# Patient Record
Sex: Female | Born: 1965
Health system: Southern US, Community
[De-identification: ages and names within clinical notes are randomized; demographics above are authoritative.]

## PROBLEM LIST (undated history)

## (undated) DIAGNOSIS — G5601 Carpal tunnel syndrome, right upper limb: Secondary | ICD-10-CM

## (undated) DIAGNOSIS — I517 Cardiomegaly: Secondary | ICD-10-CM

## (undated) DIAGNOSIS — J449 Chronic obstructive pulmonary disease, unspecified: Secondary | ICD-10-CM

## (undated) DIAGNOSIS — I10 Essential (primary) hypertension: Secondary | ICD-10-CM

## (undated) DIAGNOSIS — E119 Type 2 diabetes mellitus without complications: Secondary | ICD-10-CM

## (undated) DIAGNOSIS — IMO0001 Reserved for inherently not codable concepts without codable children: Secondary | ICD-10-CM

## (undated) DIAGNOSIS — E785 Hyperlipidemia, unspecified: Secondary | ICD-10-CM

## (undated) DIAGNOSIS — I739 Peripheral vascular disease, unspecified: Secondary | ICD-10-CM

## (undated) DIAGNOSIS — J40 Bronchitis, not specified as acute or chronic: Secondary | ICD-10-CM

## (undated) DIAGNOSIS — E669 Obesity, unspecified: Secondary | ICD-10-CM

## (undated) DIAGNOSIS — G4730 Sleep apnea, unspecified: Secondary | ICD-10-CM

## (undated) DIAGNOSIS — M1990 Unspecified osteoarthritis, unspecified site: Secondary | ICD-10-CM

## (undated) DIAGNOSIS — K047 Periapical abscess without sinus: Secondary | ICD-10-CM

## (undated) DIAGNOSIS — D649 Anemia, unspecified: Secondary | ICD-10-CM

## (undated) HISTORY — DX: Chronic obstructive pulmonary disease, unspecified: J44.9

## (undated) HISTORY — DX: Hyperlipidemia, unspecified: E78.5

## (undated) HISTORY — DX: Sleep apnea, unspecified: G47.30

## (undated) HISTORY — PX: DILATION AND CURETTAGE OF UTERUS: SHX78

## (undated) HISTORY — PX: TUBAL LIGATION: SHX77

## (undated) HISTORY — DX: Unspecified osteoarthritis, unspecified site: M19.90

## (undated) HISTORY — DX: Periapical abscess without sinus: K04.7

## (undated) HISTORY — DX: Obesity, unspecified: E66.9

## (undated) HISTORY — DX: Type 2 diabetes mellitus without complications: E11.9

---

## 1997-10-10 ENCOUNTER — Ambulatory Visit (HOSPITAL_COMMUNITY): Admission: RE | Admit: 1997-10-10 | Discharge: 1997-10-10 | Payer: Self-pay | Admitting: Family Medicine

## 1998-03-21 ENCOUNTER — Other Ambulatory Visit: Admission: RE | Admit: 1998-03-21 | Discharge: 1998-03-21 | Payer: Self-pay | Admitting: Internal Medicine

## 1998-04-21 ENCOUNTER — Ambulatory Visit (HOSPITAL_COMMUNITY): Admission: RE | Admit: 1998-04-21 | Discharge: 1998-04-21 | Payer: Self-pay | Admitting: *Deleted

## 1998-10-28 ENCOUNTER — Ambulatory Visit (HOSPITAL_COMMUNITY): Admission: RE | Admit: 1998-10-28 | Discharge: 1998-10-28 | Payer: Self-pay | Admitting: Family Medicine

## 1999-03-27 ENCOUNTER — Encounter: Payer: Self-pay | Admitting: Family Medicine

## 1999-03-27 ENCOUNTER — Ambulatory Visit (HOSPITAL_COMMUNITY): Admission: RE | Admit: 1999-03-27 | Discharge: 1999-03-27 | Payer: Self-pay | Admitting: Family Medicine

## 1999-04-13 ENCOUNTER — Ambulatory Visit (HOSPITAL_COMMUNITY): Admission: RE | Admit: 1999-04-13 | Discharge: 1999-04-13 | Payer: Self-pay | Admitting: Internal Medicine

## 1999-04-13 ENCOUNTER — Encounter: Payer: Self-pay | Admitting: Internal Medicine

## 1999-04-14 ENCOUNTER — Encounter: Payer: Self-pay | Admitting: Internal Medicine

## 1999-06-05 ENCOUNTER — Other Ambulatory Visit: Admission: RE | Admit: 1999-06-05 | Discharge: 1999-06-05 | Payer: Self-pay | Admitting: Family Medicine

## 2002-01-03 ENCOUNTER — Encounter: Payer: Self-pay | Admitting: Emergency Medicine

## 2002-01-03 ENCOUNTER — Emergency Department (HOSPITAL_COMMUNITY): Admission: EM | Admit: 2002-01-03 | Discharge: 2002-01-03 | Payer: Self-pay | Admitting: Emergency Medicine

## 2002-07-27 ENCOUNTER — Ambulatory Visit (HOSPITAL_COMMUNITY): Admission: RE | Admit: 2002-07-27 | Discharge: 2002-07-27 | Payer: Self-pay | Admitting: *Deleted

## 2002-07-27 ENCOUNTER — Encounter: Payer: Self-pay | Admitting: *Deleted

## 2002-09-28 ENCOUNTER — Ambulatory Visit (HOSPITAL_COMMUNITY): Admission: RE | Admit: 2002-09-28 | Discharge: 2002-09-28 | Payer: Self-pay | Admitting: Family Medicine

## 2002-12-03 ENCOUNTER — Encounter: Admission: RE | Admit: 2002-12-03 | Discharge: 2003-03-03 | Payer: Self-pay | Admitting: Internal Medicine

## 2004-11-07 ENCOUNTER — Emergency Department (HOSPITAL_COMMUNITY): Admission: EM | Admit: 2004-11-07 | Discharge: 2004-11-07 | Payer: Self-pay | Admitting: Family Medicine

## 2004-11-11 ENCOUNTER — Emergency Department (HOSPITAL_COMMUNITY): Admission: EM | Admit: 2004-11-11 | Discharge: 2004-11-11 | Payer: Self-pay | Admitting: Family Medicine

## 2005-03-04 ENCOUNTER — Emergency Department (HOSPITAL_COMMUNITY): Admission: EM | Admit: 2005-03-04 | Discharge: 2005-03-04 | Payer: Self-pay | Admitting: Emergency Medicine

## 2005-12-01 ENCOUNTER — Ambulatory Visit (HOSPITAL_COMMUNITY): Admission: RE | Admit: 2005-12-01 | Discharge: 2005-12-01 | Payer: Self-pay | Admitting: Family Medicine

## 2006-04-17 ENCOUNTER — Emergency Department (HOSPITAL_COMMUNITY): Admission: EM | Admit: 2006-04-17 | Discharge: 2006-04-17 | Payer: Self-pay | Admitting: Family Medicine

## 2006-10-14 ENCOUNTER — Ambulatory Visit: Payer: Self-pay | Admitting: Nurse Practitioner

## 2006-10-17 ENCOUNTER — Ambulatory Visit: Payer: Self-pay | Admitting: Nurse Practitioner

## 2006-10-19 ENCOUNTER — Ambulatory Visit (HOSPITAL_COMMUNITY): Admission: RE | Admit: 2006-10-19 | Discharge: 2006-10-19 | Payer: Self-pay | Admitting: Family Medicine

## 2006-10-24 ENCOUNTER — Encounter (INDEPENDENT_AMBULATORY_CARE_PROVIDER_SITE_OTHER): Payer: Self-pay | Admitting: Nurse Practitioner

## 2006-10-24 ENCOUNTER — Ambulatory Visit: Payer: Self-pay | Admitting: Nurse Practitioner

## 2006-12-05 ENCOUNTER — Ambulatory Visit (HOSPITAL_COMMUNITY): Admission: RE | Admit: 2006-12-05 | Discharge: 2006-12-05 | Payer: Self-pay | Admitting: Family Medicine

## 2007-08-01 ENCOUNTER — Emergency Department (HOSPITAL_COMMUNITY): Admission: EM | Admit: 2007-08-01 | Discharge: 2007-08-01 | Payer: Self-pay | Admitting: Emergency Medicine

## 2008-01-01 ENCOUNTER — Ambulatory Visit (HOSPITAL_COMMUNITY): Admission: RE | Admit: 2008-01-01 | Discharge: 2008-01-01 | Payer: Self-pay | Admitting: Family Medicine

## 2008-01-24 ENCOUNTER — Encounter: Admission: RE | Admit: 2008-01-24 | Discharge: 2008-01-24 | Payer: Self-pay | Admitting: Family Medicine

## 2008-06-18 ENCOUNTER — Emergency Department (HOSPITAL_COMMUNITY): Admission: EM | Admit: 2008-06-18 | Discharge: 2008-06-18 | Payer: Self-pay | Admitting: Emergency Medicine

## 2008-07-29 IMAGING — CR DG UGI W/ KUB
1 series · 1 of 1 positions shown · non-contrast
Comparison: none

CLINICAL DATA: Epigastric pain. 
UPPER GI SERIES WITH KUB:

[view not recorded]
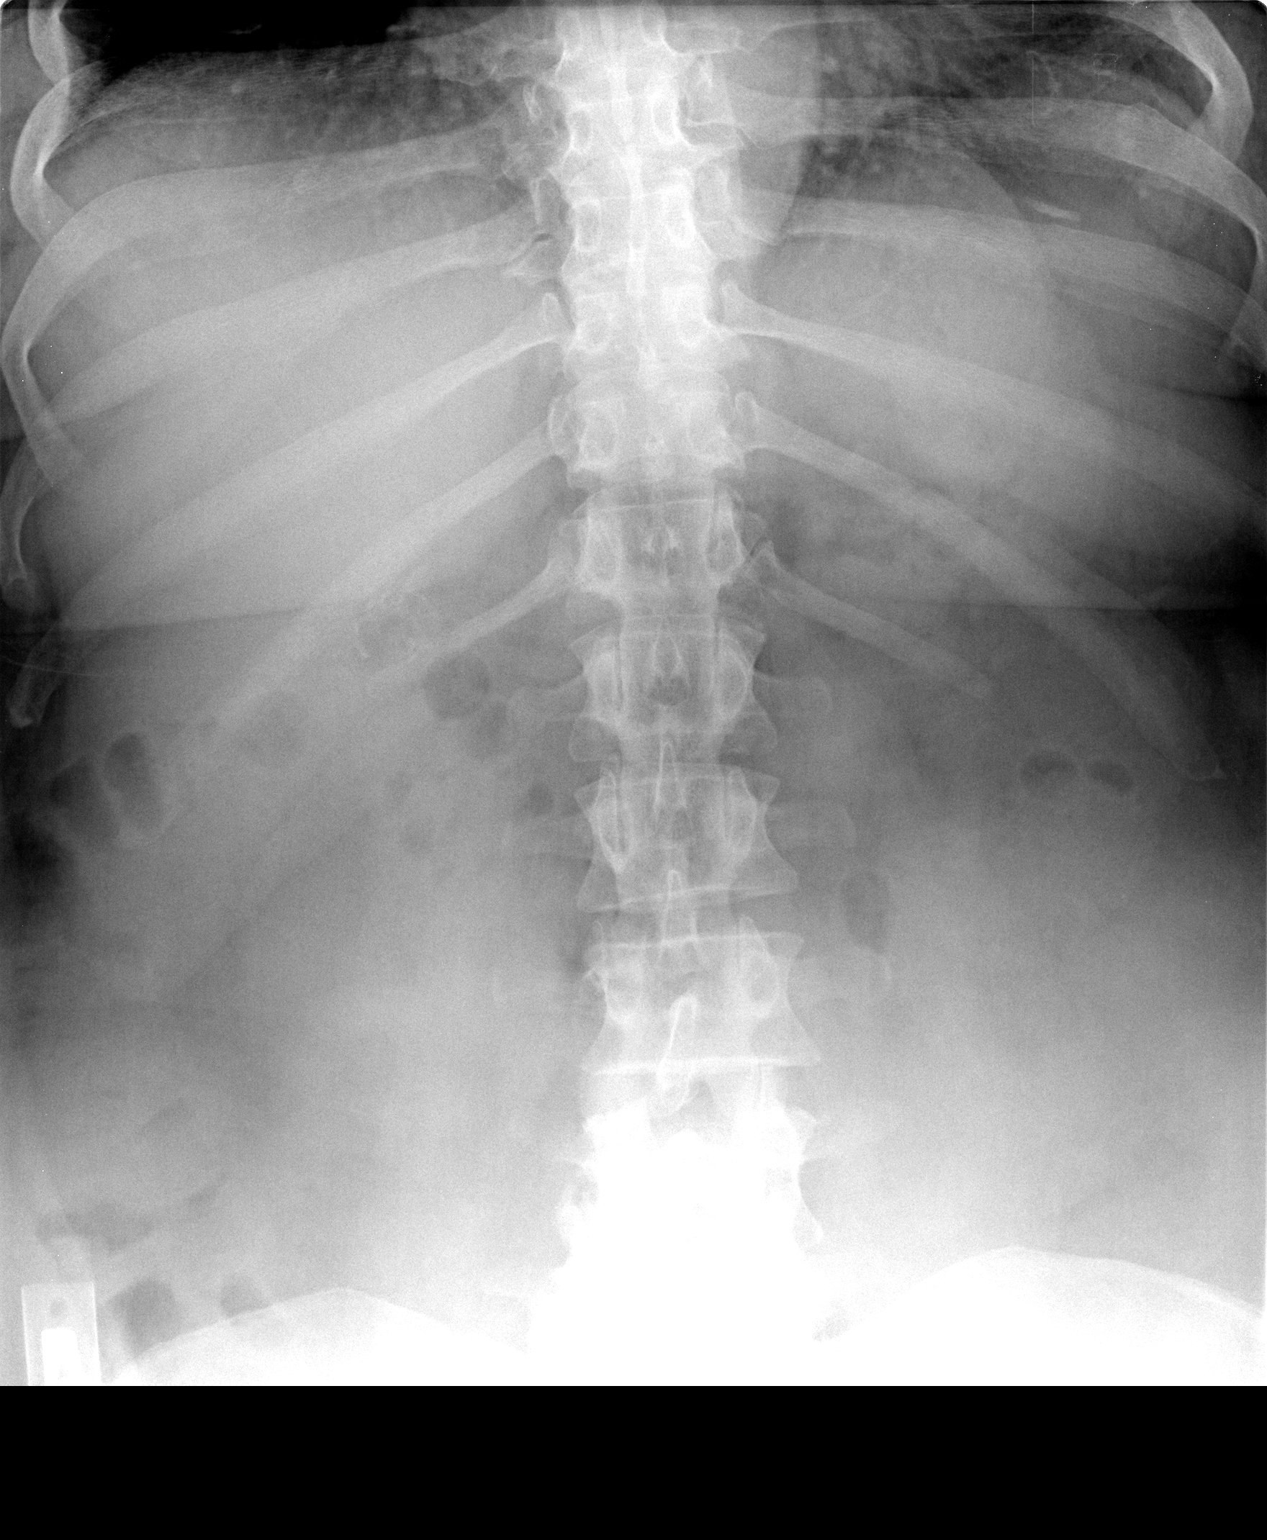

[1 of 1 positions shown; findings below may reference images not displayed]

FINDINGS: Preliminary scout film is unremarkable.  Esophageal motility and contour are normal.  No hiatal hernia or GE reflux.  There was some spasm of the proximal duodenum including the bulb and descending duodenum and the mucosal folds appear somewhat prominent in caliber as well.  Findings are compatible with peptic duodenitis.  Negative for ulcer.  Gastric mucosal folds appear somewhat prominent in caliber potentially representing gastritis, although the caliber of the folds may be within the range of normal.  No ulcer niche.
IMPRESSION: Findings compatible with peptic duodenitis and possibly gastritis as well.  No ulcer.

## 2008-08-19 ENCOUNTER — Encounter (INDEPENDENT_AMBULATORY_CARE_PROVIDER_SITE_OTHER): Payer: Self-pay | Admitting: Internal Medicine

## 2008-08-19 ENCOUNTER — Ambulatory Visit: Payer: Self-pay | Admitting: *Deleted

## 2008-08-19 ENCOUNTER — Ambulatory Visit: Payer: Self-pay | Admitting: Internal Medicine

## 2008-08-19 LAB — CONVERTED CEMR LAB
ALT: 19 units/L (ref 0–35)
Albumin: 3.8 g/dL (ref 3.5–5.2)
Alkaline Phosphatase: 116 units/L (ref 39–117)
CO2: 23 meq/L (ref 19–32)
Glucose, Bld: 104 mg/dL — ABNORMAL HIGH (ref 70–99)
Hemoglobin: 13.9 g/dL (ref 12.0–15.0)
Lymphocytes Relative: 39 % (ref 12–46)
Lymphs Abs: 3.5 10*3/uL (ref 0.7–4.0)
Monocytes Absolute: 0.5 10*3/uL (ref 0.1–1.0)
Monocytes Relative: 5 % (ref 3–12)
Neutro Abs: 4.6 10*3/uL (ref 1.7–7.7)
Potassium: 4.2 meq/L (ref 3.5–5.3)
RBC: 4.91 M/uL (ref 3.87–5.11)
Sodium: 139 meq/L (ref 135–145)
TSH: 3.031 microintl units/mL (ref 0.350–4.500)
Total Protein: 7.3 g/dL (ref 6.0–8.3)
WBC: 8.9 10*3/uL (ref 4.0–10.5)

## 2008-08-21 ENCOUNTER — Encounter: Admission: RE | Admit: 2008-08-21 | Discharge: 2008-08-21 | Payer: Self-pay | Admitting: Internal Medicine

## 2008-08-29 ENCOUNTER — Encounter (INDEPENDENT_AMBULATORY_CARE_PROVIDER_SITE_OTHER): Payer: Self-pay | Admitting: *Deleted

## 2008-09-25 ENCOUNTER — Ambulatory Visit: Payer: Self-pay | Admitting: Internal Medicine

## 2008-09-27 ENCOUNTER — Encounter (INDEPENDENT_AMBULATORY_CARE_PROVIDER_SITE_OTHER): Payer: Self-pay | Admitting: *Deleted

## 2009-02-07 ENCOUNTER — Telehealth (INDEPENDENT_AMBULATORY_CARE_PROVIDER_SITE_OTHER): Payer: Self-pay | Admitting: *Deleted

## 2009-02-12 ENCOUNTER — Ambulatory Visit: Payer: Self-pay | Admitting: Family Medicine

## 2009-08-22 ENCOUNTER — Ambulatory Visit (HOSPITAL_COMMUNITY): Admission: RE | Admit: 2009-08-22 | Discharge: 2009-08-22 | Payer: Self-pay | Admitting: Internal Medicine

## 2009-09-17 ENCOUNTER — Ambulatory Visit: Payer: Self-pay | Admitting: Family Medicine

## 2009-09-22 ENCOUNTER — Ambulatory Visit (HOSPITAL_COMMUNITY): Admission: RE | Admit: 2009-09-22 | Discharge: 2009-09-22 | Payer: Self-pay | Admitting: Internal Medicine

## 2009-10-08 ENCOUNTER — Ambulatory Visit: Payer: Self-pay | Admitting: Internal Medicine

## 2009-11-26 ENCOUNTER — Ambulatory Visit: Payer: Self-pay | Admitting: Internal Medicine

## 2009-11-26 LAB — CONVERTED CEMR LAB
AST: 12 units/L (ref 0–37)
Albumin: 3.7 g/dL (ref 3.5–5.2)
BUN: 13 mg/dL (ref 6–23)
CO2: 22 meq/L (ref 19–32)
Calcium: 9 mg/dL (ref 8.4–10.5)
Chloride: 104 meq/L (ref 96–112)
Cholesterol: 169 mg/dL (ref 0–200)
Creatinine, Ser: 0.64 mg/dL (ref 0.40–1.20)
Eosinophils Absolute: 0.4 10*3/uL (ref 0.0–0.7)
Eosinophils Relative: 6 % — ABNORMAL HIGH (ref 0–5)
Glucose, Bld: 80 mg/dL (ref 70–99)
HCT: 41.4 % (ref 36.0–46.0)
HDL: 42 mg/dL (ref >39)
Hemoglobin: 13.7 g/dL (ref 12.0–15.0)
Lymphocytes Relative: 40 % (ref 12–46)
Lymphs Abs: 2.4 10*3/uL (ref 0.7–4.0)
MCV: 86.6 fL (ref 78.0–100.0)
Monocytes Absolute: 0.5 10*3/uL (ref 0.1–1.0)
Platelets: 277 10*3/uL (ref 150–400)
Potassium: 4.1 meq/L (ref 3.5–5.3)
TSH: 3.042 microintl units/mL (ref 0.350–4.500)
Total CHOL/HDL Ratio: 4
Triglycerides: 75 mg/dL (ref <150)
WBC: 6.2 10*3/uL (ref 4.0–10.5)

## 2009-12-16 ENCOUNTER — Ambulatory Visit: Payer: Self-pay | Admitting: Internal Medicine

## 2010-06-06 ENCOUNTER — Emergency Department (HOSPITAL_COMMUNITY)
Admission: EM | Admit: 2010-06-06 | Discharge: 2010-06-06 | Payer: Self-pay | Source: Home / Self Care | Admitting: Emergency Medicine

## 2010-06-29 ENCOUNTER — Encounter: Payer: Self-pay | Admitting: Family Medicine

## 2010-08-17 LAB — GC/CHLAMYDIA PROBE AMP, GENITAL
Chlamydia, DNA Probe: NEGATIVE
GC Probe Amp, Genital: NEGATIVE

## 2010-08-17 LAB — POCT URINALYSIS DIPSTICK
Bilirubin Urine: NEGATIVE
Hgb urine dipstick: NEGATIVE
Ketones, ur: NEGATIVE mg/dL
Protein, ur: NEGATIVE mg/dL
Specific Gravity, Urine: 1.025 (ref 1.005–1.030)
pH: 6 (ref 5.0–8.0)

## 2010-08-17 LAB — WET PREP, GENITAL

## 2010-08-17 LAB — POCT PREGNANCY, URINE: Preg Test, Ur: NEGATIVE

## 2010-08-19 LAB — HM PAP SMEAR: HM Pap smear: NORMAL

## 2011-01-11 ENCOUNTER — Encounter: Payer: Self-pay | Admitting: *Deleted

## 2011-01-12 ENCOUNTER — Ambulatory Visit (HOSPITAL_COMMUNITY): Payer: BC Managed Care – PPO | Attending: Cardiology | Admitting: Radiology

## 2011-01-12 DIAGNOSIS — R0789 Other chest pain: Secondary | ICD-10-CM

## 2011-01-12 DIAGNOSIS — R0609 Other forms of dyspnea: Secondary | ICD-10-CM

## 2011-01-12 DIAGNOSIS — I4949 Other premature depolarization: Secondary | ICD-10-CM

## 2011-01-12 DIAGNOSIS — R079 Chest pain, unspecified: Secondary | ICD-10-CM | POA: Insufficient documentation

## 2011-01-12 DIAGNOSIS — R0989 Other specified symptoms and signs involving the circulatory and respiratory systems: Secondary | ICD-10-CM

## 2011-01-12 MED ORDER — TECHNETIUM TC 99M TETROFOSMIN IV KIT
33.0000 | PACK | Freq: Once | INTRAVENOUS | Status: AC | PRN
  Administered 2011-01-12: 33 via INTRAVENOUS

## 2011-01-12 NOTE — Progress Notes (Signed)
[facility] SITE 3 NUCLEAR MED [address] [place] [address] [phone]  Cardiology Nuclear Med Study  Lorraine Holt is a 45 y.o. female [id number] 08-22-1965   Nuclear Med Background Indication for Stress Test:  Evaluation for Ischemia History:  No previous documented CAD Cardiac Risk Factors: Family History - CAD, Hypertension, NIDDM, Obesity and Smoker  Symptoms: Chest pain/ Chest Tightness with and without Exertion (last date of chest discomfort at present subsided after 1 minute), Diaphoresis, Dizziness, Fatigue, Fatigue with Exertion, Light-Headedness, Nausea, Near Syncope, Palpitations and SOB   Nuclear Pre-Procedure Caffeine/Decaff Intake:  None NPO After: 500pm   Lungs:  Clear IV 0.9% NS with Angio Cath:  20g  IV Site: R Wrist  IV Started by:  Cathlyn Parsons, RN  Chest Size (in):  32 Cup Size: D  Height: 5\' 7"  (1.702 m)  Weight:  291 lb (131.997 kg)  BMI:  Body mass index is 45.58 kg/(m^2). Tech Comments:  na    Nuclear Med Study 1 or 2 day study: 2 day  Stress Test Type:  Stress  Reading MD: Willa Rough MD  Order Authorizing Provider:  Georganna Skeans, MD  Resting Radionuclide: Technetium 66m Tetrofosmin  Resting Radionuclide Dose: 33 mCi   Stress Radionuclide:  Technetium 46m Tetrofosmin  Stress Radionuclide Dose: 33 mCi           Stress Protocol Rest HR: 71 Stress HR: 148  Rest BP: 105/79 Stress BP: 170/94  Exercise Time (min): 6:01 METS: 7.0   Predicted Max HR: 173 bpm % Max HR: 85.55 bpm Rate Pressure Product: 10272   Dose of Adenosine (mg):  n/a Dose of Lexiscan: n/a mg  Dose of Atropine (mg): n/a Dose of Dobutamine: n/a mcg/kg/min (at max HR)  Stress Test Technologist: Irean Hong, RN  Nuclear Technologist:  Domenic Polite, CNMT     Rest Procedure:  Myocardial perfusion imaging was performed at rest 45 minutes following the intravenous administration of Technetium 1m Tetrofosmin. Rest ECG: NSR, Bigeminy PVC's, PJC's,  and nonconducted PJC's.  Stress Procedure:  The patient exercised for 6 minutes and 01 second, RPE=15. The patient stopped due to DOE, and Fatigue and denied any chest pain.  There were no significant ST-T wave changes. There were frequent PJC's, and rare PVC.  Technetium 69m Tetrofosmin was injected at peak exercise and myocardial perfusion imaging was performed after a brief delay. Stress ECG: No significant change from baseline ECG  QPS Raw Data Images:  Patient motion noted; appropriate software correction applied. Stress Images:  The images are noisey, but there is no diagnostic abnormality. Rest Images:  Same as stress Subtraction (SDS):  No evidence of ischemia. Transient Ischemic Dilatation (Normal <1.22):  1.29  Lung/Heart Ratio (Normal <0.45):  .39  Quantitative Gated Spect Images QGS EDV:  126 ml QGS ESV:  64 ml QGS cine images:  No sure how reliable the LV function analysis is on this study. QGS EF: 49%  Impression Exercise Capacity:  Fair exercise capacity. BP Response:  Normal blood pressure response. Clinical Symptoms:  DOE ECG Impression:  No significant ST segment change suggestive of ischemia. Comparison with Prior Nuclear Study: No previous nuclear study performed  Overall Impression:  Normal stress nuclear study.  Willa Rough

## 2011-01-13 ENCOUNTER — Ambulatory Visit (HOSPITAL_COMMUNITY): Payer: BC Managed Care – PPO | Attending: Family Medicine | Admitting: Radiology

## 2011-01-13 DIAGNOSIS — R0989 Other specified symptoms and signs involving the circulatory and respiratory systems: Secondary | ICD-10-CM

## 2011-01-13 MED ORDER — TECHNETIUM TC 99M TETROFOSMIN IV KIT
33.0000 | PACK | Freq: Once | INTRAVENOUS | Status: AC | PRN
  Administered 2011-01-13: 33 via INTRAVENOUS

## 2011-01-14 NOTE — Progress Notes (Signed)
nuc med report routed to Georganna Skeans 01/14/11 Domenic Polite

## 2011-12-29 ENCOUNTER — Emergency Department (HOSPITAL_COMMUNITY)
Admission: EM | Admit: 2011-12-29 | Discharge: 2011-12-29 | Disposition: A | Discharge status: Home / Self Care | Payer: 59 | Source: Home / Self Care | Attending: Family Medicine | Admitting: Family Medicine

## 2011-12-29 ENCOUNTER — Encounter (HOSPITAL_COMMUNITY): Payer: Self-pay

## 2011-12-29 DIAGNOSIS — H01009 Unspecified blepharitis unspecified eye, unspecified eyelid: Secondary | ICD-10-CM

## 2011-12-29 DIAGNOSIS — H01006 Unspecified blepharitis left eye, unspecified eyelid: Secondary | ICD-10-CM

## 2011-12-29 HISTORY — DX: Essential (primary) hypertension: I10

## 2011-12-29 MED ORDER — PREDNISONE 20 MG PO TABS: ORAL_TABLET | ORAL | Status: AC

## 2011-12-29 MED ORDER — HYDROXYZINE HCL 25 MG PO TABS: 25.0000 mg | ORAL_TABLET | Freq: Four times a day (QID) | ORAL | Status: AC | PRN

## 2011-12-29 MED ORDER — ERYTHROMYCIN 5 MG/GM OP OINT: TOPICAL_OINTMENT | OPHTHALMIC | Status: AC

## 2011-12-29 NOTE — ED Notes (Signed)
C/o lt upper eyelid redness, swelling and itching.  States eye tearing frequently.  Sx started yesterday.  BP elevated here today, has not taken her BP med in 2 days.

## 2011-12-30 NOTE — ED Provider Notes (Signed)
History     CSN: [id number]  Arrival date & time 12/29/11  1110   First MD Initiated Contact with Patient 12/29/11 1126      Chief Complaint  Patient presents with  . Eye Problem    (Consider location/radiation/quality/duration/timing/severity/associated sxs/prior treatment) HPI Comments: 46 year old obese female with history of hypertension diabetes. Here complaining of left upper eyelid swelling and itchiness for 24 hours. Symptoms associated with constant eye tearing. Not known traumas. Denies foreign body sensation. Patient does not wear contacts. Denies eye pain. Not known exposures. Not taking any medications for her symptoms. On the other hand patient was found hypotensive with a systolic blood pressure in the 170 and diastolic 118. She states that she has not taken her blood pressure medications in the last 2 days sometimes she forgets take them before leaving the house in the morning. Denies headache, visual changes, chest pain, shortness of breath. Patient reports well other than the symptoms stated above.   Past Medical History  Diagnosis Date  . Hypertension   . Diabetes mellitus     Past Surgical History  Procedure Date  . Tubal ligation     No family history on file.  History  Substance Use Topics  . Smoking status: Current Everyday Smoker -- 0.5 packs/day  . Smokeless tobacco: Not on file  . Alcohol Use: No    OB History    Grav Para Term Preterm Abortions TAB SAB Ect Mult Living                  Review of Systems  Constitutional: Negative for fever, chills and appetite change.  HENT: Negative for congestion, sore throat, rhinorrhea, sneezing and sinus pressure.   Eyes: Positive for itching. Negative for photophobia, pain and visual disturbance.       Left Eye tearing as per history of present illness  Respiratory: Negative for cough and shortness of breath.   Cardiovascular: Negative for chest pain.  Gastrointestinal: Negative for nausea, vomiting  and abdominal pain.  Genitourinary: Negative for dysuria and hematuria.  Skin: Negative for rash.  Neurological: Negative for dizziness, tremors, seizures, syncope, speech difficulty, weakness, numbness and headaches.    Allergies  Penicillins  Home Medications   Current Outpatient Rx  Name Route Sig Dispense Refill  . LISINOPRIL 10 MG PO TABS Oral Take 10 mg by mouth daily.    Marland Kitchen METFORMIN HCL 500 MG PO TABS Oral Take 500 mg by mouth 2 (two) times daily with a meal.    . PRAVASTATIN SODIUM 10 MG PO TABS Oral Take 10 mg by mouth daily.    . ERYTHROMYCIN 5 MG/GM OP OINT  Place a 1/2 inch ribbon of ointment into the left lower eyelid QID for 5 days. 1 g 0  . HYDROXYZINE HCL 25 MG PO TABS Oral Take 1 tablet (25 mg total) by mouth every 6 (six) hours as needed for itching. 20 tablet 0  . PREDNISONE 20 MG PO TABS  2 tabs po daily for 3 days 6 tablet 0    BP 158/100  Pulse 84  Temp 98.7 F (37.1 C) (Oral)  Resp 20  SpO2 98%  LMP 12/21/2011  Physical Exam  Nursing note and vitals reviewed. Constitutional: She is oriented to person, place, and time. She appears well-developed and well-nourished. No distress.  HENT:  Head: Normocephalic and atraumatic.  Right Ear: External ear normal.  Left Ear: External ear normal.  Nose: Nose normal.  Mouth/Throat: Oropharynx is clear and moist. No  oropharyngeal exudate.  Eyes: Conjunctivae and EOM are normal. Pupils are equal, round, and reactive to light. No scleral icterus.       Right eye normal examination.  Left side: There is mild to moderate translucent swelling with no significant erythema in the left upper eyelid. No crusting or exudates. Sclera eye tearing. Conjunctiva appears normal without  chemosis, erythema or exudates. No peri-orbital edema or erythema. No pain reported with extraocular movements there appeared intact.   Neck: Normal range of motion. Neck supple. No JVD present. No thyromegaly present.  Cardiovascular: Normal rate,  regular rhythm and normal heart sounds.   Pulmonary/Chest: Effort normal and breath sounds normal. No respiratory distress. She has no wheezes. She has no rales. She exhibits no tenderness.  Lymphadenopathy:    She has no cervical adenopathy.  Neurological: She is alert and oriented to person, place, and time.  Skin: No rash noted. She is not diaphoretic.    ED Course  Procedures (including critical care time)  EKG: Normal sinus rhythm. Ventricular rate 75 beats per minutes. No ST or other acute ischemic changes.  Labs Reviewed - No data to display No results found.   1. Blepharitis of left eye       MDM  Impress allergic blepharitis. Prescribe prednisone for 3 days. Hydroxyzine. Prophylactic treatment with erythromycin ophthalmic ointment. Patient also here with significant hypertension reporting known compliance with her medications EKG was normal. Patient asymptomatic. Discharge blood pressure was 158/100. Patient was encouraged to take her medications daily. She is aware of also of comments of untreated hypertension especially in a diabetic smoker individual. Asked to go to the emergency department if new symptoms like chest pain, headache.         Sharin Grave, MD 12/30/11 1046

## 2012-01-22 ENCOUNTER — Encounter: Payer: Self-pay | Admitting: *Deleted

## 2012-01-22 ENCOUNTER — Encounter: Payer: 59 | Attending: Family Medicine | Admitting: *Deleted

## 2012-01-22 VITALS — Ht 67.5 in | Wt 310.3 lb

## 2012-01-22 DIAGNOSIS — E119 Type 2 diabetes mellitus without complications: Secondary | ICD-10-CM

## 2012-01-22 NOTE — Patient Instructions (Addendum)
Goals:  Follow Diabetes Meal Plan as instructed  Eat 3 meals and 2 snacks, every 3-5 hrs  Limit carbohydrate intake to 45-60 grams carbohydrate/meal  Limit carbohydrate intake to 0-30 grams carbohydrate/snack  Add lean protein foods to meals/snacks  Monitor glucose levels as instructed by your doctor  Aim for 16-30 mins of physical activity daily as tolerated  Bring food record and glucose log to your next nutrition visit

## 2012-01-22 NOTE — Progress Notes (Signed)
Patient was seen on 01/22/12 for the complete series of three diabetes self-management courses at the [facility].  Current A1c = 6.3%  The following learning objectives were met by the patient during this course:  Core 1:  Gain an understanding of diabetes and what causes it  Learn how diabetes is treated and the goals of treatment  Learn why, how and when to test BG  Learn how carbohydrate affects your glucose  Receive a personal food plan and learn how to count carbohydrates  Discover how physical activity enhances glucose control and overall health  Begin to gain confidence in your ability to manage diabetes Handouts given during this class include:  Type 2 Diabetes: Basics Book  My Food Plan Book  Food and Activity Log Core 2:  Describe causes, symptoms and treatment of hypoglycemia and hyperglycemia Learn how to care for your glucose meter and strips Explain how to manage diabetes during illness  List strategies to follow meal plan when dining out  Describe the effects of alcohol on glucose and how to use it safely  Describe problem solving skills for day-to-day glucose challenges  Describe ways to remain physically active  Describe the impact of regular activity on insulin resistance Handouts given in this class:  Refrigerator magnet for Sick Day Guidelines  [facility] Oral Medication and Insulin handout Core 3  Describe how diabetes changes over time  Understand why glucose may be out of target  Learn how diabetes changes over time  Learn about blood pressure, cholesterol, and heart health  Learn about lowering dietary fat and sodium  Understand the benefits of physical activity for heart health Develop problem solving skills for times when glucose numbers are puzzling Develop strategies for creating life balance  Learn how to identify if your treatment plan needs to change  Develop strategies for dealing with stress, depression and staying motivated   Identify healthy weight-loss plans  Gain confidence that you can succeed in caring for your diabetes  Establish 2-3 goals that they will plan to diligently work on until they return  for the free 44-month follow-up visit  The following handouts were given in class:  3 Month Follow Up Visit handout  Goal setting handout  Class evaluation form Your patient has established the following 3 month goals for diabetes self-care:  Count carbohydrates at most meals and snacks  Take diabetes medication as scheduled  To help manage my stress, I will walk at least 3 times a week Follow-Up Plan:  Patient was offered a 3 month follow-up visit for diabetes self-management education.

## 2012-01-22 NOTE — Progress Notes (Signed)
Patient was seen on 01/22/12 for the complete series of three diabetes self-management courses at the [facility].  Current A1c = 6.3%  The following learning objectives were met by the patient during this course:  Core 1:  Gain an understanding of diabetes and what causes it  Learn how diabetes is treated and the goals of treatment  Learn why, how and when to test BG  Learn how carbohydrate affects your glucose  Receive a personal food plan and learn how to count carbohydrates  Discover how physical activity enhances glucose control and overall health  Begin to gain confidence in your ability to manage diabetes Handouts given during this class include:  Type 2 Diabetes: Basics Book  My Food Plan Book  Food and Activity Log Core 2:  Describe causes, symptoms and treatment of hypoglycemia and hyperglycemia Learn how to care for your glucose meter and strips Explain how to manage diabetes during illness  List strategies to follow meal plan when dining out  Describe the effects of alcohol on glucose and how to use it safely  Describe problem solving skills for day-to-day glucose challenges  Describe ways to remain physically active  Describe the impact of regular activity on insulin resistance Handouts given in this class:  Refrigerator magnet for Sick Day Guidelines  [facility] Oral Medication and Insulin handout Core 3  Describe how diabetes changes over time  Understand why glucose may be out of target  Learn how diabetes changes over time  Learn about blood pressure, cholesterol, and heart health  Learn about lowering dietary fat and sodium  Understand the benefits of physical activity for heart health Develop problem solving skills for times when glucose numbers are puzzling Develop strategies for creating life balance  Learn how to identify if your treatment plan needs to change  Develop strategies for dealing with stress, depression and staying motivated   Identify healthy weight-loss plans  Gain confidence that you can succeed in caring for your diabetes  Establish 2-3 goals that they will plan to diligently work on until they return  for the free 3-month follow-up visit  The following handouts were given in class:  3 Month Follow Up Visit handout  Goal setting handout  Class evaluation form Your patient has established the following 3 month goals for diabetes self-care:  Count carbohydrates at most meals and snacks  Take diabetes medication as scheduled  To help manage my stress, I will walk at least 3 times a week Follow-Up Plan:  Patient was offered a 3 month follow-up visit for diabetes self-management education.    

## 2012-02-21 ENCOUNTER — Ambulatory Visit (INDEPENDENT_AMBULATORY_CARE_PROVIDER_SITE_OTHER)
Admission: RE | Admit: 2012-02-21 | Discharge: 2012-02-21 | Disposition: A | Discharge status: Home / Self Care | Payer: 59 | Source: Ambulatory Visit | Attending: Internal Medicine | Admitting: Internal Medicine

## 2012-02-21 ENCOUNTER — Encounter: Payer: Self-pay | Admitting: Internal Medicine

## 2012-02-21 ENCOUNTER — Other Ambulatory Visit (INDEPENDENT_AMBULATORY_CARE_PROVIDER_SITE_OTHER): Payer: 59

## 2012-02-21 ENCOUNTER — Ambulatory Visit (INDEPENDENT_AMBULATORY_CARE_PROVIDER_SITE_OTHER): Payer: 59 | Admitting: Internal Medicine

## 2012-02-21 VITALS — BP 140/100 | HR 105 | Temp 98.8°F | Resp 16 | Ht 67.0 in | Wt 310.2 lb

## 2012-02-21 DIAGNOSIS — I10 Essential (primary) hypertension: Secondary | ICD-10-CM

## 2012-02-21 DIAGNOSIS — IMO0001 Reserved for inherently not codable concepts without codable children: Secondary | ICD-10-CM

## 2012-02-21 DIAGNOSIS — R05 Cough: Secondary | ICD-10-CM | POA: Insufficient documentation

## 2012-02-21 DIAGNOSIS — J209 Acute bronchitis, unspecified: Secondary | ICD-10-CM

## 2012-02-21 DIAGNOSIS — R059 Cough, unspecified: Secondary | ICD-10-CM

## 2012-02-21 DIAGNOSIS — E118 Type 2 diabetes mellitus with unspecified complications: Secondary | ICD-10-CM | POA: Insufficient documentation

## 2012-02-21 LAB — COMPREHENSIVE METABOLIC PANEL
AST: 20 U/L (ref 0–37)
Albumin: 3.4 g/dL — ABNORMAL LOW (ref 3.5–5.2)
BUN: 9 mg/dL (ref 6–23)
Calcium: 9 mg/dL (ref 8.4–10.5)
Chloride: 101 mEq/L (ref 96–112)
Glucose, Bld: 143 mg/dL — ABNORMAL HIGH (ref 70–99)
Potassium: 4.8 mEq/L (ref 3.5–5.1)

## 2012-02-21 LAB — CBC WITH DIFFERENTIAL/PLATELET
Lymphocytes Relative: 35.6 % (ref 12.0–46.0)
Lymphs Abs: 1.7 10*3/uL (ref 0.7–4.0)
MCHC: 32.3 g/dL (ref 30.0–36.0)
Monocytes Absolute: 0.6 10*3/uL (ref 0.1–1.0)
Neutro Abs: 2.3 10*3/uL (ref 1.4–7.7)
Neutrophils Relative %: 49.6 % (ref 43.0–77.0)
Platelets: 236 10*3/uL (ref 150.0–400.0)
RBC: 5.15 Mil/uL — ABNORMAL HIGH (ref 3.87–5.11)

## 2012-02-21 LAB — URINALYSIS, ROUTINE W REFLEX MICROSCOPIC
Specific Gravity, Urine: <=1.005 (ref 1.000–1.030)
Total Protein, Urine: NEGATIVE
Urine Glucose: NEGATIVE
pH: 6 (ref 5.0–8.0)

## 2012-02-21 LAB — LIPID PANEL
Cholesterol: 161 mg/dL (ref 0–200)
Total CHOL/HDL Ratio: 4
Triglycerides: 102 mg/dL (ref 0.0–149.0)

## 2012-02-21 LAB — HM DIABETES EYE EXAM: HM Diabetic Eye Exam: NORMAL

## 2012-02-21 MED ORDER — AMLODIPINE-OLMESARTAN 10-40 MG PO TABS: 1.0000 | ORAL_TABLET | Freq: Every day | ORAL | Status: DC

## 2012-02-21 MED ORDER — HYDROCODONE-HOMATROPINE 5-1.5 MG/5ML PO SYRP: 5.0000 mL | ORAL_SOLUTION | Freq: Three times a day (TID) | ORAL | Status: DC | PRN

## 2012-02-21 MED ORDER — AZITHROMYCIN 500 MG PO TABS: 500.0000 mg | ORAL_TABLET | Freq: Every day | ORAL | Status: DC

## 2012-02-21 NOTE — Assessment & Plan Note (Signed)
CXR today to look for pna, mass, edema

## 2012-02-21 NOTE — Assessment & Plan Note (Signed)
I will check her a1c and will monitor her renal function 

## 2012-02-21 NOTE — Assessment & Plan Note (Signed)
Her BP is not well controlled so I have asked her to change to azor, today I will check her lytes and renal function

## 2012-02-21 NOTE — Patient Instructions (Addendum)
Acute Bronchitis You have acute bronchitis. This means you have a chest cold. The airways in your lungs are red and sore (inflamed). Acute means it is sudden onset.  CAUSES Bronchitis is most often caused by the same virus that causes a cold. SYMPTOMS   Body aches.   Chest congestion.   Chills.   Cough.   Fever.   Shortness of breath.   Sore throat.  TREATMENT  Acute bronchitis is usually treated with rest, fluids, and medicines for relief of fever or cough. Most symptoms should go away after a few days or a week. Increased fluids may help thin your secretions and will prevent dehydration. Your caregiver may give you an inhaler to improve your symptoms. The inhaler reduces shortness of breath and helps control cough. You can take over-the-counter pain relievers or cough medicine to decrease coughing, pain, or fever. A cool-air vaporizer may help thin bronchial secretions and make it easier to clear your chest. Antibiotics are usually not needed but can be prescribed if you smoke, are seriously ill, have chronic lung problems, are elderly, or you are at higher risk for developing complications.Allergies and asthma can make bronchitis worse. Repeated episodes of bronchitis may cause longstanding lung problems. Avoid smoking and secondhand smoke.Exposure to cigarette smoke or irritating chemicals will make bronchitis worse. If you are a cigarette smoker, consider using nicotine gum or skin patches to help control withdrawal symptoms. Quitting smoking will help your lungs heal faster. Recovery from bronchitis is often slow, but you should start feeling better after 2 to 3 days. Cough from bronchitis frequently lasts for 3 to 4 weeks. To prevent another bout of acute bronchitis:  Quit smoking.   Wash your hands frequently to get rid of viruses or use a hand sanitizer.   Avoid other people with cold or virus symptoms.   Try not to touch your hands to your mouth, nose, or eyes.  SEEK  IMMEDIATE MEDICAL CARE IF:  You develop increased fever, chills, or chest pain.   You have severe shortness of breath or bloody sputum.   You develop dehydration, fainting, repeated vomiting, or a severe headache.   You have no improvement after 1 week of treatment or you get worse.  MAKE SURE YOU:   Understand these instructions.   Will watch your condition.   Will get help right away if you are not doing well or get worse.  Document Released: 07/01/2004 Document Revised: 05/13/2011 Document Reviewed: 09/16/2010 [facility] Patient Information 2012 [place], [place].Hypertension As your heart beats, it forces blood through your arteries. This force is your blood pressure. If the pressure is too high, it is called hypertension (HTN) or high blood pressure. HTN is dangerous because you may have it and not know it. High blood pressure may mean that your heart has to work harder to pump blood. Your arteries may be narrow or stiff. The extra work puts you at risk for heart disease, stroke, and other problems.  Blood pressure consists of two numbers, a higher number over a lower, 110/72, for example. It is stated as "110 over 72." The ideal is below 120 for the top number (systolic) and under 80 for the bottom (diastolic). Write down your blood pressure today. You should pay close attention to your blood pressure if you have certain conditions such as:  Heart failure.   Prior heart attack.   Diabetes   Chronic kidney disease.   Prior stroke.   Multiple risk factors for heart disease.  To  see if you have HTN, your blood pressure should be measured while you are seated with your arm held at the level of the heart. It should be measured at least twice. A one-time elevated blood pressure reading (especially in the Emergency Department) does not mean that you need treatment. There may be conditions in which the blood pressure is different between your right and left arms. It is important to see  your caregiver soon for a recheck. Most people have essential hypertension which means that there is not a specific cause. This type of high blood pressure may be lowered by changing lifestyle factors such as:  Stress.   Smoking.   Lack of exercise.   Excessive weight.   Drug/tobacco/alcohol use.   Eating less salt.  Most people do not have symptoms from high blood pressure until it has caused damage to the body. Effective treatment can often prevent, delay or reduce that damage. TREATMENT  When a cause has been identified, treatment for high blood pressure is directed at the cause. There are a large number of medications to treat HTN. These fall into several categories, and your caregiver will help you select the medicines that are best for you. Medications may have side effects. You should review side effects with your caregiver. If your blood pressure stays high after you have made lifestyle changes or started on medicines,   Your medication(s) may need to be changed.   Other problems may need to be addressed.   Be certain you understand your prescriptions, and know how and when to take your medicine.   Be sure to follow up with your caregiver within the time frame advised (usually within two weeks) to have your blood pressure rechecked and to review your medications.   If you are taking more than one medicine to lower your blood pressure, make sure you know how and at what times they should be taken. Taking two medicines at the same time can result in blood pressure that is too low.  SEEK IMMEDIATE MEDICAL CARE IF:  You develop a severe headache, blurred or changing vision, or confusion.   You have unusual weakness or numbness, or a faint feeling.   You have severe chest or abdominal pain, vomiting, or breathing problems.  MAKE SURE YOU:   Understand these instructions.   Will watch your condition.   Will get help right away if you are not doing well or get worse.    Document Released: 05/24/2005 Document Revised: 05/13/2011 Document Reviewed: 01/12/2008 [facility] Patient Information 2012 [place], [place].

## 2012-02-21 NOTE — Progress Notes (Signed)
Subjective:    Patient ID: Lorraine Holt, female    DOB: [birth year], 46 y.o.   MRN: [id number]  Cough This is a new problem. The current episode started in the past 7 days. The problem has been gradually improving. The problem occurs every few hours. The cough is productive of purulent sputum. Associated symptoms include chills, a fever, a sore throat and sweats. Pertinent negatives include no chest pain, ear congestion, ear pain, headaches, heartburn, hemoptysis, myalgias, nasal congestion, postnasal drip, rash, rhinorrhea, shortness of breath, weight loss or wheezing. Nothing aggravates the symptoms. She has tried nothing for the symptoms.  Hypertension This is a chronic problem. The current episode started more than 1 year ago. The problem has been gradually worsening since onset. The problem is uncontrolled. Associated symptoms include sweats. Pertinent negatives include no anxiety, blurred vision, chest pain, headaches, malaise/fatigue, neck pain, orthopnea, palpitations, peripheral edema, PND or shortness of breath. Past treatments include ACE inhibitors. The current treatment provides no improvement. Compliance problems include exercise and diet.       Review of Systems  Constitutional: Positive for fever and chills. Negative for weight loss, malaise/fatigue, diaphoresis, activity change, appetite change, fatigue and unexpected weight change.  HENT: Positive for sore throat. Negative for ear pain, nosebleeds, congestion, facial swelling, rhinorrhea, sneezing, mouth sores, trouble swallowing, neck pain, voice change, postnasal drip and sinus pressure.   Eyes: Negative.  Negative for blurred vision.  Respiratory: Positive for cough. Negative for apnea, hemoptysis, choking, chest tightness, shortness of breath, wheezing and stridor.   Cardiovascular: Negative for chest pain, palpitations, orthopnea, leg swelling and PND.  Gastrointestinal: Negative for heartburn, nausea, abdominal pain,  diarrhea, constipation and blood in stool.  Genitourinary: Negative.   Musculoskeletal: Negative for myalgias, back pain, joint swelling, arthralgias and gait problem.  Skin: Negative for color change, pallor, rash and wound.  Neurological: Negative for dizziness, tremors, seizures, syncope, facial asymmetry, speech difficulty, weakness, light-headedness, numbness and headaches.  Hematological: Negative for adenopathy. Does not bruise/bleed easily.  Psychiatric/Behavioral: Negative.        Objective:   Physical Exam  Vitals reviewed. Constitutional: She is oriented to person, place, and time. Vital signs are normal. She appears well-developed and well-nourished.  Non-toxic appearance. She does not have a sickly appearance. She does not appear ill. No distress.  HENT:  Head: Normocephalic and atraumatic.  Mouth/Throat: Oropharynx is clear and moist. No oropharyngeal exudate.  Eyes: Conjunctivae normal are normal. Right eye exhibits no discharge. Left eye exhibits no discharge. No scleral icterus.  Neck: Normal range of motion. Neck supple. No JVD present. No tracheal deviation present. No thyromegaly present.  Cardiovascular: Normal rate, regular rhythm, normal heart sounds and intact distal pulses.  Exam reveals no gallop and no friction rub.   No murmur heard. Pulmonary/Chest: Effort normal and breath sounds normal. No stridor. No respiratory distress. She has no wheezes. She has no rales. She exhibits no tenderness.  Abdominal: Soft. Bowel sounds are normal. She exhibits no distension and no mass. There is no tenderness. There is no rebound and no guarding.  Musculoskeletal: Normal range of motion. She exhibits no edema and no tenderness.  Lymphadenopathy:    She has no cervical adenopathy.  Neurological: She is oriented to person, place, and time.  Skin: Skin is warm and dry. No rash noted. She is not diaphoretic. No erythema. No pallor.  Psychiatric: She has a normal mood and affect.  Her behavior is normal. Judgment and thought content normal.  Lab Results  Component Value Date   WBC 6.2 11/26/2009   HGB 13.7 11/26/2009   HCT 41.4 11/26/2009   PLT 277 11/26/2009   GLUCOSE 80 11/26/2009   CHOL 169 11/26/2009   TRIG 75 11/26/2009   HDL 42 11/26/2009   LDLCALC 112* 11/26/2009   ALT 9 11/26/2009   AST 12 11/26/2009   NA 137 11/26/2009   K 4.1 11/26/2009   CL 104 11/26/2009   CREATININE 0.64 11/26/2009   BUN 13 11/26/2009   CO2 22 11/26/2009   TSH 3.042 11/26/2009   HGBA1C 6.6* 11/26/2009       Assessment & Plan:

## 2012-02-21 NOTE — Assessment & Plan Note (Signed)
Start zpak for the infection and a cough suppressant 

## 2012-02-23 ENCOUNTER — Telehealth: Payer: Self-pay

## 2012-02-23 NOTE — Telephone Encounter (Signed)
Note provided to pt.  

## 2012-02-24 ENCOUNTER — Emergency Department (HOSPITAL_COMMUNITY)
Admission: EM | Admit: 2012-02-24 | Discharge: 2012-02-24 | Disposition: A | Discharge status: Home / Self Care | Payer: 59 | Source: Home / Self Care | Attending: Emergency Medicine | Admitting: Emergency Medicine

## 2012-02-24 ENCOUNTER — Encounter (HOSPITAL_COMMUNITY): Payer: Self-pay | Admitting: Emergency Medicine

## 2012-02-24 DIAGNOSIS — T50905A Adverse effect of unspecified drugs, medicaments and biological substances, initial encounter: Secondary | ICD-10-CM

## 2012-02-24 DIAGNOSIS — T887XXA Unspecified adverse effect of drug or medicament, initial encounter: Secondary | ICD-10-CM

## 2012-02-24 HISTORY — DX: Bronchitis, not specified as acute or chronic: J40

## 2012-02-24 NOTE — ED Notes (Signed)
HERE WITH C/O 2 DYS  OF light head,dizziness after Bp med changed on Monday .states BP med was changed from lisinopril 10 mg daily to amlodipine 10 mg daily.also reports she was sick with bronchitis but is feeling much better.pt is diabetic,htn.cbg this am 97

## 2012-02-24 NOTE — ED Provider Notes (Signed)
History     CSN: [id number]  Arrival date & time 02/24/12  1905   First MD Initiated Contact with Patient 02/24/12 1922      Chief Complaint  Patient presents with  . Dizziness    (Consider location/radiation/quality/duration/timing/severity/associated sxs/prior treatment) HPI Comments: Patient reports malaise, lightheadedness, especially with rapid change in position, fatigue, constipation, and feeling "hung over" for the past several days. Seen by her physician 3 days ago was switched to amlodipine/olmesartan, from lisinopril/hydrochlorothiazide and started azithromycin 500 mg x3 days and narcotic cough syrup as well. States her symptoms started after beginning all these medications. No nausea, vomiting, palpitations, presyncope, syncope. No fevers. States her cough is improving. no chest pain or shortness of breath. States she feels somewhat better today, as she has finished the azithromycin and has not taken any cough syrup since yesterday. States glucose is running within normal limits for her.    Patient is a 45 y.o. female presenting with neurologic complaint. The history is provided by the patient. No language interpreter was used.  Neurologic Problem    Past Medical History  Diagnosis Date  . Hypertension   . Diabetes mellitus   . Obesity   . Hyperlipidemia   . Bronchitis     Past Surgical History  Procedure Date  . Tubal ligation     Family History  Problem Relation Age of Onset  . Hypertension Mother   . Diabetes Mother   . Cancer Neg Hx   . COPD Neg Hx   . Early death Neg Hx   . Heart disease Neg Hx   . Hyperlipidemia Neg Hx   . Kidney disease Neg Hx   . Stroke Neg Hx     History  Substance Use Topics  . Smoking status: Current Every Day Smoker -- 0.5 packs/day for 20 years    Types: Cigarettes  . Smokeless tobacco: Never Used  . Alcohol Use: No    OB History    Grav Para Term Preterm Abortions TAB SAB Ect Mult Living                   Review of Systems  Allergies  Penicillins  Home Medications   Current Outpatient Rx  Name Route Sig Dispense Refill  . AMLODIPINE-OLMESARTAN 10-40 MG PO TABS Oral Take 1 tablet by mouth daily. 56 tablet 0  . PRAVASTATIN SODIUM 10 MG PO TABS Oral Take 10 mg by mouth daily.    Marland Kitchen METFORMIN HCL 500 MG PO TABS Oral Take 500 mg by mouth 2 (two) times daily with a meal.      BP 157/88  Pulse 95  Temp 98.7 F (37.1 C) (Oral)  Resp 20  SpO2 99%  LMP 02/16/2012  BP Readings from Last 3 Encounters:  02/24/12 157/88  02/21/12 140/100  12/29/11 158/100    Physical Exam  Nursing note and vitals reviewed. Constitutional: She is oriented to person, place, and time. She appears well-developed and well-nourished.  HENT:  Head: Normocephalic and atraumatic.  Eyes: Conjunctivae normal and EOM are normal. Pupils are equal, round, and reactive to light.  Neck: Normal range of motion.  Cardiovascular: Normal rate, regular rhythm, normal heart sounds and intact distal pulses.   No murmur heard. Pulmonary/Chest: Effort normal and breath sounds normal.  Abdominal: Soft. Bowel sounds are normal. She exhibits no distension. There is no tenderness. There is no rebound and no guarding.  Musculoskeletal: Normal range of motion. She exhibits no edema and no tenderness.  Neurological:  She is alert and oriented to person, place, and time.  Skin: Skin is warm and dry.  Psychiatric: She has a normal mood and affect. Her behavior is normal. Judgment and thought content normal.    ED Course  Procedures (including critical care time)  Labs Reviewed - No data to display No results found.   1. Medication adverse effect     MDM  Previous records reviewed. As noted in hpi. Labs, imaging dated 9/16 reviewed by myself-  CMP, H&H normal A1c 6.8. Chest x-ray 9/16 showed bronchitis. No pneumonia  Given recent normal labs, x-ray, multiple medication changes, feel that her symptoms are most likely from  this. Suspect poor by mouth intake secondary to not feeling well. Patient appears well, exam is normal. Will have her continue the new blood pressure medication, and see how she does after being off of the cough syrup and antibiotics for several days. Advised increased by mouth intake discussed signs and symptoms that should prompt return to the department. Patient agrees with plan.  Luiz Blare, MD 02/26/12 [phone]

## 2012-03-08 ENCOUNTER — Encounter: Payer: Self-pay | Admitting: Internal Medicine

## 2012-03-08 ENCOUNTER — Telehealth: Payer: Self-pay | Admitting: Internal Medicine

## 2012-03-08 ENCOUNTER — Ambulatory Visit (INDEPENDENT_AMBULATORY_CARE_PROVIDER_SITE_OTHER): Payer: 59 | Admitting: Internal Medicine

## 2012-03-08 VITALS — BP 118/82 | HR 98 | Temp 97.0°F | Resp 16 | Wt 314.9 lb

## 2012-03-08 DIAGNOSIS — I10 Essential (primary) hypertension: Secondary | ICD-10-CM

## 2012-03-08 DIAGNOSIS — Z23 Encounter for immunization: Secondary | ICD-10-CM

## 2012-03-08 DIAGNOSIS — IMO0001 Reserved for inherently not codable concepts without codable children: Secondary | ICD-10-CM

## 2012-03-08 MED ORDER — OLMESARTAN MEDOXOMIL 40 MG PO TABS: 40.0000 mg | ORAL_TABLET | Freq: Every day | ORAL | Status: DC

## 2012-03-08 NOTE — Telephone Encounter (Signed)
Pt reports she is having hot flashes and light headed.  Pt states her vital signs are 131/84(BP) 102(P) and her blood sugar 215. Pt reports she feels like her head is on fire.  Triaged patient per Diabetes:  Control Problems Protocol.  See Provider within 4 hours Disposition for 'New or increasing symptoms or glucose out of control as defined by provider or action plan and taking medications/following therapy as prescribed'.  Home care advice given and appt made for 03/08/12 at 3:45 with Dr Yetta Barre.

## 2012-03-08 NOTE — Assessment & Plan Note (Signed)
Her blood sugars have been well controlled 

## 2012-03-08 NOTE — Progress Notes (Signed)
  Subjective:    Patient ID: Lorraine Holt, female    DOB: Apr 29, 1966, 46 y.o.   MRN: [id number]  Hypertension This is a chronic problem. The current episode started more than 1 year ago. The problem has been gradually improving since onset. The problem is controlled. Pertinent negatives include no anxiety, blurred vision, chest pain, headaches, malaise/fatigue, neck pain, orthopnea, palpitations, peripheral edema, PND, shortness of breath or sweats. There are no associated agents to hypertension. Past treatments include angiotensin blockers and calcium channel blockers. Compliance problems include medication side effects, exercise and diet (constipation and dizziness).       Review of Systems  Constitutional: Negative for fever, chills, malaise/fatigue, diaphoresis, activity change, appetite change, fatigue and unexpected weight change.  HENT: Negative.  Negative for neck pain.   Eyes: Negative.  Negative for blurred vision.  Respiratory: Negative for cough, chest tightness, shortness of breath, wheezing and stridor.   Cardiovascular: Negative for chest pain, palpitations, orthopnea, leg swelling and PND.  Gastrointestinal: Positive for constipation. Negative for nausea, vomiting, abdominal pain, diarrhea and anal bleeding.  Genitourinary: Negative.   Musculoskeletal: Negative for myalgias, back pain, joint swelling, arthralgias and gait problem.  Skin: Negative for color change, pallor, rash and wound.  Neurological: Positive for dizziness. Negative for tremors, seizures, syncope, facial asymmetry, speech difficulty, weakness, light-headedness, numbness and headaches.  Hematological: Negative for adenopathy. Does not bruise/bleed easily.  Psychiatric/Behavioral: Negative.        Objective:   Physical Exam  Vitals reviewed. Constitutional: She is oriented to person, place, and time. She appears well-developed and well-nourished. No distress.  HENT:  Head: Normocephalic and atraumatic.    Mouth/Throat: Oropharynx is clear and moist. No oropharyngeal exudate.  Eyes: Conjunctivae normal are normal. Right eye exhibits no discharge. Left eye exhibits no discharge. No scleral icterus.  Neck: Normal range of motion. Neck supple. No JVD present. No tracheal deviation present. No thyromegaly present.  Cardiovascular: Normal rate, regular rhythm, normal heart sounds and intact distal pulses.  Exam reveals no gallop and no friction rub.   No murmur heard. Pulmonary/Chest: Effort normal and breath sounds normal. No stridor. No respiratory distress. She has no wheezes. She has no rales. She exhibits no tenderness.  Abdominal: Soft. Bowel sounds are normal. She exhibits no distension and no mass. There is no tenderness. There is no rebound and no guarding.  Musculoskeletal: Normal range of motion. She exhibits no edema and no tenderness.  Lymphadenopathy:    She has no cervical adenopathy.  Neurological: She is oriented to person, place, and time.  Skin: Skin is warm and dry. No rash noted. She is not diaphoretic. No erythema. No pallor.  Psychiatric: She has a normal mood and affect. Her behavior is normal. Judgment and thought content normal.     Lab Results  Component Value Date   WBC 4.6 02/21/2012   HGB 14.4 02/21/2012   HCT 44.7 02/21/2012   PLT 236.0 02/21/2012   GLUCOSE 143* 02/21/2012   CHOL 161 02/21/2012   TRIG 102.0 02/21/2012   HDL 39.00* 02/21/2012   LDLCALC 102* 02/21/2012   ALT 16 02/21/2012   AST 20 02/21/2012   NA 135 02/21/2012   K 4.8 02/21/2012   CL 101 02/21/2012   CREATININE 0.9 02/21/2012   BUN 9 02/21/2012   CO2 25 02/21/2012   TSH 2.26 02/21/2012   HGBA1C 6.8* 02/21/2012   MICROALBUR 3.0* 02/21/2012       Assessment & Plan:

## 2012-03-08 NOTE — Assessment & Plan Note (Signed)
Her BP is slightly low and she has some side effects that can be attributed to the CCB so I have asked her to stop azor and will start benicar

## 2012-03-08 NOTE — Patient Instructions (Signed)

## 2012-04-18 ENCOUNTER — Encounter: Payer: Self-pay | Admitting: Internal Medicine

## 2012-04-18 DIAGNOSIS — E1140 Type 2 diabetes mellitus with diabetic neuropathy, unspecified: Secondary | ICD-10-CM

## 2012-04-24 ENCOUNTER — Ambulatory Visit: Payer: 59 | Admitting: *Deleted

## 2012-04-24 ENCOUNTER — Encounter: Payer: Self-pay | Admitting: Internal Medicine

## 2012-04-24 NOTE — Telephone Encounter (Signed)
I sent in a referral today

## 2012-04-26 ENCOUNTER — Encounter: Payer: Self-pay | Admitting: Internal Medicine

## 2012-04-26 ENCOUNTER — Ambulatory Visit (INDEPENDENT_AMBULATORY_CARE_PROVIDER_SITE_OTHER)
Admission: RE | Admit: 2012-04-26 | Discharge: 2012-04-26 | Disposition: A | Discharge status: Home / Self Care | Payer: 59 | Source: Ambulatory Visit | Attending: Internal Medicine | Admitting: Internal Medicine

## 2012-04-26 ENCOUNTER — Ambulatory Visit (INDEPENDENT_AMBULATORY_CARE_PROVIDER_SITE_OTHER): Payer: 59 | Admitting: Internal Medicine

## 2012-04-26 VITALS — BP 118/72 | HR 80 | Temp 97.1°F | Resp 16 | Wt 308.0 lb

## 2012-04-26 DIAGNOSIS — M546 Pain in thoracic spine: Secondary | ICD-10-CM | POA: Insufficient documentation

## 2012-04-26 DIAGNOSIS — I10 Essential (primary) hypertension: Secondary | ICD-10-CM

## 2012-04-26 DIAGNOSIS — IMO0001 Reserved for inherently not codable concepts without codable children: Secondary | ICD-10-CM

## 2012-04-26 MED ORDER — LISINOPRIL 10 MG PO TABS: 10.0000 mg | ORAL_TABLET | Freq: Every day | ORAL | Status: DC

## 2012-04-26 NOTE — Patient Instructions (Signed)
Back Pain, Adult Low back pain is very common. About 1 in 5 people have back pain.The cause of low back pain is rarely dangerous. The pain often gets better over time.About half of people with a sudden onset of back pain feel better in just 2 weeks. About 8 in 10 people feel better by 6 weeks.  CAUSES Some common causes of back pain include:  Strain of the muscles or ligaments supporting the spine.  Wear and tear (degeneration) of the spinal discs.  Arthritis.  Direct injury to the back. DIAGNOSIS Most of the time, the direct cause of low back pain is not known.However, back pain can be treated effectively even when the exact cause of the pain is unknown.Answering your caregiver's questions about your overall health and symptoms is one of the most accurate ways to make sure the cause of your pain is not dangerous. If your caregiver needs more information, he or she may order lab work or imaging tests (X-rays or MRIs).However, even if imaging tests show changes in your back, this usually does not require surgery. HOME CARE INSTRUCTIONS For many people, back pain returns.Since low back pain is rarely dangerous, it is often a condition that people can learn to manageon their own.   Remain active. It is stressful on the back to sit or stand in one place. Do not sit, drive, or stand in one place for more than 30 minutes at a time. Take short walks on level surfaces as soon as pain allows.Try to increase the length of time you walk each day.  Do not stay in bed.Resting more than 1 or 2 days can delay your recovery.  Do not avoid exercise or work.Your body is made to move.It is not dangerous to be active, even though your back may hurt.Your back will likely heal faster if you return to being active before your pain is gone.  Pay attention to your body when you bend and lift. Many people have less discomfortwhen lifting if they bend their knees, keep the load close to their bodies,and  avoid twisting. Often, the most comfortable positions are those that put less stress on your recovering back.  Find a comfortable position to sleep. Use a firm mattress and lie on your side with your knees slightly bent. If you lie on your back, put a pillow under your knees.  Only take over-the-counter or prescription medicines as directed by your caregiver. Over-the-counter medicines to reduce pain and inflammation are often the most helpful.Your caregiver may prescribe muscle relaxant drugs.These medicines help dull your pain so you can more quickly return to your normal activities and healthy exercise.  Put ice on the injured area.  Put ice in a plastic bag.  Place a towel between your skin and the bag.  Leave the ice on for 15 to 20 minutes, 3 to 4 times a day for the first 2 to 3 days. After that, ice and heat may be alternated to reduce pain and spasms.  Ask your caregiver about trying back exercises and gentle massage. This may be of some benefit.  Avoid feeling anxious or stressed.Stress increases muscle tension and can worsen back pain.It is important to recognize when you are anxious or stressed and learn ways to manage it.Exercise is a great option. SEEK MEDICAL CARE IF:  You have pain that is not relieved with rest or medicine.  You have pain that does not improve in 1 week.  You have new symptoms.  You are generally   not feeling well. SEEK IMMEDIATE MEDICAL CARE IF:   You have pain that radiates from your back into your legs.  You develop new bowel or bladder control problems.  You have unusual weakness or numbness in your arms or legs.  You develop nausea or vomiting.  You develop abdominal pain.  You feel faint. Document Released: 05/24/2005 Document Revised: 11/23/2011 Document Reviewed: 10/12/2010 [employer] Patient Information 2013 [employer].  

## 2012-04-26 NOTE — Assessment & Plan Note (Signed)
Her BP is well controlled 

## 2012-04-26 NOTE — Progress Notes (Signed)
Subjective:    Patient ID: Lorraine Holt, female    DOB: 1966/04/01, 46 y.o.   MRN: [id number]  Back Pain This is a new problem. Episode onset: 10 days ago. The problem occurs intermittently. The problem has been gradually improving since onset. The pain is present in the thoracic spine. The quality of the pain is described as stabbing. The pain does not radiate. The pain is at a severity of 2/10. The pain is mild. The pain is worse during the day. The symptoms are aggravated by bending and position. Stiffness is present all day. Pertinent negatives include no abdominal pain, bladder incontinence, bowel incontinence, chest pain, dysuria, fever, headaches, leg pain, numbness, paresis, paresthesias, pelvic pain, perianal numbness, tingling, weakness or weight loss. Risk factors include recent trauma. She has tried analgesics for the symptoms. The treatment provided significant relief.      Review of Systems  Constitutional: Negative.  Negative for fever and weight loss.  HENT: Negative.   Eyes: Negative.   Respiratory: Negative.   Cardiovascular: Negative for chest pain, palpitations and leg swelling.  Gastrointestinal: Negative.  Negative for nausea, vomiting, abdominal pain, diarrhea and bowel incontinence.  Genitourinary: Negative.  Negative for bladder incontinence, dysuria, hematuria and pelvic pain.  Musculoskeletal: Positive for back pain. Negative for myalgias, joint swelling, arthralgias and gait problem.  Skin: Negative.  Negative for color change, pallor, rash and wound.  Neurological: Negative.  Negative for tingling, weakness, numbness, headaches and paresthesias.  Hematological: Negative for adenopathy. Does not bruise/bleed easily.  Psychiatric/Behavioral: Negative.        Objective:   Physical Exam  Vitals reviewed. Constitutional: She is oriented to person, place, and time. She appears well-developed and well-nourished.  Non-toxic appearance. She does not have a sickly  appearance. She does not appear ill. No distress.  HENT:  Head: Normocephalic and atraumatic.  Mouth/Throat: Oropharynx is clear and moist. No oropharyngeal exudate.  Eyes: Conjunctivae normal are normal. Right eye exhibits no discharge. Left eye exhibits no discharge. No scleral icterus.  Neck: Normal range of motion. Neck supple. No JVD present. No tracheal deviation present. No thyromegaly present.  Cardiovascular: Normal rate, regular rhythm, normal heart sounds and intact distal pulses.  Exam reveals no gallop and no friction rub.   No murmur heard. Pulmonary/Chest: Effort normal and breath sounds normal. No stridor. No respiratory distress. She has no wheezes. She has no rales. She exhibits no tenderness.  Abdominal: Soft. Bowel sounds are normal. She exhibits no distension and no mass. There is no tenderness. There is no rebound and no guarding.  Musculoskeletal: Normal range of motion. She exhibits no edema and no tenderness.       Thoracic back: She exhibits tenderness and bony tenderness. She exhibits normal range of motion, no swelling, no edema, no deformity, no laceration, no pain, no spasm and normal pulse.       Back:  Lymphadenopathy:    She has no cervical adenopathy.  Neurological: She is alert and oriented to person, place, and time. She has normal reflexes. She displays normal reflexes. No cranial nerve deficit. She exhibits normal muscle tone. Coordination normal.  Skin: Skin is warm and dry. No rash noted. She is not diaphoretic. No erythema. No pallor.  Psychiatric: She has a normal mood and affect. Her behavior is normal. Judgment and thought content normal.      Lab Results  Component Value Date   WBC 4.6 02/21/2012   HGB 14.4 02/21/2012   HCT 44.7 02/21/2012  PLT 236.0 02/21/2012   GLUCOSE 143* 02/21/2012   CHOL 161 02/21/2012   TRIG 102.0 02/21/2012   HDL 39.00* 02/21/2012   LDLCALC 102* 02/21/2012   ALT 16 02/21/2012   AST 20 02/21/2012   NA 135 02/21/2012   K  4.8 02/21/2012   CL 101 02/21/2012   CREATININE 0.9 02/21/2012   BUN 9 02/21/2012   CO2 25 02/21/2012   TSH 2.26 02/21/2012   HGBA1C 6.8* 02/21/2012   MICROALBUR 3.0* 02/21/2012      Assessment & Plan:

## 2012-04-26 NOTE — Assessment & Plan Note (Signed)
She has had an injury and has some ttp so I have asked her to get some plain films done to look for fracture. She does not want anything stronger than tylenol for pain relief.

## 2012-07-10 ENCOUNTER — Encounter: Payer: Self-pay | Admitting: Internal Medicine

## 2012-07-12 ENCOUNTER — Encounter: Payer: Self-pay | Admitting: Internal Medicine

## 2012-07-12 ENCOUNTER — Other Ambulatory Visit (INDEPENDENT_AMBULATORY_CARE_PROVIDER_SITE_OTHER): Payer: 59

## 2012-07-12 ENCOUNTER — Ambulatory Visit (INDEPENDENT_AMBULATORY_CARE_PROVIDER_SITE_OTHER): Payer: 59 | Admitting: Internal Medicine

## 2012-07-12 VITALS — BP 140/90 | HR 88 | Temp 97.0°F | Resp 16 | Ht 67.0 in | Wt 309.0 lb

## 2012-07-12 DIAGNOSIS — R609 Edema, unspecified: Secondary | ICD-10-CM

## 2012-07-12 DIAGNOSIS — R079 Chest pain, unspecified: Secondary | ICD-10-CM

## 2012-07-12 DIAGNOSIS — I10 Essential (primary) hypertension: Secondary | ICD-10-CM

## 2012-07-12 DIAGNOSIS — R9431 Abnormal electrocardiogram [ECG] [EKG]: Secondary | ICD-10-CM | POA: Insufficient documentation

## 2012-07-12 DIAGNOSIS — IMO0001 Reserved for inherently not codable concepts without codable children: Secondary | ICD-10-CM

## 2012-07-12 LAB — URINALYSIS, ROUTINE W REFLEX MICROSCOPIC
Bilirubin Urine: NEGATIVE
Ketones, ur: NEGATIVE
Leukocytes, UA: NEGATIVE
Nitrite: NEGATIVE
Specific Gravity, Urine: >=1.03 (ref 1.000–1.030)
Total Protein, Urine: NEGATIVE
Urine Glucose: NEGATIVE
Urobilinogen, UA: 0.2 (ref 0.0–1.0)
pH: 6 (ref 5.0–8.0)

## 2012-07-12 LAB — COMPREHENSIVE METABOLIC PANEL
AST: 15 U/L (ref 0–37)
Alkaline Phosphatase: 112 U/L (ref 39–117)
BUN: 13 mg/dL (ref 6–23)
Calcium: 8.9 mg/dL (ref 8.4–10.5)
Chloride: 106 mEq/L (ref 96–112)
Creatinine, Ser: 0.9 mg/dL (ref 0.4–1.2)
Total Bilirubin: 0.6 mg/dL (ref 0.3–1.2)

## 2012-07-12 LAB — CBC WITH DIFFERENTIAL/PLATELET
Basophils Absolute: 0 10*3/uL (ref 0.0–0.1)
Eosinophils Relative: 5.6 % — ABNORMAL HIGH (ref 0.0–5.0)
HCT: 39.3 % (ref 36.0–46.0)
Hemoglobin: 12.9 g/dL (ref 12.0–15.0)
Lymphocytes Relative: 36 % (ref 12.0–46.0)
Monocytes Relative: 7.7 % (ref 3.0–12.0)
Neutro Abs: 3.1 10*3/uL (ref 1.4–7.7)
RBC: 4.61 Mil/uL (ref 3.87–5.11)
RDW: 14.4 % (ref 11.5–14.6)
WBC: 6.3 10*3/uL (ref 4.5–10.5)

## 2012-07-12 LAB — CARDIAC PANEL
CK-MB: 2.8 ng/mL (ref 0.3–4.0)
Relative Index: 1.6 calc (ref 0.0–2.5)

## 2012-07-12 LAB — TSH: TSH: 3.26 u[IU]/mL (ref 0.35–5.50)

## 2012-07-12 LAB — HEMOGLOBIN A1C: Hgb A1c MFr Bld: 6.9 % — ABNORMAL HIGH (ref 4.6–6.5)

## 2012-07-12 LAB — D-DIMER, QUANTITATIVE: D-Dimer, Quant: 0.55 ug/mL-FEU — ABNORMAL HIGH (ref 0.00–0.48)

## 2012-07-12 MED ORDER — AZILSARTAN-CHLORTHALIDONE 40-12.5 MG PO TABS: 1.0000 | ORAL_TABLET | Freq: Every day | ORAL | Status: DC

## 2012-07-12 NOTE — Patient Instructions (Signed)
Chest Pain (Nonspecific) It is often hard to give a specific diagnosis for the cause of chest pain. There is always a chance that your pain could be related to something serious, such as a heart attack or a blood clot in the lungs. You need to follow up with your caregiver for further evaluation. CAUSES   Heartburn.  Pneumonia or bronchitis.  Anxiety or stress.  Inflammation around your heart (pericarditis) or lung (pleuritis or pleurisy).  A blood clot in the lung.  A collapsed lung (pneumothorax). It can develop suddenly on its own (spontaneous pneumothorax) or from injury (trauma) to the chest.  Shingles infection (herpes zoster virus). The chest wall is composed of bones, muscles, and cartilage. Any of these can be the source of the pain.  The bones can be bruised by injury.  The muscles or cartilage can be strained by coughing or overwork.  The cartilage can be affected by inflammation and become sore (costochondritis). DIAGNOSIS  Lab tests or other studies, such as X-rays, electrocardiography, stress testing, or cardiac imaging, may be needed to find the cause of your pain.  TREATMENT   Treatment depends on what may be causing your chest pain. Treatment may include:  Acid blockers for heartburn.  Anti-inflammatory medicine.  Pain medicine for inflammatory conditions.  Antibiotics if an infection is present.  You may be advised to change lifestyle habits. This includes stopping smoking and avoiding alcohol, caffeine, and chocolate.  You may be advised to keep your head raised (elevated) when sleeping. This reduces the chance of acid going backward from your stomach into your esophagus.  Most of the time, nonspecific chest pain will improve within 2 to 3 days with rest and mild pain medicine. HOME CARE INSTRUCTIONS   If antibiotics were prescribed, take your antibiotics as directed. Finish them even if you start to feel better.  For the next few days, avoid physical  activities that bring on chest pain. Continue physical activities as directed.  Do not smoke.  Avoid drinking alcohol.  Only take over-the-counter or prescription medicine for pain, discomfort, or fever as directed by your caregiver.  Follow your caregiver's suggestions for further testing if your chest pain does not go away.  Keep any follow-up appointments you made. If you do not go to an appointment, you could develop lasting (chronic) problems with pain. If there is any problem keeping an appointment, you must call to reschedule. SEEK MEDICAL CARE IF:   You think you are having problems from the medicine you are taking. Read your medicine instructions carefully.  Your chest pain does not go away, even after treatment.  You develop a rash with blisters on your chest. SEEK IMMEDIATE MEDICAL CARE IF:   You have increased chest pain or pain that spreads to your arm, neck, jaw, back, or abdomen.  You develop shortness of breath, an increasing cough, or you are coughing up blood.  You have severe back or abdominal pain, feel nauseous, or vomit.  You develop severe weakness, fainting, or chills.  You have a fever. THIS IS AN EMERGENCY. Do not wait to see if the pain will go away. Get medical help at once. Call your local emergency services (911 in [place] Do not drive yourself to the hospital. MAKE SURE YOU:   Understand these instructions.  Will watch your condition.  Will get help right away if you are not doing well or get worse. Document Released: 03/03/2005 Document Revised: 08/16/2011 Document Reviewed: 12/28/2007 Exit[employer] Patient Information 2013 [employer].  

## 2012-07-12 NOTE — Assessment & Plan Note (Signed)
Her BP is not well controlled and she had edema so I have changed her meds to edarbyclor Today I will check her lytes and renal function

## 2012-07-12 NOTE — Assessment & Plan Note (Signed)
Her EKG shows minimal LAE, I am concerned she may have diastolic dysfunction so I have asked her to see cardiology

## 2012-07-12 NOTE — Assessment & Plan Note (Signed)
I will check her labs to look for thyroid disease, proteinuria, chf, renal failure, dvt, ischemia I have also asked her to see cardiology She will start chlorthalidone

## 2012-07-12 NOTE — Assessment & Plan Note (Signed)
Her EKG is neg for ischemia I will check her labs for ischemia

## 2012-07-12 NOTE — Progress Notes (Signed)
Subjective:    Patient ID: Lorraine Holt, female    DOB: 02-Apr-1966, 47 y.o.   MRN: [id number]  Chest Pain  This is a new problem. The current episode started 1 to 4 weeks ago (3 weeks ago). The onset quality is gradual. The problem occurs intermittently. The problem has been unchanged. The pain is present in the lateral region (left side). The pain is at a severity of 2/10. The pain is mild. The quality of the pain is described as sharp. The pain does not radiate. Associated symptoms include lower extremity edema and malaise/fatigue. Pertinent negatives include no abdominal pain, back pain, claudication, cough, diaphoresis, dizziness, exertional chest pressure, fever, headaches, hemoptysis, irregular heartbeat, leg pain, nausea, near-syncope, numbness, orthopnea, palpitations, PND, shortness of breath, sputum production, syncope, vomiting or weakness. The pain is aggravated by nothing. She has tried nothing for the symptoms. Risk factors include sedentary lifestyle, smoking/tobacco exposure, obesity and lack of exercise.      Review of Systems  Constitutional: Positive for malaise/fatigue. Negative for fever, chills, diaphoresis, activity change, appetite change and fatigue.  HENT: Negative.   Eyes: Negative.   Respiratory: Negative for apnea, cough, hemoptysis, sputum production, choking, chest tightness, shortness of breath, wheezing and stridor.   Cardiovascular: Positive for chest pain and leg swelling. Negative for palpitations, orthopnea, claudication, syncope, PND and near-syncope.  Gastrointestinal: Negative for nausea, vomiting, abdominal pain, diarrhea, constipation, blood in stool and abdominal distention.  Genitourinary: Negative.   Musculoskeletal: Negative for myalgias, back pain, joint swelling, arthralgias and gait problem.  Skin: Negative for color change, pallor, rash and wound.  Neurological: Negative for dizziness, tremors, syncope, weakness, light-headedness, numbness and  headaches.  Hematological: Negative for adenopathy. Does not bruise/bleed easily.  Psychiatric/Behavioral: Negative.        Objective:   Physical Exam  Vitals reviewed. Constitutional: She is oriented to person, place, and time. She appears well-developed and well-nourished. No distress.  HENT:  Head: Normocephalic and atraumatic.  Mouth/Throat: Oropharynx is clear and moist. No oropharyngeal exudate.  Eyes: Conjunctivae normal are normal. Right eye exhibits no discharge. Left eye exhibits no discharge. No scleral icterus.  Neck: Normal range of motion. Neck supple. No JVD present. No tracheal deviation present. No thyromegaly present.  Cardiovascular: Normal rate, regular rhythm, normal heart sounds and intact distal pulses.  Exam reveals no gallop and no friction rub.   No murmur heard. Pulmonary/Chest: Effort normal and breath sounds normal. No stridor. No respiratory distress. She has no wheezes. She has no rales. She exhibits no tenderness.  Abdominal: Soft. Bowel sounds are normal. She exhibits no distension and no mass. There is no tenderness. There is no rebound and no guarding.  Musculoskeletal: Normal range of motion. She exhibits edema (1+ pitting edema in BLE). She exhibits no tenderness.  Lymphadenopathy:    She has no cervical adenopathy.  Neurological: She is alert and oriented to person, place, and time. She has normal reflexes.  Skin: Skin is warm and dry. No rash noted. She is not diaphoretic. No erythema. No pallor.  Psychiatric: She has a normal mood and affect. Her behavior is normal. Judgment and thought content normal.      Lab Results  Component Value Date   WBC 4.6 02/21/2012   HGB 14.4 02/21/2012   HCT 44.7 02/21/2012   PLT 236.0 02/21/2012   GLUCOSE 143* 02/21/2012   CHOL 161 02/21/2012   TRIG 102.0 02/21/2012   HDL 39.00* 02/21/2012   LDLCALC 102* 02/21/2012   ALT 16  02/21/2012   AST 20 02/21/2012   NA 135 02/21/2012   K 4.8 02/21/2012   CL 101 02/21/2012    CREATININE 0.9 02/21/2012   BUN 9 02/21/2012   CO2 25 02/21/2012   TSH 2.26 02/21/2012   HGBA1C 6.8* 02/21/2012   MICROALBUR 3.0* 02/21/2012      Assessment & Plan:

## 2012-07-12 NOTE — Assessment & Plan Note (Signed)
I will recheck her a1c today and will treat if needed

## 2012-07-26 ENCOUNTER — Ambulatory Visit (INDEPENDENT_AMBULATORY_CARE_PROVIDER_SITE_OTHER): Payer: 59 | Admitting: Cardiovascular Disease

## 2012-07-26 ENCOUNTER — Encounter: Payer: Self-pay | Admitting: Cardiovascular Disease

## 2012-07-26 VITALS — BP 130/92 | HR 98 | Ht 67.5 in | Wt 311.0 lb

## 2012-07-26 DIAGNOSIS — I10 Essential (primary) hypertension: Secondary | ICD-10-CM

## 2012-07-26 DIAGNOSIS — R079 Chest pain, unspecified: Secondary | ICD-10-CM

## 2012-07-26 NOTE — Progress Notes (Signed)
Lorraine Holt Date of Birth  [birth year]       [facility]    [place] [address]  [address] [place], [place]  [address]   [place], [place]  [address] [phone]     [phone]   Fax  [phone]    Fax [phone]  Problem List: 1. Hypertension 2. Morbid obesity 3. Leg edema 4. Chest pain 5. Diabetes Mellitus type 2   History of Present Illness:  Lorraine Holt is a 40 wo with recent onset of CP.  She has had some rotator cuff problems with her left shoulder.  The CP is a sharp, "prickly pain", lasts from a few seconds - a few minutes.   She had a stress Myoview in 2012  which was reported normal. I am not able to find the results.    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   Current Outpatient Prescriptions on File Prior to Visit  Medication Sig Dispense Refill  . Azilsartan-Chlorthalidone (EDARBYCLOR) 40-12.5 MG TABS Take 1 tablet by mouth daily.  35 tablet  0  . metFORMIN (GLUCOPHAGE) 500 MG tablet Take 500 mg by mouth 2 (two) times daily with a meal.      . pravastatin (PRAVACHOL) 10 MG tablet Take 10 mg by mouth daily.       No current facility-administered medications on file prior to visit.    Allergies  Allergen Reactions  . Penicillins Swelling and Rash  . Amlodipine     Constipation, dizziness    Past Medical History  Diagnosis Date  . Hypertension   . Diabetes mellitus   . Obesity   . Hyperlipidemia   . Bronchitis     Past Surgical History  Procedure Laterality Date  . Tubal ligation    . Dilation and curettage of uterus      History  Smoking status  . Current Every Day Smoker -- 0.50 packs/day for 20 years  . Types: Cigarettes  Smokeless tobacco  . Never Used    History  Alcohol Use No    Family History  Problem Relation Age of Onset  . Hypertension Mother   . Diabetes Mother     . Cancer Neg Hx   . COPD Neg Hx   . Early death Neg Hx   . Heart disease Neg Hx   . Hyperlipidemia Neg Hx   . Kidney disease Neg Hx   . Stroke Neg Hx     Reviw of Systems:  Reviewed in the HPI.  All other systems are negative.  Physical Exam: Blood pressure 130/92, pulse 98, height 5' 7.5" (1.715 m), weight 311 lb (141.069 kg), last menstrual period 07/10/2012, SpO2 99.00%. General: Well developed, well nourished, in no acute distress.  Head: Normocephalic, atraumatic, sclera non-icteric, mucus membranes are moist,   Neck: Supple. Carotids are 2 + without bruits. No JVD   Lungs: Clear   Heart: RR,   Abdomen: Soft, non-tender, non-distended with normal bowel sounds.  She is moderately obese  Msk:  Strength and tone are normal   Extremities: No clubbing or cyanosis. No edema.  Distal pedal pulses are 2+ and equal    Neuro: CN II - XII intact.  Alert and oriented X 3.   Psych:  Normal   ECG: Feb. 5, 2014:  NSR, no ST or T wave changes, normal  ECG  Assessment / Plan:

## 2012-07-26 NOTE — Patient Instructions (Addendum)
Follow up if needed

## 2012-07-26 NOTE — Assessment & Plan Note (Signed)
Lorraine Holt results today for further evaluation of some chest pain. These pains have been there for several years. In fact, she had a stress Myoview study in August, 2012 which is normal. He has a completely normal EKG and quite sure that the insurance will not pay for another stress Myoview study given her atypical symptoms and her normal EKG.  I did offer to perform a regular stress test but she declined.  I've recommended that she stop smoking. I've recommended that she start a regular exercise program and perhaps join Weight Watchers. I've also suggested that she look into some of the weight loss programs offered by [facility] in [facility]. We have some terrific programs to assist with weight loss.  I'll see her back on an as-needed basis.

## 2012-07-26 NOTE — Assessment & Plan Note (Signed)
Her blood pressure is under fair control. She's on one of the newer ARB/HCTZ agents. If this turns out to be too expensive for her, she may be a be able to take losartan/HCTZ which will be more affordable.  We will have Dr. Yetta Barre continue to treat her hypertension.

## 2012-09-06 ENCOUNTER — Encounter: Payer: Self-pay | Admitting: Internal Medicine

## 2012-09-06 DIAGNOSIS — IMO0001 Reserved for inherently not codable concepts without codable children: Secondary | ICD-10-CM

## 2012-09-06 DIAGNOSIS — I10 Essential (primary) hypertension: Secondary | ICD-10-CM

## 2012-09-06 DIAGNOSIS — R609 Edema, unspecified: Secondary | ICD-10-CM

## 2012-09-07 MED ORDER — METFORMIN HCL 500 MG PO TABS: 500.0000 mg | ORAL_TABLET | Freq: Two times a day (BID) | ORAL | Status: DC

## 2012-09-07 MED ORDER — PRAVASTATIN SODIUM 10 MG PO TABS: 10.0000 mg | ORAL_TABLET | Freq: Every day | ORAL | Status: DC

## 2012-09-07 MED ORDER — AZILSARTAN-CHLORTHALIDONE 40-12.5 MG PO TABS: 1.0000 | ORAL_TABLET | Freq: Every day | ORAL | Status: DC

## 2012-09-11 ENCOUNTER — Telehealth: Payer: Self-pay | Admitting: Internal Medicine

## 2012-09-11 ENCOUNTER — Telehealth: Payer: Self-pay

## 2012-09-11 DIAGNOSIS — IMO0001 Reserved for inherently not codable concepts without codable children: Secondary | ICD-10-CM

## 2012-09-11 DIAGNOSIS — I10 Essential (primary) hypertension: Secondary | ICD-10-CM

## 2012-09-11 MED ORDER — LOSARTAN POTASSIUM-HCTZ 100-12.5 MG PO TABS: 1.0000 | ORAL_TABLET | Freq: Every day | ORAL | Status: DC

## 2012-09-11 NOTE — Telephone Encounter (Signed)
Patient stopped by requesting that BP med is separated in order to have a 0$ copay per her pharmacy. Epic med list updated

## 2012-09-11 NOTE — Telephone Encounter (Signed)
Needs an RX for generic Edarbyclor 40/12.5 mg for her BP to be called in to [facility].  She had samples before.

## 2012-09-15 ENCOUNTER — Other Ambulatory Visit: Payer: Self-pay

## 2012-09-15 MED ORDER — LOSARTAN POTASSIUM 100 MG PO TABS: 100.0000 mg | ORAL_TABLET | Freq: Every day | ORAL | Status: DC

## 2012-09-15 MED ORDER — HYDROCHLOROTHIAZIDE 12.5 MG PO CAPS: 12.5000 mg | ORAL_CAPSULE | Freq: Every day | ORAL | Status: DC

## 2012-12-27 ENCOUNTER — Other Ambulatory Visit: Payer: Self-pay | Admitting: Internal Medicine

## 2012-12-27 DIAGNOSIS — Z1231 Encounter for screening mammogram for malignant neoplasm of breast: Secondary | ICD-10-CM

## 2012-12-28 ENCOUNTER — Ambulatory Visit (HOSPITAL_COMMUNITY)
Admission: RE | Admit: 2012-12-28 | Discharge: 2012-12-28 | Disposition: A | Discharge status: Home / Self Care | Payer: 59 | Source: Ambulatory Visit | Attending: Internal Medicine | Admitting: Internal Medicine

## 2012-12-28 DIAGNOSIS — Z1231 Encounter for screening mammogram for malignant neoplasm of breast: Secondary | ICD-10-CM | POA: Insufficient documentation

## 2012-12-29 LAB — HM MAMMOGRAPHY

## 2013-01-01 ENCOUNTER — Other Ambulatory Visit: Payer: Self-pay | Admitting: Internal Medicine

## 2013-01-01 DIAGNOSIS — R928 Other abnormal and inconclusive findings on diagnostic imaging of breast: Secondary | ICD-10-CM

## 2013-01-05 ENCOUNTER — Encounter: Payer: Self-pay | Admitting: Internal Medicine

## 2013-01-16 ENCOUNTER — Other Ambulatory Visit: Payer: 59

## 2013-01-30 ENCOUNTER — Ambulatory Visit
Admission: RE | Admit: 2013-01-30 | Discharge: 2013-01-30 | Disposition: A | Discharge status: Home / Self Care | Payer: 59 | Source: Ambulatory Visit | Attending: Internal Medicine | Admitting: Internal Medicine

## 2013-01-30 DIAGNOSIS — R928 Other abnormal and inconclusive findings on diagnostic imaging of breast: Secondary | ICD-10-CM

## 2013-01-30 LAB — HM MAMMOGRAPHY: HM Mammogram: NORMAL

## 2013-02-18 ENCOUNTER — Encounter: Payer: Self-pay | Admitting: Internal Medicine

## 2013-03-13 ENCOUNTER — Telehealth: Payer: Self-pay | Admitting: *Deleted

## 2013-03-13 DIAGNOSIS — Z01419 Encounter for gynecological examination (general) (routine) without abnormal findings: Secondary | ICD-10-CM

## 2013-03-13 NOTE — Telephone Encounter (Signed)
Ok thx.

## 2013-03-13 NOTE — Telephone Encounter (Signed)
Left msg on triage stating she need a referral to [facility]. Fax to [phone] Att; New pt appt. MD is out of the office this week. Pls advise on msg...lmb

## 2013-03-14 NOTE — Telephone Encounter (Signed)
Dr. Posey Rea referral has to be put in so [facility] can make appt...lmb

## 2013-03-15 NOTE — Telephone Encounter (Signed)
What is it for?

## 2013-03-16 NOTE — Telephone Encounter (Signed)
Ok Done Thxx

## 2013-03-16 NOTE — Telephone Encounter (Signed)
Called pt no answer [facility] referral has been place.../lmb 

## 2013-03-16 NOTE — Telephone Encounter (Signed)
For general establishment pap smear ect...have not seen gynecologist...lmb

## 2013-03-16 NOTE — Addendum Note (Signed)
Addended by: Tresa Garter on: 03/16/2013 01:24 PM   Modules accepted: Orders

## 2013-04-12 ENCOUNTER — Other Ambulatory Visit: Payer: Self-pay

## 2013-04-18 LAB — HM PAP SMEAR

## 2013-04-20 ENCOUNTER — Other Ambulatory Visit (HOSPITAL_COMMUNITY)
Admission: RE | Admit: 2013-04-20 | Discharge: 2013-04-20 | Disposition: A | Discharge status: Home / Self Care | Payer: 59 | Source: Ambulatory Visit | Attending: Family Medicine | Admitting: Family Medicine

## 2013-04-20 ENCOUNTER — Encounter: Payer: Self-pay | Admitting: Family Medicine

## 2013-04-20 ENCOUNTER — Ambulatory Visit (INDEPENDENT_AMBULATORY_CARE_PROVIDER_SITE_OTHER): Payer: 59 | Admitting: Family Medicine

## 2013-04-20 VITALS — BP 159/109 | HR 86 | Temp 97.6°F | Ht 67.0 in | Wt 313.1 lb

## 2013-04-20 DIAGNOSIS — N939 Abnormal uterine and vaginal bleeding, unspecified: Secondary | ICD-10-CM | POA: Insufficient documentation

## 2013-04-20 DIAGNOSIS — N926 Irregular menstruation, unspecified: Secondary | ICD-10-CM

## 2013-04-20 DIAGNOSIS — Z01812 Encounter for preprocedural laboratory examination: Secondary | ICD-10-CM

## 2013-04-20 LAB — POCT PREGNANCY, URINE: Preg Test, Ur: NEGATIVE

## 2013-04-20 NOTE — Progress Notes (Signed)
Subjective:     Patient ID: Lorraine Holt, female   DOB: 12-19-1965, 47 y.o.   MRN: [id number]  HPI 47 y.o. F presents for evaluation of abnormal uterine bleeding. Pt has hx of fibroids that have been "Frozen" previously. Pt states that she is having increased menstraul duration. Pt has heavy menstrauls requiring tampon and a pad. Reports that she goes through 20 super plus absorbant tampons 3-4days. Then her period tapers off and gets lighter. Then has spotting for next 3-7days.   +Blood on stool, occasionally notices red and sore, feels flared and agitated.   FHx colon cancer, lung, kidney. Mostly 2nd degree relatives.  Review of Systems No palpitations, no SOB, no light headedness. occassional migraines.     Objective:   Physical Exam Filed Vitals:   04/20/13 1055  BP: 159/109  Pulse: 86  Temp:   Has not taken her medications  NAD, elevated BP Cervix nomral, with minimal bleeding at this time.     Assessment:     47 y.o. F with metro and menorrhagia here for evaluation. Dysfunctional uterine bleeding. Evaluated today and f/u in 2 weeks for plan.     Plan:     [place] of uterus for stripe evaluation EMB today.      Pt to see PCM for rectal bleeding.  Patient given informed consent, signed copy in the chart, time out was performed. Appropriate time out taken. . The patient was placed in the lithotomy position and the cervix brought into view with sterile speculum.  Portio of cervix cleansed x 2 with betadine swabs.  A tenaculum was placed in the anterior lip of the cervix.  The uterus was sounded for depth of 6. A pipelle was introduced to into the uterus, suction created,  and an endometrial sample was obtained. All equipment was removed and accounted for.  The patient tolerated the procedure well.    Patient given post procedure instructions. The patient will return in 2 weeks for results.

## 2013-04-20 NOTE — Progress Notes (Signed)
Pt has not taken her BP medication due to pt stating she was in a rush

## 2013-04-30 ENCOUNTER — Encounter: Payer: Self-pay | Admitting: *Deleted

## 2013-04-30 ENCOUNTER — Encounter: Payer: Self-pay | Admitting: Family Medicine

## 2013-04-30 DIAGNOSIS — Z124 Encounter for screening for malignant neoplasm of cervix: Secondary | ICD-10-CM | POA: Insufficient documentation

## 2013-05-01 ENCOUNTER — Encounter: Payer: Self-pay | Admitting: Internal Medicine

## 2013-05-25 ENCOUNTER — Ambulatory Visit: Payer: 59 | Admitting: Family Medicine

## 2013-08-06 ENCOUNTER — Other Ambulatory Visit: Payer: Self-pay | Admitting: Internal Medicine

## 2013-08-08 ENCOUNTER — Encounter: Payer: Self-pay | Admitting: Internal Medicine

## 2013-08-08 ENCOUNTER — Other Ambulatory Visit (INDEPENDENT_AMBULATORY_CARE_PROVIDER_SITE_OTHER): Payer: 59

## 2013-08-08 ENCOUNTER — Ambulatory Visit (INDEPENDENT_AMBULATORY_CARE_PROVIDER_SITE_OTHER): Payer: 59 | Admitting: Internal Medicine

## 2013-08-08 ENCOUNTER — Ambulatory Visit (HOSPITAL_COMMUNITY)
Admission: RE | Admit: 2013-08-08 | Discharge: 2013-08-08 | Disposition: A | Discharge status: Home / Self Care | Payer: 59 | Source: Ambulatory Visit | Attending: Internal Medicine | Admitting: Internal Medicine

## 2013-08-08 VITALS — BP 126/80 | HR 80 | Temp 97.8°F | Resp 16 | Ht 67.0 in | Wt 321.0 lb

## 2013-08-08 DIAGNOSIS — E785 Hyperlipidemia, unspecified: Secondary | ICD-10-CM | POA: Insufficient documentation

## 2013-08-08 DIAGNOSIS — M25469 Effusion, unspecified knee: Secondary | ICD-10-CM | POA: Insufficient documentation

## 2013-08-08 DIAGNOSIS — I10 Essential (primary) hypertension: Secondary | ICD-10-CM

## 2013-08-08 DIAGNOSIS — M25569 Pain in unspecified knee: Secondary | ICD-10-CM

## 2013-08-08 DIAGNOSIS — M25561 Pain in right knee: Secondary | ICD-10-CM

## 2013-08-08 DIAGNOSIS — IMO0002 Reserved for concepts with insufficient information to code with codable children: Secondary | ICD-10-CM | POA: Insufficient documentation

## 2013-08-08 DIAGNOSIS — IMO0001 Reserved for inherently not codable concepts without codable children: Secondary | ICD-10-CM

## 2013-08-08 DIAGNOSIS — M1710 Unilateral primary osteoarthritis, unspecified knee: Secondary | ICD-10-CM | POA: Insufficient documentation

## 2013-08-08 DIAGNOSIS — E1165 Type 2 diabetes mellitus with hyperglycemia: Principal | ICD-10-CM

## 2013-08-08 LAB — BASIC METABOLIC PANEL
BUN: 10 mg/dL (ref 6–23)
CHLORIDE: 103 meq/L (ref 96–112)
CO2: 27 mEq/L (ref 19–32)
CREATININE: 0.8 mg/dL (ref 0.4–1.2)
Calcium: 9.3 mg/dL (ref 8.4–10.5)
GFR: 103.57 mL/min (ref >60.00)
Glucose, Bld: 111 mg/dL — ABNORMAL HIGH (ref 70–99)
POTASSIUM: 3.8 meq/L (ref 3.5–5.1)
Sodium: 136 mEq/L (ref 135–145)

## 2013-08-08 LAB — HEMOGLOBIN A1C: HEMOGLOBIN A1C: 7 % — AB (ref 4.6–6.5)

## 2013-08-08 LAB — TSH: TSH: 3.51 u[IU]/mL (ref 0.35–5.50)

## 2013-08-08 LAB — URINALYSIS, ROUTINE W REFLEX MICROSCOPIC
Bilirubin Urine: NEGATIVE
Hgb urine dipstick: NEGATIVE
KETONES UR: NEGATIVE
LEUKOCYTES UA: NEGATIVE
Nitrite: NEGATIVE
PH: 6 (ref 5.0–8.0)
SPECIFIC GRAVITY, URINE: 1.02 (ref 1.000–1.030)
TOTAL PROTEIN, URINE-UPE24: NEGATIVE
URINE GLUCOSE: NEGATIVE
Urobilinogen, UA: 0.2 (ref 0.0–1.0)

## 2013-08-08 LAB — LIPID PANEL
CHOL/HDL RATIO: 5
Cholesterol: 183 mg/dL (ref 0–200)
HDL: 39.7 mg/dL (ref >39.00)
LDL CALC: 122 mg/dL — AB (ref 0–99)
TRIGLYCERIDES: 109 mg/dL (ref 0.0–149.0)
VLDL: 21.8 mg/dL (ref 0.0–40.0)

## 2013-08-08 LAB — MICROALBUMIN / CREATININE URINE RATIO
Creatinine,U: 79.9 mg/dL
Microalb Creat Ratio: 1.4 mg/g (ref 0.0–30.0)
Microalb, Ur: 1.1 mg/dL (ref 0.0–1.9)

## 2013-08-08 MED ORDER — METFORMIN HCL 500 MG PO TABS: 500.0000 mg | ORAL_TABLET | Freq: Two times a day (BID) | ORAL | Status: DC

## 2013-08-08 MED ORDER — LOSARTAN POTASSIUM 100 MG PO TABS: 100.0000 mg | ORAL_TABLET | Freq: Every day | ORAL | Status: DC

## 2013-08-08 MED ORDER — PRAVASTATIN SODIUM 10 MG PO TABS: 10.0000 mg | ORAL_TABLET | Freq: Every day | ORAL | Status: DC

## 2013-08-08 MED ORDER — HYDROCHLOROTHIAZIDE 12.5 MG PO CAPS: 12.5000 mg | ORAL_CAPSULE | Freq: Every day | ORAL | Status: DC

## 2013-08-08 NOTE — Assessment & Plan Note (Signed)
Her BP is well controlled I will monitor her lytes and renal function 

## 2013-08-08 NOTE — Progress Notes (Signed)
Subjective:    Patient ID: Lorraine Holt, female    DOB: [birth year], 48 y.o.   MRN: [id number]  Arthritis Presents for follow-up visit. The disease course has been fluctuating. The condition has lasted for 3 years. She complains of pain and joint swelling. She reports no stiffness or joint warmth. Affected locations include the right knee. Her pain is at a severity of 2/10. Pertinent negatives include no diarrhea, dry eyes, dry mouth, dysuria, fatigue, fever, pain at night, pain while resting, rash, Raynaud's syndrome, uveitis or weight loss. Her past medical history is significant for osteoarthritis. Her pertinent risk factors include overuse. Past treatments include acetaminophen. The treatment provided moderate relief. Factors aggravating her arthritis include activity, climbing stairs and descending stairs. Compliance with prior treatments has been good.      Review of Systems  Constitutional: Negative.  Negative for fever, chills, weight loss, diaphoresis, appetite change and fatigue.  HENT: Negative.   Eyes: Negative.   Respiratory: Negative.  Negative for apnea, cough, choking, chest tightness, shortness of breath, wheezing and stridor.   Cardiovascular: Negative.  Negative for chest pain, palpitations and leg swelling.  Gastrointestinal: Negative.  Negative for nausea, vomiting, diarrhea, constipation, blood in stool and abdominal distention.  Endocrine: Negative.  Negative for polydipsia, polyphagia and polyuria.  Genitourinary: Negative.  Negative for dysuria.  Musculoskeletal: Positive for arthralgias, arthritis and joint swelling. Negative for back pain, gait problem, myalgias, neck pain, neck stiffness and stiffness.  Skin: Negative.  Negative for rash.  Allergic/Immunologic: Negative.   Neurological: Negative.  Negative for dizziness, weakness, light-headedness and headaches.  Hematological: Negative.  Negative for adenopathy. Does not bruise/bleed easily.    Psychiatric/Behavioral: Negative.        Objective:   Physical Exam  Vitals reviewed. Constitutional: She is oriented to person, place, and time. She appears well-developed and well-nourished. No distress.  HENT:  Head: Normocephalic and atraumatic.  Mouth/Throat: Oropharynx is clear and moist. No oropharyngeal exudate.  Eyes: Conjunctivae are normal. Right eye exhibits no discharge. Left eye exhibits no discharge. No scleral icterus.  Neck: Normal range of motion. Neck supple. No JVD present. No tracheal deviation present. No thyromegaly present.  Cardiovascular: Normal rate, regular rhythm, normal heart sounds and intact distal pulses.  Exam reveals no gallop and no friction rub.   No murmur heard. Pulmonary/Chest: Effort normal and breath sounds normal. No stridor. No respiratory distress. She has no wheezes. She has no rales. She exhibits no tenderness.  Abdominal: Soft. Bowel sounds are normal. She exhibits no distension and no mass. There is no tenderness. There is no rebound and no guarding.  Musculoskeletal: Normal range of motion. She exhibits no edema and no tenderness.       Right knee: She exhibits deformity (DJD changes). She exhibits normal range of motion, no swelling, no effusion, no ecchymosis, no laceration, no erythema, normal alignment, no LCL laxity, normal patellar mobility and no bony tenderness. No tenderness found.  Lymphadenopathy:    She has no cervical adenopathy.  Neurological: She is oriented to person, place, and time.  Skin: Skin is warm and dry. No rash noted. She is not diaphoretic. No erythema. No pallor.     Lab Results  Component Value Date   WBC 6.3 07/12/2012   HGB 12.9 07/12/2012   HCT 39.3 07/12/2012   PLT 272.0 07/12/2012   GLUCOSE 96 07/12/2012   CHOL 161 02/21/2012   TRIG 102.0 02/21/2012   HDL 39.00* 02/21/2012   LDLCALC 102* 02/21/2012  ALT 13 07/12/2012   AST 15 07/12/2012   NA 139 07/12/2012   K 3.9 07/12/2012   CL 106 07/12/2012   CREATININE 0.9  07/12/2012   BUN 13 07/12/2012   CO2 27 07/12/2012   TSH 3.26 07/12/2012   HGBA1C 6.9* 07/12/2012   MICROALBUR 3.0* 02/21/2012       Assessment & Plan:

## 2013-08-08 NOTE — Progress Notes (Signed)
Pre visit review using our clinic review tool, if applicable. No additional management support is needed unless otherwise documented below in the visit note. 

## 2013-08-08 NOTE — Assessment & Plan Note (Signed)
I will check her A1C and will advise further Will also monitor her renal function

## 2013-08-08 NOTE — Assessment & Plan Note (Signed)
I will check an xray - if it appears that she has severe DJD then will refer to ortho for further evaluation

## 2013-08-08 NOTE — Assessment & Plan Note (Signed)
She is doing well on pravachol, I will check her FLP today

## 2013-08-08 NOTE — Patient Instructions (Signed)
Type 2 Diabetes Mellitus, Adult Type 2 diabetes mellitus, often simply referred to as type 2 diabetes, is a long-lasting (chronic) disease. In type 2 diabetes, the pancreas does not make enough insulin (a hormone), the cells are less responsive to the insulin that is made (insulin resistance), or both. Normally, insulin moves sugars from food into the tissue cells. The tissue cells use the sugars for energy. The lack of insulin or the lack of normal response to insulin causes excess sugars to build up in the blood instead of going into the tissue cells. As a result, high blood sugar (hyperglycemia) develops. The effect of high sugar (glucose) levels can cause many complications. Type 2 diabetes was also previously called adult-onset diabetes but it can occur at any age.  RISK FACTORS  A person is predisposed to developing type 2 diabetes if someone in the family has the disease and also has one or more of the following primary risk factors:  Overweight.  An inactive lifestyle.  A history of consistently eating high-calorie foods. Maintaining a normal weight and regular physical activity can reduce the chance of developing type 2 diabetes. SYMPTOMS  A person with type 2 diabetes may not show symptoms initially. The symptoms of type 2 diabetes appear slowly. The symptoms include:  Increased thirst (polydipsia).  Increased urination (polyuria).  Increased urination during the night (nocturia).  Weight loss. This weight loss may be rapid.  Frequent, recurring infections.  Tiredness (fatigue).  Weakness.  Vision changes, such as blurred vision.  Fruity smell to your breath.  Abdominal pain.  Nausea or vomiting.  Cuts or bruises which are slow to heal.  Tingling or numbness in the hands or feet. DIAGNOSIS Type 2 diabetes is frequently not diagnosed until complications of diabetes are present. Type 2 diabetes is diagnosed when symptoms or complications are present and when blood  glucose levels are increased. Your blood glucose level may be checked by one or more of the following blood tests:  A fasting blood glucose test. You will not be allowed to eat for at least 8 hours before a blood sample is taken.  A random blood glucose test. Your blood glucose is checked at any time of the day regardless of when you ate.  A hemoglobin A1c blood glucose test. A hemoglobin A1c test provides information about blood glucose control over the previous 3 months.  An oral glucose tolerance test (OGTT). Your blood glucose is measured after you have not eaten (fasted) for 2 hours and then after you drink a glucose-containing beverage. TREATMENT   You may need to take insulin or diabetes medicine daily to keep blood glucose levels in the desired range.  You will need to match insulin dosing with exercise and healthy food choices. The treatment goal is to maintain the before meal blood sugar (preprandial glucose) level at 70 130 mg/dL. HOME CARE INSTRUCTIONS   Have your hemoglobin A1c level checked twice a year.  Perform daily blood glucose monitoring as directed by your caregiver.  Monitor urine ketones when you are ill and as directed by your caregiver.  Take your diabetes medicine or insulin as directed by your caregiver to maintain your blood glucose levels in the desired range.  Never run out of diabetes medicine or insulin. It is needed every day.  Adjust insulin based on your intake of carbohydrates. Carbohydrates can raise blood glucose levels but need to be included in your diet. Carbohydrates provide vitamins, minerals, and fiber which are an essential part of   a healthy diet. Carbohydrates are found in fruits, vegetables, whole grains, dairy products, legumes, and foods containing added sugars.    Eat healthy foods. Alternate 3 meals with 3 snacks.  Lose weight if overweight.  Carry a medical alert card or wear your medical alert jewelry.  Carry a 15 gram  carbohydrate snack with you at all times to treat low blood glucose (hypoglycemia). Some examples of 15 gram carbohydrate snacks include:  Glucose tablets, 3 or 4   Glucose gel, 15 gram tube  Raisins, 2 tablespoons (24 grams)  Jelly beans, 6  Animal crackers, 8  Regular pop, 4 ounces (120 mL)  Gummy treats, 9  Recognize hypoglycemia. Hypoglycemia occurs with blood glucose levels of 70 mg/dL and below. The risk for hypoglycemia increases when fasting or skipping meals, during or after intense exercise, and during sleep. Hypoglycemia symptoms can include:  Tremors or shakes.  Decreased ability to concentrate.  Sweating.  Increased heart rate.  Headache.  Dry mouth.  Hunger.  Irritability.  Anxiety.  Restless sleep.  Altered speech or coordination.  Confusion.  Treat hypoglycemia promptly. If you are alert and able to safely swallow, follow the 15:15 rule:  Take 15 20 grams of rapid-acting glucose or carbohydrate. Rapid-acting options include glucose gel, glucose tablets, or 4 ounces (120 mL) of fruit juice, regular soda, or low fat milk.  Check your blood glucose level 15 minutes after taking the glucose.  Take 15 20 grams more of glucose if the repeat blood glucose level is still 70 mg/dL or below.  Eat a meal or snack within 1 hour once blood glucose levels return to normal.    Be alert to polyuria and polydipsia which are early signs of hyperglycemia. An early awareness of hyperglycemia allows for prompt treatment. Treat hyperglycemia as directed by your caregiver.  Engage in at least 150 minutes of moderate-intensity physical activity a week, spread over at least 3 days of the week or as directed by your caregiver. In addition, you should engage in resistance exercise at least 2 times a week or as directed by your caregiver.  Adjust your medicine and food intake as needed if you start a new exercise or sport.  Follow your sick day plan at any time you  are unable to eat or drink as usual.  Avoid tobacco use.  Limit alcohol intake to no more than 1 drink per day for nonpregnant women and 2 drinks per day for men. You should drink alcohol only when you are also eating food. Talk with your caregiver whether alcohol is safe for you. Tell your caregiver if you drink alcohol several times a week.  Follow up with your caregiver regularly.  Schedule an eye exam soon after the diagnosis of type 2 diabetes and then annually.  Perform daily skin and foot care. Examine your skin and feet daily for cuts, bruises, redness, nail problems, bleeding, blisters, or sores. A foot exam by a caregiver should be done annually.  Brush your teeth and gums at least twice a day and floss at least once a day. Follow up with your dentist regularly.  Share your diabetes management plan with your workplace or school.  Stay up-to-date with immunizations.  Learn to manage stress.  Obtain ongoing diabetes education and support as needed.  Participate in, or seek rehabilitation as needed to maintain or improve independence and quality of life. Request a physical or occupational therapy referral if you are having foot or hand numbness or difficulties with grooming,   dressing, eating, or physical activity. SEEK MEDICAL CARE IF:   You are unable to eat food or drink fluids for more than 6 hours.  You have nausea and vomiting for more than 6 hours.  Your blood glucose level is over 240 mg/dL.  There is a change in mental status.  You develop an additional serious illness.  You have diarrhea for more than 6 hours.  You have been sick or have had a fever for a couple of days and are not getting better.  You have pain during any physical activity.  SEEK IMMEDIATE MEDICAL CARE IF:  You have difficulty breathing.  You have moderate to large ketone levels. MAKE SURE YOU:  Understand these instructions.  Will watch your condition.  Will get help right away if  you are not doing well or get worse. Document Released: 05/24/2005 Document Revised: 02/16/2012 Document Reviewed: 12/21/2011 [employer] Patient Information 2014 [employer].  

## 2013-10-03 LAB — HM DIABETES EYE EXAM

## 2013-10-11 ENCOUNTER — Other Ambulatory Visit: Payer: Self-pay

## 2013-10-11 MED ORDER — LANCETS MISC: Status: DC

## 2013-10-11 MED ORDER — GLUCOSE BLOOD VI STRP: ORAL_STRIP | Status: DC

## 2013-11-05 ENCOUNTER — Other Ambulatory Visit: Payer: Self-pay | Admitting: Internal Medicine

## 2013-11-05 DIAGNOSIS — I10 Essential (primary) hypertension: Secondary | ICD-10-CM

## 2013-12-06 ENCOUNTER — Telehealth: Payer: Self-pay | Admitting: Internal Medicine

## 2013-12-06 NOTE — Telephone Encounter (Signed)
Pt scheduled a physical for Aug 3.

## 2013-12-06 NOTE — Telephone Encounter (Signed)
He needs to be seen for a physical

## 2013-12-06 NOTE — Telephone Encounter (Signed)
Per a message from my chart message to patient, pt wants to be referred for a mammogram.

## 2014-01-07 ENCOUNTER — Other Ambulatory Visit (INDEPENDENT_AMBULATORY_CARE_PROVIDER_SITE_OTHER): Payer: 59

## 2014-01-07 ENCOUNTER — Ambulatory Visit (INDEPENDENT_AMBULATORY_CARE_PROVIDER_SITE_OTHER): Payer: 59 | Admitting: Internal Medicine

## 2014-01-07 ENCOUNTER — Encounter: Payer: Self-pay | Admitting: Internal Medicine

## 2014-01-07 VITALS — BP 122/82 | HR 92 | Temp 98.5°F | Resp 16 | Ht 67.0 in | Wt 326.0 lb

## 2014-01-07 DIAGNOSIS — E785 Hyperlipidemia, unspecified: Secondary | ICD-10-CM

## 2014-01-07 DIAGNOSIS — Z0000 Encounter for general adult medical examination without abnormal findings: Secondary | ICD-10-CM

## 2014-01-07 DIAGNOSIS — E1165 Type 2 diabetes mellitus with hyperglycemia: Secondary | ICD-10-CM

## 2014-01-07 DIAGNOSIS — Z23 Encounter for immunization: Secondary | ICD-10-CM

## 2014-01-07 DIAGNOSIS — IMO0001 Reserved for inherently not codable concepts without codable children: Secondary | ICD-10-CM

## 2014-01-07 DIAGNOSIS — I10 Essential (primary) hypertension: Secondary | ICD-10-CM

## 2014-01-07 DIAGNOSIS — Z1231 Encounter for screening mammogram for malignant neoplasm of breast: Secondary | ICD-10-CM | POA: Insufficient documentation

## 2014-01-07 LAB — URINALYSIS, ROUTINE W REFLEX MICROSCOPIC
Bilirubin Urine: NEGATIVE
Ketones, ur: NEGATIVE
LEUKOCYTES UA: NEGATIVE
NITRITE: NEGATIVE
PH: 6 (ref 5.0–8.0)
SPECIFIC GRAVITY, URINE: 1.02 (ref 1.000–1.030)
Total Protein, Urine: NEGATIVE
UROBILINOGEN UA: 0.2 (ref 0.0–1.0)
Urine Glucose: NEGATIVE

## 2014-01-07 LAB — CBC WITH DIFFERENTIAL/PLATELET
BASOS PCT: 0.6 % (ref 0.0–3.0)
Basophils Absolute: 0 10*3/uL (ref 0.0–0.1)
EOS PCT: 6.1 % — AB (ref 0.0–5.0)
Eosinophils Absolute: 0.4 10*3/uL (ref 0.0–0.7)
HCT: 42.7 % (ref 36.0–46.0)
Hemoglobin: 13.7 g/dL (ref 12.0–15.0)
LYMPHS PCT: 39.4 % (ref 12.0–46.0)
Lymphs Abs: 2.4 10*3/uL (ref 0.7–4.0)
MCHC: 32 g/dL (ref 30.0–36.0)
MCV: 85.2 fl (ref 78.0–100.0)
MONOS PCT: 7.7 % (ref 3.0–12.0)
Monocytes Absolute: 0.5 10*3/uL (ref 0.1–1.0)
NEUTROS PCT: 46.2 % (ref 43.0–77.0)
Neutro Abs: 2.8 10*3/uL (ref 1.4–7.7)
Platelets: 272 10*3/uL (ref 150.0–400.0)
RBC: 5.01 Mil/uL (ref 3.87–5.11)
RDW: 15.7 % — ABNORMAL HIGH (ref 11.5–15.5)
WBC: 6 10*3/uL (ref 4.0–10.5)

## 2014-01-07 LAB — LIPID PANEL
CHOLESTEROL: 162 mg/dL (ref 0–200)
HDL: 40.4 mg/dL (ref >39.00)
LDL CALC: 105 mg/dL — AB (ref 0–99)
NonHDL: 121.6
TRIGLYCERIDES: 84 mg/dL (ref 0.0–149.0)
Total CHOL/HDL Ratio: 4
VLDL: 16.8 mg/dL (ref 0.0–40.0)

## 2014-01-07 LAB — COMPREHENSIVE METABOLIC PANEL
ALBUMIN: 3.3 g/dL — AB (ref 3.5–5.2)
ALK PHOS: 103 U/L (ref 39–117)
ALT: 15 U/L (ref 0–35)
AST: 17 U/L (ref 0–37)
BUN: 15 mg/dL (ref 6–23)
CALCIUM: 8.9 mg/dL (ref 8.4–10.5)
CO2: 29 mEq/L (ref 19–32)
Chloride: 103 mEq/L (ref 96–112)
Creatinine, Ser: 0.8 mg/dL (ref 0.4–1.2)
GFR: 98.88 mL/min (ref >60.00)
GLUCOSE: 136 mg/dL — AB (ref 70–99)
POTASSIUM: 4.4 meq/L (ref 3.5–5.1)
Sodium: 135 mEq/L (ref 135–145)
Total Bilirubin: 0.4 mg/dL (ref 0.2–1.2)
Total Protein: 7.5 g/dL (ref 6.0–8.3)

## 2014-01-07 LAB — TSH: TSH: 2.83 u[IU]/mL (ref 0.35–4.50)

## 2014-01-07 LAB — HEMOGLOBIN A1C: Hgb A1c MFr Bld: 7.5 % — ABNORMAL HIGH (ref 4.6–6.5)

## 2014-01-07 NOTE — Assessment & Plan Note (Signed)
Exam done Vaccines were reviewed and updated Labs ordered  She was referred for a mammo and colonoscopy Pt ed material was given

## 2014-01-07 NOTE — Progress Notes (Signed)
Pre visit review using our clinic review tool, if applicable. No additional management support is needed unless otherwise documented below in the visit note. 

## 2014-01-07 NOTE — Patient Instructions (Signed)
Preventive Care for Adults A healthy lifestyle and preventive care can promote health and wellness. Preventive health guidelines for women include the following key practices.  A routine yearly physical is a good way to check with your health care provider about your health and preventive screening. It is a chance to share any concerns and updates on your health and to receive a thorough exam.  Visit your dentist for a routine exam and preventive care every 6 months. Brush your teeth twice a day and floss once a day. Good oral hygiene prevents tooth decay and gum disease.  The frequency of eye exams is based on your age, health, family medical history, use of contact lenses, and other factors. Follow your health care provider's recommendations for frequency of eye exams.  Eat a healthy diet. Foods like vegetables, fruits, whole grains, low-fat dairy products, and lean protein foods contain the nutrients you need without too many calories. Decrease your intake of foods high in solid fats, added sugars, and salt. Eat the right amount of calories for you.Get information about a proper diet from your health care provider, if necessary.  Regular physical exercise is one of the most important things you can do for your health. Most adults should get at least 150 minutes of moderate-intensity exercise (any activity that increases your heart rate and causes you to sweat) each week. In addition, most adults need muscle-strengthening exercises on 2 or more days a week.  Maintain a healthy weight. The body mass index (BMI) is a screening tool to identify possible weight problems. It provides an estimate of body fat based on height and weight. Your health care provider can find your BMI and can help you achieve or maintain a healthy weight.For adults 20 years and older:  A BMI below 18.5 is considered underweight.  A BMI of 18.5 to 24.9 is normal.  A BMI of 25 to 29.9 is considered overweight.  A BMI of  30 and above is considered obese.  Maintain normal blood lipids and cholesterol levels by exercising and minimizing your intake of saturated fat. Eat a balanced diet with plenty of fruit and vegetables. Blood tests for lipids and cholesterol should begin at age 76 and be repeated every 5 years. If your lipid or cholesterol levels are high, you are over 50, or you are at high risk for heart disease, you may need your cholesterol levels checked more frequently.Ongoing high lipid and cholesterol levels should be treated with medicines if diet and exercise are not working.  If you smoke, find out from your health care provider how to quit. If you do not use tobacco, do not start.  Lung cancer screening is recommended for adults aged 22-80 years who are at high risk for developing lung cancer because of a history of smoking. A yearly low-dose CT scan of the lungs is recommended for people who have at least a 30-pack-year history of smoking and are a current smoker or have quit within the past 15 years. A pack year of smoking is smoking an average of 1 pack of cigarettes a day for 1 year (for example: 1 pack a day for 30 years or 2 packs a day for 15 years). Yearly screening should continue until the smoker has stopped smoking for at least 15 years. Yearly screening should be stopped for people who develop a health problem that would prevent them from having lung cancer treatment.  If you are pregnant, do not drink alcohol. If you are breastfeeding,  be very cautious about drinking alcohol. If you are not pregnant and choose to drink alcohol, do not have more than 1 drink per day. One drink is considered to be 12 ounces (355 mL) of beer, 5 ounces (148 mL) of wine, or 1.5 ounces (44 mL) of liquor.  Avoid use of street drugs. Do not share needles with anyone. Ask for help if you need support or instructions about stopping the use of drugs.  High blood pressure causes heart disease and increases the risk of  stroke. Your blood pressure should be checked at least every 1 to 2 years. Ongoing high blood pressure should be treated with medicines if weight loss and exercise do not work.  If you are 75-52 years old, ask your health care provider if you should take aspirin to prevent strokes.  Diabetes screening involves taking a blood sample to check your fasting blood sugar level. This should be done once every 3 years, after age 15, if you are within normal weight and without risk factors for diabetes. Testing should be considered at a younger age or be carried out more frequently if you are overweight and have at least 1 risk factor for diabetes.  Breast cancer screening is essential preventive care for women. You should practice "breast self-awareness." This means understanding the normal appearance and feel of your breasts and may include breast self-examination. Any changes detected, no matter how small, should be reported to a health care provider. Women in their 58s and 30s should have a clinical breast exam (CBE) by a health care provider as part of a regular health exam every 1 to 3 years. After age 16, women should have a CBE every year. Starting at age 53, women should consider having a mammogram (breast X-ray test) every year. Women who have a family history of breast cancer should talk to their health care provider about genetic screening. Women at a high risk of breast cancer should talk to their health care providers about having an MRI and a mammogram every year.  Breast cancer gene (BRCA)-related cancer risk assessment is recommended for women who have family members with BRCA-related cancers. BRCA-related cancers include breast, ovarian, tubal, and peritoneal cancers. Having family members with these cancers may be associated with an increased risk for harmful changes (mutations) in the breast cancer genes BRCA1 and BRCA2. Results of the assessment will determine the need for genetic counseling and  BRCA1 and BRCA2 testing.  Routine pelvic exams to screen for cancer are no longer recommended for nonpregnant women who are considered low risk for cancer of the pelvic organs (ovaries, uterus, and vagina) and who do not have symptoms. Ask your health care provider if a screening pelvic exam is right for you.  If you have had past treatment for cervical cancer or a condition that could lead to cancer, you need Pap tests and screening for cancer for at least 20 years after your treatment. If Pap tests have been discontinued, your risk factors (such as having a new sexual partner) need to be reassessed to determine if screening should be resumed. Some women have medical problems that increase the chance of getting cervical cancer. In these cases, your health care provider may recommend more frequent screening and Pap tests.  The HPV test is an additional test that may be used for cervical cancer screening. The HPV test looks for the virus that can cause the cell changes on the cervix. The cells collected during the Pap test can be  tested for HPV. The HPV test could be used to screen women aged 30 years and older, and should be used in women of any age who have unclear Pap test results. After the age of 30, women should have HPV testing at the same frequency as a Pap test.  Colorectal cancer can be detected and often prevented. Most routine colorectal cancer screening begins at the age of 50 years and continues through age 75 years. However, your health care provider may recommend screening at an earlier age if you have risk factors for colon cancer. On a yearly basis, your health care provider may provide home test kits to check for hidden blood in the stool. Use of a small camera at the end of a tube, to directly examine the colon (sigmoidoscopy or colonoscopy), can detect the earliest forms of colorectal cancer. Talk to your health care provider about this at age 50, when routine screening begins. Direct  exam of the colon should be repeated every 5-10 years through age 75 years, unless early forms of pre-cancerous polyps or small growths are found.  People who are at an increased risk for hepatitis B should be screened for this virus. You are considered at high risk for hepatitis B if:  You were born in a country where hepatitis B occurs often. Talk with your health care provider about which countries are considered high risk.  Your parents were born in a high-risk country and you have not received a shot to protect against hepatitis B (hepatitis B vaccine).  You have HIV or AIDS.  You use needles to inject street drugs.  You live with, or have sex with, someone who has hepatitis B.  You get hemodialysis treatment.  You take certain medicines for conditions like cancer, organ transplantation, and autoimmune conditions.  Hepatitis C blood testing is recommended for all people born from 1945 through 1965 and any individual with known risks for hepatitis C.  Practice safe sex. Use condoms and avoid high-risk sexual practices to reduce the spread of sexually transmitted infections (STIs). STIs include gonorrhea, chlamydia, syphilis, trichomonas, herpes, HPV, and human immunodeficiency virus (HIV). Herpes, HIV, and HPV are viral illnesses that have no cure. They can result in disability, cancer, and death.  You should be screened for sexually transmitted illnesses (STIs) including gonorrhea and chlamydia if:  You are sexually active and are younger than 24 years.  You are older than 24 years and your health care provider tells you that you are at risk for this type of infection.  Your sexual activity has changed since you were last screened and you are at an increased risk for chlamydia or gonorrhea. Ask your health care provider if you are at risk.  If you are at risk of being infected with HIV, it is recommended that you take a prescription medicine daily to prevent HIV infection. This is  called preexposure prophylaxis (PrEP). You are considered at risk if:  You are a heterosexual woman, are sexually active, and are at increased risk for HIV infection.  You take drugs by injection.  You are sexually active with a partner who has HIV.  Talk with your health care provider about whether you are at high risk of being infected with HIV. If you choose to begin PrEP, you should first be tested for HIV. You should then be tested every 3 months for as long as you are taking PrEP.  Osteoporosis is a disease in which the bones lose minerals and strength   with aging. This can result in serious bone fractures or breaks. The risk of osteoporosis can be identified using a bone density scan. Women ages 65 years and over and women at risk for fractures or osteoporosis should discuss screening with their health care providers. Ask your health care provider whether you should take a calcium supplement or vitamin D to reduce the rate of osteoporosis.  Menopause can be associated with physical symptoms and risks. Hormone replacement therapy is available to decrease symptoms and risks. You should talk to your health care provider about whether hormone replacement therapy is right for you.  Use sunscreen. Apply sunscreen liberally and repeatedly throughout the day. You should seek shade when your shadow is shorter than you. Protect yourself by wearing long sleeves, pants, a wide-brimmed hat, and sunglasses year round, whenever you are outdoors.  Once a month, do a whole body skin exam, using a mirror to look at the skin on your back. Tell your health care provider of new moles, moles that have irregular borders, moles that are larger than a pencil eraser, or moles that have changed in shape or color.  Stay current with required vaccines (immunizations).  Influenza vaccine. All adults should be immunized every year.  Tetanus, diphtheria, and acellular pertussis (Td, Tdap) vaccine. Pregnant women should  receive 1 dose of Tdap vaccine during each pregnancy. The dose should be obtained regardless of the length of time since the last dose. Immunization is preferred during the 27th-36th week of gestation. An adult who has not previously received Tdap or who does not know her vaccine status should receive 1 dose of Tdap. This initial dose should be followed by tetanus and diphtheria toxoids (Td) booster doses every 10 years. Adults with an unknown or incomplete history of completing a 3-dose immunization series with Td-containing vaccines should begin or complete a primary immunization series including a Tdap dose. Adults should receive a Td booster every 10 years.  Varicella vaccine. An adult without evidence of immunity to varicella should receive 2 doses or a second dose if she has previously received 1 dose. Pregnant females who do not have evidence of immunity should receive the first dose after pregnancy. This first dose should be obtained before leaving the health care facility. The second dose should be obtained 4-8 weeks after the first dose.  Human papillomavirus (HPV) vaccine. Females aged 13-26 years who have not received the vaccine previously should obtain the 3-dose series. The vaccine is not recommended for use in pregnant females. However, pregnancy testing is not needed before receiving a dose. If a female is found to be pregnant after receiving a dose, no treatment is needed. In that case, the remaining doses should be delayed until after the pregnancy. Immunization is recommended for any person with an immunocompromised condition through the age of 26 years if she did not get any or all doses earlier. During the 3-dose series, the second dose should be obtained 4-8 weeks after the first dose. The third dose should be obtained 24 weeks after the first dose and 16 weeks after the second dose.  Zoster vaccine. One dose is recommended for adults aged 60 years or older unless certain conditions are  present.  Measles, mumps, and rubella (MMR) vaccine. Adults born before 1957 generally are considered immune to measles and mumps. Adults born in 1957 or later should have 1 or more doses of MMR vaccine unless there is a contraindication to the vaccine or there is laboratory evidence of immunity to   each of the three diseases. A routine second dose of MMR vaccine should be obtained at least 28 days after the first dose for students attending postsecondary schools, health care workers, or international travelers. People who received inactivated measles vaccine or an unknown type of measles vaccine during 1963-1967 should receive 2 doses of MMR vaccine. People who received inactivated mumps vaccine or an unknown type of mumps vaccine before 1979 and are at high risk for mumps infection should consider immunization with 2 doses of MMR vaccine. For females of childbearing age, rubella immunity should be determined. If there is no evidence of immunity, females who are not pregnant should be vaccinated. If there is no evidence of immunity, females who are pregnant should delay immunization until after pregnancy. Unvaccinated health care workers born before 1957 who lack laboratory evidence of measles, mumps, or rubella immunity or laboratory confirmation of disease should consider measles and mumps immunization with 2 doses of MMR vaccine or rubella immunization with 1 dose of MMR vaccine.  Pneumococcal 13-valent conjugate (PCV13) vaccine. When indicated, a person who is uncertain of her immunization history and has no record of immunization should receive the PCV13 vaccine. An adult aged 19 years or older who has certain medical conditions and has not been previously immunized should receive 1 dose of PCV13 vaccine. This PCV13 should be followed with a dose of pneumococcal polysaccharide (PPSV23) vaccine. The PPSV23 vaccine dose should be obtained at least 8 weeks after the dose of PCV13 vaccine. An adult aged 19  years or older who has certain medical conditions and previously received 1 or more doses of PPSV23 vaccine should receive 1 dose of PCV13. The PCV13 vaccine dose should be obtained 1 or more years after the last PPSV23 vaccine dose.  Pneumococcal polysaccharide (PPSV23) vaccine. When PCV13 is also indicated, PCV13 should be obtained first. All adults aged 65 years and older should be immunized. An adult younger than age 65 years who has certain medical conditions should be immunized. Any person who resides in a nursing home or long-term care facility should be immunized. An adult smoker should be immunized. People with an immunocompromised condition and certain other conditions should receive both PCV13 and PPSV23 vaccines. People with human immunodeficiency virus (HIV) infection should be immunized as soon as possible after diagnosis. Immunization during chemotherapy or radiation therapy should be avoided. Routine use of PPSV23 vaccine is not recommended for American Indians, [place] Natives, or people younger than 65 years unless there are medical conditions that require PPSV23 vaccine. When indicated, people who have unknown immunization and have no record of immunization should receive PPSV23 vaccine. One-time revaccination 5 years after the first dose of PPSV23 is recommended for people aged 19-64 years who have chronic kidney failure, nephrotic syndrome, asplenia, or immunocompromised conditions. People who received 1-2 doses of PPSV23 before age 65 years should receive another dose of PPSV23 vaccine at age 65 years or later if at least 5 years have passed since the previous dose. Doses of PPSV23 are not needed for people immunized with PPSV23 at or after age 65 years.  Meningococcal vaccine. Adults with asplenia or persistent complement component deficiencies should receive 2 doses of quadrivalent meningococcal conjugate (MenACWY-D) vaccine. The doses should be obtained at least 2 months apart.  Microbiologists working with certain meningococcal bacteria, military recruits, people at risk during an outbreak, and people who travel to or live in countries with a high rate of meningitis should be immunized. A first-year college student up through age   21 years who is living in a residence hall should receive a dose if she did not receive a dose on or after her 16th birthday. Adults who have certain high-risk conditions should receive one or more doses of vaccine.  Hepatitis A vaccine. Adults who wish to be protected from this disease, have certain high-risk conditions, work with hepatitis A-infected animals, work in hepatitis A research labs, or travel to or work in countries with a high rate of hepatitis A should be immunized. Adults who were previously unvaccinated and who anticipate close contact with an international adoptee during the first 60 days after arrival in the [place] from a country with a high rate of hepatitis A should be immunized.  Hepatitis B vaccine. Adults who wish to be protected from this disease, have certain high-risk conditions, may be exposed to blood or other infectious body fluids, are household contacts or sex partners of hepatitis B positive people, are clients or workers in certain care facilities, or travel to or work in countries with a high rate of hepatitis B should be immunized.  Haemophilus influenzae type b (Hib) vaccine. A previously unvaccinated person with asplenia or sickle cell disease or having a scheduled splenectomy should receive 1 dose of Hib vaccine. Regardless of previous immunization, a recipient of a hematopoietic stem cell transplant should receive a 3-dose series 6-12 months after her successful transplant. Hib vaccine is not recommended for adults with HIV infection. Preventive Services / Frequency Ages 64 to 68 years  Blood pressure check.** / Every 1 to 2 years.  Lipid and cholesterol check.** / Every 5 years beginning at age  22.  Clinical breast exam.** / Every 3 years for women in their 88s and 53s.  BRCA-related cancer risk assessment.** / For women who have family members with a BRCA-related cancer (breast, ovarian, tubal, or peritoneal cancers).  Pap test.** / Every 2 years from ages 90 through 51. Every 3 years starting at age 21 through age 56 or 3 with a history of 3 consecutive normal Pap tests.  HPV screening.** / Every 3 years from ages 24 through ages 1 to 46 with a history of 3 consecutive normal Pap tests.  Hepatitis C blood test.** / For any individual with known risks for hepatitis C.  Skin self-exam. / Monthly.  Influenza vaccine. / Every year.  Tetanus, diphtheria, and acellular pertussis (Tdap, Td) vaccine.** / Consult your health care provider. Pregnant women should receive 1 dose of Tdap vaccine during each pregnancy. 1 dose of Td every 10 years.  Varicella vaccine.** / Consult your health care provider. Pregnant females who do not have evidence of immunity should receive the first dose after pregnancy.  HPV vaccine. / 3 doses over 6 months, if 72 and younger. The vaccine is not recommended for use in pregnant females. However, pregnancy testing is not needed before receiving a dose.  Measles, mumps, rubella (MMR) vaccine.** / You need at least 1 dose of MMR if you were born in 1957 or later. You may also need a 2nd dose. For females of childbearing age, rubella immunity should be determined. If there is no evidence of immunity, females who are not pregnant should be vaccinated. If there is no evidence of immunity, females who are pregnant should delay immunization until after pregnancy.  Pneumococcal 13-valent conjugate (PCV13) vaccine.** / Consult your health care provider.  Pneumococcal polysaccharide (PPSV23) vaccine.** / 1 to 2 doses if you smoke cigarettes or if you have certain conditions.  Meningococcal vaccine.** /  1 dose if you are age 19 to 21 years and a first-year college  student living in a residence hall, or have one of several medical conditions, you need to get vaccinated against meningococcal disease. You may also need additional booster doses.  Hepatitis A vaccine.** / Consult your health care provider.  Hepatitis B vaccine.** / Consult your health care provider.  Haemophilus influenzae type b (Hib) vaccine.** / Consult your health care provider. Ages 40 to 64 years  Blood pressure check.** / Every 1 to 2 years.  Lipid and cholesterol check.** / Every 5 years beginning at age 20 years.  Lung cancer screening. / Every year if you are aged 55-80 years and have a 30-pack-year history of smoking and currently smoke or have quit within the past 15 years. Yearly screening is stopped once you have quit smoking for at least 15 years or develop a health problem that would prevent you from having lung cancer treatment.  Clinical breast exam.** / Every year after age 40 years.  BRCA-related cancer risk assessment.** / For women who have family members with a BRCA-related cancer (breast, ovarian, tubal, or peritoneal cancers).  Mammogram.** / Every year beginning at age 40 years and continuing for as long as you are in good health. Consult with your health care provider.  Pap test.** / Every 3 years starting at age 30 years through age 65 or 70 years with a history of 3 consecutive normal Pap tests.  HPV screening.** / Every 3 years from ages 30 years through ages 65 to 70 years with a history of 3 consecutive normal Pap tests.  Fecal occult blood test (FOBT) of stool. / Every year beginning at age 50 years and continuing until age 75 years. You may not need to do this test if you get a colonoscopy every 10 years.  Flexible sigmoidoscopy or colonoscopy.** / Every 5 years for a flexible sigmoidoscopy or every 10 years for a colonoscopy beginning at age 50 years and continuing until age 75 years.  Hepatitis C blood test.** / For all people born from 1945 through  1965 and any individual with known risks for hepatitis C.  Skin self-exam. / Monthly.  Influenza vaccine. / Every year.  Tetanus, diphtheria, and acellular pertussis (Tdap/Td) vaccine.** / Consult your health care provider. Pregnant women should receive 1 dose of Tdap vaccine during each pregnancy. 1 dose of Td every 10 years.  Varicella vaccine.** / Consult your health care provider. Pregnant females who do not have evidence of immunity should receive the first dose after pregnancy.  Zoster vaccine.** / 1 dose for adults aged 60 years or older.  Measles, mumps, rubella (MMR) vaccine.** / You need at least 1 dose of MMR if you were born in 1957 or later. You may also need a 2nd dose. For females of childbearing age, rubella immunity should be determined. If there is no evidence of immunity, females who are not pregnant should be vaccinated. If there is no evidence of immunity, females who are pregnant should delay immunization until after pregnancy.  Pneumococcal 13-valent conjugate (PCV13) vaccine.** / Consult your health care provider.  Pneumococcal polysaccharide (PPSV23) vaccine.** / 1 to 2 doses if you smoke cigarettes or if you have certain conditions.  Meningococcal vaccine.** / Consult your health care provider.  Hepatitis A vaccine.** / Consult your health care provider.  Hepatitis B vaccine.** / Consult your health care provider.  Haemophilus influenzae type b (Hib) vaccine.** / Consult your health care provider. Ages 65   years and over  Blood pressure check.** / Every 1 to 2 years.  Lipid and cholesterol check.** / Every 5 years beginning at age 41 years.  Lung cancer screening. / Every year if you are aged 18-80 years and have a 30-pack-year history of smoking and currently smoke or have quit within the past 15 years. Yearly screening is stopped once you have quit smoking for at least 15 years or develop a health problem that would prevent you from having lung cancer  treatment.  Clinical breast exam.** / Every year after age 45 years.  BRCA-related cancer risk assessment.** / For women who have family members with a BRCA-related cancer (breast, ovarian, tubal, or peritoneal cancers).  Mammogram.** / Every year beginning at age 76 years and continuing for as long as you are in good health. Consult with your health care provider.  Pap test.** / Every 3 years starting at age 20 years through age 22 or 38 years with 3 consecutive normal Pap tests. Testing can be stopped between 65 and 70 years with 3 consecutive normal Pap tests and no abnormal Pap or HPV tests in the past 10 years.  HPV screening.** / Every 3 years from ages 61 years through ages 2 or 42 years with a history of 3 consecutive normal Pap tests. Testing can be stopped between 65 and 70 years with 3 consecutive normal Pap tests and no abnormal Pap or HPV tests in the past 10 years.  Fecal occult blood test (FOBT) of stool. / Every year beginning at age 6 years and continuing until age 28 years. You may not need to do this test if you get a colonoscopy every 10 years.  Flexible sigmoidoscopy or colonoscopy.** / Every 5 years for a flexible sigmoidoscopy or every 10 years for a colonoscopy beginning at age 39 years and continuing until age 68 years.  Hepatitis C blood test.** / For all people born from [birth year] through 1967 and any individual with known risks for hepatitis C.  Osteoporosis screening.** / A one-time screening for women ages 19 years and over and women at risk for fractures or osteoporosis.  Skin self-exam. / Monthly.  Influenza vaccine. / Every year.  Tetanus, diphtheria, and acellular pertussis (Tdap/Td) vaccine.** / 1 dose of Td every 10 years.  Varicella vaccine.** / Consult your health care provider.  Zoster vaccine.** / 1 dose for adults aged 37 years or older.  Pneumococcal 13-valent conjugate (PCV13) vaccine.** / Consult your health care provider.  Pneumococcal  polysaccharide (PPSV23) vaccine.** / 1 dose for all adults aged 60 years and older.  Meningococcal vaccine.** / Consult your health care provider.  Hepatitis A vaccine.** / Consult your health care provider.  Hepatitis B vaccine.** / Consult your health care provider.  Haemophilus influenzae type b (Hib) vaccine.** / Consult your health care provider. ** Family history and personal history of risk and conditions may change your health care provider's recommendations. Document Released: 07/20/2001 Document Revised: 10/08/2013 Document Reviewed: 10/19/2010 [facility] Patient Information 2015 [place], [place]. This information is not intended to replace advice given to you by your health care provider. Make sure you discuss any questions you have with your health care provider. Type 2 Diabetes Mellitus Type 2 diabetes mellitus, often simply referred to as type 2 diabetes, is a long-lasting (chronic) disease. In type 2 diabetes, the pancreas does not make enough insulin (a hormone), the cells are less responsive to the insulin that is made (insulin resistance), or both. Normally, insulin moves sugars from  food into the tissue cells. The tissue cells use the sugars for energy. The lack of insulin or the lack of normal response to insulin causes excess sugars to build up in the blood instead of going into the tissue cells. As a result, high blood sugar (hyperglycemia) develops. The effect of high sugar (glucose) levels can cause many complications. Type 2 diabetes was also previously called adult-onset diabetes, but it can occur at any age.  RISK FACTORS  A person is predisposed to developing type 2 diabetes if someone in the family has the disease and also has one or more of the following primary risk factors:  Overweight.  An inactive lifestyle.  A history of consistently eating high-calorie foods. Maintaining a normal weight and regular physical activity can reduce the chance of developing type 2  diabetes. SYMPTOMS  A person with type 2 diabetes may not show symptoms initially. The symptoms of type 2 diabetes appear slowly. The symptoms include:  Increased thirst (polydipsia).  Increased urination (polyuria).  Increased urination during the night (nocturia).  Weight loss. This weight loss may be rapid.  Frequent, recurring infections.  Tiredness (fatigue).  Weakness.  Vision changes, such as blurred vision.  Fruity smell to your breath.  Abdominal pain.  Nausea or vomiting.  Cuts or bruises which are slow to heal.  Tingling or numbness in the hands or feet. DIAGNOSIS Type 2 diabetes is frequently not diagnosed until complications of diabetes are present. Type 2 diabetes is diagnosed when symptoms or complications are present and when blood glucose levels are increased. Your blood glucose level may be checked by one or more of the following blood tests:  A fasting blood glucose test. You will not be allowed to eat for at least 8 hours before a blood sample is taken.  A random blood glucose test. Your blood glucose is checked at any time of the day regardless of when you ate.  A hemoglobin A1c blood glucose test. A hemoglobin A1c test provides information about blood glucose control over the previous 3 months.  An oral glucose tolerance test (OGTT). Your blood glucose is measured after you have not eaten (fasted) for 2 hours and then after you drink a glucose-containing beverage. TREATMENT   You may need to take insulin or diabetes medicine daily to keep blood glucose levels in the desired range.  If you use insulin, you may need to adjust the dosage depending on the carbohydrates that you eat with each meal or snack. The treatment goal is to maintain the before meal blood sugar (preprandial glucose) level at 70-130 mg/dL. HOME CARE INSTRUCTIONS   Have your hemoglobin A1c level checked twice a year.  Perform daily blood glucose monitoring as directed by your  health care provider.  Monitor urine ketones when you are ill and as directed by your health care provider.  Take your diabetes medicine or insulin as directed by your health care provider to maintain your blood glucose levels in the desired range.  Never run out of diabetes medicine or insulin. It is needed every day.  If you are using insulin, you may need to adjust the amount of insulin given based on your intake of carbohydrates. Carbohydrates can raise blood glucose levels but need to be included in your diet. Carbohydrates provide vitamins, minerals, and fiber which are an essential part of a healthy diet. Carbohydrates are found in fruits, vegetables, whole grains, dairy products, legumes, and foods containing added sugars.  Eat healthy foods. You should make   an appointment to see a registered dietitian to help you create an eating plan that is right for you.  Lose weight if you are overweight.  Carry a medical alert card or wear your medical alert jewelry.  Carry a 15-gram carbohydrate snack with you at all times to treat low blood glucose (hypoglycemia). Some examples of 15-gram carbohydrate snacks include:  Glucose tablets, 3 or 4.  Glucose gel, 15-gram tube.  Raisins, 2 tablespoons (24 grams).  Jelly beans, 6.  Animal crackers, 8.  Regular pop, 4 ounces (120 mL).  Gummy treats, 9.  Recognize hypoglycemia. Hypoglycemia occurs with blood glucose levels of 70 mg/dL and below. The risk for hypoglycemia increases when fasting or skipping meals, during or after intense exercise, and during sleep. Hypoglycemia symptoms can include:  Tremors or shakes.  Decreased ability to concentrate.  Sweating.  Increased heart rate.  Headache.  Dry mouth.  Hunger.  Irritability.  Anxiety.  Restless sleep.  Altered speech or coordination.  Confusion.  Treat hypoglycemia promptly. If you are alert and able to safely swallow, follow the 15:15 rule:  Take 15-20 grams of  rapid-acting glucose or carbohydrate. Rapid-acting options include glucose gel, glucose tablets, or 4 ounces (120 mL) of fruit juice, regular soda, or low-fat milk.  Check your blood glucose level 15 minutes after taking the glucose.  Take 15-20 grams more of glucose if the repeat blood glucose level is still 70 mg/dL or below.  Eat a meal or snack within 1 hour once blood glucose levels return to normal.  Be alert to feeling very thirsty and urinating more frequently than usual, which are early signs of hyperglycemia. An early awareness of hyperglycemia allows for prompt treatment. Treat hyperglycemia as directed by your health care provider.  Engage in at least 150 minutes of moderate-intensity physical activity a week, spread over at least 3 days of the week or as directed by your health care provider. In addition, you should engage in resistance exercise at least 2 times a week or as directed by your health care provider. Try to spend no more than 90 minutes at one time inactive.  Adjust your medicine and food intake as needed if you start a new exercise or sport.  Follow your sick-day plan anytime you are unable to eat or drink as usual.  Do not use any tobacco products including cigarettes, chewing tobacco, or electronic cigarettes. If you need help quitting, ask your health care provider.  Limit alcohol intake to no more than 1 drink per day for nonpregnant women and 2 drinks per day for men. You should drink alcohol only when you are also eating food. Talk with your health care provider whether alcohol is safe for you. Tell your health care provider if you drink alcohol several times a week.  Keep all follow-up visits as directed by your health care provider. This is important.  Schedule an eye exam soon after the diagnosis of type 2 diabetes and then annually.  Perform daily skin and foot care. Examine your skin and feet daily for cuts, bruises, redness, nail problems, bleeding,  blisters, or sores. A foot exam by a health care provider should be done annually.  Brush your teeth and gums at least twice a day and floss at least once a day. Follow up with your dentist regularly.  Share your diabetes management plan with your workplace or school.  Stay up-to-date with immunizations. It is recommended that people with diabetes who are over 53 years old get  the pneumonia vaccine. In some cases, two separate shots may be given. Ask your health care provider if your pneumonia vaccination is up-to-date.  Learn to manage stress.  Obtain ongoing diabetes education and support as needed.  Participate in or seek rehabilitation as needed to maintain or improve independence and quality of life. Request a physical or occupational therapy referral if you are having foot or hand numbness, or difficulties with grooming, dressing, eating, or physical activity. SEEK MEDICAL CARE IF:   You are unable to eat food or drink fluids for more than 6 hours.  You have nausea and vomiting for more than 6 hours.  Your blood glucose level is over 240 mg/dL.  There is a change in mental status.  You develop an additional serious illness.  You have diarrhea for more than 6 hours.  You have been sick or have had a fever for a couple of days and are not getting better.  You have pain during any physical activity.  SEEK IMMEDIATE MEDICAL CARE IF:  You have difficulty breathing.  You have moderate to large ketone levels. MAKE SURE YOU:  Understand these instructions.  Will watch your condition.  Will get help right away if you are not doing well or get worse. Document Released: 05/24/2005 Document Revised: 10/08/2013 Document Reviewed: 12/21/2011 [facility] Patient Information 2015 [place], [place]. This information is not intended to replace advice given to you by your health care provider. Make sure you discuss any questions you have with your health care provider.

## 2014-01-07 NOTE — Assessment & Plan Note (Signed)
She is doing well on pravachol 

## 2014-01-07 NOTE — Assessment & Plan Note (Signed)
Her BP is well controlled 

## 2014-01-07 NOTE — Progress Notes (Signed)
Subjective:    Patient ID: Lorraine Holt, female    DOB: [birth year], 48 y.o.   MRN: [id number]  Diabetes She presents for her follow-up diabetic visit. She has type 2 diabetes mellitus. Her disease course has been stable. There are no hypoglycemic associated symptoms. Pertinent negatives for hypoglycemia include no confusion. Pertinent negatives for diabetes include no blurred vision, no chest pain, no fatigue, no foot paresthesias, no foot ulcerations, no polydipsia, no polyphagia, no polyuria, no visual change, no weakness and no weight loss. There are no hypoglycemic complications. Symptoms are stable. There are no diabetic complications. Current diabetic treatment includes oral agent (monotherapy). She is compliant with treatment most of the time. Her weight is stable. She is following a generally healthy diet. Meal planning includes avoidance of concentrated sweets. She has not had a previous visit with a dietician. She participates in exercise intermittently. There is no change in her home blood glucose trend. An ACE inhibitor/angiotensin II receptor blocker is being taken. She does not see a podiatrist.Eye exam is not current.      Review of Systems  Constitutional: Negative.  Negative for chills, weight loss, diaphoresis, appetite change and fatigue.  HENT: Negative.   Eyes: Negative.  Negative for blurred vision.  Respiratory: Negative.  Negative for apnea, cough, choking, chest tightness, shortness of breath, wheezing and stridor.   Cardiovascular: Negative.  Negative for chest pain, palpitations and leg swelling.  Gastrointestinal: Negative.  Negative for nausea, vomiting, abdominal pain, diarrhea, constipation and blood in stool.  Endocrine: Negative.  Negative for polydipsia, polyphagia and polyuria.  Genitourinary: Negative.   Musculoskeletal: Negative.  Negative for arthralgias, back pain, gait problem, joint swelling, myalgias, neck pain and neck stiffness.  Skin: Negative.     Allergic/Immunologic: Negative.   Neurological: Negative.  Negative for weakness.  Hematological: Negative.  Negative for adenopathy. Does not bruise/bleed easily.  Psychiatric/Behavioral: Negative.  Negative for confusion.       Objective:   Physical Exam  Vitals reviewed. Constitutional: She is oriented to person, place, and time. She appears well-developed and well-nourished. No distress.  HENT:  Head: Normocephalic and atraumatic.  Mouth/Throat: Oropharynx is clear and moist. No oropharyngeal exudate.  Eyes: Conjunctivae are normal. Right eye exhibits no discharge. Left eye exhibits no discharge. No scleral icterus.  Neck: Normal range of motion. Neck supple. No JVD present. No tracheal deviation present. No thyromegaly present.  Cardiovascular: Normal rate, regular rhythm, normal heart sounds and intact distal pulses.  Exam reveals no gallop and no friction rub.   No murmur heard. Pulmonary/Chest: Effort normal and breath sounds normal. No accessory muscle usage or stridor. Not tachypneic. No respiratory distress. She has no wheezes. She has no rales. Chest wall is not dull to percussion. She exhibits no mass, no tenderness, no bony tenderness, no laceration, no crepitus, no edema, no deformity, no swelling and no retraction. Right breast exhibits no inverted nipple, no mass, no nipple discharge, no skin change and no tenderness. Left breast exhibits no inverted nipple, no mass, no nipple discharge, no skin change and no tenderness. Breasts are symmetrical.  Abdominal: Soft. Bowel sounds are normal. She exhibits no distension and no mass. There is no tenderness. There is no rebound and no guarding.  Musculoskeletal: Normal range of motion. She exhibits no edema and no tenderness.  Lymphadenopathy:    She has no cervical adenopathy.  Neurological: She is oriented to person, place, and time.  Skin: Skin is warm and dry. No rash noted. She  is not diaphoretic. No erythema. No pallor.   Psychiatric: She has a normal mood and affect. Her behavior is normal. Judgment and thought content normal.     Lab Results  Component Value Date   WBC 6.3 07/12/2012   HGB 12.9 07/12/2012   HCT 39.3 07/12/2012   PLT 272.0 07/12/2012   GLUCOSE 111* 08/08/2013   CHOL 183 08/08/2013   TRIG 109.0 08/08/2013   HDL 39.70 08/08/2013   LDLCALC 122* 08/08/2013   ALT 13 07/12/2012   AST 15 07/12/2012   NA 136 08/08/2013   K 3.8 08/08/2013   CL 103 08/08/2013   CREATININE 0.8 08/08/2013   BUN 10 08/08/2013   CO2 27 08/08/2013   TSH 3.51 08/08/2013   HGBA1C 7.0* 08/08/2013   MICROALBUR 1.1 08/08/2013       Assessment & Plan:

## 2014-01-07 NOTE — Assessment & Plan Note (Signed)
Her Blood sugars have been well controlled I will monitor her renal function today

## 2014-01-17 ENCOUNTER — Ambulatory Visit
Admission: RE | Admit: 2014-01-17 | Discharge: 2014-01-17 | Disposition: A | Discharge status: Home / Self Care | Payer: 59 | Source: Ambulatory Visit | Attending: Internal Medicine | Admitting: Internal Medicine

## 2014-01-17 DIAGNOSIS — Z1231 Encounter for screening mammogram for malignant neoplasm of breast: Secondary | ICD-10-CM

## 2014-01-17 LAB — HM MAMMOGRAPHY: HM Mammogram: ABNORMAL

## 2014-01-18 ENCOUNTER — Other Ambulatory Visit: Payer: Self-pay | Admitting: Internal Medicine

## 2014-01-18 DIAGNOSIS — R928 Other abnormal and inconclusive findings on diagnostic imaging of breast: Secondary | ICD-10-CM

## 2014-01-25 ENCOUNTER — Telehealth: Payer: Self-pay | Admitting: Internal Medicine

## 2014-01-25 ENCOUNTER — Ambulatory Visit
Admission: RE | Admit: 2014-01-25 | Discharge: 2014-01-25 | Disposition: A | Discharge status: Home / Self Care | Payer: 59 | Source: Ambulatory Visit | Attending: Internal Medicine | Admitting: Internal Medicine

## 2014-01-25 ENCOUNTER — Other Ambulatory Visit: Payer: Self-pay | Admitting: Internal Medicine

## 2014-01-25 DIAGNOSIS — R928 Other abnormal and inconclusive findings on diagnostic imaging of breast: Secondary | ICD-10-CM

## 2014-01-25 LAB — HM MAMMOGRAPHY: HM MAMMO: ABNORMAL

## 2014-01-25 MED ORDER — LEVOFLOXACIN 500 MG PO TABS: 500.0000 mg | ORAL_TABLET | Freq: Every day | ORAL | Status: DC

## 2014-01-25 NOTE — Telephone Encounter (Signed)
Start levaquin

## 2014-01-25 NOTE — Telephone Encounter (Signed)
Pt states that she was seen by Dr. Owens Shark today at the breast center due to swollen, red, tender left breast. Per pt, biopsy will be done later but in the interim was advised by Dr. Owens Shark to contact you for an abx. Thanks

## 2014-01-25 NOTE — Telephone Encounter (Signed)
Patient states DR. Owens Shark was to get in contact with Dr. Ronnald Ramp on calling in a script for her.  Patient would like follow up call.  Patient will wait in lobby.

## 2014-01-25 NOTE — Telephone Encounter (Signed)
Patient notified

## 2014-01-28 ENCOUNTER — Telehealth: Payer: Self-pay | Admitting: Internal Medicine

## 2014-01-28 NOTE — Telephone Encounter (Signed)
Patient Information:  Caller Name: Tinesha  Phone: [phone]  Patient: Lorraine Holt  Gender: Female  DOB: 1965/11/02  Age: 48 Years  PCP: Scarlette Calico (Adults only)  Pregnant: No  Office Follow Up:  Does the office need to follow up with this patient?: Yes  Instructions For The Office: Patient states she cannot go to another [place] office.  Office is booked .  Please contact for appt scheduling. See today in office.  RN Note:  Patient states she cannot go to another [employer] office.  Office is booked .  Please contact for appt scheduling. See today in office.  Symptoms  Reason For Call & Symptoms: Patient states she was at the [facility] Friday 01/25/14. Red Left breast with knot at the nipple . Size 50cent piece.  Dr. Owens Shark at breast center notified office and Dr. Ronnald Ramp called in Rx for patient.  Patient is taking Levaquin 500mg  once daily. Patient states the breast is worse, unable to wear a bra and feels that she cannot work. (1) She would like to have breast examined (2) LOA paperwork for her job.   She feels warm has not taken temperature. Breast has progressed from small red area, nipple is now red and more swollen. No drainage.  Reviewed Health History In EMR: Yes  Reviewed Medications In EMR: Yes  Reviewed Allergies In EMR: Yes  Reviewed Surgeries / Procedures: Yes  Date of Onset of Symptoms: 01/19/2014  Treatments Tried: Levaquin  Treatments Tried Worked: No  Any Fever: Yes  Fever Taken: Tactile  Fever Time Of Reading: 20:00:00  Fever Last Reading: N/A OB / GYN:  LMP: 01/07/2014  Guideline(s) Used:  Skin Lesion - Moles or Growths  Disposition Per Guideline:   See Today in Office  Reason For Disposition Reached:   Looks infected (e.g., spreading redness, pus, red streak)  Advice Given:  Call Back If:  Fever or pain occurs  You become worse.  Patient Will Follow Care Advice:  YES

## 2014-01-28 NOTE — Telephone Encounter (Signed)
Pt scheduled for biopsy @ [facility] this Thursday, 01/31/14, @ 7:30am. Left message for patient to call office.

## 2014-01-28 NOTE — Telephone Encounter (Signed)
She has been referred 

## 2014-01-28 NOTE — Telephone Encounter (Signed)
Breast Center called as well stating that they could not do stereo b/c of her weight.  States that she needs to be referred to [facility]

## 2014-01-28 NOTE — Telephone Encounter (Signed)
Pt aware of appt.

## 2014-01-29 ENCOUNTER — Telehealth: Payer: Self-pay | Admitting: Nurse Practitioner

## 2014-01-29 ENCOUNTER — Other Ambulatory Visit: Payer: Self-pay | Admitting: Internal Medicine

## 2014-01-29 ENCOUNTER — Encounter: Payer: Self-pay | Admitting: Nurse Practitioner

## 2014-01-29 ENCOUNTER — Ambulatory Visit (INDEPENDENT_AMBULATORY_CARE_PROVIDER_SITE_OTHER): Payer: 59 | Admitting: Nurse Practitioner

## 2014-01-29 VITALS — BP 102/70 | HR 107 | Temp 98.4°F | Resp 18 | Wt 319.0 lb

## 2014-01-29 DIAGNOSIS — E1165 Type 2 diabetes mellitus with hyperglycemia: Secondary | ICD-10-CM

## 2014-01-29 DIAGNOSIS — L0291 Cutaneous abscess, unspecified: Secondary | ICD-10-CM

## 2014-01-29 DIAGNOSIS — IMO0001 Reserved for inherently not codable concepts without codable children: Secondary | ICD-10-CM

## 2014-01-29 DIAGNOSIS — L0390 Cellulitis, unspecified: Principal | ICD-10-CM

## 2014-01-29 DIAGNOSIS — N644 Mastodynia: Secondary | ICD-10-CM

## 2014-01-29 MED ORDER — OXYCODONE-ACETAMINOPHEN 7.5-325 MG PO TABS: 1.0000 | ORAL_TABLET | ORAL | Status: DC | PRN

## 2014-01-29 MED ORDER — PROMETHAZINE HCL 12.5 MG PO TABS: 12.5000 mg | ORAL_TABLET | Freq: Four times a day (QID) | ORAL | Status: DC | PRN

## 2014-01-29 MED ORDER — SULFAMETHOXAZOLE-TMP DS 800-160 MG PO TABS: 1.0000 | ORAL_TABLET | Freq: Two times a day (BID) | ORAL | Status: DC

## 2014-01-29 NOTE — Assessment & Plan Note (Signed)
HbA1c 7.5 on most recent.  Discussed with patient importance of keeping low BG in view of infection of Left breast.  Patient verbalizes understanding is adhering to no sugar no gluten diet.

## 2014-01-29 NOTE — Progress Notes (Signed)
Pre visit review using our clinic review tool, if applicable. No additional management support is needed unless otherwise documented below in the visit note. 

## 2014-01-29 NOTE — Progress Notes (Signed)
Subjective:    Patient ID: Lorraine Holt, female    DOB: [birth year], 48 y.o.   MRN: [id number]  HPI Patient is seen for follow up regarding painful area on Left breast just above and including nipple. Patient reports a 2 week history of painful left breast since having mammograms performed on 01/17/14 and 01/24/14. Pain, redness, inflammation, and now pus, non draining, noted at area.  Patient began Levaquin on 01/25/14 but hasn't observed much  Improvement.  Patient works as a Electrical engineer at [employer] but has been unable to work due to the pain associated with illness.   She is due to have a biopsy on left breast on 01/31/14 due to abnormality on mammogram. Home Blood glucose 114 this am.     Review of Systems  Constitutional: Negative.   Respiratory: Negative.   Cardiovascular: Negative.   Skin: Positive for wound. Negative for rash.       Left breast painful and swollen        Past Medical History  Diagnosis Date  . Hypertension   . Diabetes mellitus   . Obesity   . Hyperlipidemia   . Bronchitis     History   Social History  . Marital Status: Married    Spouse Name: N/A    Number of Children: N/A  . Years of Education: N/A   Occupational History  . Not on file.   Social History Main Topics  . Smoking status: Current Every Day Smoker -- 0.50 packs/day for 20 years    Types: Cigarettes  . Smokeless tobacco: Never Used  . Alcohol Use: No  . Drug Use: No  . Sexual Activity: Yes    Birth Control/ Protection: None   Other Topics Concern  . Not on file   Social History Narrative  . No narrative on file    Past Surgical History  Procedure Laterality Date  . Tubal ligation    . Dilation and curettage of uterus      Family History  Problem Relation Age of Onset  . Hypertension Mother   . Diabetes Mother   . Cancer Neg Hx   . COPD Neg Hx   . Early death Neg Hx   . Heart disease Neg Hx   . Hyperlipidemia Neg Hx   . Kidney disease Neg Hx   . Stroke Neg  Hx     Allergies  Allergen Reactions  . Penicillins Swelling and Rash  . Amlodipine     Constipation, dizziness    Current Outpatient Prescriptions on File Prior to Visit  Medication Sig Dispense Refill  . glucose blood test strip Test up to twice daily  100 each  12  . hydrochlorothiazide (MICROZIDE) 12.5 MG capsule Take 1 capsule (12.5 mg total) by mouth daily.  30 capsule  11  . Lancets MISC BID  100 each  0  . losartan (COZAAR) 100 MG tablet Take 1 tablet (100 mg total) by mouth daily.  30 tablet  11  . metFORMIN (GLUCOPHAGE) 500 MG tablet Take 1 tablet (500 mg total) by mouth 2 (two) times daily with a meal.  60 tablet  11  . pravastatin (PRAVACHOL) 10 MG tablet Take 1 tablet (10 mg total) by mouth daily.  30 tablet  11   No current facility-administered medications on file prior to visit.    BP 102/70  Pulse 107  Temp(Src) 98.4 F (36.9 C) (Oral)  Resp 18  Wt 319 lb (144.697 kg)  SpO2  97%  LMP 01/06/2014    Objective:   Physical Exam  Constitutional: She appears well-developed and well-nourished.  HENT:  Head: Normocephalic.  Cardiovascular: Normal rate, regular rhythm and normal heart sounds.   Pulse rechecked 96  Pulmonary/Chest: Effort normal and breath sounds normal.  Skin: Skin is warm and dry.  Left breast with large indurated area noted over left nipple and lateral breast.  Purulent fluid noted under Left nipple, non draining approximately 3 cm.  Lateral breast erythematous, with increased temperature at site.  No lymphadenopathy noted in left axilla.  Psychiatric: She has a normal mood and affect. Her behavior is normal.          Assessment & Plan:  1. Cellulitis and abscess Consulted with Dr. Ronnald Ramp regarding patient.  V.O. Stop Levaquin Start Bactrim. Patient given medication for pain also.  She will return to office on Friday to have follow up with Dr. Ronnald Ramp for possible I&D. - sulfamethoxazole-trimethoprim (BACTRIM DS) 800-160 MG per tablet; Take  1 tablet by mouth 2 (two) times daily.  Dispense: 20 tablet; Refill: 0  2. Type II or unspecified type diabetes mellitus without mention of complication, uncontrolled Continue current medications.kkDiscussed with patient to  Keep blood glucose in range.  3. Breast pain, left Patient to call and cancel biopsy planned for 8/27. Call clinic with symptoms that worsen, develop fever or chills.

## 2014-01-29 NOTE — Telephone Encounter (Signed)
Patient dropped off FMLA forms with you this morning.  If you still have the forms she would like you to fax directly to [phone] attn: Danae Chen Rogis

## 2014-01-29 NOTE — Patient Instructions (Signed)
Follow up appointment on Friday with Dr. Ronnald Ramp Call clinic if symptoms worsen or not resolved or if you develop a fever or chills         Cellulitis Cellulitis is an infection of the skin and the tissue beneath it. The infected area is usually red and tender. Cellulitis occurs most often in the arms and lower legs.  CAUSES  Cellulitis is caused by bacteria that enter the skin through cracks or cuts in the skin. The most common types of bacteria that cause cellulitis are staphylococci and streptococci. SIGNS AND SYMPTOMS   Redness and warmth.  Swelling.  Tenderness or pain.  Fever. DIAGNOSIS  Your health care provider can usually determine what is wrong based on a physical exam. Blood tests may also be done. TREATMENT  Treatment usually involves taking an antibiotic medicine. HOME CARE INSTRUCTIONS   Take your antibiotic medicine as directed by your health care provider. Finish the antibiotic even if you start to feel better.  Keep the infected arm or leg elevated to reduce swelling.  Apply a warm cloth to the affected area up to 4 times per day to relieve pain.  Take medicines only as directed by your health care provider.  Keep all follow-up visits as directed by your health care provider. SEEK MEDICAL CARE IF:   You notice red streaks coming from the infected area.  Your red area gets larger or turns dark in color.  Your bone or joint underneath the infected area becomes painful after the skin has healed.  Your infection returns in the same area or another area.  You notice a swollen bump in the infected area.  You develop new symptoms.  You have a fever. SEEK IMMEDIATE MEDICAL CARE IF:   You feel very sleepy.  You develop vomiting or diarrhea.  You have a general ill feeling (malaise) with muscle aches and pains. MAKE SURE YOU:   Understand these instructions.  Will watch your condition.  Will get help right away if you are not doing well or get  worse. Document Released: 03/03/2005 Document Revised: 10/08/2013 Document Reviewed: 08/09/2011 [facility] Patient Information 2015 [place], [place]. This information is not intended to replace advice given to you by your health care provider. Make sure you discuss any questions you have with your health care provider. ment in 1 week

## 2014-01-30 ENCOUNTER — Encounter: Payer: Self-pay | Admitting: Nurse Practitioner

## 2014-02-01 ENCOUNTER — Ambulatory Visit (INDEPENDENT_AMBULATORY_CARE_PROVIDER_SITE_OTHER): Payer: 59 | Admitting: Internal Medicine

## 2014-02-01 ENCOUNTER — Other Ambulatory Visit: Payer: 59

## 2014-02-01 ENCOUNTER — Encounter: Payer: Self-pay | Admitting: Internal Medicine

## 2014-02-01 VITALS — BP 108/68 | HR 80 | Temp 97.9°F | Resp 16 | Wt 322.0 lb

## 2014-02-01 DIAGNOSIS — N61 Mastitis without abscess: Secondary | ICD-10-CM

## 2014-02-01 DIAGNOSIS — N611 Abscess of the breast and nipple: Secondary | ICD-10-CM | POA: Insufficient documentation

## 2014-02-01 NOTE — Patient Instructions (Signed)
Incision and Drainage Incision and drainage is a procedure in which a sac-like structure (cystic structure) is opened and drained. The area to be drained usually contains material such as pus, fluid, or blood.  LET YOUR CAREGIVER KNOW ABOUT:   Allergies to medicine.  Medicines taken, including vitamins, herbs, eyedrops, over-the-counter medicines, and creams.  Use of steroids (by mouth or creams).  Previous problems with anesthetics or numbing medicines.  History of bleeding problems or blood clots.  Previous surgery.  Other health problems, including diabetes and kidney problems.  Possibility of pregnancy, if this applies. RISKS AND COMPLICATIONS  Pain.  Bleeding.  Scarring.  Infection. BEFORE THE PROCEDURE  You may need to have an ultrasound or other imaging tests to see how large or deep your cystic structure is. Blood tests may also be used to determine if you have an infection or how severe the infection is. You may need to have a tetanus shot. PROCEDURE  The affected area is cleaned with a cleaning fluid. The cyst area will then be numbed with a medicine (local anesthetic). A small incision will be made in the cystic structure. A syringe or catheter may be used to drain the contents of the cystic structure, or the contents may be squeezed out. The area will then be flushed with a cleansing solution. After cleansing the area, it is often gently packed with a gauze or another wound dressing. Once it is packed, it will be covered with gauze and tape or some other type of wound dressing. AFTER THE PROCEDURE   Often, you will be allowed to go home right after the procedure.  You may be given antibiotic medicine to prevent or heal an infection.  If the area was packed with gauze or some other wound dressing, you will likely need to come back in 1 to 2 days to get it removed.  The area should heal in about 14 days. Document Released: 11/17/2000 Document Revised: 11/23/2011  Document Reviewed: 07/19/2011 [employer] Patient Information 2015 [employer]. This information is not intended to replace advice given to you by your health care provider. Make sure you discuss any questions you have with your health care provider.  

## 2014-02-01 NOTE — Assessment & Plan Note (Signed)
I and D done, culture is sent I think this is MRSA, will continue bactrim and will adjust if C and S differs She will cont percocet as needed for pain She will RTC in 3 days to remove the iodoform

## 2014-02-01 NOTE — Progress Notes (Signed)
Subjective:    Patient ID: Lorraine Holt, female    DOB: [birth year], 48 y.o.   MRN: [id number]  HPI Comments: Since she was last seen the area over left breast has erupted and is draining a thick pus, pain is very slightly better.  Wound Check She was originally treated 2 to 3 days ago (levaquin and bactrim). Previous treatment included oral antibiotics. Her temperature was unmeasured prior to arrival. The temperature was taken using an axillary reading. There has been colored discharge from the wound. The redness has not changed. The swelling has not changed. The pain has not changed. She has no difficulty moving the affected extremity or digit.      Review of Systems  Constitutional: Negative.  Negative for fever, chills, diaphoresis, appetite change and fatigue.  HENT: Negative.   Eyes: Negative.   Respiratory: Negative.  Negative for cough, choking, chest tightness, shortness of breath and stridor.   Cardiovascular: Negative.  Negative for chest pain, palpitations and leg swelling.  Gastrointestinal: Negative.  Negative for nausea, vomiting, abdominal pain, diarrhea and constipation.  Endocrine: Negative.   Genitourinary: Negative.   Musculoskeletal: Negative.   Skin: Positive for color change and wound. Negative for pallor and rash.  Allergic/Immunologic: Negative.   Neurological: Negative.   Hematological: Negative.  Negative for adenopathy. Does not bruise/bleed easily.  Psychiatric/Behavioral: Negative.        Objective:   Physical Exam  Vitals reviewed. Constitutional: She is oriented to person, place, and time. She appears well-developed and well-nourished.  Non-toxic appearance. She does not have a sickly appearance. She does not appear ill. No distress.  Eyes: Conjunctivae are normal. Right eye exhibits no discharge. Left eye exhibits no discharge. No scleral icterus.  Neck: Neck supple.  Cardiovascular: Normal rate, regular rhythm, normal heart sounds and intact  distal pulses.  Exam reveals no gallop and no friction rub.   No murmur heard. Pulmonary/Chest: Effort normal. No respiratory distress. She has no wheezes. She has no rales. She exhibits no tenderness. Right breast exhibits no inverted nipple, no mass, no nipple discharge, no skin change and no tenderness. Left breast exhibits skin change and tenderness. Left breast exhibits no inverted nipple, no mass and no nipple discharge. Breasts are symmetrical.    Abdominal: Soft. Bowel sounds are normal. She exhibits no distension and no mass. There is no tenderness. There is no rebound and no guarding.  Musculoskeletal: Normal range of motion. She exhibits no edema and no tenderness.  Neurological: She is oriented to person, place, and time.  Skin: Skin is warm, dry and intact. No abrasion, no bruising, no burn, no ecchymosis, no laceration, no lesion, no petechiae and no rash noted. She is not diaphoretic. There is erythema. No pallor.  The area over the left breast was cleaned with betadine then local anesthesia was obtained with 1.5 cc of plain 2% lidocaine. The opening was explored and a several deep loculations were identified and a bloody exudate was found, a culture was collected, the cavities were irrigated with H2O2 and Qtips, then the cavities were packed with iodoform. She tolerated this well.    Lab Results  Component Value Date   WBC 6.0 01/07/2014   HGB 13.7 01/07/2014   HCT 42.7 01/07/2014   PLT 272.0 01/07/2014   GLUCOSE 136* 01/07/2014   CHOL 162 01/07/2014   TRIG 84.0 01/07/2014   HDL 40.40 01/07/2014   LDLCALC 105* 01/07/2014   ALT 15 01/07/2014   AST 17 01/07/2014  NA 135 01/07/2014   K 4.4 01/07/2014   CL 103 01/07/2014   CREATININE 0.8 01/07/2014   BUN 15 01/07/2014   CO2 29 01/07/2014   TSH 2.83 01/07/2014   HGBA1C 7.5* 01/07/2014   MICROALBUR 1.1 08/08/2013        Assessment & Plan:

## 2014-02-04 ENCOUNTER — Ambulatory Visit (INDEPENDENT_AMBULATORY_CARE_PROVIDER_SITE_OTHER): Payer: 59 | Admitting: Internal Medicine

## 2014-02-04 ENCOUNTER — Encounter: Payer: Self-pay | Admitting: Internal Medicine

## 2014-02-04 VITALS — BP 120/80 | HR 88 | Temp 98.1°F | Resp 16 | Ht 67.0 in | Wt 320.0 lb

## 2014-02-04 DIAGNOSIS — N61 Mastitis without abscess: Secondary | ICD-10-CM

## 2014-02-04 DIAGNOSIS — N611 Abscess of the breast and nipple: Secondary | ICD-10-CM

## 2014-02-04 LAB — WOUND CULTURE
GRAM STAIN: NONE SEEN
GRAM STAIN: NONE SEEN
Organism ID, Bacteria: NO GROWTH

## 2014-02-04 NOTE — Patient Instructions (Signed)
Wound Check °Your wound appears healthy today. Your wound will heal gradually over time. Eventually a scar will form that will fade with time. °FACTORS THAT AFFECT SCAR FORMATION: °· People differ in the severity in which they scar.  °· Scar severity varies according to location, size, and the traits you inherited from your parents (genetic predisposition). °· Irritation to the wound from infection, rubbing, or chemical exposure will increase the amount of scar formation. °HOME CARE INSTRUCTIONS  °· If you were given a dressing, you should change it at least once a day or as instructed by your caregiver. If the bandage sticks, soak it off with a solution of hydrogen peroxide. °· If the bandage becomes wet, dirty, or develops a bad smell, change it as soon as possible. °· Look for signs of infection. °· Only take over-the-counter or prescription medicines for pain, discomfort, or fever as directed by your caregiver. °SEEK IMMEDIATE MEDICAL CARE IF:  °· You have redness, swelling, or increasing pain in the wound. °· You notice pus coming from the wound. °· You have a fever. °· You notice a bad smell coming from the wound or dressing. °Document Released: 02/28/2004 Document Revised: 08/16/2011 Document Reviewed: 05/24/2005 °[employer]® Patient Information ©2015 [employer]. This information is not intended to replace advice given to you by your health care provider. Make sure you discuss any questions you have with your health care provider. ° °

## 2014-02-04 NOTE — Progress Notes (Signed)
Pre visit review using our clinic review tool, if applicable. No additional management support is needed unless otherwise documented below in the visit note. 

## 2014-02-05 NOTE — Progress Notes (Signed)
   Subjective:    Patient ID: Lorraine Holt, female    DOB: 07-05-1965, 48 y.o.   MRN: [id number]  Wound Check She was originally treated 2 to 3 days ago. Previous treatment included I&D of abscess, oral antibiotics and wound cleansing or irrigation. Her temperature was unmeasured prior to arrival. There has been no drainage from the wound. The redness has improved. The swelling has improved. The pain has improved. She has no difficulty moving the affected extremity or digit.      Review of Systems  Constitutional: Negative.  Negative for fever, chills, diaphoresis, appetite change and fatigue.  HENT: Negative.   Eyes: Negative.   Respiratory: Negative.  Negative for cough, choking, chest tightness, shortness of breath and stridor.   Cardiovascular: Negative.  Negative for chest pain, palpitations and leg swelling.  Gastrointestinal: Negative.  Negative for nausea, vomiting, abdominal pain, diarrhea, constipation and blood in stool.  Endocrine: Negative.   Genitourinary: Negative.   Musculoskeletal: Negative.   Allergic/Immunologic: Negative.   Neurological: Negative.   Hematological: Negative.  Negative for adenopathy. Does not bruise/bleed easily.  Psychiatric/Behavioral: Negative.        Objective:   Physical Exam  Constitutional:  Non-toxic appearance. She does not have a sickly appearance. She does not appear ill. No distress.  Pulmonary/Chest:            Assessment & Plan:

## 2014-02-05 NOTE — Assessment & Plan Note (Signed)
Improvement noted Packing removed The clx is negative though I still think this is MRSA, will cont bactrim

## 2014-02-13 ENCOUNTER — Encounter: Payer: Self-pay | Admitting: Internal Medicine

## 2014-02-25 ENCOUNTER — Ambulatory Visit (INDEPENDENT_AMBULATORY_CARE_PROVIDER_SITE_OTHER): Payer: 59 | Admitting: Internal Medicine

## 2014-02-25 ENCOUNTER — Encounter: Payer: Self-pay | Admitting: Internal Medicine

## 2014-02-25 VITALS — BP 128/78 | HR 85 | Temp 98.5°F | Resp 16 | Ht 67.0 in | Wt 319.0 lb

## 2014-02-25 DIAGNOSIS — N61 Mastitis without abscess: Secondary | ICD-10-CM

## 2014-02-25 DIAGNOSIS — N611 Abscess of the breast and nipple: Secondary | ICD-10-CM

## 2014-02-25 LAB — HM DIABETES FOOT EXAM

## 2014-02-25 NOTE — Patient Instructions (Signed)

## 2014-02-25 NOTE — Progress Notes (Signed)
Pre visit review using our clinic review tool, if applicable. No additional management support is needed unless otherwise documented below in the visit note. 

## 2014-02-25 NOTE — Progress Notes (Signed)
   Subjective:    Patient ID: Lorraine Holt, female    DOB: Nov 04, 1965, 48 y.o.   MRN: [id number]  Wound Check She was originally treated more than 14 days ago. Previous treatment included oral antibiotics and I&D of abscess. Her temperature was unmeasured prior to arrival. The temperature was taken using an axillary reading. There has been no drainage from the wound. There is no redness present. There is no swelling present. The pain has no pain. She has no difficulty moving the affected extremity or digit.      Review of Systems  All other systems reviewed and are negative.      Objective:   Physical Exam  Vitals reviewed. Constitutional: She is oriented to person, place, and time. She appears well-developed and well-nourished. No distress.  HENT:  Head: Normocephalic and atraumatic.  Mouth/Throat: Oropharynx is clear and moist. No oropharyngeal exudate.  Eyes: Conjunctivae are normal. Right eye exhibits no discharge. Left eye exhibits no discharge. No scleral icterus.  Neck: Normal range of motion. Neck supple. No JVD present. No tracheal deviation present. No thyromegaly present.  Cardiovascular: Normal rate, regular rhythm, normal heart sounds and intact distal pulses.  Exam reveals no gallop and no friction rub.   No murmur heard. Pulmonary/Chest: Effort normal and breath sounds normal. No stridor. No respiratory distress. She has no wheezes. She has no rales. She exhibits no tenderness.    Abdominal: Soft. Bowel sounds are normal. She exhibits no distension and no mass. There is no tenderness. There is no rebound and no guarding.  Musculoskeletal: Normal range of motion. She exhibits no edema and no tenderness.  Lymphadenopathy:    She has no cervical adenopathy.  Neurological: She is oriented to person, place, and time.  Skin: Skin is warm and dry. No rash noted. She is not diaphoretic. No erythema. No pallor.     Lab Results  Component Value Date   WBC 6.0 01/07/2014   HGB 13.7 01/07/2014   HCT 42.7 01/07/2014   PLT 272.0 01/07/2014   GLUCOSE 136* 01/07/2014   CHOL 162 01/07/2014   TRIG 84.0 01/07/2014   HDL 40.40 01/07/2014   LDLCALC 105* 01/07/2014   ALT 15 01/07/2014   AST 17 01/07/2014   NA 135 01/07/2014   K 4.4 01/07/2014   CL 103 01/07/2014   CREATININE 0.8 01/07/2014   BUN 15 01/07/2014   CO2 29 01/07/2014   TSH 2.83 01/07/2014   HGBA1C 7.5* 01/07/2014   MICROALBUR 1.1 08/08/2013       Assessment & Plan:

## 2014-02-25 NOTE — Assessment & Plan Note (Signed)
This has resolved Will follow for now

## 2014-03-07 HISTORY — PX: BREAST BIOPSY: SHX20

## 2014-03-20 ENCOUNTER — Other Ambulatory Visit: Payer: Self-pay | Admitting: Radiology

## 2014-04-01 ENCOUNTER — Encounter: Payer: Self-pay | Admitting: Internal Medicine

## 2014-04-07 HISTORY — PX: COLONOSCOPY: SHX174

## 2014-04-27 LAB — HM COLONOSCOPY: HM Colonoscopy: NORMAL

## 2014-05-20 ENCOUNTER — Encounter: Payer: Self-pay | Admitting: Cardiology

## 2014-06-26 ENCOUNTER — Other Ambulatory Visit (INDEPENDENT_AMBULATORY_CARE_PROVIDER_SITE_OTHER): Payer: 59

## 2014-06-26 ENCOUNTER — Encounter: Payer: Self-pay | Admitting: Internal Medicine

## 2014-06-26 ENCOUNTER — Ambulatory Visit (INDEPENDENT_AMBULATORY_CARE_PROVIDER_SITE_OTHER): Payer: 59 | Admitting: Internal Medicine

## 2014-06-26 VITALS — BP 124/82 | HR 100 | Temp 98.7°F | Resp 16 | Ht 67.0 in | Wt 311.0 lb

## 2014-06-26 DIAGNOSIS — I10 Essential (primary) hypertension: Secondary | ICD-10-CM

## 2014-06-26 DIAGNOSIS — G5601 Carpal tunnel syndrome, right upper limb: Secondary | ICD-10-CM | POA: Insufficient documentation

## 2014-06-26 DIAGNOSIS — E118 Type 2 diabetes mellitus with unspecified complications: Secondary | ICD-10-CM

## 2014-06-26 DIAGNOSIS — R202 Paresthesia of skin: Secondary | ICD-10-CM

## 2014-06-26 DIAGNOSIS — E785 Hyperlipidemia, unspecified: Secondary | ICD-10-CM

## 2014-06-26 LAB — URINALYSIS, ROUTINE W REFLEX MICROSCOPIC
Bilirubin Urine: NEGATIVE
Ketones, ur: NEGATIVE
Leukocytes, UA: NEGATIVE
NITRITE: NEGATIVE
PH: 5.5 (ref 5.0–8.0)
TOTAL PROTEIN, URINE-UPE24: NEGATIVE
Urine Glucose: NEGATIVE
Urobilinogen, UA: 0.2 (ref 0.0–1.0)

## 2014-06-26 LAB — CBC WITH DIFFERENTIAL/PLATELET
BASOS PCT: 0.4 % (ref 0.0–3.0)
Basophils Absolute: 0 10*3/uL (ref 0.0–0.1)
EOS PCT: 4.3 % (ref 0.0–5.0)
Eosinophils Absolute: 0.3 10*3/uL (ref 0.0–0.7)
HCT: 41 % (ref 36.0–46.0)
HEMOGLOBIN: 13.6 g/dL (ref 12.0–15.0)
LYMPHS PCT: 30 % (ref 12.0–46.0)
Lymphs Abs: 1.9 10*3/uL (ref 0.7–4.0)
MCHC: 33.3 g/dL (ref 30.0–36.0)
MCV: 84.2 fl (ref 78.0–100.0)
Monocytes Absolute: 0.5 10*3/uL (ref 0.1–1.0)
Monocytes Relative: 8.7 % (ref 3.0–12.0)
NEUTROS ABS: 3.5 10*3/uL (ref 1.4–7.7)
NEUTROS PCT: 56.6 % (ref 43.0–77.0)
Platelets: 262 10*3/uL (ref 150.0–400.0)
RBC: 4.87 Mil/uL (ref 3.87–5.11)
RDW: 15.5 % (ref 11.5–15.5)
WBC: 6.3 10*3/uL (ref 4.0–10.5)

## 2014-06-26 LAB — COMPREHENSIVE METABOLIC PANEL
ALBUMIN: 3.5 g/dL (ref 3.5–5.2)
ALK PHOS: 111 U/L (ref 39–117)
ALT: 16 U/L (ref 0–35)
AST: 13 U/L (ref 0–37)
BILIRUBIN TOTAL: 0.7 mg/dL (ref 0.2–1.2)
BUN: 17 mg/dL (ref 6–23)
CO2: 26 mEq/L (ref 19–32)
Calcium: 9.2 mg/dL (ref 8.4–10.5)
Chloride: 106 mEq/L (ref 96–112)
Creatinine, Ser: 0.73 mg/dL (ref 0.40–1.20)
GFR: 108.11 mL/min (ref >60.00)
GLUCOSE: 112 mg/dL — AB (ref 70–99)
Potassium: 4 mEq/L (ref 3.5–5.1)
Sodium: 137 mEq/L (ref 135–145)
Total Protein: 7.4 g/dL (ref 6.0–8.3)

## 2014-06-26 LAB — HEMOGLOBIN A1C: HEMOGLOBIN A1C: 7 % — AB (ref 4.6–6.5)

## 2014-06-26 LAB — MICROALBUMIN / CREATININE URINE RATIO
Creatinine,U: 177.6 mg/dL
MICROALB UR: 1.5 mg/dL (ref 0.0–1.9)
Microalb Creat Ratio: 0.8 mg/g (ref 0.0–30.0)

## 2014-06-26 LAB — VITAMIN B12: Vitamin B-12: 465 pg/mL (ref 211–911)

## 2014-06-26 LAB — TSH: TSH: 4.21 u[IU]/mL (ref 0.35–4.50)

## 2014-06-26 LAB — CK: Total CK: 108 U/L (ref 7–177)

## 2014-06-26 NOTE — Progress Notes (Signed)
Pre visit review using our clinic review tool, if applicable. No additional management support is needed unless otherwise documented below in the visit note. 

## 2014-06-26 NOTE — Patient Instructions (Signed)

## 2014-06-26 NOTE — Progress Notes (Signed)
Subjective:    Patient ID: Lorraine Holt, female    DOB: 06/12/1965, 49 y.o.   MRN: [id number]  Diabetes She presents for her follow-up diabetic visit. She has type 2 diabetes mellitus. Her disease course has been stable. Pertinent negatives for hypoglycemia include no dizziness, headaches, speech difficulty or tremors. Pertinent negatives for diabetes include no blurred vision, no chest pain, no fatigue, no foot paresthesias, no foot ulcerations, no polydipsia, no polyphagia, no polyuria, no visual change, no weakness and no weight loss. There are no hypoglycemic complications. Symptoms are stable. There are no diabetic complications. Current diabetic treatment includes oral agent (monotherapy). She is compliant with treatment all of the time. Her weight is stable. She is following a generally healthy diet. Meal planning includes avoidance of concentrated sweets. She participates in exercise intermittently. There is no change in her home blood glucose trend. An ACE inhibitor/angiotensin II receptor blocker is being taken. She does not see a podiatrist.Eye exam is current.      Review of Systems  Constitutional: Negative.  Negative for fever, chills, weight loss, diaphoresis, appetite change and fatigue.  HENT: Negative.   Eyes: Negative.  Negative for blurred vision.  Respiratory: Negative.  Negative for cough, choking, chest tightness, shortness of breath and stridor.   Cardiovascular: Negative.  Negative for chest pain, palpitations and leg swelling.  Gastrointestinal: Positive for constipation. Negative for nausea, vomiting, abdominal pain, diarrhea and blood in stool.  Endocrine: Negative.  Negative for polydipsia, polyphagia and polyuria.  Genitourinary: Negative.   Musculoskeletal: Positive for myalgias and back pain. Negative for joint swelling, arthralgias, gait problem, neck pain and neck stiffness.  Skin: Negative.  Negative for rash.  Allergic/Immunologic: Negative.   Neurological:  Positive for numbness. Negative for dizziness, tremors, speech difficulty, weakness, light-headedness and headaches.       For several months she has had intermittent episodes of numbness, tingling, and burning pain in her LLE and RUE.  Hematological: Negative.  Negative for adenopathy. Does not bruise/bleed easily.  Psychiatric/Behavioral: Negative.        Objective:   Physical Exam  Constitutional: She is oriented to person, place, and time. She appears well-developed and well-nourished. No distress.  HENT:  Head: Normocephalic and atraumatic.  Mouth/Throat: Oropharynx is clear and moist. No oropharyngeal exudate.  Eyes: Conjunctivae are normal. Right eye exhibits no discharge. Left eye exhibits no discharge. No scleral icterus.  Neck: Normal range of motion. Neck supple. No JVD present. No tracheal deviation present. No thyromegaly present.  Cardiovascular: Normal rate, regular rhythm, normal heart sounds and intact distal pulses.  Exam reveals no gallop and no friction rub.   No murmur heard. Pulmonary/Chest: Effort normal and breath sounds normal. No stridor. No respiratory distress. She has no wheezes. She has no rales. She exhibits no tenderness.  Abdominal: Soft. Bowel sounds are normal. She exhibits no distension and no mass. There is no tenderness. There is no rebound and no guarding.  Musculoskeletal: Normal range of motion. She exhibits no edema or tenderness.  Lymphadenopathy:    She has no cervical adenopathy.  Neurological: She is alert and oriented to person, place, and time. She has normal strength. She displays no atrophy, no tremor and normal reflexes. No cranial nerve deficit or sensory deficit. She exhibits normal muscle tone. She displays a negative Romberg sign. She displays no seizure activity. Coordination and gait normal. She displays no Babinski's sign on the right side. She displays no Babinski's sign on the left side.  Reflex  Scores:      Tricep reflexes are 0 on  the right side and 0 on the left side.      Bicep reflexes are 0 on the right side and 0 on the left side.      Brachioradialis reflexes are 0 on the right side and 0 on the left side.      Patellar reflexes are 0 on the right side and 0 on the left side.      Achilles reflexes are 0 on the right side and 0 on the left side. Skin: Skin is warm and dry. No rash noted. She is not diaphoretic. No erythema. No pallor.  Psychiatric: She has a normal mood and affect. Her behavior is normal. Judgment and thought content normal.  Vitals reviewed.     Lab Results  Component Value Date   WBC 6.0 01/07/2014   HGB 13.7 01/07/2014   HCT 42.7 01/07/2014   PLT 272.0 01/07/2014   GLUCOSE 136* 01/07/2014   CHOL 162 01/07/2014   TRIG 84.0 01/07/2014   HDL 40.40 01/07/2014   LDLCALC 105* 01/07/2014   ALT 15 01/07/2014   AST 17 01/07/2014   NA 135 01/07/2014   K 4.4 01/07/2014   CL 103 01/07/2014   CREATININE 0.8 01/07/2014   BUN 15 01/07/2014   CO2 29 01/07/2014   TSH 2.83 01/07/2014   HGBA1C 7.5* 01/07/2014   MICROALBUR 1.1 08/08/2013      Assessment & Plan:

## 2014-06-27 ENCOUNTER — Encounter: Payer: Self-pay | Admitting: Internal Medicine

## 2014-06-27 NOTE — Assessment & Plan Note (Signed)
She has mild myalgias but her CK is WNL Will work up her neuro s/s and will re-evaluate later

## 2014-06-27 NOTE — Assessment & Plan Note (Signed)
Her blood sugars are adequately well controlled Her renal function is stable I have asked her to improve her lifestyle modifications

## 2014-06-27 NOTE — Assessment & Plan Note (Signed)
She has a diffuse decrease in DTR's but the decrease is symmetrical and is most likely related to her body habitus I will check her labs to see if there is a metabolic cause for this I am concerned that she may have diabetic neuropathy - I have asked her to have a NCS ad EMG done to clarify the cause for her numbness and tingling

## 2014-06-27 NOTE — Assessment & Plan Note (Signed)
Her BP is well controlled Lytes and renal function are stable 

## 2014-07-02 ENCOUNTER — Other Ambulatory Visit: Payer: Self-pay | Admitting: *Deleted

## 2014-07-02 DIAGNOSIS — R202 Paresthesia of skin: Secondary | ICD-10-CM

## 2014-07-15 ENCOUNTER — Encounter: Payer: Self-pay | Admitting: Obstetrics & Gynecology

## 2014-07-15 ENCOUNTER — Other Ambulatory Visit (HOSPITAL_COMMUNITY)
Admission: RE | Admit: 2014-07-15 | Discharge: 2014-07-15 | Disposition: A | Discharge status: Home / Self Care | Payer: 59 | Source: Ambulatory Visit | Attending: Obstetrics & Gynecology | Admitting: Obstetrics & Gynecology

## 2014-07-15 ENCOUNTER — Ambulatory Visit (INDEPENDENT_AMBULATORY_CARE_PROVIDER_SITE_OTHER): Payer: 59 | Admitting: Obstetrics & Gynecology

## 2014-07-15 VITALS — BP 147/89 | HR 85 | Temp 97.9°F | Wt 315.8 lb

## 2014-07-15 DIAGNOSIS — N939 Abnormal uterine and vaginal bleeding, unspecified: Secondary | ICD-10-CM | POA: Diagnosis not present

## 2014-07-15 DIAGNOSIS — D219 Benign neoplasm of connective and other soft tissue, unspecified: Secondary | ICD-10-CM | POA: Insufficient documentation

## 2014-07-15 DIAGNOSIS — D259 Leiomyoma of uterus, unspecified: Secondary | ICD-10-CM

## 2014-07-15 MED ORDER — MEGESTROL ACETATE 40 MG PO TABS: 40.0000 mg | ORAL_TABLET | Freq: Two times a day (BID) | ORAL | Status: DC

## 2014-07-15 NOTE — Patient Instructions (Signed)
Endometrial Ablation Endometrial ablation removes the lining of the uterus (endometrium). It is usually a same-day, outpatient treatment. Ablation helps avoid major surgery, such as surgery to remove the cervix and uterus (hysterectomy). After endometrial ablation, you will have little or no menstrual bleeding and may not be able to have children. However, if you are premenopausal, you will need to use a reliable method of birth control following the procedure because of the small chance that pregnancy can occur. There are different reasons to have this procedure, which include:  Heavy periods.  Bleeding that is causing anemia.  Irregular bleeding.  Bleeding fibroids on the lining inside the uterus if they are smaller than 3 centimeters. This procedure should not be done if:  You want children in the future.  You have severe cramps with your menstrual period.  You have precancerous or cancerous cells in your uterus.  You were recently pregnant.  You have gone through menopause.  You have had major surgery on the uterus, such as a cesarean delivery. LET [facility] CARE PROVIDER KNOW ABOUT:  Any allergies you have.  All medicines you are taking, including vitamins, herbs, eye drops, creams, and over-the-counter medicines.  Previous problems you or members of your family have had with the use of anesthetics.  Any blood disorders you have.  Previous surgeries you have had.  Medical conditions you have. RISKS AND COMPLICATIONS  Generally, this is a safe procedure. However, as with any procedure, complications can occur. Possible complications include:  Perforation of the uterus.  Bleeding.  Infection of the uterus, bladder, or vagina.  Injury to surrounding organs.  An air bubble to the lung (air embolus).  Pregnancy following the procedure.  Failure of the procedure to help the problem, requiring hysterectomy.  Decreased ability to diagnose cancer in the lining of  the uterus. BEFORE THE PROCEDURE  The lining of the uterus must be tested to make sure there is no pre-cancerous or cancer cells present.  An ultrasound may be performed to look at the size of the uterus and to check for abnormalities.  Medicines may be given to thin the lining of the uterus. PROCEDURE  During the procedure, your health care provider will use a tool called a resectoscope to help see inside your uterus. There are different ways to remove the lining of your uterus.   Radiofrequency - This method uses a radiofrequency-alternating electric current to remove the lining of the uterus.  Cryotherapy - This method uses extreme cold to freeze the lining of the uterus.  Heated-Free Liquid - This method uses heated salt (saline) solution to remove the lining of the uterus.  Microwave - This method uses high-energy microwaves to heat up the lining of the uterus to remove it.  Thermal balloon - This method involves inserting a catheter with a balloon tip into the uterus. The balloon tip is filled with heated fluid to remove the lining of the uterus. AFTER THE PROCEDURE  After your procedure, do not have sexual intercourse or insert anything into your vagina until permitted by your health care provider. After the procedure, you may experience:  Cramps.  Vaginal discharge.  Frequent urination. Document Released: 04/02/2004 Document Revised: 01/24/2013 Document Reviewed: 10/25/2012 [facility] Patient Information 2015 [place], [place]. This information is not intended to replace advice given to you by your health care provider. Make sure you discuss any questions you have with your health care provider.   Hysterectomy Information  A hysterectomy is a surgery in which your  uterus is removed. This surgery may be done to treat various medical problems. After the surgery, you will no longer have menstrual periods. The surgery will also make you unable to become pregnant (sterile). The  fallopian tubes and ovaries can be removed (bilateral salpingo-oophorectomy) during this surgery as well.  REASONS FOR A HYSTERECTOMY  Persistent, abnormal bleeding.  Lasting (chronic) pelvic pain or infection.  The lining of the uterus (endometrium) starts growing outside the uterus (endometriosis).  The endometrium starts growing in the muscle of the uterus (adenomyosis).  The uterus falls down into the vagina (pelvic organ prolapse).  Noncancerous growths in the uterus (uterine fibroids) that cause symptoms.  Precancerous cells.  Cervical cancer or uterine cancer. TYPES OF HYSTERECTOMIES  Supracervical hysterectomy--In this type, the top part of the uterus is removed, but not the cervix.  Total hysterectomy--The uterus and cervix are removed.  Radical hysterectomy--The uterus, the cervix, and the fibrous tissue that holds the uterus in place in the pelvis (parametrium) are removed. WAYS A HYSTERECTOMY CAN BE PERFORMED  Abdominal hysterectomy--A large surgical cut (incision) is made in the abdomen. The uterus is removed through this incision.  Vaginal hysterectomy--An incision is made in the vagina. The uterus is removed through this incision. There are no abdominal incisions.  Conventional laparoscopic hysterectomy--Three or four small incisions are made in the abdomen. A thin, lighted tube with a camera (laparoscope) is inserted into one of the incisions. Other tools are put through the other incisions. The uterus is cut into small pieces. The small pieces are removed through the incisions, or they are removed through the vagina.  Laparoscopically assisted vaginal hysterectomy (LAVH)--Three or four small incisions are made in the abdomen. Part of the surgery is performed laparoscopically and part vaginally. The uterus is removed through the vagina.  Robot-assisted laparoscopic hysterectomy--A laparoscope and other tools are inserted into 3 or 4 small incisions in the abdomen. A  computer-controlled device is used to give the surgeon a 3D image and to help control the surgical instruments. This allows for more precise movements of surgical instruments. The uterus is cut into small pieces and removed through the incisions or removed through the vagina. RISKS AND COMPLICATIONS  Possible complications associated with this procedure include:  Bleeding and risk of blood transfusion. Tell your health care provider if you do not want to receive any blood products.  Blood clots in the legs or lung.  Infection.  Injury to surrounding organs.  Problems or side effects related to anesthesia.  Conversion to an abdominal hysterectomy from one of the other techniques. WHAT TO EXPECT AFTER A HYSTERECTOMY  You will be given pain medicine.  You will need to have someone with you for the first 3-5 days after you go home.  You will need to follow up with your surgeon in 2-4 weeks after surgery to evaluate your progress.  You may have early menopause symptoms such as hot flashes, night sweats, and insomnia.  If you had a hysterectomy for a problem that was not cancer or not a condition that could lead to cancer, then you no longer need Pap tests. However, even if you no longer need a Pap test, a regular exam is a good idea to make sure no other problems are starting. Document Released: 11/17/2000 Document Revised: 03/14/2013 Document Reviewed: 01/29/2013 [facility] Patient Information 2015 [place], [place]. This information is not intended to replace advice given to you by your health care provider. Make sure you discuss any questions you have with  your health care provider.

## 2014-07-15 NOTE — Progress Notes (Signed)
Patient ID: Lorraine Holt, female   DOB: 05-Jul-1965, 49 y.o.   MRN: [id number] U/s scheduled for 07/19/14 @ 730

## 2014-07-15 NOTE — Progress Notes (Signed)
    CLINIC ENCOUNTER NOTE  History:  49 y.o. [id number] here today for evaluation of AUB and fibroids for over one year.     The following portions of the patient's history were reviewed and updated as appropriate: allergies, current medications, past family history, past medical history, past social history, past surgical history and problem list. Normal pap and negative HRHPV on 04/2013.  Abnormal mammogram in 01/19/14 with left breast calcifications, biopsy negative.   Review of Systems:  Pertinent items are noted in HPI.  Objective:  Physical Exam BP 147/89 mmHg  Pulse 85  Temp(Src) 97.9 F (36.6 C) (Oral)  Wt 315 lb 12.8 oz (143.246 kg)  LMP 06/26/2014 Gen: NAD Abd: Soft,obese, nontender and nondistended Pelvic: Normal appearing external genitalia; normal appearing vaginal mucosa and cervix.  Normal discharge.  Unable to palpate uterus or adnexa secondary to habitus.  ENDOMETRIAL BIOPSY     The indications for endometrial biopsy were reviewed.   Risks of the biopsy including cramping, bleeding, infection, uterine perforation, inadequate specimen and need for additional procedures  were discussed. The patient states she understands and agrees to undergo procedure today. Consent was signed. Time out was performed.   During the pelvic exam, the cervix was prepped with Betadine. A single-toothed tenaculum was placed on the anterior lip of the cervix to stabilize it. The 3 mm pipelle was introduced into the endometrial cavity without difficulty to a depth of 7 cm, felt that there was an obstructing lesion in the way.  A scant amount of tissue was obtained after two passes and sent to pathology. The instruments were removed from the patient's vagina. Minimal bleeding from the cervix was noted. The patient tolerated the procedure well. Routine post-procedure instructions were given to the patient.    Assessment & Plan:  Endometrial biopsy done, will follow up results and manage  accordingly. Pelvic ultrasound ordered Megace ordered for now, bleeding precautions reviewed. Routine preventative health maintenance measures emphasized.   Verita Schneiders, MD, [place] Attending [place] for [employer], [place]

## 2014-07-18 ENCOUNTER — Telehealth: Payer: Self-pay | Admitting: *Deleted

## 2014-07-18 NOTE — Telephone Encounter (Signed)
Contacted patient and informed of results of Endometrial Biopsy.  Pt verbalizes understanding and has no further questions.

## 2014-07-18 NOTE — Telephone Encounter (Signed)
-----   Message from Osborne Oman, MD sent at 07/17/2014  2:10 PM EST ----- Benign endometrial biopsy. Please call to inform patient of results.

## 2014-07-19 ENCOUNTER — Ambulatory Visit (HOSPITAL_COMMUNITY)
Admission: RE | Admit: 2014-07-19 | Discharge: 2014-07-19 | Disposition: A | Discharge status: Home / Self Care | Payer: 59 | Source: Ambulatory Visit | Attending: Obstetrics & Gynecology | Admitting: Obstetrics & Gynecology

## 2014-07-19 DIAGNOSIS — N939 Abnormal uterine and vaginal bleeding, unspecified: Secondary | ICD-10-CM | POA: Diagnosis present

## 2014-07-19 DIAGNOSIS — D251 Intramural leiomyoma of uterus: Secondary | ICD-10-CM | POA: Insufficient documentation

## 2014-07-23 ENCOUNTER — Telehealth: Payer: Self-pay

## 2014-07-23 ENCOUNTER — Encounter: Payer: Self-pay | Admitting: Obstetrics & Gynecology

## 2014-07-23 NOTE — Telephone Encounter (Signed)
-----   Message from Osborne Oman, MD sent at 07/23/2014  9:26 AM EST ----- One big 9 cm fibroid noted.  Please call to inform patient of results.  Needs follow up appointment to discuss long term management options.

## 2014-07-23 NOTE — Telephone Encounter (Signed)
Attempted to contact patient. No answer. Left message stating we are calling with results and to ensure you are aware of your scheduled appointment 2/18 at 1:15. Please call clinic. Appointment had already been scheduled for results and to discuss management-- patient should be aware.

## 2014-07-25 ENCOUNTER — Encounter: Payer: Self-pay | Admitting: Obstetrics & Gynecology

## 2014-07-25 ENCOUNTER — Ambulatory Visit (INDEPENDENT_AMBULATORY_CARE_PROVIDER_SITE_OTHER): Payer: 59 | Admitting: Obstetrics & Gynecology

## 2014-07-25 VITALS — BP 130/87 | HR 102 | Temp 98.5°F | Ht 67.0 in | Wt 315.0 lb

## 2014-07-25 DIAGNOSIS — Z01818 Encounter for other preprocedural examination: Secondary | ICD-10-CM

## 2014-07-25 DIAGNOSIS — D251 Intramural leiomyoma of uterus: Secondary | ICD-10-CM

## 2014-07-25 DIAGNOSIS — N939 Abnormal uterine and vaginal bleeding, unspecified: Secondary | ICD-10-CM

## 2014-07-25 NOTE — Patient Instructions (Signed)
Hysterectomy Information  A hysterectomy is a surgery in which your uterus is removed. This surgery may be done to treat various medical problems. After the surgery, you will no longer have menstrual periods. The surgery will also make you unable to become pregnant (sterile). The fallopian tubes and ovaries can be removed (bilateral salpingo-oophorectomy) during this surgery as well.  REASONS FOR A HYSTERECTOMY  Persistent, abnormal bleeding.  Lasting (chronic) pelvic pain or infection.  The lining of the uterus (endometrium) starts growing outside the uterus (endometriosis).  The endometrium starts growing in the muscle of the uterus (adenomyosis).  The uterus falls down into the vagina (pelvic organ prolapse).  Noncancerous growths in the uterus (uterine fibroids) that cause symptoms.  Precancerous cells.  Cervical cancer or uterine cancer. TYPES OF HYSTERECTOMIES  Supracervical hysterectomy--In this type, the top part of the uterus is removed, but not the cervix.  Total hysterectomy--The uterus and cervix are removed.  Radical hysterectomy--The uterus, the cervix, and the fibrous tissue that holds the uterus in place in the pelvis (parametrium) are removed. WAYS A HYSTERECTOMY CAN BE PERFORMED  Abdominal hysterectomy--A large surgical cut (incision) is made in the abdomen. The uterus is removed through this incision.  Vaginal hysterectomy--An incision is made in the vagina. The uterus is removed through this incision. There are no abdominal incisions.  Conventional laparoscopic hysterectomy--Three or four small incisions are made in the abdomen. A thin, lighted tube with a camera (laparoscope) is inserted into one of the incisions. Other tools are put through the other incisions. The uterus is cut into small pieces. The small pieces are removed through the incisions, or they are removed through the vagina.  Laparoscopically assisted vaginal hysterectomy (LAVH)--Three or four  small incisions are made in the abdomen. Part of the surgery is performed laparoscopically and part vaginally. The uterus is removed through the vagina.  Robot-assisted laparoscopic hysterectomy--A laparoscope and other tools are inserted into 3 or 4 small incisions in the abdomen. A computer-controlled device is used to give the surgeon a 3D image and to help control the surgical instruments. This allows for more precise movements of surgical instruments. The uterus is cut into small pieces and removed through the incisions or removed through the vagina. RISKS AND COMPLICATIONS  Possible complications associated with this procedure include:  Bleeding and risk of blood transfusion. Tell your health care provider if you do not want to receive any blood products.  Blood clots in the legs or lung.  Infection.  Injury to surrounding organs.  Problems or side effects related to anesthesia.  Conversion to an abdominal hysterectomy from one of the other techniques. WHAT TO EXPECT AFTER A HYSTERECTOMY  You will be given pain medicine.  You will need to have someone with you for the first 3-5 days after you go home.  You will need to follow up with your surgeon in 2-4 weeks after surgery to evaluate your progress.  You may have early menopause symptoms such as hot flashes, night sweats, and insomnia.  If you had a hysterectomy for a problem that was not cancer or not a condition that could lead to cancer, then you no longer need Pap tests. However, even if you no longer need a Pap test, a regular exam is a good idea to make sure no other problems are starting. Document Released: 11/17/2000 Document Revised: 03/14/2013 Document Reviewed: 01/29/2013 [facility] Patient Information 2015 [place], [place]. This information is not intended to replace advice given to you by your health care  provider. Make sure you discuss any questions you have with your health care provider.

## 2014-07-25 NOTE — Progress Notes (Signed)
CLINIC ENCOUNTER NOTE  History:  49 y.o. [id number] here today for discussion of results after evaluation for AUB. She has not had a period yet; was prescribed Megace.  Benign endometrial biopsy on 07/15/14.  Had ultrasound on 07/19/14.  The following portions of the patient's history were reviewed and updated as appropriate: allergies, current medications, past family history, past medical history, past social history, past surgical history and problem list. Normal pap and negative HRHPV on 04/2013. Abnormal mammogram in 01/19/14 with left breast calcifications, biopsy negative.    Review of Systems:  Pertinent items are noted in HPI.  Objective:  Physical Exam BP 130/87 mmHg  Pulse 102  Temp(Src) 98.5 F (36.9 C)  Ht 5\' 7"  (1.702 m)  Wt 315 lb (142.883 kg)  BMI 49.32 kg/m2  LMP 06/26/2014 Gen: NAD Abd: Soft, obese, nontender and nondistended Pelvic: Deferred  Labs and Imaging 04/20/2013 Endometrium, biopsy - SCANTY SUPERFICIAL FRAGMENTS OF SECRETORY ENDOMETRIAL GLANDS. - BENIGN ENDOCERVICAL MUCOSA. - PLEASE SEE COMMENT FOR DETAILS.  07/19/2014   TRANSABDOMINAL AND TRANSVAGINAL ULTRASOUND OF PELVIS CLINICAL DATA:  Abnormal uterine bleeding, known uterine fibroids   TECHNIQUE: Both transabdominal and transvaginal ultrasound examinations of the pelvis were performed. Transabdominal technique was performed for global imaging of the pelvis including uterus, ovaries, adnexal regions, and pelvic cul-de-sac. It was necessary to proceed with endovaginal exam following the transabdominal exam to visualize the bilateral adnexa.  COMPARISON:  None  FINDINGS: Uterus  Measurements: 13.5 x 9.3 x 10.1 cm. Dominant transmural anterior right fundal fibroid measures 8.8 x 8.3 x 8.5 cm  Endometrium  Not visualized/displaced due to dominant fundal fibroid.  Right ovary  Not discretely visualized.  Left ovary  Not discretely visualized.  Other findings  No free fluid.  IMPRESSION: Enlarged uterus with  dominant right fundal fibroid measuring up to 8.8 cm.   Electronically Signed   By: Julian Hy M.D.   On: 07/19/2014 09:21    Assessment & Plan:  Ultrasound results discussed with patient.  She desires definitive management with hysterectomy.  I proposed doing a robot-assisted laparoscopic total hysterectomy (RATH) and prophylactic bilateral salpingectomy. Patient does not desire oophorectomy; does not want to undergo surgical menopause and possibly need hormonal therapy for symptoms. The surgery will be performed by Dr. Ihor Dow as the primary surgeon; she will have a separate appointment with her prior to surgery.  The risks of surgery were discussed in detail with the patient including but not limited to: bleeding which may require transfusion or reoperation; infection which may require antibiotics; injury to bowel, bladder, ureters or other surrounding organs; need for additional procedures including laparotomy; thromboembolic phenomenon, incisional problems and other postoperative/anesthesia complications.  Patient was also advised that she will likely go home on the same day, expected recovery time after a robotic hysterectomy is 2-3 weeks.  Likelihood of success in alleviating the patient's symptoms was discussed.   She was told that she will be contacted by our surgical scheduler regarding the time and date of her surgery; routine preoperative instructions of having nothing to eat or drink after midnight on the day prior to surgery and also coming to the hospital 1 1/2 hours prior to her time of surgery were also emphasized.  She was told she may be called for a preoperative appointment about a week prior to surgery and will be given further preoperative instructions at that visit.  Routine postoperative instructions will be reviewed with the patient and her family in detail after surgery.  In  the meantime, she will continue Megace; bleeding precautions were reviewed. Printed patient  education handouts about the procedure was given to the patient to review at home.  Patient will follow up with her PCP to get preoperative clearance given her DM and HTN.    Verita Schneiders, MD, [place] Attending [place] for [employer], [place]

## 2014-08-08 ENCOUNTER — Ambulatory Visit (INDEPENDENT_AMBULATORY_CARE_PROVIDER_SITE_OTHER): Payer: 59 | Admitting: Neurology

## 2014-08-08 DIAGNOSIS — R202 Paresthesia of skin: Secondary | ICD-10-CM

## 2014-08-08 DIAGNOSIS — G5601 Carpal tunnel syndrome, right upper limb: Secondary | ICD-10-CM

## 2014-08-08 NOTE — Procedures (Signed)
[facility]  [place], [place]  [place], [place] [address] Tel: [phone] Fax:  [phone] Test Date:  08/08/2014  Patient: Lorraine Holt DOB: 1965/08/05 Physician: Narda Amber, DO  Sex: Female Height: 5\' 9"  Ref Phys: Scarlette Calico  ID#: [id number] Temp: 33.0C Technician: Laureen Ochs R. NCS T.   Patient Complaints: Patient is a 49 year old female here for evaluation of numbness, tingling and burning in all four extremities worse on her left leg/foot and right hand.  NCV & EMG Findings: Extensive electrodiagnostic testing of the right upper extremity and left lower extremity shows:  1. Right median sensory response is prolonged with reduced amplitude. Right ulnar and radial sensory responses are within normal limits. 2. Right median motor response shows prolonged latency with preserved amplitude. Right ulnar motor responses within normal limits. 3. Left sural and superficial peroneal sensory responses are within normal limits. 4. Left ulnar motor responses within normal limits. The tibial motor response at the ankle is within normal; however, when stimulating at the popliteal fossa, amplitude was reduced and most likely technical in nature due to body habitus.  5. There is no evidence of active or chronic motor axon loss changes affecting the right upper extremity or left lower extremity. Motor unit configuration and recruitment pattern is within normal limits.   Impression: 1. Right median neuropathy at or distal to the wrist, consistent with the clinical diagnosis of carpal tunnel syndrome; moderate in degree electrically. 2. There is no evidence of a generalized sensorimotor polyneuropathy or lumbosacral radiculopathy affecting the left lower extremity.   ___________________________ Narda Amber, DO    Nerve Conduction Studies Anti Sensory Summary Table   Site NR Peak (ms) Norm Peak (ms) P-T Amp (V) Norm P-T Amp  Right Median Anti Sensory (2nd Digit)  33C    Wrist    6.3 <3.6 9.8 >15  Right Radial Anti Sensory (Base 1st Digit)  33C  Wrist    2.4 <2.7 29.7 >14  Left Sup Peroneal Anti Sensory (Ant Lat Mall)  35C  12 cm    2.8 <4.6 7.8 >4  Left Sural Anti Sensory (Lat Mall)  35C  Calf    4.0 <4.6 7.4 >4  Right Ulnar Anti Sensory (5th Digit)  33C  Wrist    3.0 <3.1 25.7 >10   Motor Summary Table   Site NR Onset (ms) Norm Onset (ms) O-P Amp (mV) Norm O-P Amp Site1 Site2 Delta-0 (ms) Dist (cm) Vel (m/s) Norm Vel (m/s)  Right Median Motor (Abd Poll Brev)  33C  Wrist    4.3 <4.0 8.5 >6 Elbow Wrist 5.8 30.0 52 >50  Elbow    10.1  7.7         Left Peroneal Motor (Ext Dig Brev)  35C  Ankle    3.6 <6.0 6.0 >2.5 B Fib Ankle 7.4 37.0 50 >40  B Fib    11.0  5.6  Poplt B Fib 2.1 11.5 55 >40  Poplt    13.1  5.5         Left Peroneal TA Motor (Tib Ant)  35C  Fib Head    2.7 <4.5 4.0 >3 Poplit Fib Head 2.0 12.0 60 >40  Poplit    4.7  3.9         Left Tibial Motor (Abd Hall Brev)  35C    body habitus behind knee  Ankle    4.5 <6.0 5.8 >4 Knee Ankle 9.3 42.0 45 >40  Knee  13.8  0.7         Right Ulnar Motor (Abd Dig Minimi)  33C  Wrist    2.1 <3.1 8.5 >7 B Elbow Wrist 4.9 27.0 55 >50  B Elbow    7.0  7.6  A Elbow B Elbow 1.8 10.0 56 >50  A Elbow    8.8  7.2          F Wave Studies   NR F-Lat (ms) Lat Norm (ms) L-R F-Lat (ms)  Left Tibial (Mrkrs) (Abd Hallucis)  35C     59.81 <55   Right Ulnar (Mrkrs) (Abd Dig Min)  33C     30.11 <33    H Reflex Studies   NR H-Lat (ms) Lat Norm (ms) L-R H-Lat (ms)  Left Tibial (Gastroc)  35C    unable to tolerate  NR  <35    EMG   Side Muscle Ins Act Fibs Psw Fasc Number Recrt Dur Dur. Amp Amp. Poly Poly. Comment  Right 1stDorInt Nml Nml Nml Nml Nml Nml Nml Nml Nml Nml Nml Nml N/A  Right FlexPolLong Nml Nml Nml Nml Nml Nml Nml Nml Nml Nml Nml Nml N/A  Right Abd Poll Brev Nml Nml Nml Nml Nml Nml Nml Nml Nml Nml Nml Nml N/A  Right PronatorTeres Nml Nml Nml Nml Nml Nml Nml Nml Nml Nml Nml Nml  N/A  Right Biceps Nml Nml Nml Nml Nml Nml Nml Nml Nml Nml Nml Nml N/A  Right Triceps Nml Nml Nml Nml Nml Nml Nml Nml Nml Nml Nml Nml N/A  Right Deltoid Nml Nml Nml Nml Nml Nml Nml Nml Nml Nml Nml Nml N/A  Left AntTibialis Nml Nml Nml Nml Nml Nml Nml Nml Nml Nml Nml Nml N/A  Left Gastroc Nml Nml Nml Nml Nml Nml Nml Nml Nml Nml Nml Nml N/A  Left RectFemoris Nml Nml Nml Nml Nml Nml Nml Nml Nml Nml Nml Nml N/A  Left GluteusMed Nml Nml Nml Nml Nml Nml Nml Nml Nml Nml Nml Nml N/A  Left BicepsFemS Nml Nml Nml Nml Nml Nml Nml Nml Nml Nml Nml Nml N/A      Waveforms:

## 2014-08-09 ENCOUNTER — Other Ambulatory Visit: Payer: Self-pay | Admitting: Internal Medicine

## 2014-08-12 ENCOUNTER — Encounter: Payer: Self-pay | Admitting: *Deleted

## 2014-08-15 ENCOUNTER — Encounter: Payer: Self-pay | Admitting: *Deleted

## 2014-08-15 ENCOUNTER — Encounter: Payer: 59 | Attending: Internal Medicine | Admitting: *Deleted

## 2014-08-15 DIAGNOSIS — E118 Type 2 diabetes mellitus with unspecified complications: Secondary | ICD-10-CM | POA: Insufficient documentation

## 2014-08-15 DIAGNOSIS — Z713 Dietary counseling and surveillance: Secondary | ICD-10-CM | POA: Insufficient documentation

## 2014-08-15 NOTE — Progress Notes (Signed)
Diabetes Self-Management Education  Visit Type:     Appt. Start Time: 0830 Appt. End Time: 0930  08/15/2014  Ms. Lorraine Holt, identified by name and date of birth, is a 49 y.o. 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 She also works regularly with her [place].  Other people present during visit:  Patient   ASSESSMENT  Farrin works 3rd shift and would like further guidance on meal planning.  Her husband is also interested in lifestyle change so he will be supportive and elpful   Subsequent Visit Information:  Since your last visit, have you continued or began the use of a meal plan?: Yes How many days a week are you following a meal plan?: Other (comment) (started a "carb modified diet", but then stopped over the holidays and isn't currently following meal plan) Since your last visit, have you continued or began to exercise on a consistent basis?: Yes How many days per week are you exercising or participating in a physicial activity for more than 20 minutes?: Other (comment) (started exercise, but isn't currently) Since your last visit have you continued or begun to take your medications as prescribed?: Yes Since your last visit have you had your blood pressure checked?: Yes Since your last visit are you checking your feet?: Yes How many days per week are you checking your feet?: 5 Since your last visit have you experienced any weight changes?: Loss Weight Loss (lbs): 10 Since your last visit, are you checking your blood glucose at least once a day?: Yes  Psychosocial:     Patient Belief/Attitude about Diabetes: Motivated to manage diabetes Self-care barriers: None Self-management support: Family Other persons present: Patient Patient Concerns: Nutrition/Meal planning Special Needs: None Preferred Learning Style: No preference indicated Learning  Readiness: Ready  Complications:   Last HgB A1C per patient/outside source: 7 mg/dL How often do you check your blood sugar?: 1-2 times/day Fasting Blood glucose range (mg/dL): 70-129 Number of hypoglycemic episodes per month: 12 (feels low, but doesn't check her glucose) Can you tell when your blood sugar is low?: Yes What do you do if your blood sugar is low?: get graham cracker Number of hyperglycemic episodes per week: 0 (doesn't check, but 1 time/day so she's nto sure if it gets high) Have you had a dilated eye exam in the past 12 months?: Yes Have you had a dental exam in the past 12 months?: No  Diet Intake:  Breakfast: 3 mini pancakes with butter and light syrup Snack (afternoon): french fries and bologna and cheese sandwich (4 pm) Snack (evening): 1/2 cup rice with cream soup and baked chicken (2 am) Beverage(s): water, tea with NNS added  Exercise:  Exercise: ADL's   Individualized Plan for Diabetes Self-Management Training:   Learning Objective:  Patient will have a greater understanding of diabetes self-management.  Patient education plan per assessed needs and concerns is to attend individual sessions    Education Topics Reviewed with Patient Today:    Role of diet in the treatment of diabetes and the relationship between the three main macronutrients and blood glucose level, Carbohydrate counting, Meal options for control of blood glucose level and chronic complications. Role of exercise on diabetes management, blood pressure control and cardiac health.   Identified appropriate SMBG and/or A1C goals., Daily foot exams, Yearly dilated eye exam Taught treatment of hypoglycemia - the 15  rule.       Lifestyle issues that need to be addressed for better diabetes care  PATIENTS GOALS/Plan (Developed by the patient):     Patient Self Evaluation of Goals - Patient rates self as meeting previously set goals:   Nutrition: < 25% Physical Activity: <  25% Medications: >75% Monitoring: 50 - 75 %   Plan:   Patient Instructions  Aim to eat every 3-5 hours Use snack handout for balancing carbs with protein (graham crackers with peanut butter) Aim for 3 meals/24 hours Aim to eat vegetables 2 meals/day, each day.  Eat lots and lots of vegetables Try to exercise on your days off Limit carbs to 45 g/meal Make dentist appoitment    Expected Outcomes:  Demonstrated interest in learning. Expect positive outcomes  Education material provided: Living Well with Diabetes, Meal plan card and Snack sheet  If problems or questions, patient to contact team via:  Phone  Future DSME appointment: - PRN

## 2014-08-15 NOTE — Patient Instructions (Signed)
Aim to eat every 3-5 hours Use snack handout for balancing carbs with protein (graham crackers with peanut butter) Aim for 3 meals/24 hours Aim to eat vegetables 2 meals/day, each day.  Eat lots and lots of vegetables Try to exercise on your days off Limit carbs to 45 g/meal Make dentist appoitment

## 2014-08-22 ENCOUNTER — Encounter: Payer: Self-pay | Admitting: Internal Medicine

## 2014-08-27 ENCOUNTER — Other Ambulatory Visit (INDEPENDENT_AMBULATORY_CARE_PROVIDER_SITE_OTHER): Payer: 59

## 2014-08-27 ENCOUNTER — Encounter: Payer: Self-pay | Admitting: Internal Medicine

## 2014-08-27 ENCOUNTER — Ambulatory Visit (INDEPENDENT_AMBULATORY_CARE_PROVIDER_SITE_OTHER): Payer: 59 | Admitting: Internal Medicine

## 2014-08-27 VITALS — BP 122/76 | HR 88 | Temp 98.3°F | Resp 16 | Ht 67.0 in | Wt 312.5 lb

## 2014-08-27 DIAGNOSIS — E118 Type 2 diabetes mellitus with unspecified complications: Secondary | ICD-10-CM

## 2014-08-27 DIAGNOSIS — I10 Essential (primary) hypertension: Secondary | ICD-10-CM

## 2014-08-27 DIAGNOSIS — Z01818 Encounter for other preprocedural examination: Secondary | ICD-10-CM | POA: Diagnosis not present

## 2014-08-27 DIAGNOSIS — G5601 Carpal tunnel syndrome, right upper limb: Secondary | ICD-10-CM

## 2014-08-27 LAB — CBC WITH DIFFERENTIAL/PLATELET
BASOS ABS: 0 10*3/uL (ref 0.0–0.1)
BASOS PCT: 0.6 % (ref 0.0–3.0)
EOS PCT: 3.9 % (ref 0.0–5.0)
Eosinophils Absolute: 0.2 10*3/uL (ref 0.0–0.7)
HCT: 39.2 % (ref 36.0–46.0)
Hemoglobin: 12.8 g/dL (ref 12.0–15.0)
Lymphocytes Relative: 44.3 % (ref 12.0–46.0)
Lymphs Abs: 2.7 10*3/uL (ref 0.7–4.0)
MCHC: 32.8 g/dL (ref 30.0–36.0)
MCV: 85 fl (ref 78.0–100.0)
Monocytes Absolute: 0.4 10*3/uL (ref 0.1–1.0)
Monocytes Relative: 6.6 % (ref 3.0–12.0)
NEUTROS PCT: 44.6 % (ref 43.0–77.0)
Neutro Abs: 2.7 10*3/uL (ref 1.4–7.7)
PLATELETS: 284 10*3/uL (ref 150.0–400.0)
RBC: 4.61 Mil/uL (ref 3.87–5.11)
RDW: 15.2 % (ref 11.5–15.5)
WBC: 6.2 10*3/uL (ref 4.0–10.5)

## 2014-08-27 LAB — BASIC METABOLIC PANEL
BUN: 16 mg/dL (ref 6–23)
CO2: 25 meq/L (ref 19–32)
Calcium: 8.9 mg/dL (ref 8.4–10.5)
Chloride: 107 mEq/L (ref 96–112)
Creatinine, Ser: 0.86 mg/dL (ref 0.40–1.20)
GFR: 89.42 mL/min (ref >60.00)
GLUCOSE: 94 mg/dL (ref 70–99)
Potassium: 3.6 mEq/L (ref 3.5–5.1)
Sodium: 138 mEq/L (ref 135–145)

## 2014-08-27 LAB — HEMOGLOBIN A1C: Hgb A1c MFr Bld: 6.9 % — ABNORMAL HIGH (ref 4.6–6.5)

## 2014-08-27 NOTE — Progress Notes (Signed)
   Subjective:    Patient ID: Lorraine Holt, female    DOB: 26-Apr-1966, 49 y.o.   MRN: [id number]  HPI Comments: She needs pre-op clearance for upcoming uterine fibroid surgery.  Hypertension This is a chronic problem. The current episode started more than 1 year ago. The problem has been gradually improving since onset. The problem is controlled. Pertinent negatives include no anxiety, blurred vision, chest pain, headaches, malaise/fatigue, neck pain, orthopnea, palpitations, peripheral edema, PND, shortness of breath or sweats. Past treatments include angiotensin blockers and diuretics. The current treatment provides significant improvement. There are no compliance problems.       Review of Systems  Constitutional: Negative.  Negative for fever, chills, malaise/fatigue, diaphoresis, appetite change and fatigue.  HENT: Negative.   Eyes: Negative.  Negative for blurred vision.  Respiratory: Negative.  Negative for apnea, cough, choking, chest tightness, shortness of breath, wheezing and stridor.   Cardiovascular: Negative.  Negative for chest pain, palpitations, orthopnea, leg swelling and PND.  Gastrointestinal: Negative.  Negative for nausea, vomiting, abdominal pain, diarrhea, constipation and blood in stool.  Endocrine: Negative.   Genitourinary: Negative.   Musculoskeletal: Negative.  Negative for myalgias, back pain, arthralgias and neck pain.  Skin: Negative.  Negative for rash.  Allergic/Immunologic: Negative.   Neurological: Negative.  Negative for dizziness, syncope, speech difficulty, weakness, light-headedness and headaches.  Hematological: Negative.  Negative for adenopathy. Does not bruise/bleed easily.  Psychiatric/Behavioral: Negative.        Objective:   Physical Exam  Constitutional: She is oriented to person, place, and time. She appears well-developed and well-nourished. No distress.  HENT:  Head: Normocephalic and atraumatic.  Mouth/Throat: Oropharynx is clear  and moist. No oropharyngeal exudate.  Eyes: Conjunctivae are normal. Right eye exhibits no discharge. Left eye exhibits no discharge. No scleral icterus.  Neck: Normal range of motion. Neck supple. No JVD present. No tracheal deviation present. No thyromegaly present.  Cardiovascular: Normal rate, regular rhythm, normal heart sounds and intact distal pulses.  Exam reveals no gallop and no friction rub.   No murmur heard. Pulmonary/Chest: Effort normal and breath sounds normal. No stridor. No respiratory distress. She has no wheezes. She has no rales. She exhibits no tenderness.  Abdominal: Soft. Bowel sounds are normal. She exhibits no distension and no mass. There is no tenderness. There is no rebound and no guarding.  Musculoskeletal: Normal range of motion. She exhibits no edema or tenderness.  Lymphadenopathy:    She has no cervical adenopathy.  Neurological: She is oriented to person, place, and time.  Skin: Skin is warm and dry. No rash noted. She is not diaphoretic. No erythema. No pallor.  Psychiatric: She has a normal mood and affect. Her behavior is normal. Judgment and thought content normal.  Vitals reviewed.    Lab Results  Component Value Date   WBC 6.3 06/26/2014   HGB 13.6 06/26/2014   HCT 41.0 06/26/2014   PLT 262.0 06/26/2014   GLUCOSE 112* 06/26/2014   CHOL 162 01/07/2014   TRIG 84.0 01/07/2014   HDL 40.40 01/07/2014   LDLCALC 105* 01/07/2014   ALT 16 06/26/2014   AST 13 06/26/2014   NA 137 06/26/2014   K 4.0 06/26/2014   CL 106 06/26/2014   CREATININE 0.73 06/26/2014   BUN 17 06/26/2014   CO2 26 06/26/2014   TSH 4.21 06/26/2014   HGBA1C 7.0* 06/26/2014   MICROALBUR 1.5 06/26/2014       Assessment & Plan:

## 2014-08-27 NOTE — Patient Instructions (Signed)

## 2014-08-27 NOTE — Progress Notes (Signed)
Pre visit review using our clinic review tool, if applicable. No additional management support is needed unless otherwise documented below in the visit note. 

## 2014-08-28 ENCOUNTER — Encounter: Payer: Self-pay | Admitting: Internal Medicine

## 2014-08-28 DIAGNOSIS — Z01818 Encounter for other preprocedural examination: Secondary | ICD-10-CM | POA: Insufficient documentation

## 2014-08-28 NOTE — Assessment & Plan Note (Signed)
Her blood sugars are adequately well controlled She agrees to improve on her lifestyle modifications

## 2014-08-28 NOTE — Assessment & Plan Note (Signed)
She has no cardiac s/s, EKG is WNL She is low risk for GYN surgery

## 2014-08-28 NOTE — Assessment & Plan Note (Signed)
She was made aware of this She tells me that it does not bother her enough to warrant any treatment at this time

## 2014-08-28 NOTE — Assessment & Plan Note (Signed)
Her EKG is normal ROS and exam are WNL Lytes and renal function are stable She is medically cleared for GYN surgery

## 2014-09-02 ENCOUNTER — Encounter: Payer: Self-pay | Admitting: Obstetrics & Gynecology

## 2014-09-02 ENCOUNTER — Ambulatory Visit (INDEPENDENT_AMBULATORY_CARE_PROVIDER_SITE_OTHER): Payer: 59 | Admitting: Obstetrics & Gynecology

## 2014-09-02 VITALS — BP 143/85 | HR 88 | Ht 67.0 in | Wt 314.8 lb

## 2014-09-02 DIAGNOSIS — D259 Leiomyoma of uterus, unspecified: Secondary | ICD-10-CM | POA: Diagnosis not present

## 2014-09-02 DIAGNOSIS — N924 Excessive bleeding in the premenopausal period: Secondary | ICD-10-CM

## 2014-09-02 NOTE — Progress Notes (Signed)
Subjective:     Patient ID: Lorraine Holt, female   DOB: [birth year], 49 y.o.   MRN: [id number]  HPI Pt presents for preop exam and discussion.  She has prev been evaluated by Dr. Harolyn Rutherford and is scheduled for a RATH with bilateral salpingectomy on April 12th.  She has sx uterine fibroids. She has had an endometrial ablation in the past but, continues to have heavy bleeding. Pt reports that she is sexually active s/p a BTL.  She reports normal sex drive but, reports significant vaginal dryness.  Has used several different lubricants in the past with little success.  She works as a Electrical engineer.    Past Medical History  Diagnosis Date  . Hypertension   . Diabetes mellitus   . Obesity   . Hyperlipidemia   . Bronchitis    Past Surgical History  Procedure Laterality Date  . Tubal ligation    . Dilation and curettage of uterus     Current Outpatient Prescriptions on File Prior to Visit  Medication Sig Dispense Refill  . glucose blood test strip Test up to twice daily 100 each 12  . hydrochlorothiazide (MICROZIDE) 12.5 MG capsule Take 1 capsule (12.5 mg total) by mouth daily. 30 capsule 11  . Linaclotide (LINZESS PO) Take by mouth.    . losartan (COZAAR) 100 MG tablet Take 1 tablet (100 mg total) by mouth daily. 30 tablet 11  . metFORMIN (GLUCOPHAGE) 500 MG tablet Take 1 tablet (500 mg total) by mouth 2 (two) times daily with a meal. 60 tablet 11  . pravastatin (PRAVACHOL) 10 MG tablet Take 1 tablet (10 mg total) by mouth daily. 30 tablet 11  . TRUEPLUS LANCETS 30G MISC USE TWICE DAILY 100 each 11  . megestrol (MEGACE) 40 MG tablet Take 1 tablet (40 mg total) by mouth 2 (two) times daily. Can increase to two tablets twice a day in the event of heavy bleeding (Patient not taking: Reported on 09/02/2014) 60 tablet 5   No current facility-administered medications on file prior to visit.   Allergies  Allergen Reactions  . Penicillins Swelling and Rash  . Amlodipine     Constipation, dizziness    History   Social History  . Marital Status: Married    Spouse Name: N/A  . Number of Children: N/A  . Years of Education: N/A   Occupational History  . Not on file.   Social History Main Topics  . Smoking status: Current Every Day Smoker -- 0.50 packs/day for 20 years    Types: Cigarettes  . Smokeless tobacco: Never Used  . Alcohol Use: No  . Drug Use: No  . Sexual Activity: Yes    Birth Control/ Protection: Surgical   Other Topics Concern  . Not on file   Social History Narrative    Review of Systems     Objective:   Physical Exam BP 143/85 mmHg  Pulse 88  Ht 5\' 7"  (1.702 m)  Wt 314 lb 12.8 oz (142.792 kg)  BMI 49.29 kg/m2  LMP 08/08/2014 Pt in NAD Abd: morbidly obese, NT, ND.  Well healed infraumbilical incision GU: EGBUS: no lesions Vagina: no blood in vault Cervix: no lesion; no mucopurulent d/c Uterus: size difficult to appreciate due to pts body habitus. Adnexa: no masses palpated (see above); non tender   CBC    Component Value Date/Time   WBC 6.2 08/27/2014 0913   RBC 4.61 08/27/2014 0913   HGB 12.8 08/27/2014 0913   HCT 39.2  08/27/2014 0913   PLT 284.0 08/27/2014 0913   MCV 85.0 08/27/2014 0913   MCHC 32.8 08/27/2014 0913   RDW 15.2 08/27/2014 0913   LYMPHSABS 2.7 08/27/2014 0913   MONOABS 0.4 08/27/2014 0913   EOSABS 0.2 08/27/2014 0913   BASOSABS 0.0 08/27/2014 0913   08/27/2014 HbbA1c %6.9      07/19/2014 CLINICAL DATA: Abnormal uterine bleeding, known uterine fibroids  EXAM: TRANSABDOMINAL AND TRANSVAGINAL ULTRASOUND OF PELVIS  TECHNIQUE: Both transabdominal and transvaginal ultrasound examinations of the pelvis were performed. Transabdominal technique was performed for global imaging of the pelvis including uterus, ovaries, adnexal regions, and pelvic cul-de-sac. It was necessary to proceed with endovaginal exam following the transabdominal exam to visualize the bilateral adnexa.  COMPARISON:  None  FINDINGS: Uterus  Measurements: 13.5 x 9.3 x 10.1 cm. Dominant transmural anterior right fundal fibroid measures 8.8 x 8.3 x 8.5 cm  Endometrium  Not visualized/displaced due to dominant fundal fibroid.  Right ovary  Not discretely visualized.  Left ovary  Not discretely visualized.  Other findings  No free fluid.  IMPRESSION: Enlarged uterus with dominant right fundal fibroid measuring up to 8.8 cm.      Assessment:     Sx uterine fibroids for preop counseling.  Uterus is possibly amenable to [facility] but, it might be challenging to get the fallopian tubes.  Pt saw videos of a TVH online and would prefer a RATH.  Given the 8cm fibroid and the fact that her uterus is >13cm will proceed as scheduled.  I have reviewed with pt the benefits and risks of each.        Plan:     Patient desires surgical management with [place] with bilateral salpingectomy.  The risks of surgery were discussed in detail with the patient including but not limited to: bleeding which may require transfusion or reoperation; infection which may require prolonged hospitalization or re-hospitalization and antibiotic therapy; injury to bowel, bladder, ureters and major vessels or other surrounding organs; need for additional procedures including laparotomy; thromboembolic phenomenon, incisional problems and other postoperative or anesthesia complications.  Patient was told that the likelihood that her condition and symptoms will be treated effectively with this surgical management was very high; the postoperative expectations were also discussed in detail. The patient also understands the alternative treatment options which were discussed in full. All questions were answered.

## 2014-09-02 NOTE — Patient Instructions (Signed)
Laparoscopically Assisted Vaginal Hysterectomy  A laparoscopically assisted vaginal hysterectomy (LAVH) is a surgical procedure to remove the uterus and cervix, and sometimes the ovaries and fallopian tubes. During an LAVH, some of the surgical removal is done through the vagina, and the rest is done through a few small surgical cuts (incisions) in the abdomen.  This procedure is usually considered in women when a vaginal hysterectomy is not an option. Your health care provider will discuss the risks and benefits of the different surgical techniques at your appointment. Generally, recovery time is faster and there are fewer complications after laparoscopic procedures than after open incisional procedures. LET YOUR HEALTH CARE PROVIDER KNOW ABOUT:   Any allergies you have.  All medicines you are taking, including vitamins, herbs, eye drops, creams, and over-the-counter medicines.  Previous problems you or members of your family have had with the use of anesthetics.  Any blood disorders you have.  Previous surgeries you have had.  Medical conditions you have. RISKS AND COMPLICATIONS Generally, this is a safe procedure. However, as with any procedure, complications can occur. Possible complications include:  Allergies to medicines.  Difficulty breathing.  Bleeding.  Infection.  Damage to other structures near your uterus and cervix. BEFORE THE PROCEDURE  Ask your health care provider about changing or stopping your regular medicines.  Take certain medicines, such as a colon-emptying preparation, as directed.  Do not eat or drink anything for at least 8 hours before your surgery.  Stop smoking if you smoke. Stopping will improve your health after surgery.  Arrange for a ride home after surgery and for help at home during recovery. PROCEDURE   An IV tube will be put into one of your veins in order to give you fluids and medicines.  You will receive medicines to relax you and  medicines that make you sleep (general anesthetic).  You may have a flexible tube (catheter) put into your bladder to drain urine.  You may have a tube put through your nose or mouth that goes into your stomach (nasogastric tube). The nasogastric tube removes digestive fluids and prevents you from feeling nauseated and from vomiting.  Tight-fitting (compression) stockings will be placed on your legs to promote circulation.  Three to four small incisions will be made in your abdomen. An incision also will be made in your vagina. Probes and tools will be inserted into the small incisions. The uterus and cervix are removed (and possibly your ovaries and fallopian tubes) through your vagina as well as through the small incisions that were made in the abdomen.  Your vagina is then sewn back to normal. AFTER THE PROCEDURE  You may have a liquid diet temporarily. You will most likely return to, and tolerate, your usual diet the day after surgery.  You will be passing urine through a catheter. It will be removed the day after surgery.  Your temperature, breathing rate, heart rate, blood pressure, and oxygen level will be monitored regularly.  You will still wear compression stockings on your legs until you are able to move around.  You will use a special device or do breathing exercises to keep your lungs clear.  You will be encouraged to walk as soon as possible. Document Released: 05/13/2011 Document Revised: 01/24/2013 Document Reviewed: 12/07/2012 ExitCare Patient Information 2015 [employer]. This information is not intended to replace advice given to you by your health care provider. Make sure you discuss any questions you have with your health care provider.  

## 2014-09-04 ENCOUNTER — Telehealth: Payer: Self-pay

## 2014-09-04 NOTE — Telephone Encounter (Signed)
Received FMLA/Short term disability paperwork via [employer] for patient. No ROI signed. Called patient and informed her she will need to come in to sign ROI in order for paperwork to be completed. Patient verbalized understanding and stated she will come in this Friday AM 09/06/14 to sign.

## 2014-09-09 ENCOUNTER — Telehealth: Payer: Self-pay

## 2014-09-09 ENCOUNTER — Encounter: Payer: Self-pay | Admitting: Obstetrics & Gynecology

## 2014-09-09 NOTE — Telephone Encounter (Signed)
FMLA/disability paperwork completed and edited to allow patient to be out of 6 weeks instead of the originally stated 4. Paperwork faxed to [employer] [phone]. Called patient and informed her of this. Patient verbalized understanding and gratitude. No further questions or concerns.

## 2014-09-09 NOTE — Telephone Encounter (Signed)
-----   Message from Lavonia Drafts, MD sent at 09/09/2014  1:15 PM EDT ----- She can do 6 weeks.  clh-S ----- Message -----    From: Geanie Logan, RN    Sent: 09/06/2014   8:01 AM      To: Lavonia Drafts, MD  Hi Dr. Ihor Dow,   Patient came in today to sign ROI for FMLA paperwork; I informed her of the 4 weeks out. 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 I told her I would verify with you before sending in paperwork. Please advise.   Thank you,  Lovena Le

## 2014-09-11 NOTE — Patient Instructions (Signed)
Your procedure is scheduled on:  Tuesday, September 17, 2014  Enter through the Main Entrance of [facility] at:  6:00 a.m.  Pick up the phone at the desk and dial [phone].  Call this number if you have problems the morning of surgery: [phone].  Remember: Do NOT eat food or drink after:  MIDNIGHT MONDAY Take these medicines the morning of surgery with a SIP OF WATER:  HYDROCHLOROTHIAZIDE, LOSARTAN, PRAVASTATIN  DO NOT TAKE METFORMIN 24 HOURS PRIOR TO SURGERY.  Do NOT wear jewelry (body piercing), metal hair clips/bobby pins, make-up, or nail polish. Do NOT wear lotions, powders, or perfumes.  You may wear deoderant. Do NOT shave for 48 hours prior to surgery. Do NOT bring valuables to the hospital. Contacts, dentures, or bridgework may not be worn into surgery. Leave suitcase in car.  After surgery it may be brought to your room.  For patients admitted to the hospital, checkout time is 11:00 AM the day of discharge. Have a responsible adult drive you home and stay with you for 24 hours after your procedure

## 2014-09-12 ENCOUNTER — Encounter (HOSPITAL_COMMUNITY): Payer: Self-pay

## 2014-09-12 ENCOUNTER — Encounter (HOSPITAL_COMMUNITY)
Admission: RE | Admit: 2014-09-12 | Discharge: 2014-09-12 | Disposition: A | Discharge status: Home / Self Care | Payer: 59 | Source: Ambulatory Visit | Attending: Obstetrics & Gynecology | Admitting: Obstetrics & Gynecology

## 2014-09-12 DIAGNOSIS — D259 Leiomyoma of uterus, unspecified: Secondary | ICD-10-CM | POA: Insufficient documentation

## 2014-09-12 DIAGNOSIS — Z01812 Encounter for preprocedural laboratory examination: Secondary | ICD-10-CM | POA: Diagnosis not present

## 2014-09-12 HISTORY — DX: Peripheral vascular disease, unspecified: I73.9

## 2014-09-12 HISTORY — DX: Carpal tunnel syndrome, right upper limb: G56.01

## 2014-09-12 HISTORY — DX: Reserved for inherently not codable concepts without codable children: IMO0001

## 2014-09-12 HISTORY — DX: Anemia, unspecified: D64.9

## 2014-09-12 LAB — ABO/RH: ABO/RH(D): A POS

## 2014-09-12 LAB — TYPE AND SCREEN
ABO/RH(D): A POS
Antibody Screen: NEGATIVE

## 2014-09-16 ENCOUNTER — Telehealth: Payer: Self-pay | Admitting: *Deleted

## 2014-09-16 MED ORDER — GENTAMICIN SULFATE 40 MG/ML IJ SOLN
INTRAVENOUS | Status: AC
  Administered 2014-09-17: 118.5 mL via INTRAVENOUS
  Filled 2014-09-16: qty 12.5

## 2014-09-16 NOTE — Anesthesia Preprocedure Evaluation (Addendum)
Anesthesia Evaluation  Patient identified by MRN, date of birth, ID band Patient awake    Reviewed: Allergy & Precautions, H&P , Patient's Chart, lab work & pertinent test results, reviewed documented beta blocker date and time   Airway Mallampati: II  TM Distance: >3 FB Neck ROM: full    Dental no notable dental hx.    Pulmonary Current Smoker,  breath sounds clear to auscultation  Pulmonary exam normal       Cardiovascular hypertension, Rhythm:regular Rate:Normal     Neuro/Psych    GI/Hepatic   Endo/Other  diabetes  Renal/GU      Musculoskeletal   Abdominal   Peds  Hematology   Anesthesia Other Findings Hypertension ; 2 meds No CAD Sx  Diabetes mellitus no Insulin; oral agents Am BS 103  Obesity   Bronchitis  Chest clear, no asthma Peripheral vascular disease "numbness in legs"  Carpal tunnel syndrome   Shortness of breath..... Dyspnea with exertion           Reproductive/Obstetrics                           Anesthesia Physical Anesthesia Plan  ASA: III  Anesthesia Plan: General   Post-op Pain Management:    Induction: Intravenous  Airway Management Planned: Oral ETT  Additional Equipment:   Intra-op Plan:   Post-operative Plan: Extubation in OR  Informed Consent: I have reviewed the patients History and Physical, chart, labs and discussed the procedure including the risks, benefits and alternatives for the proposed anesthesia with the patient or authorized representative who has indicated his/her understanding and acceptance.   Dental Advisory Given and Dental advisory given  Plan Discussed with: CRNA and Surgeon  Anesthesia Plan Comments: (  Discussed general anesthesia, including possible nausea, instrumentation of airway, sore throat,pulmonary aspiration, etc. I asked if the were any outstanding questions, or  concerns before we proceeded. )         Anesthesia Quick Evaluation

## 2014-09-16 NOTE — Telephone Encounter (Signed)
Located disability insurance forms . Called Lorraine Holt and notified her i located her forms and we need her to come in and sign a release before they can be faxed and will be done as soon as possible , within 7-10 business days-sooner if possible.

## 2014-09-16 NOTE — Telephone Encounter (Signed)
Lorraine Holt left a message that she is calling regarding her insurance forms she had faxed over from [place]. States they got the papers to her late and so she if just getting them to [place]. States her surgery is tomorrow.  Wants to know if we can look over the papers especially the parts she filled out sections 3 &4 on the first sheet.  States she wants [place] to make sure she has it right and that the second page is for [place] to fill out.  Asks if we can fax it all together. Also wanted to know if we can mail copies of all the others we did- [employer], [employer] and [place].  States she is off work today.    Did not find forms from [place] to fill out. Called [place] and she states she had them faxed from somewhere else in the hospital today because she was here paying a bill and we were not open yet.  States the other forms were done recently in early April she thinks.  I informed her I did not find the forms yet, but I will ask front office if they came. I informed her I can not guarantee her they will be filled out immediately because we usually allow 7-10 business days.  And that we could not fax without her signed release, which she states she signed a release with the forms.  I informed her we will call back if we cannot find the forms.

## 2014-09-17 ENCOUNTER — Encounter (HOSPITAL_COMMUNITY): Payer: Self-pay | Admitting: Anesthesiology

## 2014-09-17 ENCOUNTER — Ambulatory Visit (HOSPITAL_COMMUNITY): Payer: 59 | Admitting: Anesthesiology

## 2014-09-17 ENCOUNTER — Encounter (HOSPITAL_COMMUNITY)
Admission: RE | Disposition: A | Discharge status: Home / Self Care | Payer: Self-pay | Source: Ambulatory Visit | Attending: Obstetrics & Gynecology

## 2014-09-17 ENCOUNTER — Observation Stay (HOSPITAL_COMMUNITY)
Admission: RE | Admit: 2014-09-17 | Discharge: 2014-09-18 | Disposition: A | Discharge status: Home / Self Care | Payer: 59 | Source: Ambulatory Visit | Attending: Obstetrics & Gynecology | Admitting: Obstetrics & Gynecology

## 2014-09-17 DIAGNOSIS — E785 Hyperlipidemia, unspecified: Secondary | ICD-10-CM | POA: Insufficient documentation

## 2014-09-17 DIAGNOSIS — F17210 Nicotine dependence, cigarettes, uncomplicated: Secondary | ICD-10-CM | POA: Diagnosis not present

## 2014-09-17 DIAGNOSIS — D251 Intramural leiomyoma of uterus: Secondary | ICD-10-CM | POA: Diagnosis not present

## 2014-09-17 DIAGNOSIS — I10 Essential (primary) hypertension: Secondary | ICD-10-CM | POA: Insufficient documentation

## 2014-09-17 DIAGNOSIS — E119 Type 2 diabetes mellitus without complications: Secondary | ICD-10-CM | POA: Insufficient documentation

## 2014-09-17 DIAGNOSIS — D649 Anemia, unspecified: Secondary | ICD-10-CM | POA: Diagnosis not present

## 2014-09-17 DIAGNOSIS — D219 Benign neoplasm of connective and other soft tissue, unspecified: Secondary | ICD-10-CM | POA: Diagnosis present

## 2014-09-17 DIAGNOSIS — Z888 Allergy status to other drugs, medicaments and biological substances status: Secondary | ICD-10-CM | POA: Diagnosis not present

## 2014-09-17 DIAGNOSIS — D259 Leiomyoma of uterus, unspecified: Principal | ICD-10-CM | POA: Insufficient documentation

## 2014-09-17 DIAGNOSIS — Z79899 Other long term (current) drug therapy: Secondary | ICD-10-CM | POA: Insufficient documentation

## 2014-09-17 DIAGNOSIS — N939 Abnormal uterine and vaginal bleeding, unspecified: Secondary | ICD-10-CM | POA: Diagnosis not present

## 2014-09-17 DIAGNOSIS — Z9889 Other specified postprocedural states: Secondary | ICD-10-CM

## 2014-09-17 DIAGNOSIS — Z880 Allergy status to penicillin: Secondary | ICD-10-CM | POA: Diagnosis not present

## 2014-09-17 HISTORY — PX: ROBOTIC ASSISTED TOTAL HYSTERECTOMY: SHX6085

## 2014-09-17 HISTORY — PX: BILATERAL SALPINGECTOMY: SHX5743

## 2014-09-17 LAB — GLUCOSE, CAPILLARY
GLUCOSE-CAPILLARY: 145 mg/dL — AB (ref 70–99)
GLUCOSE-CAPILLARY: 222 mg/dL — AB (ref 70–99)
Glucose-Capillary: 103 mg/dL — ABNORMAL HIGH (ref 70–99)

## 2014-09-17 LAB — TYPE AND SCREEN
ABO/RH(D): A POS
Antibody Screen: NEGATIVE

## 2014-09-17 LAB — PREGNANCY, URINE: Preg Test, Ur: NEGATIVE

## 2014-09-17 SURGERY — ROBOTIC ASSISTED TOTAL HYSTERECTOMY
Anesthesia: General | Site: Abdomen | Laterality: Right

## 2014-09-17 MED ORDER — GLYCOPYRROLATE 0.2 MG/ML IJ SOLN
INTRAMUSCULAR | Status: AC
  Filled 2014-09-17: qty 3

## 2014-09-17 MED ORDER — METHYLENE BLUE 1 % INJ SOLN
INTRAMUSCULAR | Status: AC
  Filled 2014-09-17: qty 1

## 2014-09-17 MED ORDER — NEOSTIGMINE METHYLSULFATE 10 MG/10ML IV SOLN
INTRAVENOUS | Status: AC
  Filled 2014-09-17: qty 1

## 2014-09-17 MED ORDER — OXYCODONE-ACETAMINOPHEN 5-325 MG PO TABS
1.0000 | ORAL_TABLET | ORAL | Status: DC | PRN
  Administered 2014-09-18: 2 via ORAL
  Administered 2014-09-18: 1 via ORAL
  Administered 2014-09-18: 2 via ORAL
  Filled 2014-09-17 (×2): qty 2
  Filled 2014-09-17: qty 1

## 2014-09-17 MED ORDER — ONDANSETRON HCL 4 MG PO TABS: 4.0000 mg | ORAL_TABLET | Freq: Four times a day (QID) | ORAL | Status: DC | PRN

## 2014-09-17 MED ORDER — IBUPROFEN 600 MG PO TABS
600.0000 mg | ORAL_TABLET | Freq: Four times a day (QID) | ORAL | Status: DC | PRN
  Administered 2014-09-18: 600 mg via ORAL
  Filled 2014-09-17: qty 1

## 2014-09-17 MED ORDER — HYDROMORPHONE HCL 1 MG/ML IJ SOLN
INTRAMUSCULAR | Status: AC
  Filled 2014-09-17: qty 1

## 2014-09-17 MED ORDER — LOSARTAN POTASSIUM 50 MG PO TABS
100.0000 mg | ORAL_TABLET | Freq: Every day | ORAL | Status: DC
  Filled 2014-09-17 (×2): qty 2

## 2014-09-17 MED ORDER — DEXAMETHASONE SODIUM PHOSPHATE 10 MG/ML IJ SOLN
INTRAMUSCULAR | Status: DC | PRN
  Administered 2014-09-17: 4 mg via INTRAVENOUS

## 2014-09-17 MED ORDER — FENTANYL CITRATE 0.05 MG/ML IJ SOLN
INTRAMUSCULAR | Status: DC | PRN
  Administered 2014-09-17 (×9): 50 ug via INTRAVENOUS

## 2014-09-17 MED ORDER — DEXAMETHASONE SODIUM PHOSPHATE 4 MG/ML IJ SOLN
INTRAMUSCULAR | Status: AC
  Filled 2014-09-17: qty 1

## 2014-09-17 MED ORDER — PROPOFOL 10 MG/ML IV BOLUS
INTRAVENOUS | Status: AC
  Filled 2014-09-17: qty 20

## 2014-09-17 MED ORDER — SCOPOLAMINE 1 MG/3DAYS TD PT72
MEDICATED_PATCH | TRANSDERMAL | Status: AC
  Filled 2014-09-17: qty 1

## 2014-09-17 MED ORDER — DOCUSATE SODIUM 100 MG PO CAPS
100.0000 mg | ORAL_CAPSULE | Freq: Two times a day (BID) | ORAL | Status: DC
  Administered 2014-09-17 – 2014-09-18 (×2): 100 mg via ORAL
  Filled 2014-09-17 (×3): qty 1

## 2014-09-17 MED ORDER — PANTOPRAZOLE SODIUM 40 MG PO TBEC
40.0000 mg | DELAYED_RELEASE_TABLET | Freq: Every day | ORAL | Status: DC
  Administered 2014-09-17 – 2014-09-18 (×2): 40 mg via ORAL
  Filled 2014-09-17 (×2): qty 1

## 2014-09-17 MED ORDER — FENTANYL CITRATE 0.05 MG/ML IJ SOLN
INTRAMUSCULAR | Status: AC
  Filled 2014-09-17: qty 2

## 2014-09-17 MED ORDER — ROCURONIUM BROMIDE 100 MG/10ML IV SOLN
INTRAVENOUS | Status: AC
  Filled 2014-09-17: qty 1

## 2014-09-17 MED ORDER — KETOROLAC TROMETHAMINE 30 MG/ML IJ SOLN: 30.0000 mg | Freq: Four times a day (QID) | INTRAMUSCULAR | Status: AC

## 2014-09-17 MED ORDER — INSULIN ASPART 100 UNIT/ML ~~LOC~~ SOLN
0.0000 [IU] | Freq: Three times a day (TID) | SUBCUTANEOUS | Status: DC
  Administered 2014-09-18: 3 [IU] via SUBCUTANEOUS

## 2014-09-17 MED ORDER — KETOROLAC TROMETHAMINE 30 MG/ML IJ SOLN
INTRAMUSCULAR | Status: AC
  Filled 2014-09-17: qty 1

## 2014-09-17 MED ORDER — PROPOFOL 10 MG/ML IV BOLUS
INTRAVENOUS | Status: DC | PRN
  Administered 2014-09-17: 200 mg via INTRAVENOUS

## 2014-09-17 MED ORDER — SIMETHICONE 80 MG PO CHEW: 80.0000 mg | CHEWABLE_TABLET | Freq: Four times a day (QID) | ORAL | Status: DC | PRN

## 2014-09-17 MED ORDER — KETOROLAC TROMETHAMINE 30 MG/ML IJ SOLN
30.0000 mg | Freq: Once | INTRAMUSCULAR | Status: AC
  Administered 2014-09-17: 30 mg via INTRAVENOUS

## 2014-09-17 MED ORDER — FENTANYL CITRATE 0.05 MG/ML IJ SOLN
INTRAMUSCULAR | Status: AC
Start: 2014-09-17 — End: 2014-09-17
  Filled 2014-09-17: qty 2

## 2014-09-17 MED ORDER — BISACODYL 10 MG RE SUPP
10.0000 mg | Freq: Every day | RECTAL | Status: DC | PRN
  Filled 2014-09-17: qty 1

## 2014-09-17 MED ORDER — METHYLENE BLUE 1 % INJ SOLN
INTRAMUSCULAR | Status: AC
  Filled 2014-09-17: qty 10

## 2014-09-17 MED ORDER — METFORMIN HCL 500 MG PO TABS
500.0000 mg | ORAL_TABLET | Freq: Two times a day (BID) | ORAL | Status: DC
  Administered 2014-09-17 – 2014-09-18 (×2): 500 mg via ORAL
  Filled 2014-09-17 (×4): qty 1

## 2014-09-17 MED ORDER — LACTATED RINGERS IV SOLN
INTRAVENOUS | Status: DC | PRN
  Administered 2014-09-17: 08:00:00 via INTRAVENOUS

## 2014-09-17 MED ORDER — ONDANSETRON HCL 4 MG/2ML IJ SOLN: 4.0000 mg | Freq: Four times a day (QID) | INTRAMUSCULAR | Status: DC | PRN

## 2014-09-17 MED ORDER — BUPIVACAINE HCL (PF) 0.5 % IJ SOLN
INTRAMUSCULAR | Status: AC
  Filled 2014-09-17: qty 30

## 2014-09-17 MED ORDER — GLYCOPYRROLATE 0.2 MG/ML IJ SOLN
INTRAMUSCULAR | Status: DC | PRN
  Administered 2014-09-17: 0.6 mg via INTRAVENOUS

## 2014-09-17 MED ORDER — BUPIVACAINE HCL (PF) 0.5 % IJ SOLN
INTRAMUSCULAR | Status: DC | PRN
  Administered 2014-09-17: 30 mL

## 2014-09-17 MED ORDER — ONDANSETRON HCL 4 MG/2ML IJ SOLN
INTRAMUSCULAR | Status: AC
  Filled 2014-09-17: qty 2

## 2014-09-17 MED ORDER — LACTATED RINGERS IV SOLN
INTRAVENOUS | Status: DC
  Administered 2014-09-17 (×2): via INTRAVENOUS

## 2014-09-17 MED ORDER — NEOSTIGMINE METHYLSULFATE 10 MG/10ML IV SOLN
INTRAVENOUS | Status: DC | PRN
  Administered 2014-09-17: 4 mg via INTRAVENOUS

## 2014-09-17 MED ORDER — ZOLPIDEM TARTRATE 5 MG PO TABS: 5.0000 mg | ORAL_TABLET | Freq: Every evening | ORAL | Status: DC | PRN

## 2014-09-17 MED ORDER — METHYLENE BLUE 1 % INJ SOLN
INTRAMUSCULAR | Status: DC | PRN
  Administered 2014-09-17: 7 mL via INTRAVENOUS

## 2014-09-17 MED ORDER — MIDAZOLAM HCL 2 MG/2ML IJ SOLN
INTRAMUSCULAR | Status: AC
  Filled 2014-09-17: qty 2

## 2014-09-17 MED ORDER — METFORMIN HCL 500 MG PO TABS
500.0000 mg | ORAL_TABLET | Freq: Two times a day (BID) | ORAL | Status: DC
  Filled 2014-09-17: qty 1

## 2014-09-17 MED ORDER — POLYETHYLENE GLYCOL 3350 17 G PO PACK
17.0000 g | PACK | Freq: Every day | ORAL | Status: DC | PRN
Start: 2014-09-17 — End: 2014-09-18

## 2014-09-17 MED ORDER — CLINDAMYCIN PHOSPHATE 900 MG/50ML IV SOLN
900.0000 mg | Freq: Three times a day (TID) | INTRAVENOUS | Status: AC
  Administered 2014-09-17: 900 mg via INTRAVENOUS
  Filled 2014-09-17: qty 50

## 2014-09-17 MED ORDER — HYDROMORPHONE HCL 1 MG/ML IJ SOLN
0.2000 mg | INTRAMUSCULAR | Status: DC | PRN
  Administered 2014-09-17: 0.6 mg via INTRAVENOUS
  Administered 2014-09-17: 0.5 mg via INTRAVENOUS
  Administered 2014-09-18: 0.6 mg via INTRAVENOUS
  Filled 2014-09-17 (×3): qty 1

## 2014-09-17 MED ORDER — LACTATED RINGERS IR SOLN
Status: DC | PRN
  Administered 2014-09-17: 3000 mL

## 2014-09-17 MED ORDER — MIDAZOLAM HCL 2 MG/2ML IJ SOLN
INTRAMUSCULAR | Status: DC | PRN
  Administered 2014-09-17 (×2): 1 mg via INTRAVENOUS

## 2014-09-17 MED ORDER — LIDOCAINE HCL (CARDIAC) 20 MG/ML IV SOLN
INTRAVENOUS | Status: DC | PRN
  Administered 2014-09-17: 30 mg via INTRAVENOUS
  Administered 2014-09-17: 70 mg via INTRAVENOUS

## 2014-09-17 MED ORDER — LIDOCAINE HCL (CARDIAC) 20 MG/ML IV SOLN
INTRAVENOUS | Status: AC
  Filled 2014-09-17: qty 5

## 2014-09-17 MED ORDER — DEXTROSE-NACL 5-0.45 % IV SOLN
INTRAVENOUS | Status: DC
  Administered 2014-09-17 (×2): via INTRAVENOUS

## 2014-09-17 MED ORDER — STERILE WATER FOR IRRIGATION IR SOLN
Status: DC | PRN
  Administered 2014-09-17: 1000 mL

## 2014-09-17 MED ORDER — FENTANYL CITRATE 0.05 MG/ML IJ SOLN
INTRAMUSCULAR | Status: AC
  Filled 2014-09-17: qty 5

## 2014-09-17 MED ORDER — ROCURONIUM BROMIDE 100 MG/10ML IV SOLN
INTRAVENOUS | Status: DC | PRN
  Administered 2014-09-17: 10 mg via INTRAVENOUS
  Administered 2014-09-17: 50 mg via INTRAVENOUS
  Administered 2014-09-17: 10 mg via INTRAVENOUS

## 2014-09-17 MED ORDER — HYDROMORPHONE HCL 1 MG/ML IJ SOLN
0.2500 mg | INTRAMUSCULAR | Status: DC | PRN
  Administered 2014-09-17: 0.5 mg via INTRAVENOUS

## 2014-09-17 MED ORDER — HYDROCHLOROTHIAZIDE 12.5 MG PO CAPS
12.5000 mg | ORAL_CAPSULE | Freq: Every day | ORAL | Status: DC
  Filled 2014-09-17 (×2): qty 1

## 2014-09-17 MED ORDER — SCOPOLAMINE 1 MG/3DAYS TD PT72
1.0000 | MEDICATED_PATCH | Freq: Once | TRANSDERMAL | Status: DC
  Administered 2014-09-17: 1.5 mg via TRANSDERMAL

## 2014-09-17 MED ORDER — KETOROLAC TROMETHAMINE 30 MG/ML IJ SOLN
30.0000 mg | Freq: Four times a day (QID) | INTRAMUSCULAR | Status: AC
Start: 2014-09-17 — End: 2014-09-18
  Administered 2014-09-17 (×2): 30 mg via INTRAVENOUS
  Filled 2014-09-17 (×2): qty 1

## 2014-09-17 SURGICAL SUPPLY — 65 items
APPLIER CLIP 5 13 M/L LIGAMAX5 (MISCELLANEOUS)
BARRIER ADHS 3X4 INTERCEED (GAUZE/BANDAGES/DRESSINGS) IMPLANT
BENZOIN TINCTURE PRP APPL 2/3 (GAUZE/BANDAGES/DRESSINGS) IMPLANT
BLADE SURG 10 STRL SS (BLADE) ×3 IMPLANT
CATH FOLEY 3WAY  5CC 16FR (CATHETERS) ×1
CATH FOLEY 3WAY 5CC 16FR (CATHETERS) ×2 IMPLANT
CLIP APPLIE 5 13 M/L LIGAMAX5 (MISCELLANEOUS) IMPLANT
CLOTH BEACON ORANGE TIMEOUT ST (SAFETY) ×3 IMPLANT
CONT PATH 16OZ SNAP LID 3702 (MISCELLANEOUS) ×3 IMPLANT
COVER BACK TABLE 60X90IN (DRAPES) ×6 IMPLANT
COVER TIP SHEARS 8 DVNC (MISCELLANEOUS) ×2 IMPLANT
COVER TIP SHEARS 8MM DA VINCI (MISCELLANEOUS) ×1
DECANTER SPIKE VIAL GLASS SM (MISCELLANEOUS) ×3 IMPLANT
DEVICE TROCAR PUNCTURE CLOSURE (ENDOMECHANICALS) IMPLANT
DRAPE WARM FLUID 44X44 (DRAPE) ×3 IMPLANT
DRESSING OPSITE X SMALL 2X3 (GAUZE/BANDAGES/DRESSINGS) ×3 IMPLANT
DURAPREP 26ML APPLICATOR (WOUND CARE) ×3 IMPLANT
ELECT REM PT RETURN 9FT ADLT (ELECTROSURGICAL) ×3
ELECTRODE REM PT RTRN 9FT ADLT (ELECTROSURGICAL) ×2 IMPLANT
GAUZE VASELINE 3X9 (GAUZE/BANDAGES/DRESSINGS) IMPLANT
GLOVE BIO SURGEON STRL SZ7 (GLOVE) ×15 IMPLANT
GLOVE BIOGEL PI IND STRL 7.0 (GLOVE) ×20 IMPLANT
GLOVE BIOGEL PI INDICATOR 7.0 (GLOVE) ×10
KIT ACCESSORY DA VINCI DISP (KITS) ×1
KIT ACCESSORY DVNC DISP (KITS) ×2 IMPLANT
LEGGING LITHOTOMY PAIR STRL (DRAPES) ×3 IMPLANT
LIQUID BAND (GAUZE/BANDAGES/DRESSINGS) ×6 IMPLANT
NEEDLE HYPO 22GX1.5 SAFETY (NEEDLE) ×3 IMPLANT
NEEDLE INSUFFLATION 120MM (ENDOMECHANICALS) ×3 IMPLANT
OCCLUDER COLPOPNEUMO (BALLOONS) ×3 IMPLANT
PACK ROBOT WH (CUSTOM PROCEDURE TRAY) ×3 IMPLANT
PACK ROBOTIC GOWN (GOWN DISPOSABLE) ×3 IMPLANT
PAD POSITIONER PINK NONSTERILE (MISCELLANEOUS) ×3 IMPLANT
PAD PREP 24X48 CUFFED NSTRL (MISCELLANEOUS) ×6 IMPLANT
SET CYSTO W/LG BORE CLAMP LF (SET/KITS/TRAYS/PACK) ×3 IMPLANT
SET IRRIG TUBING LAPAROSCOPIC (IRRIGATION / IRRIGATOR) ×3 IMPLANT
SET TRI-LUMEN FLTR TB AIRSEAL (TUBING) ×3 IMPLANT
STRIP CLOSURE SKIN 1/2X4 (GAUZE/BANDAGES/DRESSINGS) ×3 IMPLANT
SURGIFLO W/THROMBIN 8M KIT (HEMOSTASIS) IMPLANT
SUT DVC VLOC 180 0 12IN GS21 (SUTURE) ×3
SUT VIC AB 0 CT1 27 (SUTURE) ×2
SUT VIC AB 0 CT1 27XBRD ANBCTR (SUTURE) ×4 IMPLANT
SUT VIC AB 0 CT1 27XBRD ANTBC (SUTURE) IMPLANT
SUT VICRYL 0 UR6 27IN ABS (SUTURE) ×6 IMPLANT
SUT VICRYL 4-0 PS2 18IN ABS (SUTURE) ×6 IMPLANT
SUT VLOC 180 0 9IN  GS21 (SUTURE) ×2
SUT VLOC 180 0 9IN GS21 (SUTURE) ×4 IMPLANT
SUTURE DVC VLC 180 0 12IN GS21 (SUTURE) ×2 IMPLANT
SYR CONTROL 10ML LL (SYRINGE) ×3 IMPLANT
SYRINGE 10CC LL (SYRINGE) ×3 IMPLANT
SYSTEM CONVERTIBLE TROCAR (TROCAR) IMPLANT
TIP RUMI ORANGE 6.7MMX12CM (TIP) ×3 IMPLANT
TIP UTERINE 5.1X6CM LAV DISP (MISCELLANEOUS) IMPLANT
TIP UTERINE 6.7X10CM GRN DISP (MISCELLANEOUS) IMPLANT
TIP UTERINE 6.7X6CM WHT DISP (MISCELLANEOUS) IMPLANT
TIP UTERINE 6.7X8CM BLUE DISP (MISCELLANEOUS) IMPLANT
TOWEL OR 17X24 6PK STRL BLUE (TOWEL DISPOSABLE) ×6 IMPLANT
TROCAR 12M 150ML BLUNT (TROCAR) ×3 IMPLANT
TROCAR DILATING TIP 12MM 150MM (ENDOMECHANICALS) ×3 IMPLANT
TROCAR DISP BLADELESS 8 DVNC (TROCAR) ×4 IMPLANT
TROCAR DISP BLADELESS 8MM (TROCAR) ×2
TROCAR PORT AIRSEAL 5X120 (TROCAR) ×3 IMPLANT
TROCAR XCEL 12X100 BLDLESS (ENDOMECHANICALS) ×3 IMPLANT
TROCAR XCEL NON-BLD 5MMX100MML (ENDOMECHANICALS) ×3 IMPLANT
WATER STERILE IRR 1000ML POUR (IV SOLUTION) ×9 IMPLANT

## 2014-09-17 NOTE — Anesthesia Procedure Notes (Signed)
Procedure Name: Intubation Date/Time: 09/17/2014 7:42 AM Performed by: Tobin Chad Pre-anesthesia Checklist: Patient identified, Timeout performed, Emergency Drugs available, Suction available and Patient being monitored Patient Re-evaluated:Patient Re-evaluated prior to inductionOxygen Delivery Method: Circle system utilized and Simple face mask Preoxygenation: Pre-oxygenation with 100% oxygen Intubation Type: IV induction Ventilation: Mask ventilation without difficulty Laryngoscope Size: Mac and 3 Grade View: Grade II Tube type: Oral Tube size: 7.0 mm Number of attempts: 1 Airway Equipment and Method: Rigid stylet Placement Confirmation: ETT inserted through vocal cords under direct vision,  positive ETCO2 and breath sounds checked- equal and bilateral Secured at: 21 (com) cm Tube secured with: Tape

## 2014-09-17 NOTE — Addendum Note (Signed)
Addendum  created 09/17/14 1635 by Raenette Rover, CRNA   Modules edited: Notes Section   Notes Section:  File: [id number]

## 2014-09-17 NOTE — H&P (Signed)
HPI 49 yo [id number].   Pt presents for preop exam and discussion. She has prev been evaluated by Dr. Harolyn Rutherford and is scheduled for a RATH with bilateral salpingectomy on April 12th. She has sx uterine fibroids. She has had an endometrial ablation in the past but, continues to have heavy bleeding. Pt reports that she is sexually active s/p a BTL. She reports normal sex drive but, reports significant vaginal dryness. Has used several different lubricants in the past with little success. She works as a Electrical engineer.   Past Medical History  Diagnosis Date  . Hypertension   . Diabetes mellitus   . Obesity   . Hyperlipidemia   . Bronchitis    Past Surgical History  Procedure Laterality Date  . Tubal ligation    . Dilation and curettage of uterus     Current Outpatient Prescriptions on File Prior to Visit  Medication Sig Dispense Refill  . glucose blood test strip Test up to twice daily 100 each 12  . hydrochlorothiazide (MICROZIDE) 12.5 MG capsule Take 1 capsule (12.5 mg total) by mouth daily. 30 capsule 11  . Linaclotide (LINZESS PO) Take by mouth.    . losartan (COZAAR) 100 MG tablet Take 1 tablet (100 mg total) by mouth daily. 30 tablet 11  . metFORMIN (GLUCOPHAGE) 500 MG tablet Take 1 tablet (500 mg total) by mouth 2 (two) times daily with a meal. 60 tablet 11  . pravastatin (PRAVACHOL) 10 MG tablet Take 1 tablet (10 mg total) by mouth daily. 30 tablet 11  . TRUEPLUS LANCETS 30G MISC USE TWICE DAILY 100 each 11  . megestrol (MEGACE) 40 MG tablet Take 1 tablet (40 mg total) by mouth 2 (two) times daily. Can increase to two tablets twice a day in the event of heavy bleeding (Patient not taking: Reported on 09/02/2014) 60 tablet 5   No current facility-administered medications on file prior to visit.   Allergies  Allergen Reactions  . Penicillins Swelling and Rash  . Amlodipine     Constipation,  dizziness   History   Social History  . Marital Status: Married    Spouse Name: N/A  . Number of Children: N/A  . Years of Education: N/A   Occupational History  . Not on file.   Social History Main Topics  . Smoking status: Current Every Day Smoker -- 0.50 packs/day for 20 years    Types: Cigarettes  . Smokeless tobacco: Never Used  . Alcohol Use: No  . Drug Use: No  . Sexual Activity: Yes    Birth Control/ Protection: Surgical   Other Topics Concern  . Not on file   Social History Narrative    Review of Systems     Objective:   Physical Exam BP 133/87 mmHg  Pulse 100  Temp(Src) 97.9 F (36.6 C) (Oral)  Resp 20  Ht 5' 10.5" (1.791 m)  Wt 315 lb 6 oz (143.053 kg)  BMI 44.60 kg/m2  SpO2 96% Pt in NAD Lungs: CV:  Abd: morbidly obese, NT, ND. Well healed infraumbilical incision GU: EGBUS: no lesions Vagina: no blood in vault Cervix: no lesion; no mucopurulent d/c Uterus: size difficult to appreciate due to pts body habitus. Adnexa: no masses palpated (see above); non tender   CBC  Labs (Brief)       Component Value Date/Time   WBC 6.2 08/27/2014 0913   RBC 4.61 08/27/2014 0913   HGB 12.8 08/27/2014 0913   HCT 39.2 08/27/2014 0913   PLT  284.0 08/27/2014 0913   MCV 85.0 08/27/2014 0913   MCHC 32.8 08/27/2014 0913   RDW 15.2 08/27/2014 0913   LYMPHSABS 2.7 08/27/2014 0913   MONOABS 0.4 08/27/2014 0913   EOSABS 0.2 08/27/2014 0913   BASOSABS 0.0 08/27/2014 0913     08/27/2014 HbbA1c %6.9     07/19/2014 CLINICAL DATA: Abnormal uterine bleeding, known uterine fibroids  EXAM: TRANSABDOMINAL AND TRANSVAGINAL ULTRASOUND OF PELVIS  TECHNIQUE: Both transabdominal and transvaginal ultrasound examinations of the pelvis were performed. Transabdominal technique was performed for global imaging of the pelvis including uterus, ovaries,  adnexal regions, and pelvic cul-de-sac. It was necessary to proceed with endovaginal exam following the transabdominal exam to visualize the bilateral adnexa.  COMPARISON: None  FINDINGS: Uterus  Measurements: 13.5 x 9.3 x 10.1 cm. Dominant transmural anterior right fundal fibroid measures 8.8 x 8.3 x 8.5 cm  Endometrium  Not visualized/displaced due to dominant fundal fibroid.  Right ovary  Not discretely visualized.  Left ovary  Not discretely visualized.  Other findings  No free fluid.  IMPRESSION: Enlarged uterus with dominant right fundal fibroid measuring up to 8.8 cm.      Assessment:     Sx uterine fibroids for preop counseling. Uterus is possibly amenable to [facility] but, it might be challenging to get the fallopian tubes. Pt saw videos of a TVH online and would prefer a RATH. Given the 8cm fibroid and the fact that her uterus is >13cm will proceed as scheduled. I have reviewed with pt the benefits and risks of each.     Plan:     Patient desires surgical management with [place] with bilateral salpingectomy. The risks of surgery were discussed in detail with the patient including but not limited to: bleeding which may require transfusion or reoperation; infection which may require prolonged hospitalization or re-hospitalization and antibiotic therapy; injury to bowel, bladder, ureters and major vessels or other surrounding organs; need for additional procedures including laparotomy; thromboembolic phenomenon, incisional problems and other postoperative or anesthesia complications. Patient was told that the likelihood that her condition and symptoms will be treated effectively with this surgical management was very high; the postoperative expectations were also discussed in detail. The patient also understands the alternative treatment options which were discussed in full. All questions were answered.

## 2014-09-17 NOTE — Anesthesia Postprocedure Evaluation (Signed)
  Anesthesia Post-op Note  Patient: Lorraine Holt  Procedure(s) Performed: Procedure(s): ROBOTIC ASSISTED TOTAL HYSTERECTOMY (N/A) RIGHT SALPINGECTOMY (Right)  Patient Location: [facility] Unit  Anesthesia Type:General  Level of Consciousness: awake, alert , oriented and patient cooperative  Airway and Oxygen Therapy: Patient Spontanous Breathing and Patient connected to nasal cannula oxygen  Post-op Pain: none  Post-op Assessment: Post-op Vital signs reviewed, Patient's Cardiovascular Status Stable, Respiratory Function Stable, Patent Airway and No signs of Nausea or vomiting  Post-op Vital Signs: Reviewed and stable  Last Vitals:  Filed Vitals:   09/17/14 1500  BP: 123/86  Pulse: 80  Temp: 36.4 C  Resp: 18    Complications: No apparent anesthesia complications

## 2014-09-17 NOTE — Op Note (Signed)
09/17/2014  11:19 AM  PATIENT:  Lorraine Holt  49 y.o. female  PRE-OPERATIVE DIAGNOSIS:  Abnormal uterine bleeding  POST-OPERATIVE DIAGNOSIS:  Abnormal uterine bleeding  PROCEDURE:  Procedure(s): ROBOTIC ASSISTED TOTAL HYSTERECTOMY (N/A) RIGHT SALPINGECTOMY (Right)  SURGEON:  Surgeon(s) and Role:    * Lorraine Drafts, MD - Primary    * Lorraine Oman, MD  ANESTHESIA:   general  EBL:  Total I/O In: 1800 [I.V.:1800] Out: 600 [Urine:100; Other:50; Blood:450]  BLOOD ADMINISTERED:none  DRAINS: none   LOCAL MEDICATIONS USED:  MARCAINE     SPECIMEN:  Source of Specimen:  uterus, cervix and right fallopian tube  DISPOSITION OF SPECIMEN:  PATHOLOGY  COUNTS:  YES  TOURNIQUET:  * No tourniquets in log *  DICTATION: .Note written in EPIC  PLAN OF CARE: Admit for overnight observation  PATIENT DISPOSITION:  PACU - hemodynamically stable.   Delay start of Pharmacological VTE agent (>24hrs) due to surgical blood loss or risk of bleeding: yes  Complications: none immediate   Indications: 49 yo G5P4014 pt with multiple co-morbid conditions including obesity; diabetes, HTN and anemia who has bleeding and had large sx uterine fibroids.     The risks, benefits, and alternatives of surgery were explained, understood, and accepted. Consents were signed. All questions were answered. She was taken to the operating room and general anesthesia was applied without complication. She was placed in the dorsal lithotomy position and her abdomen and vagina were prepped and draped after she had been carefully positioned on the table. A bimanual exam revealed a 16-18 week sized uterus that was mobile. Her adnexa were not able to be appreciated from bimanual exam. The cervix was measured and the uterus was sounded to >13 cm. A Rumi uterine manipulator was placed without difficulty. A Foley catheter was placed and it drained clear throughout the case. Gloves were changed and attention was  turned to the abdomen. A 37mm incision was made 10 cm above the umbilicus and trocar was placed intraperitoneally. CO2 was used to insufflate the abdomen to approximately 4 L. After good pneumoperitoneum was established 2 50mm trocars were placed under direct visualization after she was placed in Trendelenburg position.  There was also a 89mm assistant port placed in the left upper quadrant under direct laproscopic visualization. After confirming adequate placement of the ports, the robot was docked and I proceeded with a robotic portion of the case.  The pelvis was inspected and the uterus was found to have fibroids and be enlarged as stated above. The left fallopian tube had apparently been surgically removed in the past. The left ovary was normal.  There were adhesions of omentum to the anterior abd wall.  The right fallopian tube and ovary was found to be normal in appearance.  The ureters and the infundibulopelvic ligaments were identified. The round ligament on each side was cauterized and cut. The PK/gyrus instrument was used for this portion. The round ligaments were identified, cauterized and ligated, a bladder flap was created anteriorly. The broad ligament was serially clamped and ligated again using the PK/gyrus.  The uterine vessels were identified and cauterized and then cut. The uterus was large and was of a soft consistency which made it difficult to move with just the RUMI, This added increased time to the case to get adequate visualization of the ligaments and pedicals. The bladder was pushed out of the operative site and an anterior colpotomy was made. The colpotomy incision was extended circumferentially, following the blue outline  of the [employer]. All pedicles were hemostatic.  I excised the fallopian tube on the right side. The uterus had to be morcellated from the vagina.  It took over 1 hour to remove the entire uterus from the vagina after it was detached from the ligaments.  After  morcellation the uterus was removed from the vagina. The vaginal cuff was then closed with v-lock suture.  Excellent hemostasis was noted throughout. The pelvis was irrigated. The intraabdominal pressure was lowered assess hemostasis. After determining excellent hemostasis, the robot was undocked. At this point I performed cystoscopy. The cystoscopy revealed blue ejection from both ureters.  The skin from all of the other ports was closed with Dermabond.  30cc of 0.5% Marcaine was injected into the port sites.  The patient was then extubated and taken to recovery in stable condition.   Sponge, lap and needle counts were correct x 2.    Lorraine Holt, M.D., Lorraine Holt

## 2014-09-17 NOTE — Transfer of Care (Signed)
Immediate Anesthesia Transfer of Care Note  Patient: Lorraine Holt  Procedure(s) Performed: Procedure(s): ROBOTIC ASSISTED TOTAL HYSTERECTOMY (N/A) RIGHT SALPINGECTOMY (Right)  Patient Location: PACU  Anesthesia Type:General  Level of Consciousness: awake, oriented and patient cooperative  Airway & Oxygen Therapy: Patient Spontanous Breathing and Patient connected to nasal cannula oxygen  Post-op Assessment: Report given to RN and Post -op Vital signs reviewed and stable  Post vital signs: Reviewed and stable  Last Vitals:  Filed Vitals:   09/17/14 0626  BP: 133/87  Pulse: 100  Temp: 36.6 C  Resp: 20    Complications: No apparent anesthesia complications

## 2014-09-17 NOTE — Brief Op Note (Signed)
09/17/2014  11:19 AM  PATIENT:  Lorraine Holt  49 y.o. female  PRE-OPERATIVE DIAGNOSIS:  Abnormal uterine bleeding  POST-OPERATIVE DIAGNOSIS:  Abnormal uterine bleeding  PROCEDURE:  Procedure(s): ROBOTIC ASSISTED TOTAL HYSTERECTOMY (N/A) RIGHT SALPINGECTOMY (Right)  SURGEON:  Surgeon(s) and Role:    * Lavonia Drafts, MD - Primary    * Osborne Oman, MD  ANESTHESIA:   general  EBL:  Total I/O In: 1800 [I.V.:1800] Out: 600 [Urine:100; Other:50; Blood:450]  BLOOD ADMINISTERED:none  DRAINS: none   LOCAL MEDICATIONS USED:  MARCAINE     SPECIMEN:  Source of Specimen:  uterus, cervix and right fallopian tube  DISPOSITION OF SPECIMEN:  PATHOLOGY  COUNTS:  YES  TOURNIQUET:  * No tourniquets in log *  DICTATION: .Note written in EPIC  PLAN OF CARE: Admit for overnight observation  PATIENT DISPOSITION:  PACU - hemodynamically stable.   Delay start of Pharmacological VTE agent (>24hrs) due to surgical blood loss or risk of bleeding: yes  Complications: none immediate

## 2014-09-17 NOTE — Anesthesia Postprocedure Evaluation (Signed)
Anesthesia Post Note  Patient: Lorraine Holt  Procedure(s) Performed: Procedure(s) (LRB): ROBOTIC ASSISTED TOTAL HYSTERECTOMY (N/A) RIGHT SALPINGECTOMY (Right)  Anesthesia type: General  Patient location: PACU  Post pain: Pain level controlled  Post assessment: Post-op Vital signs reviewed  Last Vitals:  Filed Vitals:   09/17/14 1305  BP: 118/75  Pulse: 83  Temp: 36.3 C  Resp: 18    Post vital signs: Reviewed  Level of consciousness: sedated  Complications: No apparent anesthesia complications

## 2014-09-18 ENCOUNTER — Encounter (HOSPITAL_COMMUNITY): Payer: Self-pay | Admitting: Obstetrics & Gynecology

## 2014-09-18 DIAGNOSIS — D259 Leiomyoma of uterus, unspecified: Secondary | ICD-10-CM | POA: Diagnosis not present

## 2014-09-18 LAB — CBC
HCT: 36.9 % (ref 36.0–46.0)
HEMOGLOBIN: 11.7 g/dL — AB (ref 12.0–15.0)
MCH: 27.7 pg (ref 26.0–34.0)
MCHC: 31.7 g/dL (ref 30.0–36.0)
MCV: 87.4 fL (ref 78.0–100.0)
Platelets: 198 10*3/uL (ref 150–400)
RBC: 4.22 MIL/uL (ref 3.87–5.11)
RDW: 15 % (ref 11.5–15.5)
WBC: 12 10*3/uL — ABNORMAL HIGH (ref 4.0–10.5)

## 2014-09-18 LAB — BASIC METABOLIC PANEL
Anion gap: 5 (ref 5–15)
BUN: 15 mg/dL (ref 6–23)
CALCIUM: 8.2 mg/dL — AB (ref 8.4–10.5)
CO2: 27 mmol/L (ref 19–32)
Chloride: 104 mmol/L (ref 96–112)
Creatinine, Ser: 0.68 mg/dL (ref 0.50–1.10)
GFR calc Af Amer: >90 mL/min (ref >90)
Glucose, Bld: 120 mg/dL — ABNORMAL HIGH (ref 70–99)
Potassium: 3.8 mmol/L (ref 3.5–5.1)
Sodium: 136 mmol/L (ref 135–145)

## 2014-09-18 LAB — GLUCOSE, CAPILLARY
GLUCOSE-CAPILLARY: 157 mg/dL — AB (ref 70–99)
GLUCOSE-CAPILLARY: 115 mg/dL — AB (ref 70–99)

## 2014-09-18 MED ORDER — LINACLOTIDE 145 MCG PO CAPS
290.0000 ug | ORAL_CAPSULE | Freq: Every day | ORAL | Status: DC
  Administered 2014-09-18: 290 ug via ORAL
  Filled 2014-09-18 (×2): qty 2

## 2014-09-18 MED ORDER — IBUPROFEN 600 MG PO TABS: 600.0000 mg | ORAL_TABLET | Freq: Four times a day (QID) | ORAL | Status: DC | PRN

## 2014-09-18 MED ORDER — OXYCODONE-ACETAMINOPHEN 5-325 MG PO TABS: 1.0000 | ORAL_TABLET | ORAL | Status: DC | PRN

## 2014-09-18 MED ORDER — BISACODYL 10 MG RE SUPP
10.0000 mg | Freq: Once | RECTAL | Status: AC
  Administered 2014-09-18: 10 mg via RECTAL

## 2014-09-18 NOTE — Discharge Instructions (Signed)
Total Laparoscopic Hysterectomy, Care After °Refer to this sheet in the next few weeks. These instructions provide you with information on caring for yourself after your procedure. Your health care provider may also give you more specific instructions. Your treatment has been planned according to current medical practices, but problems sometimes occur. Call your health care provider if you have any problems or questions after your procedure. °WHAT TO EXPECT AFTER THE PROCEDURE °· Pain and bruising at the incision sites. You will be given pain medicine to control it. °· Menopausal symptoms such as hot flashes, night sweats, and insomnia if your ovaries were removed. °· Sore throat from the breathing tube that was inserted during surgery. °HOME CARE INSTRUCTIONS °· Only take over-the-counter or prescription medicines for pain, discomfort, or fever as directed by your health care provider.   °· Do not take aspirin. It can cause bleeding.   °· Do not drive when taking pain medicine.   °· Follow your health care provider's advice regarding diet, exercise, lifting, driving, and general activities.   °· Resume your usual diet as directed and allowed.   °· Get plenty of rest and sleep.   °· Do not douche, use tampons, or have sexual intercourse for at least 6 weeks, or until your health care provider gives you permission.   °· Change your bandages (dressings) as directed by your health care provider.   °· Monitor your temperature and notify your health care provider of a fever.   °· Take showers instead of baths for 2-3 weeks.   °· Do not drink alcohol until your health care provider gives you permission.   °· If you develop constipation, you may take a mild laxative with your health care provider's permission. Bran foods may help with constipation problems. Drinking enough fluids to keep your urine clear or pale yellow may help as well.   °· Try to have someone home with you for 1-2 weeks to help around the house.    °· Keep all of your follow-up appointments as directed by your health care provider.   °SEEK MEDICAL CARE IF: °· You have swelling, redness, or increasing pain around your incision sites.   °· You have pus coming from your incision.   °· You notice a bad smell coming from your incision.   °· Your incision breaks open.   °· You feel dizzy or lightheaded.   °· You have pain or bleeding when you urinate.   °· You have persistent diarrhea.   °· You have persistent nausea and vomiting.   °· You have abnormal vaginal discharge.   °· You have a rash.   °· You have any type of abnormal reaction or develop an allergy to your medicine.   °· You have poor pain control with your prescribed medicine.   °SEEK IMMEDIATE MEDICAL CARE IF: °· You have chest pain or shortness of breath. °· You have severe abdominal pain that is not relieved with pain medicine. °· You have pain or swelling in your legs. °MAKE SURE YOU: °· Understand these instructions. °· Will watch your condition. °· Will get help right away if you are not doing well or get worse. °Document Released: 03/14/2013 Document Revised: 05/29/2013 Document Reviewed: 03/14/2013 °Exit[employer]® Patient Information ©2015 [employer]. This information is not intended to replace advice given to you by your health care provider. Make sure you discuss any questions you have with your health care provider. ° °

## 2014-09-18 NOTE — Progress Notes (Signed)
Pt ambulated out teaching complete  

## 2014-09-20 ENCOUNTER — Encounter: Payer: Self-pay | Admitting: *Deleted

## 2014-09-20 NOTE — Progress Notes (Signed)
Disability completed/faxed

## 2014-09-20 NOTE — Discharge Summary (Signed)
Physician Discharge Summary  Patient ID: Lorraine Holt MRN: [id number] DOB/AGE: 07/08/1965 49 y.o.  Admit date: 09/17/2014 Discharge date: 09/20/2014  Admission Diagnoses: fibroids, AUB  Discharge Diagnoses:  Principal Problem:   Fibroids Active Problems:   Abnormal uterine bleeding   Severe obesity (BMI >= 40)   Fibroid, uterine   Post-operative state   Discharged Condition: good  Hospital Course: Pt was admitted  For a RATH that was uncomplicated.  She was able to eat and ambulate on POD #1.  She had her foley cath removed on POD#0.  She reports adequate pain relief with Percocet and Motrin.  She reports that she feels ready for discharge and there are no contraindications to her discharge.      Consults: None  Significant Diagnostic Studies: labs: CBC    Treatments: surgery: Robot assisted total laparoscopic hysterectomy with right salpingectomy  Discharge Exam: Blood pressure 113/68, pulse 80, temperature 98.2 F (36.8 C), temperature source Oral, resp. rate 18, height 5' 10.5" (1.791 m), weight 315 lb 6 oz (143.053 kg), SpO2 97 %. General appearance: alert and no distress Resp: clear to auscultation bilaterally Cardio: regular rate and rhythm, S1, S2 normal, no murmur, click, rub or gallop GI: soft, non-tender; bowel sounds normal; no masses,  no organomegaly and obese Extremities: extremities normal, atraumatic, no cyanosis or edema Incision/Wound:port sites covered with dressing but, dry  CBC    Component Value Date/Time   WBC 12.0* 09/18/2014 0535   RBC 4.22 09/18/2014 0535   HGB 11.7* 09/18/2014 0535   HCT 36.9 09/18/2014 0535   PLT 198 09/18/2014 0535   MCV 87.4 09/18/2014 0535   MCH 27.7 09/18/2014 0535   MCHC 31.7 09/18/2014 0535   RDW 15.0 09/18/2014 0535   LYMPHSABS 2.7 08/27/2014 0913   MONOABS 0.4 08/27/2014 0913   EOSABS 0.2 08/27/2014 0913   BASOSABS 0.0 08/27/2014 0913      Disposition: 01-Home or Self Care  Discharge Instructions    Call  MD for:  difficulty breathing, headache or visual disturbances    Complete by:  As directed      Call MD for:  extreme fatigue    Complete by:  As directed      Call MD for:  hives    Complete by:  As directed      Call MD for:  persistant dizziness or light-headedness    Complete by:  As directed      Call MD for:  persistant nausea and vomiting    Complete by:  As directed      Call MD for:  redness, tenderness, or signs of infection (pain, swelling, redness, odor or green/yellow discharge around incision site)    Complete by:  As directed      Call MD for:  severe uncontrolled pain    Complete by:  As directed      Call MD for:  temperature >100.4    Complete by:  As directed      Diet - low sodium heart healthy    Complete by:  As directed      Diet Carb Modified    Complete by:  As directed      Discharge wound care:    Complete by:  As directed   Keep port sites clean and dry     Increase activity slowly    Complete by:  As directed             Medication List    TAKE  these medications        glucose blood test strip  Test up to twice daily     hydrochlorothiazide 12.5 MG capsule  Commonly known as:  MICROZIDE  Take 1 capsule (12.5 mg total) by mouth daily.     ibuprofen 600 MG tablet  Commonly known as:  ADVIL,MOTRIN  Take 1 tablet (600 mg total) by mouth every 6 (six) hours as needed (mild pain).     Linaclotide 290 MCG Caps capsule  Commonly known as:  LINZESS  Take 290 mcg by mouth daily.     losartan 100 MG tablet  Commonly known as:  COZAAR  Take 1 tablet (100 mg total) by mouth daily.     metFORMIN 500 MG tablet  Commonly known as:  GLUCOPHAGE  Take 1 tablet (500 mg total) by mouth 2 (two) times daily with a meal.     oxyCODONE-acetaminophen 5-325 MG per tablet  Commonly known as:  PERCOCET/ROXICET  Take 1-2 tablets by mouth every 4 (four) hours as needed (moderate to severe pain (when tolerating fluids)).     pravastatin 10 MG tablet   Commonly known as:  PRAVACHOL  Take 1 tablet (10 mg total) by mouth daily.     TRUEPLUS LANCETS 30G Misc  USE TWICE DAILY           Follow-up Information    Follow up with HARRAWAY-SMITH, Melisssa Donner, MD In 2 weeks.   Specialty:  Obstetrics and Gynecology   Contact information:   [place] [address] [phone]       Signed: Lavonia Drafts 09/20/2014, 10:20 AM

## 2014-09-30 DIAGNOSIS — K047 Periapical abscess without sinus: Secondary | ICD-10-CM

## 2014-09-30 HISTORY — PX: TOOTH EXTRACTION: SUR596

## 2014-09-30 HISTORY — DX: Periapical abscess without sinus: K04.7

## 2014-10-01 ENCOUNTER — Other Ambulatory Visit: Payer: Self-pay | Admitting: Internal Medicine

## 2014-10-01 ENCOUNTER — Encounter: Payer: Self-pay | Admitting: Internal Medicine

## 2014-10-01 DIAGNOSIS — R928 Other abnormal and inconclusive findings on diagnostic imaging of breast: Secondary | ICD-10-CM

## 2014-10-02 ENCOUNTER — Ambulatory Visit (INDEPENDENT_AMBULATORY_CARE_PROVIDER_SITE_OTHER): Payer: 59 | Admitting: Obstetrics & Gynecology

## 2014-10-02 ENCOUNTER — Encounter: Payer: Self-pay | Admitting: Obstetrics & Gynecology

## 2014-10-02 VITALS — BP 129/86 | HR 93 | Temp 98.3°F | Resp 20 | Ht 67.5 in | Wt 311.6 lb

## 2014-10-02 DIAGNOSIS — Z9889 Other specified postprocedural states: Secondary | ICD-10-CM

## 2014-10-02 NOTE — Patient Instructions (Signed)
Total Laparoscopic Hysterectomy, Care After °Refer to this sheet in the next few weeks. These instructions provide you with information on caring for yourself after your procedure. Your health care provider may also give you more specific instructions. Your treatment has been planned according to current medical practices, but problems sometimes occur. Call your health care provider if you have any problems or questions after your procedure. °WHAT TO EXPECT AFTER THE PROCEDURE °· Pain and bruising at the incision sites. You will be given pain medicine to control it. °· Menopausal symptoms such as hot flashes, night sweats, and insomnia if your ovaries were removed. °· Sore throat from the breathing tube that was inserted during surgery. °HOME CARE INSTRUCTIONS °· Only take over-the-counter or prescription medicines for pain, discomfort, or fever as directed by your health care provider.   °· Do not take aspirin. It can cause bleeding.   °· Do not drive when taking pain medicine.   °· Follow your health care provider's advice regarding diet, exercise, lifting, driving, and general activities.   °· Resume your usual diet as directed and allowed.   °· Get plenty of rest and sleep.   °· Do not douche, use tampons, or have sexual intercourse for at least 6 weeks, or until your health care provider gives you permission.   °· Change your bandages (dressings) as directed by your health care provider.   °· Monitor your temperature and notify your health care provider of a fever.   °· Take showers instead of baths for 2-3 weeks.   °· Do not drink alcohol until your health care provider gives you permission.   °· If you develop constipation, you may take a mild laxative with your health care provider's permission. Bran foods may help with constipation problems. Drinking enough fluids to keep your urine clear or pale yellow may help as well.   °· Try to have someone home with you for 1-2 weeks to help around the house.    °· Keep all of your follow-up appointments as directed by your health care provider.   °SEEK MEDICAL CARE IF: °· You have swelling, redness, or increasing pain around your incision sites.   °· You have pus coming from your incision.   °· You notice a bad smell coming from your incision.   °· Your incision breaks open.   °· You feel dizzy or lightheaded.   °· You have pain or bleeding when you urinate.   °· You have persistent diarrhea.   °· You have persistent nausea and vomiting.   °· You have abnormal vaginal discharge.   °· You have a rash.   °· You have any type of abnormal reaction or develop an allergy to your medicine.   °· You have poor pain control with your prescribed medicine.   °SEEK IMMEDIATE MEDICAL CARE IF: °· You have chest pain or shortness of breath. °· You have severe abdominal pain that is not relieved with pain medicine. °· You have pain or swelling in your legs. °MAKE SURE YOU: °· Understand these instructions. °· Will watch your condition. °· Will get help right away if you are not doing well or get worse. °Document Released: 03/14/2013 Document Revised: 05/29/2013 Document Reviewed: 03/14/2013 °ExitCare® Patient Information ©2015 [employer]. This information is not intended to replace advice given to you by your health care provider. Make sure you discuss any questions you have with your health care provider. ° °

## 2014-10-02 NOTE — Progress Notes (Signed)
Subjective:     Patient ID: Lorraine Holt, female   DOB: [birth year], 49 y.o.   MRN: [id number]  HPI Pt presents today for routine post op check.  She denies any problems.  She does reports tha tshortly after surgery she has a tooth abscess that the dentist managed and evaluated.  She denies fever or chills.  She reports normal bowel and bladder function. She is eating and sleeping without difficulty. She denies hot flushes.    Review of Systems     Objective:   Physical Exam BP 129/86 mmHg  Pulse 93  Temp(Src) 98.3 F (36.8 C) (Oral)  Resp 20  Ht 5' 7.5" (1.715 m)  Wt 311 lb 9.6 oz (141.341 kg)  BMI 48.06 kg/m2  LMP 08/08/2014 Pt in NAD Abd; obese, NT, ND.  Port sites healing well     09/17/2014 Diagnosis Uterus and cervix, with right fallopian tube - UTERUS: -ENDOMETRIUM: WEAKLY PROLIFERATIVE ENDOMETRIUM. NO HYPERPLASIA OR MALIGNANCY. -MYOMETRIUM: LEIOMYOMATA. NO MALIGNANCY. -SEROSA: UNREMARKABLE. NO ENDOMETRIOSIS OR MALIGNANCY. - CERVIX: BENIGN SQUAMOUS AND ENDOCERVICAL MUCOSA. NO DYSPLASIA OR MALIGNANCY. - RIGHT FALLOPIAN TUBE: PARATUBAL CYSTS. NO ENDOMETRIOSIS OR MALIGNANCY. Assessment:     2 week post op check from [facility] with bilateral salpingectomy doing well.  No complications noted  I reviewed her surg path with her   Plan:     F/u in 4 weeks Gradual increase in activities.  NO intercourse for a full 8 weeks RTW in 6 weeks

## 2014-10-03 ENCOUNTER — Telehealth: Payer: Self-pay | Admitting: *Deleted

## 2014-10-03 NOTE — Telephone Encounter (Signed)
Lorraine Holt called and left a message that she came in yesterday for her 2 week checkup after hysterectomy. States she forgot to ask Dr. Ihor Dow if it was " natural" that when she urinates and then wipes that she sees a " real light streak of blood".

## 2014-10-07 LAB — HM MAMMOGRAPHY: HM Mammogram: NORMAL

## 2014-10-07 NOTE — Telephone Encounter (Signed)
Called patient stating I am returning your call. Patient states she forgot to mention to the doctor the other day, but when she wipes the first time after urinating she sees a very small amount of pinkish tinged d/c and didn't know if that was okay or not. Asked patient if she was having any difficulties urinating. Patient states no. Told patient that sounds okay and that can sometimes happen as you are healing after surgery so long as she isn't having heavy bleeding. Patient verbalized understanding and had no other questions

## 2014-10-14 ENCOUNTER — Encounter: Payer: Self-pay | Admitting: Internal Medicine

## 2014-10-28 ENCOUNTER — Ambulatory Visit (INDEPENDENT_AMBULATORY_CARE_PROVIDER_SITE_OTHER): Payer: 59 | Admitting: Obstetrics & Gynecology

## 2014-10-28 ENCOUNTER — Encounter: Payer: Self-pay | Admitting: Obstetrics & Gynecology

## 2014-10-28 VITALS — BP 131/92 | HR 99 | Temp 98.3°F | Wt 316.2 lb

## 2014-10-28 DIAGNOSIS — Z9889 Other specified postprocedural states: Secondary | ICD-10-CM

## 2014-10-28 NOTE — Patient Instructions (Signed)

## 2014-10-28 NOTE — Progress Notes (Signed)
Subjective:     Patient ID: Lorraine Holt, female   DOB: 1965/10/10, 49 y.o.   MRN: [id number]  HPI Pt presents for 6 weeks post op check.  She reports that she is doing well. She is still smoking and says that she is trying to get into a smoking cessation program with her primary care physician.  She has good pain control.  She is voiding and passing stools without difficulty.  She denies being sexually active since surgery.       Review of Systems     Objective:   Physical Exam BP 131/92 mmHg  Pulse 99  Temp(Src) 98.3 F (36.8 C) (Oral)  Wt 316 lb 3.2 oz (143.427 kg)  LMP 08/08/2014 Pt in NAD Abd; obese, NT, ND GU: EGBUS: no lesions Vagina: no blood in vault; cuff healing well.  Suture noted at the top of the cuff. Adnexa: no masses; nono tender   09/17/2014 Diagnosis Uterus and cervix, with right fallopian tube - UTERUS: -ENDOMETRIUM: WEAKLY PROLIFERATIVE ENDOMETRIUM. NO HYPERPLASIA OR MALIGNANCY. -MYOMETRIUM: LEIOMYOMATA. NO MALIGNANCY. -SEROSA: UNREMARKABLE. NO ENDOMETRIOSIS OR MALIGNANCY. - CERVIX: BENIGN SQUAMOUS AND ENDOCERVICAL MUCOSA. NO DYSPLASIA OR MALIGNANCY. - RIGHT FALLOPIAN TUBE: PARATUBAL CYSTS. NO ENDOMETRIOSIS OR MALIGNANCY.       Assessment:     6 week post op check after [facility] with unilateral salpingectomy- pt doing well       Plan:     F/u in 3 months or sooner prn problems. Return to full activities except intercourse- wait 2 weeks for intercourse

## 2014-10-30 ENCOUNTER — Ambulatory Visit: Payer: 59 | Admitting: Obstetrics & Gynecology

## 2014-11-25 ENCOUNTER — Other Ambulatory Visit: Payer: Self-pay | Admitting: Internal Medicine

## 2014-11-25 ENCOUNTER — Ambulatory Visit: Payer: Self-pay

## 2014-11-26 ENCOUNTER — Other Ambulatory Visit: Payer: Self-pay | Admitting: Internal Medicine

## 2014-12-23 ENCOUNTER — Telehealth: Payer: Self-pay

## 2015-01-02 ENCOUNTER — Ambulatory Visit: Payer: 59

## 2015-01-27 ENCOUNTER — Other Ambulatory Visit: Payer: Self-pay

## 2015-01-27 VITALS — BP 138/96 | HR 86 | Resp 16 | Ht 68.0 in | Wt 324.0 lb

## 2015-01-27 DIAGNOSIS — E119 Type 2 diabetes mellitus without complications: Secondary | ICD-10-CM

## 2015-01-27 LAB — POCT GLYCOSYLATED HEMOGLOBIN (HGB A1C): Hemoglobin A1C: 7.1

## 2015-01-27 NOTE — Patient Outreach (Signed)
[facility]) Care Management   01/27/2015  Lorraine Holt 01/07/1966 [id number]  Lorraine Holt is an 49 y.o. female.   Member seen for follow up office visit for Link to Wellness program for self management of Type 2 diabetes  Subjective: Member states that she has gained weight since she had her hysterectomy as she was not active after her surgery.  States she wants to go back to following a gluten free diet as she felt better and lost weight on this diet.  States she has been trying to get more exercise now and she is going to ride the exercise bike at work on her lunch break.  States that her husband has a diagnosis of diabetes now, so she is trying to get him to watch his diet with her.    Objective:   Review of Systems  Musculoskeletal: Positive for joint pain.  All other systems reviewed and are negative. Reviewed glucometer 7 day average-117 14 day average-113 30 day average-118 POCT Hemoglobin A1C 7.1  Physical Exam Today's Vitals   01/27/15 0915 01/27/15 0924  Pulse: 86   Resp: 16   Height: 1.727 m (5\' 8" )   Weight: 324 lb (146.965 kg)   SpO2: 96%   PainSc: 2  2    Current Medications:   Current Outpatient Prescriptions  Medication Sig Dispense Refill  . hydrochlorothiazide (MICROZIDE) 12.5 MG capsule TAKE 1 CAPSULE BY MOUTH ONCE DAILY 30 capsule 11  . ibuprofen (ADVIL,MOTRIN) 600 MG tablet Take 1 tablet (600 mg total) by mouth every 6 (six) hours as needed (mild pain). 30 tablet 0  . Linaclotide (LINZESS) 290 MCG CAPS capsule Take 290 mcg by mouth daily.    Marland Kitchen losartan (COZAAR) 100 MG tablet TAKE 1 TABLET BY MOUTH ONCE DAILY 30 tablet 11  . metFORMIN (GLUCOPHAGE) 500 MG tablet TAKE 1 TABLET BY MOUTH TWICE DAILY WITH MEALS 60 tablet 11  . pravastatin (PRAVACHOL) 10 MG tablet TAKE 1 TABLET BY MOUTH ONCE DAILY 30 tablet 11  . TRUEPLUS LANCETS 30G MISC USE TWICE DAILY 100 each 11  . TRUETEST TEST test strip TEST UP TO TWICE DAILY 100 each 12   No current  facility-administered medications for this visit.    Functional Status:   In your present state of health, do you have any difficulty performing the following activities: 01/27/2015 09/17/2014  Hearing? N N  Vision? N N  Difficulty concentrating or making decisions? N N  Walking or climbing stairs? N N  Dressing or bathing? N N  Doing errands, shopping? N -    Fall/Depression Screening:    PHQ 2/9 Scores 01/27/2015 08/15/2014 01/07/2014  PHQ - 2 Score 0 0 0   THN CM Care Plan Problem One        Patient Outreach from 01/27/2015 in [place] Problem One  Potential for elevated blood sugars related to dx of Type 2 DM   Care Plan for Problem One  Active   THN Long Term Goal (31-90 days)  Member will have hemoglobin A1C of 7 or below for the next 90 days   THN Long Term Goal Start Date  01/27/15   Interventions for Problem One Long Term Goal  Reviewed CHO counting and portion control, Encouraged to eat more protein with meals and at snacks,  Discussed weight loss and how that can help decrease blood sugars and B/P, Assigned EMMI on stroke prevention and Eye exams.  Reviewed how to  access EMMI programs, Reinforced importance of regular exercise to glycemic control      Assessment:   Member seen for follow up office visit for Link to Wellness program for self management of Type 2 diabetes.  Member's hemoglobin A1C has increased to 7.1.  Member reports she had been less active after her surgery.  Weight has increased 11 lbs since last Link to Wellness visit.  She reports trying to limit CHO but diet lower in protein.  Plan:   Plan to check blood sugar 3-4 times a week at different times.  Goals of 80-130  before eating and less than 180 1  -2 hours after eating Plan to follow  gluten free diet  and eat more protein Plan to walk 1 day a week for 15-20 minutes and exercise at 3 times a week for 20 minutes.  Try to work up to 150 minutes a week Plan to complete EMMI  programs by 04/08/15 Plan to return to Link to Wellness in 05/05/15   Peter Garter RN, [facility] Care Management Coordinator-Link to [place] Management [phone]

## 2015-01-27 NOTE — Patient Instructions (Signed)
1. Plan to check blood sugar 3-4 times a week at different times.  Goals of 80-130  before eating and less than 180 1  -2 hours after eating 2. Plan to follow  gluten free diet  and eat more protein 3. Plan to walk 1 day a week for 15-20 minutes and exercise at 3 times a week for 20 minutes.  Try to work up to 150 minutes a week 4. Plan to complete EMMI programs by 04/08/15 5. Plan to return to Link to Wellness in 05/05/15 at 8:30AM

## 2015-02-27 ENCOUNTER — Other Ambulatory Visit: Payer: Self-pay

## 2015-02-27 MED ORDER — GLUCOSE BLOOD VI STRP: ORAL_STRIP | Status: DC

## 2015-04-10 ENCOUNTER — Encounter: Payer: Self-pay | Admitting: *Deleted

## 2015-05-05 ENCOUNTER — Other Ambulatory Visit: Payer: Self-pay

## 2015-05-05 VITALS — BP 136/94 | HR 88 | Resp 16 | Ht 68.0 in | Wt 324.8 lb

## 2015-05-05 DIAGNOSIS — E119 Type 2 diabetes mellitus without complications: Secondary | ICD-10-CM

## 2015-05-05 LAB — POCT GLYCOSYLATED HEMOGLOBIN (HGB A1C): Hemoglobin A1C: 7.2

## 2015-05-05 NOTE — Patient Instructions (Signed)
1. Plan to check blood sugar 3-4 times a week at different times.  Goals of 80-130  before eating and less than 180 1  -2 hours after eating 2. Plan to limit portions sizes of rice and eat more protein 3. Plan to get Fit Bit for Christmas.  Try to get over 5000 steps a day 4. Plan to complete EMMI programs by 07/09/15 5. Plan to schedule eye exam 6. Plan to return to Link to Wellness in 08/04/15 at 8:45AM

## 2015-05-05 NOTE — Patient Outreach (Signed)
[facility]) Care Management   05/05/2015  Lorraine Holt 06-07-1966 [id number]  Lorraine Holt is an 49 y.o. female.   Member seen for follow up office visit for Link to Wellness program for self management of Type 2 diabetes  Subjective:  Member states that she has not been exercising regularly other than walking at work.  States she is trying to cook more at home and is eating out less now.  States she did eat more over the Thanksgiving holiday.  States she is having difficulty watching her portions of rice at dinner.   Objective:   Review of Systems  All other systems reviewed and are negative. POC hemoglobin A1C- 7.2  Physical Exam  Today's Vitals   05/05/15 0837  BP: 136/94  Pulse: 88  Resp: 16  Height: 1.727 m (5\' 8" )  Weight: 324 lb 12.8 oz (147.328 kg)  SpO2: 98%  PainSc: 0-No pain    Current Medications:   Current Outpatient Prescriptions  Medication Sig Dispense Refill  . glucose blood test strip Test up to twice daily 100 each 12  . hydrochlorothiazide (MICROZIDE) 12.5 MG capsule TAKE 1 CAPSULE BY MOUTH ONCE DAILY 30 capsule 11  . Linaclotide (LINZESS) 290 MCG CAPS capsule Take 290 mcg by mouth daily.    Marland Kitchen losartan (COZAAR) 100 MG tablet TAKE 1 TABLET BY MOUTH ONCE DAILY 30 tablet 11  . metFORMIN (GLUCOPHAGE) 500 MG tablet TAKE 1 TABLET BY MOUTH TWICE DAILY WITH MEALS 60 tablet 11  . pravastatin (PRAVACHOL) 10 MG tablet TAKE 1 TABLET BY MOUTH ONCE DAILY 30 tablet 11  . TRUEPLUS LANCETS 30G MISC USE TWICE DAILY 100 each 11  . ibuprofen (ADVIL,MOTRIN) 600 MG tablet Take 1 tablet (600 mg total) by mouth every 6 (six) hours as needed (mild pain). (Patient not taking: Reported on 05/05/2015) 30 tablet 0   No current facility-administered medications for this visit.    Functional Status:   In your present state of health, do you have any difficulty performing the following activities: 05/05/2015 01/27/2015  Hearing? N N  Vision? N N  Difficulty  concentrating or making decisions? N N  Walking or climbing stairs? N N  Dressing or bathing? N N  Doing errands, shopping? N N    Fall/Depression Screening:    PHQ 2/9 Scores 05/05/2015 01/27/2015 08/15/2014 01/07/2014  PHQ - 2 Score 0 0 0 0    Assessment:   Member seen for follow up office visit for Link to Wellness program for self management of Type 2 diabetes.  Member is slightly over diabetes self management goal of hemoglobin A1C of 7 with reading of 7.2 today.  Member has not been exercising regularly.  Member is checking blood sugars in AM 2-3 times a week which average 88-157.  Member is overdue for annual eye exam.  Plan:  Plan to check blood sugar 3-4 times a week at different times.  Goals of 80-130  before eating and less than 180 1  -2 hours after eating Plan to limit portions sizes of rice and eat more protein Plan to get Fit Bit for Christmas.  Try to get over 5000 steps a day Plan to complete EMMI programs by 07/09/15 Plan to schedule eye exam Plan to return to Link to Wellness in 08/04/15 at 8:45AM  [facility] CM Care Plan Problem One        Most Recent Value   Care Plan Problem One  Potential for elevated blood sugars related to  dx of Type 2 DM   Role Documenting the Problem One  Care Management Coordinator   Care Plan for Problem One  Active   THN Long Term Goal (31-90 days)  Member will have hemoglobin A1C of 7 or below for the next 90 days   THN Long Term Goal Start Date  05/05/15 [Members hemoglobin A1C was 7.2 ]   Interventions for Problem One Long Term Goal  Reviewed CHO counting and portion control, Encouraged to eat more protein with meals and at snacks, Instructed to watch portion sizes of rice with meals, Reinforced importance of  weight loss Reinforced importance of regular exercise to glycemic control    Peter Garter RN, [facility] Care Management Coordinator-Link to [place] Management [phone]

## 2015-05-22 ENCOUNTER — Encounter: Payer: Self-pay | Admitting: Internal Medicine

## 2015-05-22 ENCOUNTER — Other Ambulatory Visit (INDEPENDENT_AMBULATORY_CARE_PROVIDER_SITE_OTHER): Payer: 59

## 2015-05-22 ENCOUNTER — Telehealth: Payer: Self-pay | Admitting: *Deleted

## 2015-05-22 ENCOUNTER — Other Ambulatory Visit: Payer: Self-pay | Admitting: Internal Medicine

## 2015-05-22 ENCOUNTER — Ambulatory Visit (INDEPENDENT_AMBULATORY_CARE_PROVIDER_SITE_OTHER): Payer: 59 | Admitting: Internal Medicine

## 2015-05-22 VITALS — BP 142/90 | HR 82 | Temp 98.1°F | Resp 16 | Ht 68.0 in | Wt 330.0 lb

## 2015-05-22 DIAGNOSIS — E785 Hyperlipidemia, unspecified: Secondary | ICD-10-CM

## 2015-05-22 DIAGNOSIS — E118 Type 2 diabetes mellitus with unspecified complications: Secondary | ICD-10-CM

## 2015-05-22 DIAGNOSIS — I10 Essential (primary) hypertension: Secondary | ICD-10-CM

## 2015-05-22 DIAGNOSIS — R928 Other abnormal and inconclusive findings on diagnostic imaging of breast: Secondary | ICD-10-CM

## 2015-05-22 DIAGNOSIS — D510 Vitamin B12 deficiency anemia due to intrinsic factor deficiency: Secondary | ICD-10-CM

## 2015-05-22 DIAGNOSIS — D509 Iron deficiency anemia, unspecified: Secondary | ICD-10-CM

## 2015-05-22 DIAGNOSIS — Z1211 Encounter for screening for malignant neoplasm of colon: Secondary | ICD-10-CM

## 2015-05-22 LAB — COMPREHENSIVE METABOLIC PANEL
ALBUMIN: 3.5 g/dL (ref 3.5–5.2)
ALK PHOS: 120 U/L — AB (ref 39–117)
ALT: 13 U/L (ref 0–35)
AST: 14 U/L (ref 0–37)
BILIRUBIN TOTAL: 0.5 mg/dL (ref 0.2–1.2)
BUN: 9 mg/dL (ref 6–23)
CO2: 31 mEq/L (ref 19–32)
CREATININE: 0.75 mg/dL (ref 0.40–1.20)
Calcium: 8.9 mg/dL (ref 8.4–10.5)
Chloride: 102 mEq/L (ref 96–112)
GFR: 104.42 mL/min (ref >60.00)
GLUCOSE: 136 mg/dL — AB (ref 70–99)
Potassium: 3.8 mEq/L (ref 3.5–5.1)
Sodium: 137 mEq/L (ref 135–145)
TOTAL PROTEIN: 7.2 g/dL (ref 6.0–8.3)

## 2015-05-22 LAB — CBC WITH DIFFERENTIAL/PLATELET
BASOS ABS: 0 10*3/uL (ref 0.0–0.1)
Basophils Relative: 0.4 % (ref 0.0–3.0)
EOS PCT: 4.3 % (ref 0.0–5.0)
Eosinophils Absolute: 0.3 10*3/uL (ref 0.0–0.7)
HEMATOCRIT: 41.8 % (ref 36.0–46.0)
HEMOGLOBIN: 13.5 g/dL (ref 12.0–15.0)
LYMPHS ABS: 2.1 10*3/uL (ref 0.7–4.0)
LYMPHS PCT: 31.8 % (ref 12.0–46.0)
MCHC: 32.2 g/dL (ref 30.0–36.0)
MCV: 86.2 fl (ref 78.0–100.0)
MONOS PCT: 7.2 % (ref 3.0–12.0)
Monocytes Absolute: 0.5 10*3/uL (ref 0.1–1.0)
NEUTROS PCT: 56.3 % (ref 43.0–77.0)
Neutro Abs: 3.7 10*3/uL (ref 1.4–7.7)
Platelets: 254 10*3/uL (ref 150.0–400.0)
RBC: 4.85 Mil/uL (ref 3.87–5.11)
RDW: 15.4 % (ref 11.5–15.5)
WBC: 6.7 10*3/uL (ref 4.0–10.5)

## 2015-05-22 LAB — IBC PANEL
Iron: 34 ug/dL — ABNORMAL LOW (ref 42–145)
Saturation Ratios: 9.3 % — ABNORMAL LOW (ref 20.0–50.0)
Transferrin: 260 mg/dL (ref 212.0–360.0)

## 2015-05-22 LAB — LIPID PANEL
CHOLESTEROL: 150 mg/dL (ref 0–200)
HDL: 38.7 mg/dL — ABNORMAL LOW (ref >39.00)
LDL Cholesterol: 91 mg/dL (ref 0–99)
NONHDL: 110.82
Total CHOL/HDL Ratio: 4
Triglycerides: 100 mg/dL (ref 0.0–149.0)
VLDL: 20 mg/dL (ref 0.0–40.0)

## 2015-05-22 LAB — VITAMIN B12: Vitamin B-12: 1272 pg/mL — ABNORMAL HIGH (ref 211–911)

## 2015-05-22 LAB — FERRITIN: Ferritin: 33 ng/mL (ref 10.0–291.0)

## 2015-05-22 LAB — TSH: TSH: 4.24 u[IU]/mL (ref 0.35–4.50)

## 2015-05-22 LAB — FOLATE: FOLATE: 8.4 ng/mL (ref >5.9)

## 2015-05-22 MED ORDER — FERIVA 21/7 75-1 MG PO TABS: 1.0000 | ORAL_TABLET | Freq: Every day | ORAL | Status: DC

## 2015-05-22 MED ORDER — ASPIRIN EC 81 MG PO TBEC: 81.0000 mg | DELAYED_RELEASE_TABLET | Freq: Every day | ORAL | Status: DC

## 2015-05-22 NOTE — Telephone Encounter (Signed)
Called pt no answer LMOM with md response. Place card for pick-up...Lorraine Holt

## 2015-05-22 NOTE — Progress Notes (Signed)
Subjective:  Patient ID: Lorraine Holt, female    DOB: 10-06-1965  Age: 49 y.o. MRN: [id number]  CC: Hypertension and Anemia   HPI [name] presents for follow-up, she complains of fatigue and weight gain. She has not noticed any sources of blood loss. She did have bleeding fibroids and had a hysterectomy earlier this year.  Outpatient Prescriptions Prior to Visit  Medication Sig Dispense Refill  . glucose blood test strip Test up to twice daily 100 each 12  . hydrochlorothiazide (MICROZIDE) 12.5 MG capsule TAKE 1 CAPSULE BY MOUTH ONCE DAILY 30 capsule 11  . Linaclotide (LINZESS) 290 MCG CAPS capsule Take 290 mcg by mouth daily.    Marland Kitchen losartan (COZAAR) 100 MG tablet TAKE 1 TABLET BY MOUTH ONCE DAILY 30 tablet 11  . metFORMIN (GLUCOPHAGE) 500 MG tablet TAKE 1 TABLET BY MOUTH TWICE DAILY WITH MEALS 60 tablet 11  . pravastatin (PRAVACHOL) 10 MG tablet TAKE 1 TABLET BY MOUTH ONCE DAILY 30 tablet 11  . TRUEPLUS LANCETS 30G MISC USE TWICE DAILY 100 each 11  . ibuprofen (ADVIL,MOTRIN) 600 MG tablet Take 1 tablet (600 mg total) by mouth every 6 (six) hours as needed (mild pain). (Patient not taking: Reported on 05/05/2015) 30 tablet 0   No facility-administered medications prior to visit.    ROS Review of Systems  Constitutional: Positive for fatigue and unexpected weight change (wt gain). Negative for diaphoresis, activity change and appetite change.  HENT: Negative.  Negative for sore throat, trouble swallowing and voice change.   Eyes: Negative.   Respiratory: Negative.  Negative for cough, choking, chest tightness, shortness of breath and stridor.   Cardiovascular: Negative.  Negative for chest pain, palpitations and leg swelling.  Gastrointestinal: Negative.  Negative for nausea, vomiting, abdominal pain, diarrhea, constipation and blood in stool.  Endocrine: Negative.   Genitourinary: Negative.  Negative for hematuria and difficulty urinating.  Musculoskeletal: Negative.  Negative  for myalgias, back pain, arthralgias and neck pain.  Skin: Negative.  Negative for color change and rash.  Allergic/Immunologic: Negative.   Neurological: Negative.  Negative for dizziness, tremors, weakness, light-headedness, numbness and headaches.  Hematological: Negative.  Negative for adenopathy.  Psychiatric/Behavioral: Negative.     Objective:  BP 142/90 mmHg  Pulse 82  Temp(Src) 98.1 F (36.7 C) (Oral)  Resp 16  Ht 5\' 8"  (1.727 m)  Wt 330 lb (149.687 kg)  BMI 50.19 kg/m2  SpO2 96%  LMP 08/08/2014  BP Readings from Last 3 Encounters:  05/22/15 142/90  05/05/15 136/94  01/27/15 138/96    Wt Readings from Last 3 Encounters:  05/22/15 330 lb (149.687 kg)  05/05/15 324 lb 12.8 oz (147.328 kg)  01/27/15 324 lb (146.965 kg)    Physical Exam  Constitutional: She is oriented to person, place, and time. She appears well-developed and well-nourished. No distress.  HENT:  Mouth/Throat: Oropharynx is clear and moist. No oropharyngeal exudate.  Eyes: Conjunctivae are normal. Right eye exhibits no discharge. Left eye exhibits no discharge. No scleral icterus.  Neck: Normal range of motion. Neck supple. No JVD present. No tracheal deviation present. No thyromegaly present.  Cardiovascular: Normal rate, regular rhythm, normal heart sounds and intact distal pulses.  Exam reveals no gallop and no friction rub.   No murmur heard. Pulmonary/Chest: Effort normal and breath sounds normal. No stridor. No respiratory distress. She has no wheezes. She has no rales. She exhibits no tenderness.  Abdominal: Soft. Bowel sounds are normal. She exhibits no distension and  no mass. There is no tenderness. There is no rebound and no guarding.  Musculoskeletal: Normal range of motion. She exhibits no edema or tenderness.  Lymphadenopathy:    She has no cervical adenopathy.  Neurological: She is oriented to person, place, and time.  Skin: Skin is warm and dry. No rash noted. She is not diaphoretic.  No erythema. No pallor.    Lab Results  Component Value Date   WBC 6.7 05/22/2015   HGB 13.5 05/22/2015   HCT 41.8 05/22/2015   PLT 254.0 05/22/2015   GLUCOSE 136* 05/22/2015   CHOL 150 05/22/2015   TRIG 100.0 05/22/2015   HDL 38.70* 05/22/2015   LDLCALC 91 05/22/2015   ALT 13 05/22/2015   AST 14 05/22/2015   NA 137 05/22/2015   K 3.8 05/22/2015   CL 102 05/22/2015   CREATININE 0.75 05/22/2015   BUN 9 05/22/2015   CO2 31 05/22/2015   TSH 4.24 05/22/2015   HGBA1C 7.2 05/05/2015   MICROALBUR 1.5 06/26/2014    No results found.  Assessment & Plan:   Sherilyn was seen today for hypertension and anemia.  Diagnoses and all orders for this visit:  Essential hypertension, benign- her blood pressure is well-controlled, lites and renal function are stable. -     Comprehensive metabolic panel; Future  Type 2 diabetes mellitus with complication, without long-term current use of insulin ([place])- her recent A1c was 7.2%, she has adequate blood sugar control. She was referred to her annual eye exam, her renal function is stable. -     Comprehensive metabolic panel; Future -     aspirin EC 81 MG tablet; Take 1 tablet (81 mg total) by mouth daily. -     Ambulatory referral to Ophthalmology  Hyperlipidemia with target LDL less than 100- she has achieved her LDL goal is doing well on the statin. -     Lipid panel; Future -     TSH; Future -     Comprehensive metabolic panel; Future  Pernicious anemia -     CBC with Differential/Platelet; Future -     IBC panel; Future -     Vitamin B12; Future -     Ferritin; Future -     Folate; Future -     Discontinue: FeAsp-B12-FA-C-DSS-SuccAc-Zn (FERIVA 21/7) 75-1 MG TABS; Take 1 tablet by mouth daily.  Iron deficiency anemia- her hemoglobin and hematocrit have improved but her iron level is low, will start iron replacement therapy. -     Discontinue: FeAsp-B12-FA-C-DSS-SuccAc-Zn (FERIVA 21/7) 75-1 MG TABS; Take 1 tablet by mouth daily. -      Ambulatory referral to Gastroenterology  Colon cancer screening -     Ambulatory referral to Gastroenterology   I have discontinued Ms. Myrick's ibuprofen. I am also having her start on aspirin EC. Additionally, I am having her maintain her TRUEPLUS LANCETS 30G, Linaclotide, losartan, hydrochlorothiazide, metFORMIN, pravastatin, and glucose blood.  Meds ordered this encounter  Medications  . aspirin EC 81 MG tablet    Sig: Take 1 tablet (81 mg total) by mouth daily.    Dispense:  90 tablet    Refill:  3  . DISCONTD: FeAsp-B12-FA-C-DSS-SuccAc-Zn (FERIVA 21/7) 75-1 MG TABS    Sig: Take 1 tablet by mouth daily.    Dispense:  28 tablet    Refill:  11     Follow-up: Return in about 4 months (around 09/20/2015).  Scarlette Calico, MD

## 2015-05-22 NOTE — Telephone Encounter (Signed)
Ask her to come pick up a co-pay card It looks like she will not have a co-pay with this card

## 2015-05-22 NOTE — Patient Instructions (Signed)

## 2015-05-22 NOTE — Progress Notes (Signed)
Pre visit review using our clinic review tool, if applicable. No additional management support is needed unless otherwise documented below in the visit note. 

## 2015-05-22 NOTE — Telephone Encounter (Signed)
Received call pt states the new iron pill md rx is tto expensive med cost $35 requesting md to rx something else but generic...Lorraine Holt

## 2015-05-23 ENCOUNTER — Telehealth: Payer: Self-pay | Admitting: Internal Medicine

## 2015-05-23 ENCOUNTER — Encounter: Payer: Self-pay | Admitting: Internal Medicine

## 2015-05-23 DIAGNOSIS — D509 Iron deficiency anemia, unspecified: Secondary | ICD-10-CM

## 2015-05-23 DIAGNOSIS — Z1211 Encounter for screening for malignant neoplasm of colon: Secondary | ICD-10-CM | POA: Insufficient documentation

## 2015-05-23 MED ORDER — FERROUS SULFATE 325 (65 FE) MG PO TABS: 325.0000 mg | ORAL_TABLET | Freq: Two times a day (BID) | ORAL | Status: DC

## 2015-05-23 NOTE — Telephone Encounter (Signed)
12.16.16 Pt came back in after being told about getting a prenatal vitamin to replace Feriva. Pt was told by the pharmacy that she should not take a prenatal because of some of the ingredients differ in each. She is requesting Dr Ronnald Ramp write a prescription for generic iron pills and she wants to get B12 injections. Please contact pt with questions or concerns. MS

## 2015-05-23 NOTE — Telephone Encounter (Signed)
changed

## 2015-05-23 NOTE — Telephone Encounter (Signed)
Pt came in office stating feriva was still 35.00 with co-pay card and wants advisement on alternative. She is aware PCP is out of office and wants another MD advice. I asked Dr. Nathanial Millman advice and she stated she can take an OTC prenatal vitamins.

## 2015-05-23 NOTE — Telephone Encounter (Signed)
12.16.16 Pt came in to pick to up discount card [facility], she stated that she still didn't want to pay $35 dollars for the pills. She wants to know what generic or over the counter brand she can take that is cheaper. MS

## 2015-05-26 NOTE — Telephone Encounter (Signed)
Pt left msg on triage stating wanting to ask md is it ok for her to get B12 injections.Pls advise...Johny Chess

## 2015-05-26 NOTE — Telephone Encounter (Signed)
PT does not want to take prenatal vitamins. She is requesting a script for iron pills and b12 injections

## 2015-05-26 NOTE — Telephone Encounter (Signed)
Notified pt with md response. Pt also stated that she has already had a colonoscopy last year. Dr. Ronnald Ramp put a referral in and she received a call this morning. verifieid where she had colonoscopy done. Pt states it was done 04/27/14 w/Dr. Collene Mares results was normal and was told to repeat in 10 years. Abstracted colonoscopy results...Lorraine Holt

## 2015-05-26 NOTE — Telephone Encounter (Signed)
Response on b12 was adressed. Please advise on iron supplement

## 2015-05-26 NOTE — Telephone Encounter (Signed)
Her B12 level is too high for this

## 2015-05-26 NOTE — Telephone Encounter (Signed)
I sent a generic iron option to her pharmacy last week

## 2015-06-19 ENCOUNTER — Encounter: Payer: Self-pay | Admitting: Internal Medicine

## 2015-06-19 DIAGNOSIS — H52223 Regular astigmatism, bilateral: Secondary | ICD-10-CM | POA: Diagnosis not present

## 2015-06-19 DIAGNOSIS — H524 Presbyopia: Secondary | ICD-10-CM | POA: Diagnosis not present

## 2015-06-19 DIAGNOSIS — E1165 Type 2 diabetes mellitus with hyperglycemia: Secondary | ICD-10-CM | POA: Diagnosis not present

## 2015-07-01 LAB — HM DIABETES EYE EXAM

## 2015-07-23 ENCOUNTER — Ambulatory Visit (INDEPENDENT_AMBULATORY_CARE_PROVIDER_SITE_OTHER)
Admission: RE | Admit: 2015-07-23 | Discharge: 2015-07-23 | Disposition: A | Discharge status: Home / Self Care | Payer: 59 | Source: Ambulatory Visit | Attending: Internal Medicine | Admitting: Internal Medicine

## 2015-07-23 ENCOUNTER — Ambulatory Visit (INDEPENDENT_AMBULATORY_CARE_PROVIDER_SITE_OTHER): Payer: 59 | Admitting: Internal Medicine

## 2015-07-23 ENCOUNTER — Encounter: Payer: Self-pay | Admitting: Internal Medicine

## 2015-07-23 VITALS — BP 120/88 | HR 86 | Temp 98.2°F | Resp 16 | Ht 68.0 in | Wt 321.0 lb

## 2015-07-23 DIAGNOSIS — R05 Cough: Secondary | ICD-10-CM | POA: Diagnosis not present

## 2015-07-23 DIAGNOSIS — Z720 Tobacco use: Secondary | ICD-10-CM | POA: Diagnosis not present

## 2015-07-23 DIAGNOSIS — J988 Other specified respiratory disorders: Secondary | ICD-10-CM | POA: Diagnosis not present

## 2015-07-23 DIAGNOSIS — J449 Chronic obstructive pulmonary disease, unspecified: Secondary | ICD-10-CM

## 2015-07-23 DIAGNOSIS — R059 Cough, unspecified: Secondary | ICD-10-CM

## 2015-07-23 DIAGNOSIS — J22 Unspecified acute lower respiratory infection: Secondary | ICD-10-CM

## 2015-07-23 DIAGNOSIS — J4489 Other specified chronic obstructive pulmonary disease: Secondary | ICD-10-CM | POA: Insufficient documentation

## 2015-07-23 MED ORDER — HYDROCODONE-HOMATROPINE 5-1.5 MG/5ML PO SYRP: 5.0000 mL | ORAL_SOLUTION | Freq: Three times a day (TID) | ORAL | Status: DC | PRN

## 2015-07-23 MED ORDER — UMECLIDINIUM-VILANTEROL 62.5-25 MCG/INH IN AEPB: 1.0000 | INHALATION_SPRAY | Freq: Every day | RESPIRATORY_TRACT | Status: DC

## 2015-07-23 MED ORDER — LEVOFLOXACIN 750 MG PO TABS: 750.0000 mg | ORAL_TABLET | Freq: Every day | ORAL | Status: AC

## 2015-07-23 MED FILL — HYDROCODONE-HOMATROPINE SYR: 5-1.5 | 8 days supply | Qty: 120 | Fill #0

## 2015-07-23 MED FILL — levoFLOXacin 750 MG TABS: 750 | 7 days supply | Qty: 7 | Fill #0

## 2015-07-23 NOTE — Progress Notes (Signed)
Subjective:  Patient ID: Lorraine Holt, female    DOB: 1966/04/26  Age: 50 y.o. MRN: [id number]  CC: Cough   HPI Lorraine Holt presents for a 1 week hx of cough productive of dark yellow phlegm that is occasionally blood-tinged, with ST, chills, SOB, wheezing, and HA but no fever or night sweats.  Outpatient Prescriptions Prior to Visit  Medication Sig Dispense Refill  . aspirin EC 81 MG tablet Take 1 tablet (81 mg total) by mouth daily. 90 tablet 3  . ferrous sulfate 325 (65 FE) MG tablet Take 1 tablet (325 mg total) by mouth 2 (two) times daily with a meal. 180 tablet 3  . glucose blood test strip Test up to twice daily 100 each 12  . hydrochlorothiazide (MICROZIDE) 12.5 MG capsule TAKE 1 CAPSULE BY MOUTH ONCE DAILY 30 capsule 11  . Linaclotide (LINZESS) 290 MCG CAPS capsule Take 290 mcg by mouth daily.    Marland Kitchen losartan (COZAAR) 100 MG tablet TAKE 1 TABLET BY MOUTH ONCE DAILY 30 tablet 11  . metFORMIN (GLUCOPHAGE) 500 MG tablet TAKE 1 TABLET BY MOUTH TWICE DAILY WITH MEALS 60 tablet 11  . pravastatin (PRAVACHOL) 10 MG tablet TAKE 1 TABLET BY MOUTH ONCE DAILY 30 tablet 11  . TRUEPLUS LANCETS 30G MISC USE TWICE DAILY 100 each 11   No facility-administered medications prior to visit.    ROS Review of Systems  Constitutional: Positive for chills. Negative for fever, diaphoresis and fatigue.  HENT: Positive for sore throat. Negative for congestion, rhinorrhea, sinus pressure and trouble swallowing.   Eyes: Negative.   Respiratory: Positive for cough, shortness of breath and wheezing. Negative for choking, chest tightness and stridor.   Cardiovascular: Negative.  Negative for chest pain, palpitations and leg swelling.  Gastrointestinal: Negative.  Negative for nausea, vomiting, diarrhea, constipation and anal bleeding.  Endocrine: Negative.   Genitourinary: Negative.   Musculoskeletal: Negative.  Negative for myalgias, back pain, joint swelling and arthralgias.  Skin: Negative.  Negative  for color change and rash.  Allergic/Immunologic: Negative.   Neurological: Positive for headaches. Negative for dizziness, tremors, weakness, light-headedness and numbness.  Hematological: Negative.  Negative for adenopathy. Does not bruise/bleed easily.  Psychiatric/Behavioral: Negative.     Objective:  BP 120/88 mmHg  Pulse 86  Temp(Src) 98.2 F (36.8 C) (Oral)  Resp 16  Ht 5\' 8"  (1.727 m)  Wt 321 lb (145.605 kg)  BMI 48.82 kg/m2  SpO2 97%  LMP 08/08/2014  BP Readings from Last 3 Encounters:  07/23/15 120/88  05/22/15 142/90  05/05/15 136/94    Wt Readings from Last 3 Encounters:  07/23/15 321 lb (145.605 kg)  05/22/15 330 lb (149.687 kg)  05/05/15 324 lb 12.8 oz (147.328 kg)    Physical Exam  Constitutional: She is oriented to person, place, and time.  Non-toxic appearance. She does not have a sickly appearance. She does not appear ill. No distress.  HENT:  Mouth/Throat: Oropharynx is clear and moist. No oropharyngeal exudate.  Eyes: Conjunctivae are normal. Right eye exhibits no discharge. Left eye exhibits no discharge. No scleral icterus.  Neck: Normal range of motion. Neck supple. No JVD present. No tracheal deviation present. No thyromegaly present.  Cardiovascular: Normal rate, regular rhythm, normal heart sounds and intact distal pulses.  Exam reveals no gallop and no friction rub.   No murmur heard. Pulmonary/Chest: Effort normal and breath sounds normal. No stridor. No respiratory distress. She has no wheezes. She has no rales. She exhibits no tenderness.  Abdominal: Soft. Bowel sounds are normal. She exhibits no distension and no mass. There is no tenderness. There is no rebound and no guarding.  Musculoskeletal: Normal range of motion. She exhibits no edema or tenderness.  Lymphadenopathy:    She has no cervical adenopathy.  Neurological: She is oriented to person, place, and time.  Skin: Skin is warm and dry. No rash noted. She is not diaphoretic. No  erythema. No pallor.  Vitals reviewed.   Lab Results  Component Value Date   WBC 6.7 05/22/2015   HGB 13.5 05/22/2015   HCT 41.8 05/22/2015   PLT 254.0 05/22/2015   GLUCOSE 136* 05/22/2015   CHOL 150 05/22/2015   TRIG 100.0 05/22/2015   HDL 38.70* 05/22/2015   LDLCALC 91 05/22/2015   ALT 13 05/22/2015   AST 14 05/22/2015   NA 137 05/22/2015   K 3.8 05/22/2015   CL 102 05/22/2015   CREATININE 0.75 05/22/2015   BUN 9 05/22/2015   CO2 31 05/22/2015   TSH 4.24 05/22/2015   HGBA1C 7.2 05/05/2015   MICROALBUR 1.5 06/26/2014   Dg Chest 2 View  07/23/2015  CLINICAL DATA:  One year of cough and chest congestion, symptoms worse in the morning, current smoker, history of bronchitis and diabetes EXAM: CHEST  2 VIEW COMPARISON:  PA and lateral chest x-ray of Oct 21, 2011 FINDINGS: The lungs are well-expanded. The interstitial markings are coarse bilaterally and have become more conspicuous. There is no alveolar infiltrate, parenchymal mass, or abnormal nodule. The cardiac silhouette is mildly enlarged. The pulmonary vascularity is normal. The mediastinum is normal in width. There is tortuosity of the descending thoracic aorta. There is mild degenerative disc disease of the thoracic spine. IMPRESSION: Chronic bronchitic changes. Increased pulmonary interstitial markings likely related to the patient's smoking history. There is no alveolar pneumonia nor pulmonary edema. Mild cardiomegaly without pulmonary vascular congestion. Electronically Signed   By: David  Martinique M.D.   On: 07/23/2015 09:10   No results found.  Assessment & Plan:   [place] was seen today for cough.  Diagnoses and all orders for this visit:  Tobacco abuse- she is not ready to quit smoking at this time, I have ordered PFTs to see if she has developed tobacco related lung disease -     Pulmonary Function Test; Future  LRTI (lower respiratory tract infection)- I will treat the infection with high-dose Levaquin and will  control the cough with Hycodan. -     levofloxacin (LEVAQUIN) 750 MG tablet; Take 1 tablet (750 mg total) by mouth daily. -     HYDROcodone-homatropine (HYCODAN) 5-1.5 MG/5ML syrup; Take 5 mLs by mouth every 8 (eight) hours as needed for cough. -     DG Chest 2 View; Future  Cough- chest x-ray is positive for bronchitic changes but no masses or infiltrates -     Pulmonary Function Test; Future -     HYDROcodone-homatropine (HYCODAN) 5-1.5 MG/5ML syrup; Take 5 mLs by mouth every 8 (eight) hours as needed for cough. -     DG Chest 2 View; Future  Obstructive chronic bronchitis without exacerbation ([place])-  I will start treating this with Anoro and have ordered PFTs to see how severe this is. -     umeclidinium-vilanterol (ANORO ELLIPTA) 62.5-25 MCG/INH AEPB; Inhale 1 puff into the lungs daily. -     Pulmonary Function Test; Future  I am having Ms. Matura start on umeclidinium-vilanterol, levofloxacin, and HYDROcodone-homatropine. I am also having her maintain her TRUEPLUS  LANCETS 30G, Linaclotide, losartan, hydrochlorothiazide, metFORMIN, pravastatin, glucose blood, aspirin EC, and ferrous sulfate.  Meds ordered this encounter  Medications  . umeclidinium-vilanterol (ANORO ELLIPTA) 62.5-25 MCG/INH AEPB    Sig: Inhale 1 puff into the lungs daily.    Dispense:  90 each    Refill:  3  . levofloxacin (LEVAQUIN) 750 MG tablet    Sig: Take 1 tablet (750 mg total) by mouth daily.    Dispense:  7 tablet    Refill:  0  . HYDROcodone-homatropine (HYCODAN) 5-1.5 MG/5ML syrup    Sig: Take 5 mLs by mouth every 8 (eight) hours as needed for cough.    Dispense:  120 mL    Refill:  0     Follow-up: Return in about 6 weeks (around 09/03/2015).  Scarlette Calico, MD

## 2015-07-23 NOTE — Progress Notes (Signed)
Pre visit review using our clinic review tool, if applicable. No additional management support is needed unless otherwise documented below in the visit note. 

## 2015-07-23 NOTE — Patient Instructions (Signed)

## 2015-08-04 ENCOUNTER — Other Ambulatory Visit: Payer: Self-pay

## 2015-08-04 VITALS — BP 132/84 | HR 80 | Resp 16 | Ht 68.0 in | Wt 321.0 lb

## 2015-08-04 DIAGNOSIS — E119 Type 2 diabetes mellitus without complications: Secondary | ICD-10-CM

## 2015-08-04 NOTE — Patient Instructions (Signed)
1. Plan to check blood sugar 3-4 times a week at different times.  Goals of 80-130  before eating and less than 180 1  -2 hours after eating 2. Plan to limit portions sizes of rice and eat more protein 3. Plan to get over 6000 steps a day.  Plan to ride exercise bike 15 minutes twice 4. Plan to complete EMMI programs by 10/06/15 5. Plan to schedule dental exam 6. Plan to return to [facility] in 10/20/15 at 8:45AM

## 2015-08-04 NOTE — Patient Outreach (Signed)
[facility]) Care Management   08/04/2015  Lorraine Holt [birth year] [id number]  Lorraine Holt is an 50 y.o. female.   Member seen for follow up office visit for [facility] program for self management of Type 2 diabetes  Subjective: Member states that she had her physical in December and MD did not change any of her medications.  States she had an upper respiratory infection and she saw the doctor earlier this month.  States she took an antibiotic and he gave her an inhaler to use.  States she is still smoking and she is still thinking about quitting.  States her blood sugars went up some when she was sick but they have not been that bad.  States she has a new position at work and she is walking more.  States she got a Ecologist and on the days she works she gets over 10,000 steps.  States she has been riding her exercise bike for 5 minutes about twice a week.  States she did have her eye exam.  Objective:   Review of Systems  Musculoskeletal: Positive for joint pain.  All other systems reviewed and are negative. Reviewed glucometer 7 day average-118 14 day average-118 30 day average-126  Physical Exam Today's Vitals   08/04/15 0853 08/04/15 0859  BP: 132/84   Pulse: 80   Resp: 16   Height: 1.727 m (5\' 8" )   Weight: 321 lb (145.605 kg)   SpO2: 99%   PainSc: 4  4    Current Medications:   Current Outpatient Prescriptions  Medication Sig Dispense Refill  . aspirin EC 81 MG tablet Take 1 tablet (81 mg total) by mouth daily. 90 tablet 3  . ferrous sulfate 325 (65 FE) MG tablet Take 1 tablet (325 mg total) by mouth 2 (two) times daily with a meal. 180 tablet 3  . glucose blood test strip Test up to twice daily 100 each 12  . hydrochlorothiazide (MICROZIDE) 12.5 MG capsule TAKE 1 CAPSULE BY MOUTH ONCE DAILY 30 capsule 11  . Linaclotide (LINZESS) 290 MCG CAPS capsule Take 290 mcg by mouth daily.    Marland Kitchen losartan (COZAAR) 100 MG tablet TAKE 1 TABLET BY MOUTH ONCE DAILY  30 tablet 11  . metFORMIN (GLUCOPHAGE) 500 MG tablet TAKE 1 TABLET BY MOUTH TWICE DAILY WITH MEALS 60 tablet 11  . pravastatin (PRAVACHOL) 10 MG tablet TAKE 1 TABLET BY MOUTH ONCE DAILY 30 tablet 11  . TRUEPLUS LANCETS 30G MISC USE TWICE DAILY 100 each 11  . umeclidinium-vilanterol (ANORO ELLIPTA) 62.5-25 MCG/INH AEPB Inhale 1 puff into the lungs daily. 90 each 3  . HYDROcodone-homatropine (HYCODAN) 5-1.5 MG/5ML syrup Take 5 mLs by mouth every 8 (eight) hours as needed for cough. (Patient not taking: Reported on 08/04/2015) 120 mL 0   No current facility-administered medications for this visit.    Functional Status:   In your present state of health, do you have any difficulty performing the following activities: 08/04/2015 05/05/2015  Hearing? N N  Vision? N N  Difficulty concentrating or making decisions? N N  Walking or climbing stairs? N N  Dressing or bathing? N N  Doing errands, shopping? N N    Fall/Depression Screening:    PHQ 2/9 Scores 08/04/2015 05/05/2015 01/27/2015 08/15/2014 01/07/2014  PHQ - 2 Score 0 0 0 0 0    Assessment:   Member seen for follow up office visit for [facility] program for self management of Type 2  diabetes.  Member is slightly over diabetes self management goal of hemoglobin A1C of 7 with reading of 7.2. Member has gotten a Ecologist and is averaging 5000-10,000 steps a day and she has gotten on exercise bike for 5 minutes twice a week. Member is checking blood sugars in AM 3-4 times a week which average 91-148. Member had annual eye exam. Member is due for a dental check up.  Member continues to smoke but is thinking about trying to quit with medication.  Plan:    Plan to check blood sugar 3-4 times a week at different times.  Goals of 80-130  before eating and less than 180 1  -2 hours after eating Plan to limit portions sizes of rice and eat more protein Plan to get over 6000 steps a day.  Plan to ride exercise bike 15 minutes twice Plan to  complete EMMI programs by 10/06/15 Plan to schedule dental exam Plan to return to [facility] in 10/20/15 at 8:45AM [facility] CM Care Plan Problem One        Most Recent Value   Care Plan Problem One  Potential for elevated blood sugars related to dx of Type 2 DM   Role Documenting the Problem One  Care Management Coordinator   Care Plan for Problem One  Active   [facility] Long Term Goal (31-90 days)  Member will have hemoglobin A1C of 7 or below for the next 90 days   [facility] Long Term Goal Start Date  08/04/15 [Members hemoglobin A1C was 7.2 when last checked]   Interventions for Problem One Long Term Goal  Reviewed CHO counting and portion control, Encouraged to eat more protein with meals and at snacks, Reinforced to watch portion sizes of rice with meals, Reinforced importance of  weight loss and discussed ways to lose weight, Praised for wearing Fit Bit and exercising,Reinforced importance of regular exercise to glycemic control, Instructed on importance of regular dental check ups    Peter Garter RN, [facility] Care Management Coordinator-[facility] to [place] [facility] [phone]

## 2015-08-07 MED FILL — TRUE METRIX GLUCOSE TEST ST: 90 days supply | Qty: 200 | Fill #1

## 2015-08-07 MED FILL — HYDROCHLOROTHIAZIDE 12.5 MG: 12.5 | 90 days supply | Qty: 90 | Fill #2

## 2015-08-07 MED FILL — LOSARTAN POTASSIUM 100 MG T: 100 | 90 days supply | Qty: 90 | Fill #2

## 2015-08-07 MED FILL — LINZESS 290 MCG CAPSULE: 290 | 90 days supply | Qty: 90 | Fill #1

## 2015-08-07 MED FILL — PRAVASTATIN SODIUM 10 MG TA: 10 | 90 days supply | Qty: 90 | Fill #2

## 2015-08-07 MED FILL — metFORMIN HCL 500 MG TABS: 500 | 90 days supply | Qty: 180 | Fill #2

## 2015-09-06 HISTORY — PX: ABDOMINAL HYSTERECTOMY: SHX81

## 2015-10-20 ENCOUNTER — Other Ambulatory Visit: Payer: Self-pay

## 2015-10-20 VITALS — BP 124/86 | HR 84 | Resp 16

## 2015-10-20 DIAGNOSIS — E119 Type 2 diabetes mellitus without complications: Secondary | ICD-10-CM

## 2015-10-20 LAB — POCT GLYCOSYLATED HEMOGLOBIN (HGB A1C): Hemoglobin A1C: 7

## 2015-10-20 NOTE — Patient Outreach (Signed)
[facility]) Care Management   10/20/2015  Lorraine Holt [birth year] [id number]  Lorraine Holt is an 50 y.o. female.   Member seen for follow up office visit for [facility] program for self management of Type 2 diabetes  Subjective: Member states that she has been very busy with her Mother since her step-father died and she has been looking after her most weekends.  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  States she is getting more walking and she usually gets over 10,000 steps when she works.  States she has not been doing any exercise at home.  States that she is trying to eat less rice.  States that her blood sugars go up and down.  States that she feels she is not eating too much but it might be what she is eating.    Objective:   Review of Systems  All other systems reviewed and are negative. POC- HemoglobinA1C- 7% Reviewed glucometer 7 day average-164 14 day average-153 30 day average-150  Physical Exam Today's Vitals   10/20/15 0905 10/20/15 0908  BP: 124/86   Pulse: 84   Resp: 16   SpO2: 93%   PainSc: 2  2    Encounter Medications:   Outpatient Encounter Prescriptions as of 10/20/2015  Medication Sig  . aspirin EC 81 MG tablet Take 1 tablet (81 mg total) by mouth daily.  . ferrous sulfate 325 (65 FE) MG tablet Take 1 tablet (325 mg total) by mouth 2 (two) times daily with a meal.  . glucose blood test strip Test up to twice daily  . hydrochlorothiazide (MICROZIDE) 12.5 MG capsule TAKE 1 CAPSULE BY MOUTH ONCE DAILY  . Linaclotide (LINZESS) 290 MCG CAPS capsule Take 290 mcg by mouth daily.  Marland Kitchen losartan (COZAAR) 100 MG tablet TAKE 1 TABLET BY MOUTH ONCE DAILY  . metFORMIN (GLUCOPHAGE) 500 MG tablet TAKE 1 TABLET BY MOUTH TWICE DAILY WITH MEALS  . pravastatin (PRAVACHOL) 10 MG tablet TAKE 1 TABLET BY MOUTH ONCE DAILY  . TRUEPLUS LANCETS 30G MISC USE TWICE DAILY  . umeclidinium-vilanterol (ANORO ELLIPTA) 62.5-25 MCG/INH AEPB Inhale 1 puff into  the lungs daily. (Patient taking differently: Inhale 1 puff into the lungs daily as needed. )  . HYDROcodone-homatropine (HYCODAN) 5-1.5 MG/5ML syrup Take 5 mLs by mouth every 8 (eight) hours as needed for cough. (Patient not taking: Reported on 08/04/2015)   No facility-administered encounter medications on file as of 10/20/2015.    Functional Status:   In your present state of health, do you have any difficulty performing the following activities: 10/20/2015 08/04/2015  Hearing? N N  Vision? N N  Difficulty concentrating or making decisions? N N  Walking or climbing stairs? N N  Dressing or bathing? N N  Doing errands, shopping? N N    Fall/Depression Screening:    PHQ 2/9 Scores 10/20/2015 08/04/2015 05/05/2015 01/27/2015 08/15/2014 01/07/2014  PHQ - 2 Score 0 0 0 0 0 0    Assessment:  Member seen for follow up office visit for [facility] program for self management of Type 2 diabetes. Member is at diabetes self management goal of hemoglobin A1C of 7 with reading of 7% today Member has gotten a Ecologist and is averaging 5000-10,000 steps a day but she is not riding her exercise bike. Member has gained 8 LB since last visit. Member is checking blood sugars in AM 3-4 times a week which average 80-252. Member had annual eye  exam. Member is still due for a dental check up. Member continues to smoke and is still thinking about trying to quit with medication.  Member is due to go back to MD for follow up and needs to schedule appointment.  Plan:  Plan to check blood sugar 3-4 times a week at different times.  Goals of 80-130  before eating and less than 180 1  -2 hours after eating Plan to regular meals  and with protein and 15 grams of carbohydrate and try to eat more protein Plan to get over 6000 steps a day.  Plan to ride exercise bike 15 minutes three days a week. Plan to complete EMMI programs by 12/06/15 Plan to call to schedule MD appointment for follow up Plan to schedule dental  exam Plan to return to [facility] in 01/19/16 at 8:45AM [facility] CM Care Plan Problem One        Most Recent Value   Care Plan Problem One  Potential for elevated blood sugars related to dx of Type 2 DM   Role Documenting the Problem One  Care Management Coordinator   Care Plan for Problem One  Active   [facility] Long Term Goal (31-90 days)  Member will have hemoglobin A1C of 7 or below for the next 90 days   [facility] Long Term Goal Start Date  10/20/15 [Continue hemoglobin A1C 7% today]   Interventions for Problem One Long Term Goal  Reviewed CHO counting and portion control, Encouraged to eat regular meals with  more protein  and to eat protein with snacks, Given handout on how to join weight Watchers with the [facility] discount,  Reinforced importance of  weight loss and discussed ways to lose weight, Praised for wearing Fit Bit and encouraged to exercising regularly,Reinforced importance of regular exercise to glycemic control, Instructed to call MD to schedule follow up    [place], [employer] Care Management Coordinator-Link to [place] Management [phone]

## 2015-10-20 NOTE — Addendum Note (Signed)
Addended by: Dimitri Ped on: 10/20/2015 11:46 AM   Modules accepted: Orders

## 2015-10-20 NOTE — Patient Instructions (Signed)
1. Plan to check blood sugar 3-4 times a week at different times.  Goals of 80-130  before eating and less than 180 1  -2 hours after eating 2. Plan to regular meals  and with protein and 15 grams of carbohydrate and try to eat more protein 3. Plan to get over 6000 steps a day.  Plan to ride exercise bike 15 minutes three days a week. 4. Plan to complete EMMI programs by 12/06/15 5. Plan to call to schedule MD appointment for follow up 6. Plan to schedule dental exam 7. Plan to return to [facility] in 01/19/16 at 8:45AM

## 2015-12-04 DIAGNOSIS — Z1231 Encounter for screening mammogram for malignant neoplasm of breast: Secondary | ICD-10-CM | POA: Diagnosis not present

## 2015-12-04 LAB — HM MAMMOGRAPHY

## 2015-12-05 ENCOUNTER — Encounter: Payer: Self-pay | Admitting: Internal Medicine

## 2015-12-08 ENCOUNTER — Other Ambulatory Visit: Payer: Self-pay | Admitting: Internal Medicine

## 2015-12-08 DIAGNOSIS — R928 Other abnormal and inconclusive findings on diagnostic imaging of breast: Secondary | ICD-10-CM | POA: Diagnosis not present

## 2015-12-08 DIAGNOSIS — R922 Inconclusive mammogram: Secondary | ICD-10-CM | POA: Diagnosis not present

## 2015-12-08 LAB — HM MAMMOGRAPHY

## 2015-12-08 MED FILL — LOSARTAN POTASSIUM 100 MG T: 100 | 90 days supply | Qty: 90 | Fill #0

## 2015-12-08 MED FILL — TRUE METRIX GLUCOSE TEST ST: 90 days supply | Qty: 200 | Fill #2

## 2015-12-08 MED FILL — HYDROCHLOROTHIAZIDE 12.5 MG: 12.5 | 90 days supply | Qty: 90 | Fill #0

## 2015-12-08 MED FILL — metFORMIN HCL 500 MG TABS: 500 | 90 days supply | Qty: 180 | Fill #0

## 2015-12-10 ENCOUNTER — Encounter: Payer: Self-pay | Admitting: Internal Medicine

## 2016-01-19 ENCOUNTER — Ambulatory Visit: Payer: Self-pay

## 2016-03-17 ENCOUNTER — Ambulatory Visit: Payer: 59 | Admitting: General Practice

## 2016-03-17 DIAGNOSIS — N898 Other specified noninflammatory disorders of vagina: Secondary | ICD-10-CM

## 2016-03-17 LAB — POCT URINALYSIS DIP (DEVICE)
BILIRUBIN URINE: NEGATIVE
Glucose, UA: NEGATIVE mg/dL
Hgb urine dipstick: NEGATIVE
Ketones, ur: NEGATIVE mg/dL
LEUKOCYTES UA: NEGATIVE
NITRITE: NEGATIVE
Protein, ur: NEGATIVE mg/dL
Specific Gravity, Urine: 1.025 (ref 1.005–1.030)
Urobilinogen, UA: 0.2 mg/dL (ref 0.0–1.0)
pH: 5.5 (ref 5.0–8.0)

## 2016-03-17 MED ORDER — FLUCONAZOLE 150 MG PO TABS: 150.0000 mg | ORAL_TABLET | Freq: Once | ORAL | 0 refills | Status: AC

## 2016-03-17 MED FILL — LOSARTAN POTASSIUM 100 MG T: 100 | 90 days supply | Qty: 90 | Fill #1

## 2016-03-17 MED FILL — FLUCONAZOLE 150 MG TABLET: 150 | 1 days supply | Qty: 1 | Fill #0

## 2016-03-17 MED FILL — HYDROCHLOROTHIAZIDE 12.5 MG: 12.5 | 90 days supply | Qty: 90 | Fill #1

## 2016-03-17 MED FILL — PRAVASTATIN NA 10 MG TAB: 10 | 90 days supply | Qty: 90 | Fill #0

## 2016-03-17 MED FILL — metFORMIN HCL 500 MG TABS: 500 | 90 days supply | Qty: 180 | Fill #1

## 2016-04-28 IMAGING — US US TRANSVAGINAL NON-OB
1 series · 14 of 25 positions shown · non-contrast
Comparison: None

CLINICAL DATA: Abnormal uterine bleeding, known uterine fibroids

EXAM:
TRANSABDOMINAL AND TRANSVAGINAL ULTRASOUND OF PELVIS
TECHNIQUE: Both transabdominal and transvaginal ultrasound examinations of the
pelvis were performed. Transabdominal technique was performed for
global imaging of the pelvis including uterus, ovaries, adnexal
regions, and pelvic cul-de-sac. It was necessary to proceed with
endovaginal exam following the transabdominal exam to visualize the
bilateral adnexa.

[Series 1: us pelvis complete · 63 acquisitions, 14 frames shown]
[im 1/63]
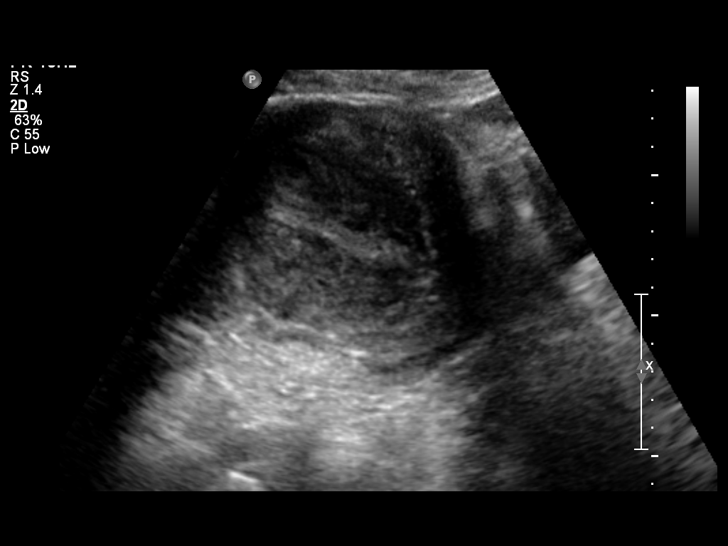
[im 6/63]
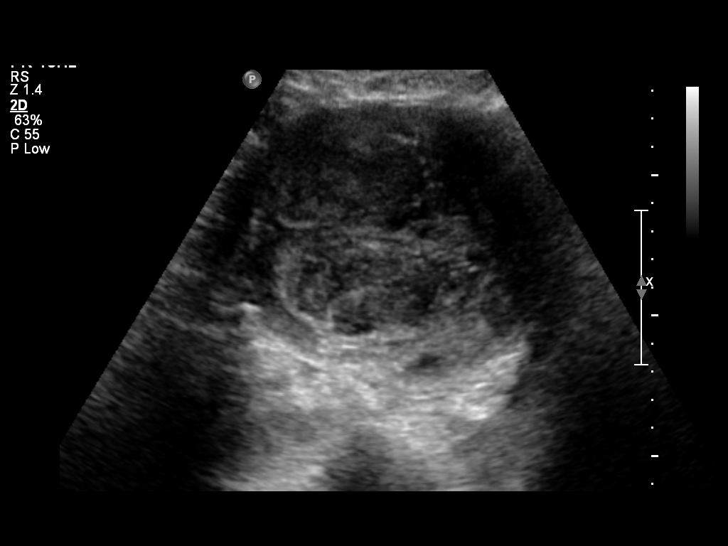
[im 11/63]
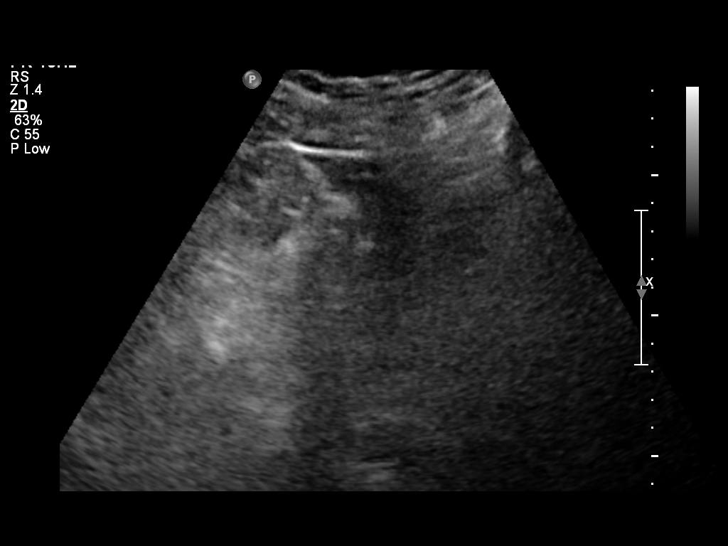
[im 16/63]
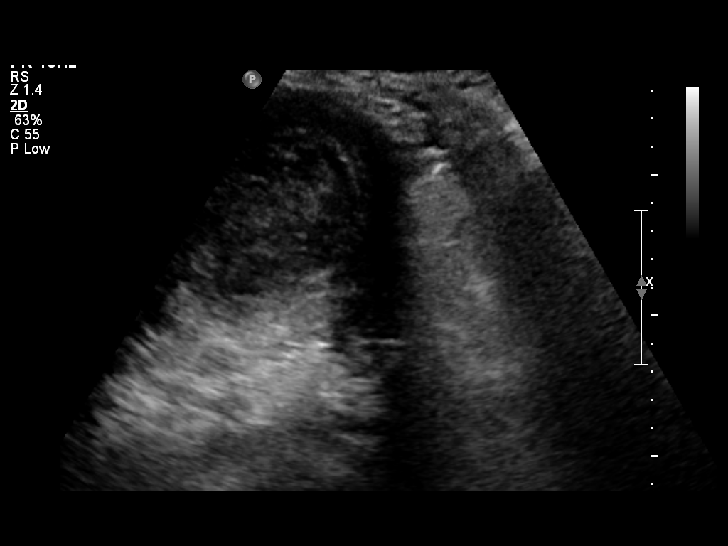
[im 21/63]
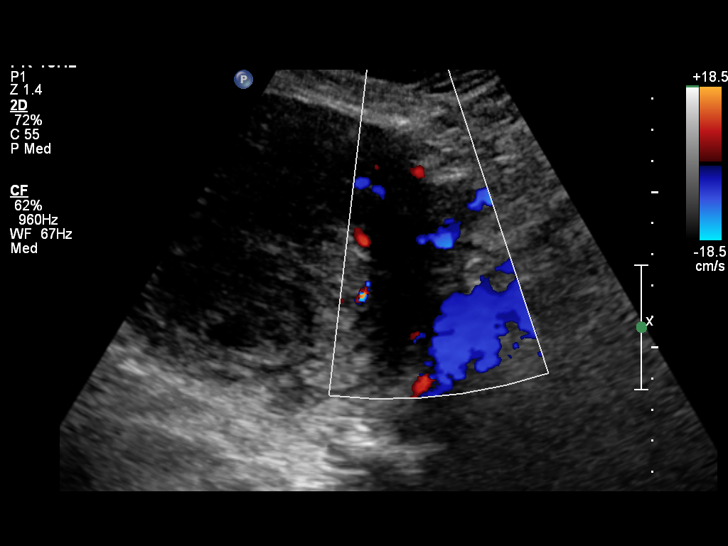
[im 24/63]
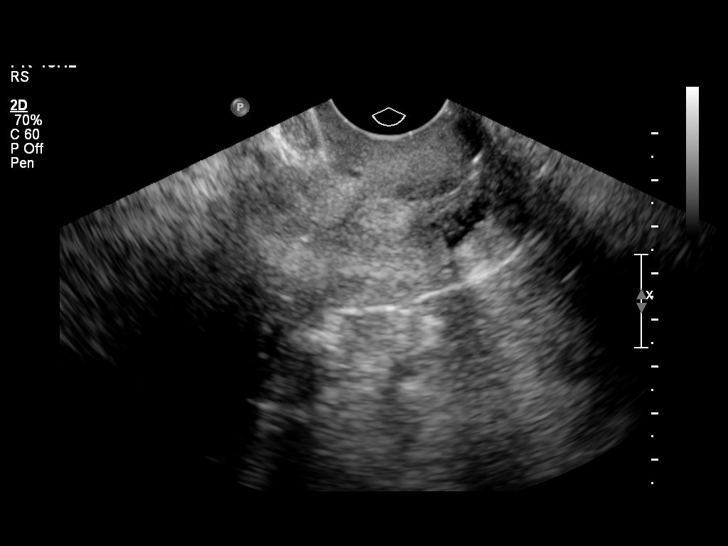
[im 29/63]
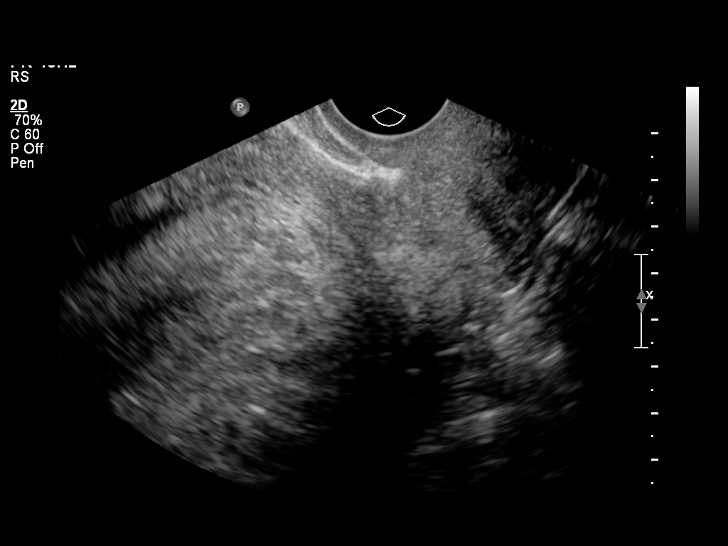
[im 34/63]
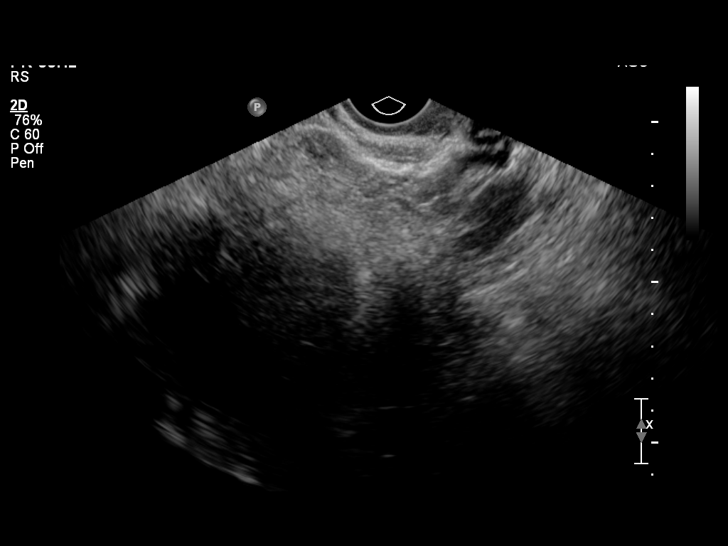
[im 39/63]
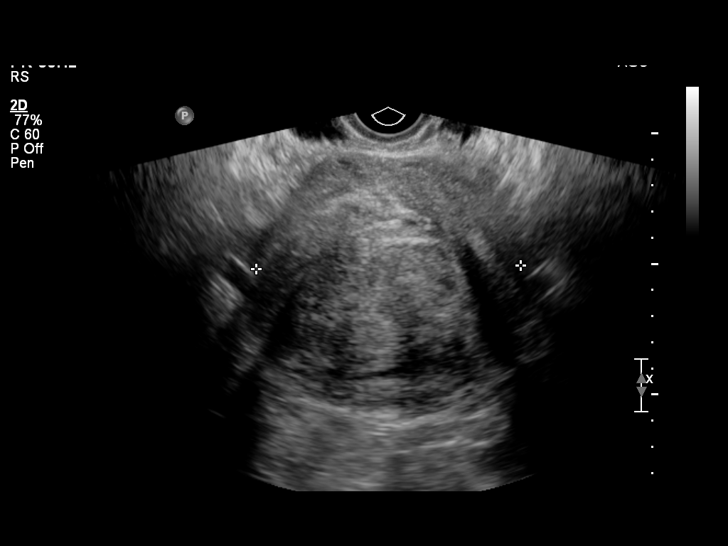
[im 42/63]
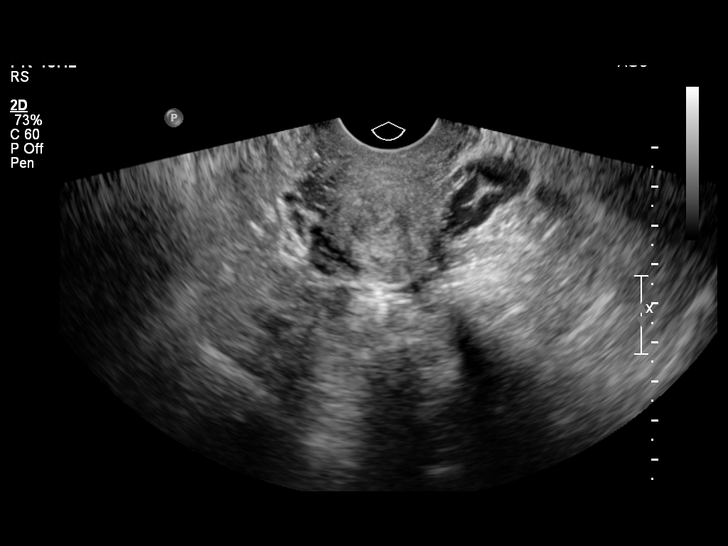
[im 47/63]
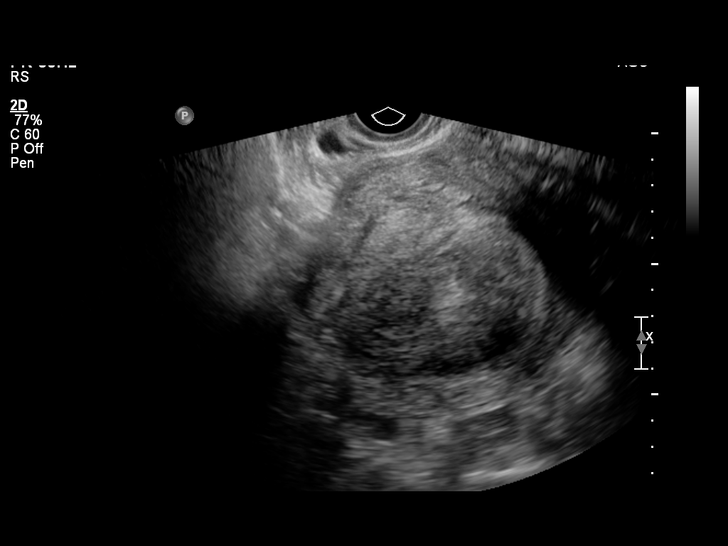
[im 52/63]
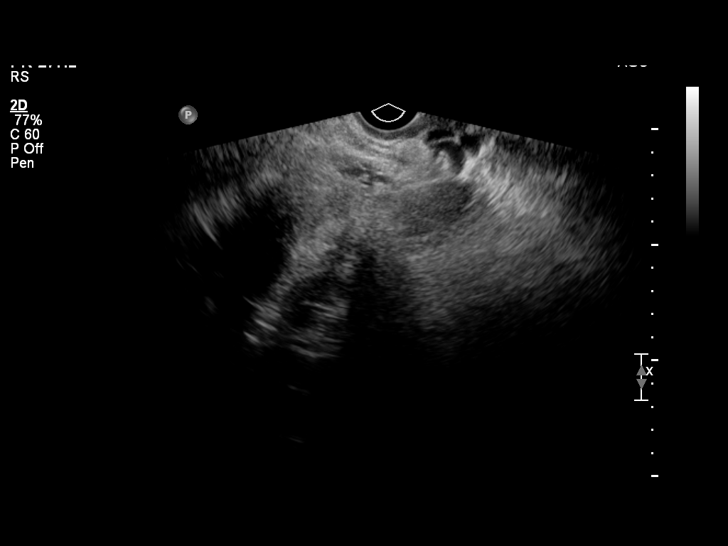
[im 57/63]
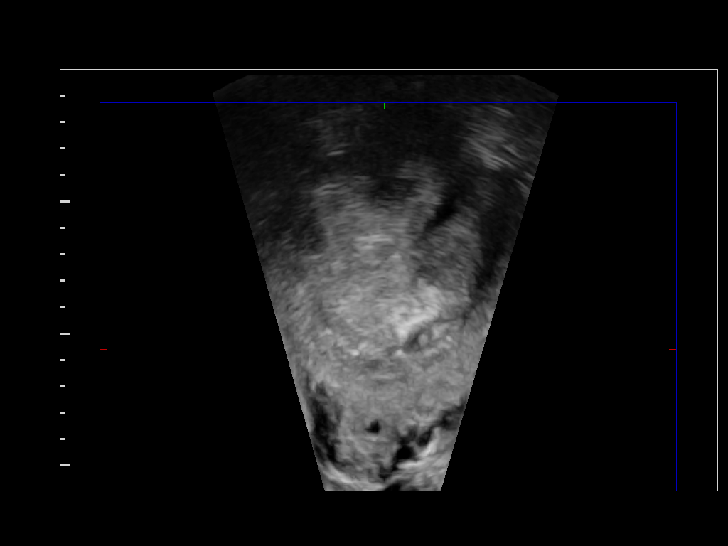
[im 63/63]
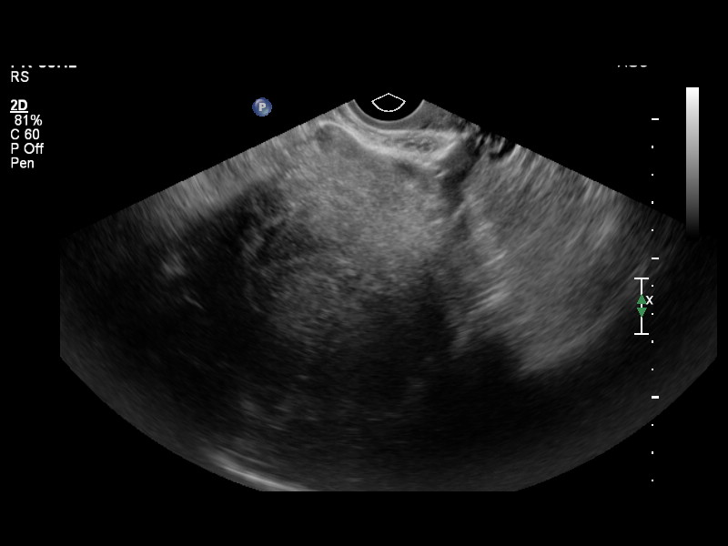

[14 of 25 positions shown; findings below may reference images not displayed]

FINDINGS: Uterus

Measurements: 13.5 x 9.3 x 10.1 cm. Dominant transmural anterior
right fundal fibroid measures 8.8 x 8.3 x 8.5 cm

Endometrium

Not visualized/displaced due to dominant fundal fibroid.

Right ovary

Not discretely visualized.

Left ovary

Not discretely visualized.

Other findings

No free fluid.
IMPRESSION: Enlarged uterus with dominant right fundal fibroid measuring up to
8.8 cm.

## 2016-05-03 ENCOUNTER — Other Ambulatory Visit: Payer: Self-pay

## 2016-05-03 VITALS — BP 132/92 | HR 80 | Resp 16 | Ht 68.0 in | Wt 337.4 lb

## 2016-05-03 DIAGNOSIS — E119 Type 2 diabetes mellitus without complications: Secondary | ICD-10-CM

## 2016-05-03 NOTE — Patient Outreach (Signed)
[place] [facility]) Care Management   05/03/2016  ALECHIA COUSINEAU 1966-05-29 [id number]  Lorraine Holt is an 50 y.o. female.   Member seen for follow up office visit for Link to Wellness program for self management of Type 2 diabetes  Subjective: Member states that she has been more stressed at work and she is looking for a day shift position.  States she has been eating more because of the stress.  States she has not been exercising as much and she has lost the Games developer for her Ecologist.  States she has been trying to ride her exercise bike once a week.  States she is not interested in quitting smoking at this time.    Objective:   Review of Systems  All other systems reviewed and are negative.   Physical Exam Today's Vitals   05/03/16 0839 05/03/16 0846  BP: (!) 132/92   Pulse: 80   Resp: 16   SpO2: 98%   Weight: (!) 337 lb 6.4 oz (153 kg)   Height: 1.727 m (5\' 8" )   PainSc: 0-No pain 0-No pain   Encounter Medications:   Outpatient Encounter Prescriptions as of 05/03/2016  Medication Sig  . aspirin EC 81 MG tablet Take 1 tablet (81 mg total) by mouth daily.  . ferrous sulfate 325 (65 FE) MG tablet Take 1 tablet (325 mg total) by mouth 2 (two) times daily with a meal. (Patient taking differently: Take 325 mg by mouth daily with breakfast. )  . glucose blood test strip Test up to twice daily  . hydrochlorothiazide (MICROZIDE) 12.5 MG capsule TAKE 1 CAPSULE BY MOUTH ONCE DAILY  . losartan (COZAAR) 100 MG tablet TAKE 1 TABLET BY MOUTH ONCE DAILY  . metFORMIN (GLUCOPHAGE) 500 MG tablet TAKE 1 TABLET BY MOUTH TWICE DAILY WITH MEALS  . pravastatin (PRAVACHOL) 10 MG tablet TAKE 1 TABLET BY MOUTH ONCE DAILY  . TRUEPLUS LANCETS 30G MISC USE TWICE DAILY  . HYDROcodone-homatropine (HYCODAN) 5-1.5 MG/5ML syrup Take 5 mLs by mouth every 8 (eight) hours as needed for cough. (Patient not taking: Reported on 05/03/2016)  . Linaclotide (LINZESS) 290 MCG CAPS capsule Take 290 mcg by mouth  daily.  Marland Kitchen umeclidinium-vilanterol (ANORO ELLIPTA) 62.5-25 MCG/INH AEPB Inhale 1 puff into the lungs daily. (Patient not taking: Reported on 05/03/2016)   No facility-administered encounter medications on file as of 05/03/2016.     Functional Status:   In your present state of health, do you have any difficulty performing the following activities: 05/03/2016 10/20/2015  Hearing? N N  Vision? N N  Difficulty concentrating or making decisions? N N  Walking or climbing stairs? N N  Dressing or bathing? N N  Doing errands, shopping? N N  Some recent data might be hidden    Fall/Depression Screening:    PHQ 2/9 Scores 05/03/2016 10/20/2015 08/04/2015 05/05/2015 01/27/2015 08/15/2014 01/07/2014  PHQ - 2 Score 0 0 0 0 0 0 0    Assessment:   Member seen for follow up office visit for Link to Wellness program for self management of Type 2 diabetes. Member is at diabetes self management goal of hemoglobin A1C of 7% with last reading of 7%. Member has gotten a Ecologist and is averaging 5000-10,000 steps a day but she is not riding her exercise bike. Member has gained 7 LB since last visit. Member is checking blood sugars in AM 1-2 times a week which average 116-170. Member had annual eye exam. Member is still due  for a dental check up. Member continues to smoke and is not interested in quitting at this time.  Member having more stress at work and has seen Optometrist Counseling program in the past.  Member is due to go back to MD for follow up and needs to schedule appointment  Plan:  Plan to check blood sugar 3-4 times a week at different times.  Goals of 80-130  before eating and less than 180 1  -2 hours after eating Plan to regular meals  and with protein and 15 grams of carbohydrate and try to eat more protein  Plan to ride exercise bike 15 minutes three days a week. Plan to call to schedule MD appointment for follow up Plan to schedule dental exam Plan to enroll in the [facility]  program Plan to follow through the [facility] program   [facility] CM Care Plan Problem One   Flowsheet Row Most Recent Value  Care Plan Problem One  Potential for elevated blood sugars related to dx of Type 2 DM  Role Documenting the Problem One  Care Management [place] for Problem One  Active  [facility] Long Term Goal (31-90 days)  Member will have hemoglobin A1C of 7 or below for the next 90 days  [facility] Long Term Goal Start Date  05/03/16 [Continue  last hemoglobin A1C 7% ]  Interventions for Problem One Long Term Goal  Reviewed CHO counting and portion control, Encouraged to eat regular meals with  more protein  and to eat protein with snacks, Given handout on the [employer] program and assisted member in enrolling in the program, Reinforced importance of  weight loss and discussed ways to lose weight, Encouraged to get charger for her Fit Bit and encouraged to exercise regularly, Reinforced importance of regular exercise to glycemic control, Reinforced to call MD to schedule follow up    [place], [facility] Care Management Coordinator-Link to [place] Management [phone]

## 2016-05-03 NOTE — Patient Instructions (Signed)
1. Plan to check blood sugar 3-4 times a week at different times.  Goals of 80-130  before eating and less than 180 1  -2 hours after eating 2. Plan to regular meals  and with protein and 15 grams of carbohydrate and try to eat more protein 3.  Plan to ride exercise bike 15 minutes three days a week. 4. Plan to call to schedule MD appointment for follow up 5. Plan to schedule dental exam 6. Plan to follow through the [facility] program

## 2016-05-25 ENCOUNTER — Ambulatory Visit (INDEPENDENT_AMBULATORY_CARE_PROVIDER_SITE_OTHER)
Admission: RE | Admit: 2016-05-25 | Discharge: 2016-05-25 | Disposition: A | Discharge status: Home / Self Care | Payer: 59 | Source: Ambulatory Visit | Attending: Internal Medicine | Admitting: Internal Medicine

## 2016-05-25 ENCOUNTER — Other Ambulatory Visit (INDEPENDENT_AMBULATORY_CARE_PROVIDER_SITE_OTHER): Payer: 59

## 2016-05-25 ENCOUNTER — Encounter: Payer: Self-pay | Admitting: Internal Medicine

## 2016-05-25 ENCOUNTER — Ambulatory Visit (INDEPENDENT_AMBULATORY_CARE_PROVIDER_SITE_OTHER): Payer: 59 | Admitting: Internal Medicine

## 2016-05-25 VITALS — BP 138/80 | HR 86 | Temp 98.2°F | Resp 16 | Ht 68.0 in | Wt 336.8 lb

## 2016-05-25 DIAGNOSIS — E118 Type 2 diabetes mellitus with unspecified complications: Secondary | ICD-10-CM

## 2016-05-25 DIAGNOSIS — R05 Cough: Secondary | ICD-10-CM

## 2016-05-25 DIAGNOSIS — Z0000 Encounter for general adult medical examination without abnormal findings: Secondary | ICD-10-CM

## 2016-05-25 DIAGNOSIS — Z720 Tobacco use: Secondary | ICD-10-CM | POA: Diagnosis not present

## 2016-05-25 DIAGNOSIS — K5909 Other constipation: Secondary | ICD-10-CM

## 2016-05-25 DIAGNOSIS — R059 Cough, unspecified: Secondary | ICD-10-CM

## 2016-05-25 DIAGNOSIS — M170 Bilateral primary osteoarthritis of knee: Secondary | ICD-10-CM

## 2016-05-25 DIAGNOSIS — G4733 Obstructive sleep apnea (adult) (pediatric): Secondary | ICD-10-CM | POA: Insufficient documentation

## 2016-05-25 DIAGNOSIS — J449 Chronic obstructive pulmonary disease, unspecified: Secondary | ICD-10-CM | POA: Diagnosis not present

## 2016-05-25 DIAGNOSIS — K5904 Chronic idiopathic constipation: Secondary | ICD-10-CM

## 2016-05-25 LAB — COMPREHENSIVE METABOLIC PANEL
ALK PHOS: 130 U/L — AB (ref 39–117)
ALT: 13 U/L (ref 0–35)
AST: 12 U/L (ref 0–37)
Albumin: 4 g/dL (ref 3.5–5.2)
BUN: 11 mg/dL (ref 6–23)
CO2: 29 mEq/L (ref 19–32)
Calcium: 9.3 mg/dL (ref 8.4–10.5)
Chloride: 103 mEq/L (ref 96–112)
Creatinine, Ser: 0.73 mg/dL (ref 0.40–1.20)
GFR: 107.31 mL/min (ref >60.00)
GLUCOSE: 125 mg/dL — AB (ref 70–99)
POTASSIUM: 3.9 meq/L (ref 3.5–5.1)
SODIUM: 139 meq/L (ref 135–145)
TOTAL PROTEIN: 7.6 g/dL (ref 6.0–8.3)
Total Bilirubin: 0.5 mg/dL (ref 0.2–1.2)

## 2016-05-25 LAB — URINALYSIS, ROUTINE W REFLEX MICROSCOPIC
Bilirubin Urine: NEGATIVE
Hgb urine dipstick: NEGATIVE
Ketones, ur: NEGATIVE
Leukocytes, UA: NEGATIVE
Nitrite: NEGATIVE
PH: 6 (ref 5.0–8.0)
RBC / HPF: NONE SEEN (ref 0–?)
Total Protein, Urine: NEGATIVE
URINE GLUCOSE: NEGATIVE
Urobilinogen, UA: 0.2 (ref 0.0–1.0)
WBC, UA: NONE SEEN (ref 0–?)

## 2016-05-25 LAB — MICROALBUMIN / CREATININE URINE RATIO
CREATININE, U: 20.3 mg/dL
MICROALB/CREAT RATIO: 3.4 mg/g (ref 0.0–30.0)
Microalb, Ur: <0.7 mg/dL (ref 0.0–1.9)

## 2016-05-25 LAB — CBC WITH DIFFERENTIAL/PLATELET
Basophils Absolute: 0.1 10*3/uL (ref 0.0–0.1)
Basophils Relative: 0.8 % (ref 0.0–3.0)
EOS PCT: 3.4 % (ref 0.0–5.0)
Eosinophils Absolute: 0.3 10*3/uL (ref 0.0–0.7)
HCT: 46.2 % — ABNORMAL HIGH (ref 36.0–46.0)
Hemoglobin: 15.4 g/dL — ABNORMAL HIGH (ref 12.0–15.0)
LYMPHS ABS: 2.7 10*3/uL (ref 0.7–4.0)
Lymphocytes Relative: 32.5 % (ref 12.0–46.0)
MCHC: 33.3 g/dL (ref 30.0–36.0)
MCV: 86.2 fl (ref 78.0–100.0)
MONO ABS: 0.5 10*3/uL (ref 0.1–1.0)
Monocytes Relative: 5.7 % (ref 3.0–12.0)
NEUTROS ABS: 4.7 10*3/uL (ref 1.4–7.7)
NEUTROS PCT: 57.6 % (ref 43.0–77.0)
PLATELETS: 217 10*3/uL (ref 150.0–400.0)
RBC: 5.36 Mil/uL — AB (ref 3.87–5.11)
RDW: 14.8 % (ref 11.5–15.5)
WBC: 8.2 10*3/uL (ref 4.0–10.5)

## 2016-05-25 LAB — LIPID PANEL
Cholesterol: 169 mg/dL (ref 0–200)
HDL: 43.7 mg/dL (ref >39.00)
LDL Cholesterol: 108 mg/dL — ABNORMAL HIGH (ref 0–99)
NONHDL: 125.78
Total CHOL/HDL Ratio: 4
Triglycerides: 88 mg/dL (ref 0.0–149.0)
VLDL: 17.6 mg/dL (ref 0.0–40.0)

## 2016-05-25 LAB — HEMOGLOBIN A1C: Hgb A1c MFr Bld: 7 % — ABNORMAL HIGH (ref 4.6–6.5)

## 2016-05-25 LAB — TSH: TSH: 2.56 u[IU]/mL (ref 0.35–4.50)

## 2016-05-25 MED ORDER — LUBIPROSTONE 24 MCG PO CAPS: 24.0000 ug | ORAL_CAPSULE | Freq: Two times a day (BID) | ORAL | 3 refills | Status: DC

## 2016-05-25 MED ORDER — NAPROXEN 500 MG PO TABS: 500.0000 mg | ORAL_TABLET | Freq: Two times a day (BID) | ORAL | 3 refills | Status: DC

## 2016-05-25 MED ORDER — INDACATEROL-GLYCOPYRROLATE 27.5-15.6 MCG IN CAPS: 1.0000 | ORAL_CAPSULE | Freq: Two times a day (BID) | RESPIRATORY_TRACT | 11 refills | Status: DC

## 2016-05-25 MED FILL — NAPROXEN 500 MG TABLET: 500 | 30 days supply | Qty: 60 | Fill #0

## 2016-05-25 MED FILL — UTIBRON NEOHALER 27.5-15.6: 27.5-15.6 | 30 days supply | Qty: 60 | Fill #0

## 2016-05-25 MED FILL — AMITIZA 24 MCG CAPSULES: 24 | 90 days supply | Qty: 180 | Fill #0

## 2016-05-25 NOTE — Patient Instructions (Signed)

## 2016-05-25 NOTE — Progress Notes (Signed)
Pre visit review using our clinic review tool, if applicable. No additional management support is needed unless otherwise documented below in the visit note. 

## 2016-05-25 NOTE — Progress Notes (Signed)
Subjective:  Patient ID: Lorraine Holt, female    DOB: [birth year]  Age: 50 y.o. MRN: [id number]  CC: Annual Exam; Diabetes; and Hyperlipidemia   HPI Lorraine Holt presents for a CPX.  She complains of chronic cough productive of clear phlegm with a few episodes of wheezing and shortness of breath. She was previously prescribed Anoro but she couldn't get it because she says it wasn't on her insurance. She has had a few episodes of right upper chest wall and shoulder pain. She denies hemoptysis, DOE, diaphoresis, palpitations, edema, or fatigue.  She complains of bilateral knee pain and wants to continue taking naproxen.  She tells me her blood pressure has been well controlled at home. She also thinks her blood sugars are well-controlled as she has had no polys. She is tolerating metformin well with no abdominal pain, cramping, or diarrhea. She complains of chronic constipation. She was previously prescribed Linzess but said she didn't tolerate it very well.  Outpatient Medications Prior to Visit  Medication Sig Dispense Refill  . aspirin EC 81 MG tablet Take 1 tablet (81 mg total) by mouth daily. 90 tablet 3  . ferrous sulfate 325 (65 FE) MG tablet Take 1 tablet (325 mg total) by mouth 2 (two) times daily with a meal. (Patient taking differently: Take 325 mg by mouth daily with breakfast. ) 180 tablet 3  . glucose blood test strip Test up to twice daily 100 each 12  . hydrochlorothiazide (MICROZIDE) 12.5 MG capsule TAKE 1 CAPSULE BY MOUTH ONCE DAILY 90 capsule 1  . losartan (COZAAR) 100 MG tablet TAKE 1 TABLET BY MOUTH ONCE DAILY 90 tablet 1  . metFORMIN (GLUCOPHAGE) 500 MG tablet TAKE 1 TABLET BY MOUTH TWICE DAILY WITH MEALS 180 tablet 1  . pravastatin (PRAVACHOL) 10 MG tablet TAKE 1 TABLET BY MOUTH ONCE DAILY 90 tablet 1  . TRUEPLUS LANCETS 30G MISC USE TWICE DAILY 100 each 11  . HYDROcodone-homatropine (HYCODAN) 5-1.5 MG/5ML syrup Take 5 mLs by mouth every 8 (eight) hours as needed for  cough. (Patient not taking: Reported on 05/03/2016) 120 mL 0  . Linaclotide (LINZESS) 290 MCG CAPS capsule Take 290 mcg by mouth daily.    Marland Kitchen umeclidinium-vilanterol (ANORO ELLIPTA) 62.5-25 MCG/INH AEPB Inhale 1 puff into the lungs daily. (Patient not taking: Reported on 05/25/2016) 90 each 3   No facility-administered medications prior to visit.     ROS Review of Systems  Constitutional: Negative for activity change, appetite change, chills, diaphoresis, fatigue, fever and unexpected weight change.  HENT: Negative.  Negative for facial swelling, sinus pressure and trouble swallowing.   Eyes: Negative for photophobia and visual disturbance.  Respiratory: Positive for cough, shortness of breath and wheezing. Negative for choking, chest tightness and stridor.        She complains of heavy snoring and is concerned she may have sleep apnea.  Cardiovascular: Positive for chest pain. Negative for palpitations and leg swelling.       She has intermittent, sharp, right upper anterior chest wall pain  Gastrointestinal: Positive for constipation. Negative for abdominal pain, anal bleeding, blood in stool, diarrhea and nausea.  Endocrine: Negative.  Negative for polydipsia, polyphagia and polyuria.  Genitourinary: Negative.  Negative for decreased urine volume, difficulty urinating, dysuria, flank pain and urgency.  Musculoskeletal: Positive for arthralgias. Negative for back pain, joint swelling, myalgias and neck pain.  Skin: Negative.  Negative for color change and rash.  Allergic/Immunologic: Negative.   Neurological: Negative.  Negative for  dizziness, tremors, weakness, numbness and headaches.  Hematological: Negative for adenopathy. Does not bruise/bleed easily.  Psychiatric/Behavioral: Negative.     Objective:  BP 138/80 (BP Location: Left Arm, Patient Position: Sitting, Cuff Size: Large)   Pulse 86   Temp 98.2 F (36.8 C) (Oral)   Resp 16   Ht 5\' 8"  (1.727 m)   Wt (!) 336 lb 12 oz  (152.7 kg)   LMP 08/08/2014   SpO2 97%   BMI 51.20 kg/m   BP Readings from Last 3 Encounters:  05/25/16 138/80  05/03/16 (!) 132/92  10/20/15 124/86    Wt Readings from Last 3 Encounters:  05/25/16 (!) 336 lb 12 oz (152.7 kg)  05/03/16 (!) 337 lb 6.4 oz (153 kg)  08/04/15 (!) 321 lb (145.6 kg)    Physical Exam  Constitutional: She is oriented to person, place, and time. No distress.  HENT:  Head: Normocephalic and atraumatic.  Mouth/Throat: Oropharynx is clear and moist. No oropharyngeal exudate.  Eyes: Conjunctivae are normal. Right eye exhibits no discharge. Left eye exhibits no discharge. No scleral icterus.  Neck: Normal range of motion. Neck supple. No JVD present. No tracheal deviation present. No thyromegaly present.  Cardiovascular: Normal rate, regular rhythm, normal heart sounds and intact distal pulses.  Exam reveals no gallop and no friction rub.   No murmur heard. Pulmonary/Chest: Effort normal and breath sounds normal. No stridor. No respiratory distress. She has no wheezes. She has no rales. She exhibits no tenderness.  Abdominal: Soft. Bowel sounds are normal. She exhibits no distension and no mass. There is no tenderness. There is no rebound and no guarding.  Musculoskeletal: Normal range of motion. She exhibits no edema, tenderness or deformity.  Lymphadenopathy:    She has no cervical adenopathy.  Neurological: She is oriented to person, place, and time.  Skin: Skin is warm and dry. No rash noted. She is not diaphoretic. No erythema. No pallor.  Psychiatric: She has a normal mood and affect. Her behavior is normal. Judgment and thought content normal.  Vitals reviewed.   Lab Results  Component Value Date   WBC 8.2 05/25/2016   HGB 15.4 (H) 05/25/2016   HCT 46.2 (H) 05/25/2016   PLT 217.0 05/25/2016   GLUCOSE 125 (H) 05/25/2016   CHOL 169 05/25/2016   TRIG 88.0 05/25/2016   HDL 43.70 05/25/2016   LDLCALC 108 (H) 05/25/2016   ALT 13 05/25/2016   AST  12 05/25/2016   NA 139 05/25/2016   K 3.9 05/25/2016   CL 103 05/25/2016   CREATININE 0.73 05/25/2016   BUN 11 05/25/2016   CO2 29 05/25/2016   TSH 2.56 05/25/2016   HGBA1C 7.0 (H) 05/25/2016   MICROALBUR <0.7 05/25/2016    Dg Chest 2 View  Result Date: 07/23/2015 CLINICAL DATA:  One year of cough and chest congestion, symptoms worse in the morning, current smoker, history of bronchitis and diabetes EXAM: CHEST  2 VIEW COMPARISON:  PA and lateral chest x-ray of Oct 21, 2011 FINDINGS: The lungs are well-expanded. The interstitial markings are coarse bilaterally and have become more conspicuous. There is no alveolar infiltrate, parenchymal mass, or abnormal nodule. The cardiac silhouette is mildly enlarged. The pulmonary vascularity is normal. The mediastinum is normal in width. There is tortuosity of the descending thoracic aorta. There is mild degenerative disc disease of the thoracic spine. IMPRESSION: Chronic bronchitic changes. Increased pulmonary interstitial markings likely related to the patient's smoking history. There is no alveolar pneumonia nor pulmonary edema.  Mild cardiomegaly without pulmonary vascular congestion. Electronically Signed   By: David  Martinique M.D.   On: 07/23/2015 09:10    Assessment & Plan:   Gabreal was seen today for annual exam, diabetes and hyperlipidemia.  Diagnoses and all orders for this visit:  Type 2 diabetes mellitus with complication, without long-term current use of insulin ([place])- her A1c is 7.0%, blood sugars are adequately well controlled on metformin, I have asked her to improve her lifestyle modifications. -     Hemoglobin A1c; Future -     Microalbumin / creatinine urine ratio; Future  Routine general medical examination at a health care facility- exam completed, labs ordered and reviewed, vaccines reviewed and updated, Pap smear and mammogram are up-to-date, colonoscopy is up-to-date, patient education material was given. -     Lipid panel;  Future -     Comprehensive metabolic panel; Future -     CBC with Differential/Platelet; Future -     TSH; Future -     Urinalysis, Routine w reflex microscopic; Future -     Hepatitis C antibody; Future  Tobacco abuse- she was asked to quit smoking, I've asked her to undergo a limited CT scan to screen for lung cancer. -     Ambulatory Referral for Lung Cancer Scre  Severe obesity (BMI >= 40) ([place])- she agrees to work on her lifestyle modifications to lose weight.  Obstructive chronic bronchitis without exacerbation ([place])- will start a LAMA/LABA combination in addition to smoking cessation -     Indacaterol-Glycopyrrolate (UTIBRON NEOHALER) 27.5-15.6 MCG CAPS; Place 1 puff into inhaler and inhale 2 (two) times daily.  OSA (obstructive sleep apnea) -     Ambulatory referral to Pulmonology  Cough- check her chest x-ray to screen for lung pathology. -     DG Abd Acute W/Chest; Future  Other constipation- she doesn't take any meds that would cause constipation and her labs are negative for any secondary or metabolic causes. I've asked her to try Amitiza for symptom relief. -     DG Abd Acute W/Chest; Future -     lubiprostone (AMITIZA) 24 MCG capsule; Take 1 capsule (24 mcg total) by mouth 2 (two) times daily with a meal.  Primary osteoarthritis of both knees -     naproxen (NAPROSYN) 500 MG tablet; Take 1 tablet (500 mg total) by mouth 2 (two) times daily with a meal.  Chronic idiopathic constipation   I have discontinued Ms. Granillo's linaclotide, umeclidinium-vilanterol, and HYDROcodone-homatropine. I am also having her start on naproxen, Indacaterol-Glycopyrrolate, and lubiprostone. Additionally, I am having her maintain her TRUEPLUS LANCETS 30G, glucose blood, aspirin EC, ferrous sulfate, metFORMIN, pravastatin, hydrochlorothiazide, and losartan.  Meds ordered this encounter  Medications  . naproxen (NAPROSYN) 500 MG tablet    Sig: Take 1 tablet (500 mg total) by mouth 2 (two)  times daily with a meal.    Dispense:  60 tablet    Refill:  3  . Indacaterol-Glycopyrrolate (UTIBRON NEOHALER) 27.5-15.6 MCG CAPS    Sig: Place 1 puff into inhaler and inhale 2 (two) times daily.    Dispense:  60 capsule    Refill:  11  . lubiprostone (AMITIZA) 24 MCG capsule    Sig: Take 1 capsule (24 mcg total) by mouth 2 (two) times daily with a meal.    Dispense:  180 capsule    Refill:  3     Follow-up: Return in about 3 months (around 08/23/2016).  Scarlette Calico, MD

## 2016-05-26 ENCOUNTER — Encounter: Payer: Self-pay | Admitting: Internal Medicine

## 2016-05-26 LAB — HEPATITIS C ANTIBODY: HCV Ab: NEGATIVE

## 2016-06-17 ENCOUNTER — Other Ambulatory Visit: Payer: Self-pay | Admitting: Internal Medicine

## 2016-06-29 LAB — HM DIABETES EYE EXAM

## 2016-07-05 MED FILL — HYDROCHLOROTHIAZIDE 12.5 MG: 12.5 | 90 days supply | Qty: 90 | Fill #0

## 2016-07-05 MED FILL — NAPROXEN 500 MG TABLET: 500 | 30 days supply | Qty: 60 | Fill #1

## 2016-07-05 MED FILL — LOSARTAN POTASSIUM 100 MG T: 100 | 90 days supply | Qty: 90 | Fill #0

## 2016-07-05 MED FILL — PRAVASTATIN NA 10 MG TAB: 10 | 90 days supply | Qty: 90 | Fill #1

## 2016-07-05 MED FILL — metFORMIN HCL 500 MG TABS: 500 | 90 days supply | Qty: 180 | Fill #0

## 2016-07-20 ENCOUNTER — Ambulatory Visit (INDEPENDENT_AMBULATORY_CARE_PROVIDER_SITE_OTHER): Payer: 59 | Admitting: Pulmonary Disease

## 2016-07-20 ENCOUNTER — Encounter: Payer: Self-pay | Admitting: Pulmonary Disease

## 2016-07-20 ENCOUNTER — Telehealth: Payer: Self-pay | Admitting: Pulmonary Disease

## 2016-07-20 VITALS — BP 132/82 | HR 88 | Ht 70.5 in | Wt 341.0 lb

## 2016-07-20 DIAGNOSIS — Z720 Tobacco use: Secondary | ICD-10-CM | POA: Diagnosis not present

## 2016-07-20 DIAGNOSIS — R0683 Snoring: Secondary | ICD-10-CM

## 2016-07-20 DIAGNOSIS — Z716 Tobacco abuse counseling: Secondary | ICD-10-CM

## 2016-07-20 DIAGNOSIS — R0600 Dyspnea, unspecified: Secondary | ICD-10-CM | POA: Diagnosis not present

## 2016-07-20 DIAGNOSIS — J411 Mucopurulent chronic bronchitis: Secondary | ICD-10-CM

## 2016-07-20 MED ORDER — BUPROPION HCL ER (SR) 150 MG PO TB12: 150.0000 mg | ORAL_TABLET | Freq: Two times a day (BID) | ORAL | 3 refills | Status: DC

## 2016-07-20 MED FILL — BUPROPION SR 150 MG TABLET: 150 | 30 days supply | Qty: 60 | Fill #0

## 2016-07-20 NOTE — Telephone Encounter (Signed)
Zyban is Bupropion which is what the pt is currently on. I have given the verbal order to [place] at [facility] to change this prescription. Nothing further was needed.

## 2016-07-20 NOTE — Patient Instructions (Signed)
Bupropion 150 mg pill >> 1 pill daily for first 3 days, then 1 pill twice per day.  Set quit date to stop smoking one week after starting bupropion.  Will arrange for home sleep study  Will arrange for follow up after home sleep study reviewed

## 2016-07-20 NOTE — Progress Notes (Signed)
Past Surgical History Lorraine Holt  has a past surgical history that includes Tubal ligation; Dilation and curettage of uterus; Colonoscopy (04/2014); Breast biopsy (Left); Robotic assisted total hysterectomy (N/A, 09/17/2014); Bilateral salpingectomy (Right, 09/17/2014); Tooth extraction (09/30/14); and Abdominal hysterectomy (09/2015).  Allergies  Allergen Reactions  . Penicillins Swelling and Rash  . Amlodipine     Constipation, dizziness    Family History Her family history includes Breast cancer in her maternal aunt, maternal aunt, and mother; Diabetes in her maternal aunt, maternal uncle, and mother; Hypertension in her mother.  Social History Lorraine Holt  reports that Lorraine Holt has been smoking Cigarettes.  Lorraine Holt has a 10.00 pack-year smoking history. Lorraine Holt has never used smokeless tobacco. Lorraine Holt reports that Lorraine Holt does not drink alcohol or use drugs.  Review of systems Constitutional: Negative for fever and unexpected weight change.  HENT: Negative for congestion, dental problem, ear pain, nosebleeds, postnasal drip, rhinorrhea, sinus pressure, sneezing, sore throat and trouble swallowing.   Eyes: Negative for redness and itching.  Respiratory: Positive for cough, shortness of breath, wheezing and stridor. Negative for chest tightness.        Daily in the morning  Cardiovascular: Positive for palpitations and leg swelling.  Gastrointestinal: Positive for nausea. Negative for vomiting.  Genitourinary: Negative for dysuria.  Musculoskeletal: Positive for joint swelling.  Skin: Negative for rash.  Neurological: Positive for headaches.  Hematological: Does not bruise/bleed easily.  Psychiatric/Behavioral: Negative for dysphoric mood. The patient is not nervous/anxious.     Current Outpatient Prescriptions on File Prior to Visit  Medication Sig  . aspirin EC 81 MG tablet Take 1 tablet (81 mg total) by mouth daily.  . ferrous sulfate 325 (65 FE) MG tablet Take 1 tablet (325 mg total) by mouth 2 (two) times daily  with a meal. (Patient taking differently: Take 325 mg by mouth daily with breakfast. )  . glucose blood test strip Test up to twice daily  . hydrochlorothiazide (MICROZIDE) 12.5 MG capsule TAKE 1 CAPSULE BY MOUTH ONCE DAILY  . Indacaterol-Glycopyrrolate (UTIBRON NEOHALER) 27.5-15.6 MCG CAPS Place 1 puff into inhaler and inhale 2 (two) times daily.  Marland Kitchen losartan (COZAAR) 100 MG tablet TAKE 1 TABLET BY MOUTH ONCE DAILY  . lubiprostone (AMITIZA) 24 MCG capsule Take 1 capsule (24 mcg total) by mouth 2 (two) times daily with a meal.  . metFORMIN (GLUCOPHAGE) 500 MG tablet TAKE 1 TABLET BY MOUTH TWICE DAILY WITH MEALS  . naproxen (NAPROSYN) 500 MG tablet Take 1 tablet (500 mg total) by mouth 2 (two) times daily with a meal.  . pravastatin (PRAVACHOL) 10 MG tablet TAKE 1 TABLET BY MOUTH ONCE DAILY  . TRUEPLUS LANCETS 30G MISC USE TWICE DAILY   No current facility-administered medications on file prior to visit.     Chief Complaint  Patient presents with  . SLEEP CONSULT    Referred by Dr Ronnald Ramp. Epworth Score: 9    Past medical history Lorraine Holt  has a past medical history of Anemia; Bronchitis; Carpal tunnel syndrome of right wrist; Diabetes mellitus; Hyperlipidemia; Hypertension; Obesity; Peripheral vascular disease ([place]); Shortness of breath dyspnea; and Tooth abscess (09/30/14).  Vital signs BP 132/82 (BP Location: Left Arm, Cuff Size: Normal)   Pulse 88   Ht 5' 10.5" (1.791 m)   Wt (!) 341 lb (154.7 kg)   LMP 08/08/2014   SpO2 96%   BMI 48.24 kg/m   History of Present Illness Lorraine Holt is a 51 y.o. female for evaluation of sleep problems.  Lorraine Holt was  seen recently by her PCP.  There was concern about her snoring and sleep difficulty.  Her husband has told her that Lorraine Holt snores, and will stop breathing while asleep.  Lorraine Holt will wake up hearing herself snore.  Lorraine Holt goes to sleep at 9 am.  Lorraine Holt falls asleep after an hour.  Lorraine Holt wakes up some times to use the bathroom.  Lorraine Holt gets out of bed at 2 pm.   Lorraine Holt feels tired all the time.  Lorraine Holt denies morning headache.  Lorraine Holt does not use anything to help her fall sleep or stay awake.  Lorraine Holt denies sleep walking, sleep talking, bruxism, or nightmares.  There is no history of restless legs.  Lorraine Holt denies sleep hallucinations, sleep paralysis, or cataplexy.  The Epworth score is 9 out of 24.  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  Lorraine Holt smokes 1/2 ppd.  Lorraine Holt has cough with wheeze, and chest congestion.  Lorraine Holt was started on utibron by PCP and this has helped.  Physical Exam:  General - No distress ENT - No sinus tenderness, no oral exudate, no LAN, no thyromegaly, TM clear, pupils equal/reactive, MP 3, scalloped tongue Cardiac - s1s2 regular, no murmur, pulses symmetric Chest - No wheeze/rales/dullness, good air entry, normal respiratory excursion Back - No focal tenderness Abd - Soft, non-tender, no organomegaly, + bowel sounds Ext - No edema Neuro - Normal strength, cranial nerves intact Skin - No rashes Psych - Normal mood, and behavior   CMP Latest Ref Rng & Units 05/25/2016 05/22/2015 09/18/2014  Glucose 70 - 99 mg/dL 125(H) 136(H) 120(H)  BUN 6 - 23 mg/dL 11 9 15   Creatinine 0.40 - 1.20 mg/dL 0.73 0.75 0.68  Sodium 135 - 145 mEq/L 139 137 136  Potassium 3.5 - 5.1 mEq/L 3.9 3.8 3.8  Chloride 96 - 112 mEq/L 103 102 104  CO2 19 - 32 mEq/L 29 31 27   Calcium 8.4 - 10.5 mg/dL 9.3 8.9 8.2(L)  Total Protein 6.0 - 8.3 g/dL 7.6 7.2 -  Total Bilirubin 0.2 - 1.2 mg/dL 0.5 0.5 -  Alkaline Phos 39 - 117 U/L 130(H) 120(H) -  AST 0 - 37 U/L 12 14 -  ALT 0 - 35 U/L 13 13 -    CBC Latest Ref Rng & Units 05/25/2016 05/22/2015 09/18/2014  WBC 4.0 - 10.5 K/uL 8.2 6.7 12.0(H)  Hemoglobin 12.0 - 15.0 g/dL 15.4(H) 13.5 11.7(L)  Hematocrit 36.0 - 46.0 % 46.2(H) 41.8 36.9  Platelets 150.0 - 400.0 K/uL 217.0 254.0 198    Dg Abd Acute W/chest  Result Date: 05/25/2016 CLINICAL DATA:  Cough. EXAM: DG ABDOMEN ACUTE W/ 1V CHEST COMPARISON:  07/23/2015.  FINDINGS: Mediastinum and hilar structures normal. Lungs are clear of acute infiltrates. Cardiomegaly with normal pulmonary vascularity. No pleural effusion or pneumothorax. Degenerative changes thoracic spine. IMPRESSION: 1.  No acute pulmonary disease. 2. Cardiomegaly. 3. No bowel distention. Prominent amount of stool noted throughout the colon. Electronically Signed   By: Marcello Moores  Register   On: 05/25/2016 10:50    Discussion: Lorraine Holt has snoring, sleep disruption, witnessed apnea, and daytime sleepiness.  Lorraine Holt has hx of HTN and DM.  Her BMI is > 35.  I am concerned Lorraine Holt could have sleep apnea.  We discussed how sleep apnea can affect various health problems, including risks for hypertension, cardiovascular disease, and diabetes.  We also discussed how sleep disruption can increase risks for accidents, such as while driving.  Weight loss as a means of improving  sleep apnea was also reviewed.  Additional treatment options discussed were CPAP therapy, oral appliance, and surgical intervention.  Lorraine Holt also has history of tobacco abuse, and is interesting in quitting.  Lorraine Holt does have cough, dyspnea on exertion and persistent sputum production that has improved with inhaler therapy.  Assessment/plan:  Snoring with concern for obstructive sleep apnea. - will arrange for home sleep study to further assess  Shift work disorder. - discussed sleep hygiene, consolidating sleep, and scheduling napping  Morbid obesity. - discussed importance of weight loss  Tobacco abuse. - Lorraine Holt is willing to try bupropion - reviewed dosing regimen, setting quit date, and side effects to monitor for  Chronic bronchitis. - continue utibron   Patient Instructions  Bupropion 150 mg pill >> 1 pill daily for first 3 days, then 1 pill twice per day.  Set quit date to stop smoking one week after starting bupropion.  Will arrange for home sleep study  Will arrange for follow up after home sleep study reviewed    Chesley Mires, M.D. Pager [phone] 07/20/2016, 9:50 AM

## 2016-07-20 NOTE — Progress Notes (Signed)
   Subjective:    Patient ID: Lorraine Holt, female    DOB: 1966/04/26, 51 y.o.   MRN: [id number]  HPI    Review of Systems  Constitutional: Negative for fever and unexpected weight change.  HENT: Negative for congestion, dental problem, ear pain, nosebleeds, postnasal drip, rhinorrhea, sinus pressure, sneezing, sore throat and trouble swallowing.   Eyes: Negative for redness and itching.  Respiratory: Positive for cough, shortness of breath, wheezing and stridor. Negative for chest tightness.        Daily in the morning  Cardiovascular: Positive for palpitations and leg swelling.  Gastrointestinal: Positive for nausea. Negative for vomiting.  Genitourinary: Negative for dysuria.  Musculoskeletal: Positive for joint swelling.  Skin: Negative for rash.  Neurological: Positive for headaches.  Hematological: Does not bruise/bleed easily.  Psychiatric/Behavioral: Negative for dysphoric mood. The patient is not nervous/anxious.        Objective:   Physical Exam        Assessment & Plan:

## 2016-08-11 DIAGNOSIS — G4733 Obstructive sleep apnea (adult) (pediatric): Secondary | ICD-10-CM | POA: Diagnosis not present

## 2016-08-16 DIAGNOSIS — G4733 Obstructive sleep apnea (adult) (pediatric): Secondary | ICD-10-CM | POA: Diagnosis not present

## 2016-08-17 ENCOUNTER — Other Ambulatory Visit: Payer: Self-pay | Admitting: *Deleted

## 2016-08-17 ENCOUNTER — Telehealth: Payer: Self-pay | Admitting: Pulmonary Disease

## 2016-08-17 DIAGNOSIS — R0683 Snoring: Secondary | ICD-10-CM

## 2016-08-17 NOTE — Telephone Encounter (Signed)
HST 08/11/16 >> AHI 10.8, SaO2 low 67%  Will have my nurse inform pt that sleep study shows mild sleep apnea.  Please arrange for ROV with me or NP to review treatment options.

## 2016-08-19 NOTE — Telephone Encounter (Signed)
Appt scheduled with TP, first available between both physicians, Scheduled 09/01/16 at 9am Nothing further needed.

## 2016-08-26 MED FILL — NAPROXEN 500 MG TABLET: 500 | 30 days supply | Qty: 60 | Fill #2

## 2016-08-26 MED FILL — UTIBRON NEOHALER 27.5-15.6: 27.5-15.6 | 30 days supply | Qty: 60 | Fill #1

## 2016-09-01 ENCOUNTER — Encounter: Payer: Self-pay | Admitting: Adult Health

## 2016-09-01 ENCOUNTER — Ambulatory Visit (INDEPENDENT_AMBULATORY_CARE_PROVIDER_SITE_OTHER): Payer: 59 | Admitting: Adult Health

## 2016-09-01 DIAGNOSIS — G4733 Obstructive sleep apnea (adult) (pediatric): Secondary | ICD-10-CM

## 2016-09-01 NOTE — Patient Instructions (Signed)
Begin CPAP At bedtime  .  Goal is to wear for at least 4hr each night  Work on weight loss.  Do not drive if sleepy .  follow up Dr. Halford Chessman  In 2 months and As needed

## 2016-09-01 NOTE — Assessment & Plan Note (Signed)
Wt loss  

## 2016-09-01 NOTE — Assessment & Plan Note (Signed)
Mild OSA  Pt education given on OSA and tx options  Wt loss is key   Plan  Patient Instructions  Begin CPAP At bedtime  .  Goal is to wear for at least 4hr each night  Work on weight loss.  Do not drive if sleepy .  follow up Dr. Halford Chessman  In 2 months and As needed

## 2016-09-01 NOTE — Progress Notes (Signed)
@Patient  ID: Lorraine Holt, female    DOB: 09-Nov-1965, 51 y.o.   MRN: [id number]  Chief Complaint  Patient presents with  . Follow-up    OSA    Referring provider: Janith Lima, MD  HPI: 51 yo female smoker seen for sleep consult 07/2016 found to have mild OSA .   TEST  HST 08/11/16 >> AHI 10.8, SaO2 low 67%  09/01/2016 Follow Up : OSA  Pt returns for a 1 month follow up for sleep apnea. Patient was seen for sleep consult in February for daytime sleepiness. For home sleep study done on 08/11/2016 that showed mild sleep apnea with AHI  at 10.8 and SaO2 low at 67% We discussed underlying sleep apnea and potential complications. We went over treatment options including weight loss, oral appliance and C Pap. Patient like to proceed with C Pap.  Allergies  Allergen Reactions  . Penicillins Swelling and Rash  . Amlodipine     Constipation, dizziness    Immunization History  Administered Date(s) Administered  . Influenza Split 03/08/2012  . Influenza-Unspecified 05/02/2013, 02/28/2015, 03/10/2016  . Pneumococcal Polysaccharide-23 01/07/2014    Past Medical History:  Diagnosis Date  . Anemia    history age 17'S  . Bronchitis   . Carpal tunnel syndrome of right wrist   . Diabetes mellitus   . Hyperlipidemia   . Hypertension   . Obesity   . Peripheral vascular disease ([facility])    undiagnosis, but complains of numbness in legs  . Shortness of breath dyspnea    with exertion  . Tooth abscess 09/30/14   tooth was extracted    Tobacco History: History  Smoking Status  . Current Every Day Smoker  . Packs/day: 0.50  . Years: 20.00  . Types: Cigarettes  Smokeless Tobacco  . Never Used   Ready to quit: No Counseling given: Not Answered   Outpatient Encounter Prescriptions as of 09/01/2016  Medication Sig  . aspirin EC 81 MG tablet Take 1 tablet (81 mg total) by mouth daily.  Marland Kitchen buPROPion (WELLBUTRIN SR) 150 MG 12 hr tablet Take 1 tablet (150 mg total) by mouth 2 (two)  times daily.  . ferrous sulfate 325 (65 FE) MG tablet Take 1 tablet (325 mg total) by mouth 2 (two) times daily with a meal. (Patient taking differently: Take 325 mg by mouth daily with breakfast. )  . glucose blood test strip Test up to twice daily  . hydrochlorothiazide (MICROZIDE) 12.5 MG capsule TAKE 1 CAPSULE BY MOUTH ONCE DAILY  . Indacaterol-Glycopyrrolate (UTIBRON NEOHALER) 27.5-15.6 MCG CAPS Place 1 puff into inhaler and inhale 2 (two) times daily.  Marland Kitchen losartan (COZAAR) 100 MG tablet TAKE 1 TABLET BY MOUTH ONCE DAILY  . metFORMIN (GLUCOPHAGE) 500 MG tablet TAKE 1 TABLET BY MOUTH TWICE DAILY WITH MEALS  . naproxen (NAPROSYN) 500 MG tablet Take 1 tablet (500 mg total) by mouth 2 (two) times daily with a meal.  . pravastatin (PRAVACHOL) 10 MG tablet TAKE 1 TABLET BY MOUTH ONCE DAILY  . TRUEPLUS LANCETS 30G MISC USE TWICE DAILY  . [DISCONTINUED] lubiprostone (AMITIZA) 24 MCG capsule Take 1 capsule (24 mcg total) by mouth 2 (two) times daily with a meal. (Patient not taking: Reported on 09/01/2016)   No facility-administered encounter medications on file as of 09/01/2016.      Review of Systems  Constitutional:   No  weight loss, night sweats,  Fevers, chills,  +fatigue, or  lassitude.  HEENT:   No headaches,  Difficulty swallowing,  Tooth/dental problems, or  Sore throat,                No sneezing, itching, ear ache, nasal congestion, post nasal drip,   CV:  No chest pain,  Orthopnea, PND, swelling in lower extremities, anasarca, dizziness, palpitations, syncope.   GI  No heartburn, indigestion, abdominal pain, nausea, vomiting, diarrhea, change in bowel habits, loss of appetite, bloody stools.   Resp: No shortness of breath with exertion or at rest.  No excess mucus, no productive cough,  No non-productive cough,  No coughing up of blood.  No change in color of mucus.  No wheezing.  No chest wall deformity  Skin: no rash or lesions.  GU: no dysuria, change in color of urine, no  urgency or frequency.  No flank pain, no hematuria   MS:  No joint pain or swelling.  No decreased range of motion.  No back pain.    Physical Exam  BP 128/76 (BP Location: Left Arm, Cuff Size: Large)   Pulse 86   Ht 5' 10.5" (1.791 m)   Wt (!) 341 lb (154.7 kg)   LMP 08/08/2014   SpO2 98%   BMI 48.24 kg/m   GEN: A/Ox3; pleasant , NAD, obese    HEENT:  [place]/AT,  EACs-clear, TMs-wnl, NOSE-clear, THROAT-clear, no lesions, no postnasal drip or exudate noted. Class 2-3 MP airway   NECK:  Supple w/ fair ROM; no JVD; normal carotid impulses w/o bruits; no thyromegaly or nodules palpated; no lymphadenopathy.    RESP  Clear  P & A; w/o, wheezes/ rales/ or rhonchi. no accessory muscle use, no dullness to percussion  CARD:  RRR, no m/r/g, no peripheral edema, pulses intact, no cyanosis or clubbing.  GI:   Soft & nt; nml bowel sounds; no organomegaly or masses detected.   Musco: Warm bil, no deformities or joint swelling noted.   Neuro: alert, no focal deficits noted.    Skin: Warm, no lesions or rashes    Lab Results:    BNP No results found for: BNP   Imaging: No results found.   Assessment & Plan:   OSA (obstructive sleep apnea) Mild OSA  Pt education given on OSA and tx options  Wt loss is key   Plan  Patient Instructions  Begin CPAP At bedtime  .  Goal is to wear for at least 4hr each night  Work on weight loss.  Do not drive if sleepy .  follow up Dr. Halford Chessman  In 2 months and As needed       Severe obesity (BMI >= 40) Wt loss      Rexene Edison, NP 09/01/2016

## 2016-09-02 NOTE — Progress Notes (Signed)
I have reviewed and agree with assessment/plan.  Chesley Mires, MD [facility]/Critical [facility] 09/02/2016, 1:56 AM Pager:  [phone]

## 2016-09-10 ENCOUNTER — Telehealth: Payer: Self-pay | Admitting: Adult Health

## 2016-09-10 DIAGNOSIS — G4733 Obstructive sleep apnea (adult) (pediatric): Secondary | ICD-10-CM

## 2016-09-10 NOTE — Telephone Encounter (Signed)
Spoke with the pt  She states calling to check on CPAP order  Per TP's last ov:      Instructions      Return in about 2 months (around 11/01/2016) for Follow up Dr. Halford Chessman.  Begin CPAP At bedtime  .  Goal is to wear for at least 4hr each night  Work on weight loss.  Do not drive if sleepy .  follow up Dr. Halford Chessman  In 2 months and As needed     Looks like we never sent an order for her CPAP though  I apologized to her for this and advised will send urgent msg to TP asking for pressure settings so we can send order She wants a call once order is placed and ok to leave her a detailed msg  TP- please advise on CPAP settings thanks

## 2016-09-10 NOTE — Telephone Encounter (Signed)
So sorry the order was not placed!  Will do so now

## 2016-09-10 NOTE — Telephone Encounter (Signed)
LMTCB

## 2016-09-13 NOTE — Telephone Encounter (Signed)
Called and left detailed message for pt informing her that the order was placed and to call back if needed. Will sign off.

## 2016-09-20 DIAGNOSIS — G4733 Obstructive sleep apnea (adult) (pediatric): Secondary | ICD-10-CM | POA: Diagnosis not present

## 2016-09-28 ENCOUNTER — Other Ambulatory Visit: Payer: Self-pay | Admitting: Internal Medicine

## 2016-09-28 ENCOUNTER — Encounter: Payer: Self-pay | Admitting: Internal Medicine

## 2016-09-28 ENCOUNTER — Ambulatory Visit (INDEPENDENT_AMBULATORY_CARE_PROVIDER_SITE_OTHER): Payer: Self-pay | Admitting: Internal Medicine

## 2016-09-28 ENCOUNTER — Other Ambulatory Visit (INDEPENDENT_AMBULATORY_CARE_PROVIDER_SITE_OTHER): Payer: 59

## 2016-09-28 VITALS — BP 150/100 | HR 88 | Temp 98.4°F | Resp 16 | Ht 70.5 in | Wt 342.0 lb

## 2016-09-28 DIAGNOSIS — E118 Type 2 diabetes mellitus with unspecified complications: Secondary | ICD-10-CM | POA: Diagnosis not present

## 2016-09-28 DIAGNOSIS — I10 Essential (primary) hypertension: Secondary | ICD-10-CM

## 2016-09-28 DIAGNOSIS — Z0289 Encounter for other administrative examinations: Secondary | ICD-10-CM

## 2016-09-28 DIAGNOSIS — R002 Palpitations: Secondary | ICD-10-CM

## 2016-09-28 LAB — CBC WITH DIFFERENTIAL/PLATELET
Basophils Absolute: 0.1 10*3/uL (ref 0.0–0.1)
Basophils Relative: 1.2 % (ref 0.0–3.0)
EOS PCT: 3.8 % (ref 0.0–5.0)
Eosinophils Absolute: 0.2 10*3/uL (ref 0.0–0.7)
HCT: 44.3 % (ref 36.0–46.0)
HEMOGLOBIN: 14.5 g/dL (ref 12.0–15.0)
LYMPHS PCT: 32.8 % (ref 12.0–46.0)
Lymphs Abs: 1.9 10*3/uL (ref 0.7–4.0)
MCHC: 32.7 g/dL (ref 30.0–36.0)
MCV: 87.8 fl (ref 78.0–100.0)
MONOS PCT: 7.4 % (ref 3.0–12.0)
Monocytes Absolute: 0.4 10*3/uL (ref 0.1–1.0)
Neutro Abs: 3.2 10*3/uL (ref 1.4–7.7)
Neutrophils Relative %: 54.8 % (ref 43.0–77.0)
Platelets: 263 10*3/uL (ref 150.0–400.0)
RBC: 5.05 Mil/uL (ref 3.87–5.11)
RDW: 14.4 % (ref 11.5–15.5)
WBC: 5.8 10*3/uL (ref 4.0–10.5)

## 2016-09-28 LAB — BASIC METABOLIC PANEL
BUN: 9 mg/dL (ref 6–23)
CALCIUM: 9.5 mg/dL (ref 8.4–10.5)
CHLORIDE: 102 meq/L (ref 96–112)
CO2: 30 meq/L (ref 19–32)
CREATININE: 0.74 mg/dL (ref 0.40–1.20)
GFR: 105.49 mL/min (ref >60.00)
GLUCOSE: 154 mg/dL — AB (ref 70–99)
Potassium: 3.9 mEq/L (ref 3.5–5.1)
Sodium: 137 mEq/L (ref 135–145)

## 2016-09-28 LAB — HEMOGLOBIN A1C: Hgb A1c MFr Bld: 7.6 % — ABNORMAL HIGH (ref 4.6–6.5)

## 2016-09-28 MED ORDER — NEBIVOLOL HCL 5 MG PO TABS: 5.0000 mg | ORAL_TABLET | Freq: Every day | ORAL | 0 refills | Status: DC

## 2016-09-28 MED FILL — NAPROXEN 500 MG TABLET: 500 | 30 days supply | Qty: 60 | Fill #3

## 2016-09-28 MED FILL — LOSARTAN POTASSIUM 100 MG T: 100 | 90 days supply | Qty: 90 | Fill #1

## 2016-09-28 MED FILL — ACCU-CHEK GUIDE TEST STRIP: 90 days supply | Qty: 200 | Fill #0

## 2016-09-28 MED FILL — BUPROPION SR 150 MG TABLET: 150 | 30 days supply | Qty: 60 | Fill #1

## 2016-09-28 MED FILL — metFORMIN HCL 500 MG TABS: 500 | 90 days supply | Qty: 180 | Fill #1

## 2016-09-28 NOTE — Progress Notes (Signed)
Subjective:  Patient ID: Lorraine Holt, female    DOB: 1965-10-25  Age: 51 y.o. MRN: [id number]  CC: Palpitations; Hypertension; and Diabetes   HPI Lorraine Holt presents for f/up - She complains of a one-week history of intermittent episodes of palpitations that she describes as her heart racing. She has had one episode of dizziness. She says the symptoms occur while at rest. She denies CP, DOE, edema, fatigue, presyncope.  Outpatient Medications Prior to Visit  Medication Sig Dispense Refill  . aspirin EC 81 MG tablet Take 1 tablet (81 mg total) by mouth daily. 90 tablet 3  . buPROPion (WELLBUTRIN SR) 150 MG 12 hr tablet Take 1 tablet (150 mg total) by mouth 2 (two) times daily. 60 tablet 3  . ferrous sulfate 325 (65 FE) MG tablet Take 1 tablet (325 mg total) by mouth 2 (two) times daily with a meal. (Patient taking differently: Take 325 mg by mouth daily with breakfast. ) 180 tablet 3  . hydrochlorothiazide (MICROZIDE) 12.5 MG capsule TAKE 1 CAPSULE BY MOUTH ONCE DAILY 90 capsule 1  . Indacaterol-Glycopyrrolate (UTIBRON NEOHALER) 27.5-15.6 MCG CAPS Place 1 puff into inhaler and inhale 2 (two) times daily. 60 capsule 11  . losartan (COZAAR) 100 MG tablet TAKE 1 TABLET BY MOUTH ONCE DAILY 90 tablet 1  . metFORMIN (GLUCOPHAGE) 500 MG tablet TAKE 1 TABLET BY MOUTH TWICE DAILY WITH MEALS 180 tablet 1  . naproxen (NAPROSYN) 500 MG tablet Take 1 tablet (500 mg total) by mouth 2 (two) times daily with a meal. 60 tablet 3  . pravastatin (PRAVACHOL) 10 MG tablet TAKE 1 TABLET BY MOUTH ONCE DAILY 90 tablet 1  . TRUE METRIX BLOOD GLUCOSE TEST test strip USE TO TEST UP TO TWICE DAILY 100 each 11  . TRUEPLUS LANCETS 30G MISC USE TWICE DAILY 100 each 11   No facility-administered medications prior to visit.     ROS Review of Systems  Constitutional: Negative for activity change, diaphoresis, fatigue and unexpected weight change.  HENT: Negative.  Negative for trouble swallowing.   Eyes: Negative.    Respiratory: Positive for apnea. Negative for cough, chest tightness, shortness of breath and wheezing.   Cardiovascular: Positive for palpitations. Negative for chest pain and leg swelling.  Gastrointestinal: Negative for abdominal pain, constipation, diarrhea, nausea and vomiting.  Endocrine: Negative.  Negative for polydipsia, polyphagia and polyuria.  Genitourinary: Negative.   Musculoskeletal: Negative.  Negative for back pain and neck pain.  Skin: Negative.  Negative for color change and rash.  Allergic/Immunologic: Negative.   Neurological: Positive for dizziness. Negative for syncope, weakness, numbness and headaches.  Hematological: Does not bruise/bleed easily.  Psychiatric/Behavioral: Negative.     Objective:  BP (!) 150/100 (BP Location: Left Arm, Patient Position: Sitting, Cuff Size: Large)   Pulse 88   Temp 98.4 F (36.9 C) (Oral)   Resp 16   Ht 5' 10.5" (1.791 m)   Wt (!) 342 lb (155.1 kg)   LMP 08/08/2014   SpO2 98%   BMI 48.38 kg/m   BP Readings from Last 3 Encounters:  09/28/16 (!) 150/100  09/01/16 128/76  07/20/16 132/82    Wt Readings from Last 3 Encounters:  09/28/16 (!) 342 lb (155.1 kg)  09/01/16 (!) 341 lb (154.7 kg)  07/20/16 (!) 341 lb (154.7 kg)    Physical Exam  Constitutional: She is oriented to person, place, and time. No distress.  HENT:  Mouth/Throat: Oropharynx is clear and moist. No oropharyngeal exudate.  Eyes: Conjunctivae are normal. Right eye exhibits no discharge. Left eye exhibits no discharge. No scleral icterus.  Neck: Normal range of motion. Neck supple. No JVD present. No tracheal deviation present. No thyromegaly present.  Cardiovascular: Normal rate, regular rhythm, normal heart sounds and intact distal pulses.  Exam reveals no gallop and no friction rub.   No murmur heard. EKG --  NSR, no arrhythmia, intervals are all normal  No change compared to prior EKG  Pulmonary/Chest: Effort normal and breath sounds normal. No  stridor. No respiratory distress. She has no wheezes. She has no rales. She exhibits no tenderness.  Abdominal: Soft. Bowel sounds are normal. She exhibits no distension and no mass. There is no tenderness. There is no rebound and no guarding.  Musculoskeletal: Normal range of motion. She exhibits no edema, tenderness or deformity.  Lymphadenopathy:    She has no cervical adenopathy.  Neurological: She is oriented to person, place, and time.  Skin: Skin is warm and dry. No rash noted. She is not diaphoretic. No erythema. No pallor.  Vitals reviewed.   Lab Results  Component Value Date   WBC 5.8 09/28/2016   HGB 14.5 09/28/2016   HCT 44.3 09/28/2016   PLT 263.0 09/28/2016   GLUCOSE 154 (H) 09/28/2016   CHOL 169 05/25/2016   TRIG 88.0 05/25/2016   HDL 43.70 05/25/2016   LDLCALC 108 (H) 05/25/2016   ALT 13 05/25/2016   AST 12 05/25/2016   NA 137 09/28/2016   K 3.9 09/28/2016   CL 102 09/28/2016   CREATININE 0.74 09/28/2016   BUN 9 09/28/2016   CO2 30 09/28/2016   TSH 2.56 05/25/2016   HGBA1C 7.6 (H) 09/28/2016   MICROALBUR <0.7 05/25/2016    Dg Abd Acute W/chest  Result Date: 05/25/2016 CLINICAL DATA:  Cough. EXAM: DG ABDOMEN ACUTE W/ 1V CHEST COMPARISON:  07/23/2015. FINDINGS: Mediastinum and hilar structures normal. Lungs are clear of acute infiltrates. Cardiomegaly with normal pulmonary vascularity. No pleural effusion or pneumothorax. Degenerative changes thoracic spine. IMPRESSION: 1.  No acute pulmonary disease. 2. Cardiomegaly. 3. No bowel distention. Prominent amount of stool noted throughout the colon. Electronically Signed   By: Marcello Holt  Register   On: 05/25/2016 10:50    Assessment & Plan:   Lorraine was seen today for palpitations, hypertension and diabetes.  Diagnoses and all orders for this visit:  Essential hypertension, benign- Her blood pressure is not adequately well controlled so will add nebivolol to her current regimen, her labs are negative for any  secondary causes or end organ damage. -     CBC with Differential/Platelet; Future -     Basic metabolic panel; Future -     nebivolol (BYSTOLIC) 5 MG tablet; Take 1 tablet (5 mg total) by mouth daily.  Type 2 diabetes mellitus with complication, without long-term current use of insulin ([place])- her A1c is 7.6%, considering her comorbid illnesses her blood sugars are adequately well controlled. -     Basic metabolic panel; Future -     Hemoglobin A1c; Future  Palpitations- her palpitations sound benign issues had a few episodes of dizziness, her EKG is normal today, I've asked her to undergo an event monitor to screen for tachycardia dysrhythmias such as SVT, atrial fibrillation, atrial flutter -     Cardiac event monitor; Future -     EKG 12-Lead   I am having Ms. Exley start on nebivolol. I am also having her maintain her TRUEPLUS LANCETS 30G, aspirin EC, ferrous sulfate, pravastatin,  naproxen, Indacaterol-Glycopyrrolate, hydrochlorothiazide, metFORMIN, losartan, buPROPion, and TRUE METRIX BLOOD GLUCOSE TEST.  Meds ordered this encounter  Medications  . nebivolol (BYSTOLIC) 5 MG tablet    Sig: Take 1 tablet (5 mg total) by mouth daily.    Dispense:  56 tablet    Refill:  0     Follow-up: Return in about 6 weeks (around 11/09/2016).  Scarlette Calico, MD

## 2016-09-28 NOTE — Progress Notes (Signed)
Pre visit review using our clinic review tool, if applicable. No additional management support is needed unless otherwise documented below in the visit note. 

## 2016-09-28 NOTE — Patient Instructions (Signed)

## 2016-10-06 ENCOUNTER — Ambulatory Visit (INDEPENDENT_AMBULATORY_CARE_PROVIDER_SITE_OTHER): Payer: 59

## 2016-10-06 DIAGNOSIS — R002 Palpitations: Secondary | ICD-10-CM

## 2016-10-19 ENCOUNTER — Telehealth: Payer: Self-pay | Admitting: Internal Medicine

## 2016-10-19 DIAGNOSIS — G4733 Obstructive sleep apnea (adult) (pediatric): Secondary | ICD-10-CM

## 2016-10-19 NOTE — Telephone Encounter (Signed)
Pt called in and said that we need to send an order over to [facility] to cancel the Cpap Machine.  Fax number [phone]

## 2016-10-20 NOTE — Telephone Encounter (Signed)
Order has been entered to DC.   Order needs to be cosigned by PCP. PCP will return on Monday May 21st, 2018.

## 2016-10-28 ENCOUNTER — Encounter: Payer: Self-pay | Admitting: *Deleted

## 2016-10-28 NOTE — Progress Notes (Signed)
Patient ID: Lorraine Holt, female   DOB: 08/26/1965, 51 y.o.   MRN: [id number] Patient called in needing additional electrode strips for her cardiac event monitor.  A package of 5 strips will be left at the check in desk for the patient.

## 2016-11-10 ENCOUNTER — Other Ambulatory Visit: Payer: Self-pay | Admitting: Internal Medicine

## 2016-11-10 DIAGNOSIS — M170 Bilateral primary osteoarthritis of knee: Secondary | ICD-10-CM

## 2016-11-10 MED FILL — NAPROXEN 500 MG TABLET: 500 | 30 days supply | Qty: 60 | Fill #0

## 2016-11-19 ENCOUNTER — Encounter: Payer: Self-pay | Admitting: Internal Medicine

## 2016-11-19 DIAGNOSIS — H52223 Regular astigmatism, bilateral: Secondary | ICD-10-CM | POA: Diagnosis not present

## 2016-11-19 DIAGNOSIS — E119 Type 2 diabetes mellitus without complications: Secondary | ICD-10-CM | POA: Diagnosis not present

## 2016-11-19 DIAGNOSIS — H524 Presbyopia: Secondary | ICD-10-CM | POA: Diagnosis not present

## 2016-12-16 ENCOUNTER — Other Ambulatory Visit: Payer: Self-pay | Admitting: Internal Medicine

## 2016-12-16 MED FILL — metFORMIN HCL 500 MG TABS: 500 | 90 days supply | Qty: 180 | Fill #0

## 2016-12-16 MED FILL — NAPROXEN 500 MG TABLET: 500 | 30 days supply | Qty: 60 | Fill #1

## 2017-01-13 ENCOUNTER — Other Ambulatory Visit: Payer: Self-pay | Admitting: Internal Medicine

## 2017-01-13 MED FILL — HYDROCHLOROTHIAZIDE 12.5 MG: 12.5 | 90 days supply | Qty: 90 | Fill #1

## 2017-01-13 MED FILL — ACCU-CHEK GUIDE TEST STRIP: 90 days supply | Qty: 200 | Fill #1

## 2017-01-13 MED FILL — NAPROXEN 500 MG TABLET: 500 | 30 days supply | Qty: 60 | Fill #2

## 2017-01-13 MED FILL — PRAVASTATIN NA 10 MG TAB: 10 | 90 days supply | Qty: 90 | Fill #0

## 2017-01-13 MED FILL — LOSARTAN POTASSIUM 100 MG T: 100 | 90 days supply | Qty: 90 | Fill #0

## 2017-01-25 DIAGNOSIS — Z803 Family history of malignant neoplasm of breast: Secondary | ICD-10-CM | POA: Diagnosis not present

## 2017-01-25 DIAGNOSIS — Z1231 Encounter for screening mammogram for malignant neoplasm of breast: Secondary | ICD-10-CM | POA: Diagnosis not present

## 2017-01-25 LAB — HM MAMMOGRAPHY

## 2017-02-23 MED FILL — NAPROXEN 500 MG TABLET: 500 | 30 days supply | Qty: 60 | Fill #3

## 2017-03-28 ENCOUNTER — Telehealth: Payer: Self-pay | Admitting: Internal Medicine

## 2017-03-28 DIAGNOSIS — M170 Bilateral primary osteoarthritis of knee: Secondary | ICD-10-CM

## 2017-03-29 MED ORDER — METFORMIN HCL 500 MG PO TABS: 500.0000 mg | ORAL_TABLET | Freq: Two times a day (BID) | ORAL | 0 refills | Status: DC

## 2017-03-29 MED ORDER — NAPROXEN 500 MG PO TABS: ORAL_TABLET | ORAL | 0 refills | Status: DC

## 2017-03-29 MED FILL — metFORMIN HCL 500 MG TABS: 500 | 30 days supply | Qty: 60 | Fill #0

## 2017-03-29 MED FILL — NAPROXEN 500 MG TABLET: 500 | 30 days supply | Qty: 60 | Fill #0

## 2017-03-29 NOTE — Telephone Encounter (Signed)
Per office policy sent 30 day to local pharmacy until appt.../lmb  

## 2017-03-29 NOTE — Telephone Encounter (Signed)
Pt called reagrding this,  appt made for 11/12, please and send in

## 2017-03-29 NOTE — Addendum Note (Signed)
Addended by: Earnstine Regal on: 03/29/2017 04:05 PM   Modules accepted: Orders

## 2017-04-18 ENCOUNTER — Other Ambulatory Visit: Payer: Self-pay | Admitting: Internal Medicine

## 2017-04-18 ENCOUNTER — Ambulatory Visit (INDEPENDENT_AMBULATORY_CARE_PROVIDER_SITE_OTHER): Payer: 59 | Admitting: Internal Medicine

## 2017-04-18 ENCOUNTER — Encounter: Payer: Self-pay | Admitting: Internal Medicine

## 2017-04-18 ENCOUNTER — Other Ambulatory Visit (INDEPENDENT_AMBULATORY_CARE_PROVIDER_SITE_OTHER): Payer: 59

## 2017-04-18 VITALS — BP 158/102 | HR 81 | Temp 98.8°F | Resp 16 | Wt 325.0 lb

## 2017-04-18 DIAGNOSIS — G5713 Meralgia paresthetica, bilateral lower limbs: Secondary | ICD-10-CM

## 2017-04-18 DIAGNOSIS — I10 Essential (primary) hypertension: Secondary | ICD-10-CM

## 2017-04-18 DIAGNOSIS — E785 Hyperlipidemia, unspecified: Secondary | ICD-10-CM

## 2017-04-18 DIAGNOSIS — E118 Type 2 diabetes mellitus with unspecified complications: Secondary | ICD-10-CM | POA: Diagnosis not present

## 2017-04-18 DIAGNOSIS — M170 Bilateral primary osteoarthritis of knee: Secondary | ICD-10-CM

## 2017-04-18 LAB — CBC WITH DIFFERENTIAL/PLATELET
BASOS ABS: 0.1 10*3/uL (ref 0.0–0.1)
BASOS PCT: 1.4 % (ref 0.0–3.0)
EOS ABS: 0.2 10*3/uL (ref 0.0–0.7)
Eosinophils Relative: 3.2 % (ref 0.0–5.0)
HCT: 45.3 % (ref 36.0–46.0)
Hemoglobin: 14.8 g/dL (ref 12.0–15.0)
LYMPHS ABS: 2.3 10*3/uL (ref 0.7–4.0)
Lymphocytes Relative: 30 % (ref 12.0–46.0)
MCHC: 32.6 g/dL (ref 30.0–36.0)
MCV: 91.1 fl (ref 78.0–100.0)
MONO ABS: 0.6 10*3/uL (ref 0.1–1.0)
Monocytes Relative: 7.4 % (ref 3.0–12.0)
NEUTROS ABS: 4.4 10*3/uL (ref 1.4–7.7)
NEUTROS PCT: 58 % (ref 43.0–77.0)
PLATELETS: 256 10*3/uL (ref 150.0–400.0)
RBC: 4.98 Mil/uL (ref 3.87–5.11)
RDW: 14.2 % (ref 11.5–15.5)
WBC: 7.6 10*3/uL (ref 4.0–10.5)

## 2017-04-18 LAB — HEMOGLOBIN A1C: Hgb A1c MFr Bld: 6.9 % — ABNORMAL HIGH (ref 4.6–6.5)

## 2017-04-18 MED ORDER — GLUCOSE BLOOD VI STRP: ORAL_STRIP | 11 refills | Status: DC

## 2017-04-18 MED ORDER — TELMISARTAN 80 MG PO TABS: 80.0000 mg | ORAL_TABLET | Freq: Every day | ORAL | 0 refills | Status: DC

## 2017-04-18 MED ORDER — NEBIVOLOL HCL 10 MG PO TABS: 10.0000 mg | ORAL_TABLET | Freq: Every day | ORAL | 0 refills | Status: DC

## 2017-04-18 MED ORDER — ALCOHOL SWABS PADS: MEDICATED_PAD | 11 refills | Status: DC

## 2017-04-18 MED ORDER — TRUEPLUS LANCETS 30G MISC: 11 refills | Status: AC

## 2017-04-18 MED ORDER — HYDROCHLOROTHIAZIDE 12.5 MG PO CAPS: 12.5000 mg | ORAL_CAPSULE | Freq: Every day | ORAL | 1 refills | Status: DC

## 2017-04-18 MED ORDER — CHLORTHALIDONE 25 MG PO TABS: 25.0000 mg | ORAL_TABLET | Freq: Every day | ORAL | 0 refills | Status: DC

## 2017-04-18 MED ORDER — METFORMIN HCL 500 MG PO TABS: 500.0000 mg | ORAL_TABLET | Freq: Two times a day (BID) | ORAL | 1 refills | Status: DC

## 2017-04-18 MED ORDER — PRAVASTATIN SODIUM 10 MG PO TABS: 10.0000 mg | ORAL_TABLET | Freq: Every day | ORAL | 1 refills | Status: DC

## 2017-04-18 MED ORDER — LOSARTAN POTASSIUM 100 MG PO TABS: 100.0000 mg | ORAL_TABLET | Freq: Every day | ORAL | 1 refills | Status: DC

## 2017-04-18 NOTE — Patient Instructions (Signed)

## 2017-04-18 NOTE — Progress Notes (Signed)
Subjective:  Patient ID: Lorraine Holt, female    DOB: [birth year]  Age: 51 y.o. MRN: [id number]  CC: Hypertension; Diabetes; and Hyperlipidemia   HPI Lorraine Holt presents for follow-up.  She complains of a several week history of headache, dizziness, and lightheadedness.  She tells me her blood pressure has not been well controlled.  She also complains of a 2-year history of intermittent but worsening pain, numbness, tingling over both thighs.  She denies low back pain and tells me the symptoms do not extend below her knees.  Outpatient Medications Prior to Visit  Medication Sig Dispense Refill  . aspirin EC 81 MG tablet Take 1 tablet (81 mg total) by mouth daily. 90 tablet 3  . buPROPion (WELLBUTRIN SR) 150 MG 12 hr tablet Take 1 tablet (150 mg total) by mouth 2 (two) times daily. 60 tablet 3  . ferrous sulfate 325 (65 FE) MG tablet Take 1 tablet (325 mg total) by mouth 2 (two) times daily with a meal. (Patient taking differently: Take 325 mg by mouth daily with breakfast. ) 180 tablet 3  . naproxen (NAPROSYN) 500 MG tablet TAKE 1 TABLET BY MOUTH 2 TIMES DAILY WITH A MEAL. 60 tablet 0  . hydrochlorothiazide (MICROZIDE) 12.5 MG capsule TAKE 1 CAPSULE BY MOUTH ONCE DAILY 90 capsule 1  . losartan (COZAAR) 100 MG tablet TAKE 1 TABLET BY MOUTH ONCE DAILY 90 tablet 0  . metFORMIN (GLUCOPHAGE) 500 MG tablet Take 1 tablet (500 mg total) by mouth 2 (two) times daily with a meal. Must keep appt in Nov for future refills 60 tablet 0  . nebivolol (BYSTOLIC) 5 MG tablet Take 1 tablet (5 mg total) by mouth daily. 56 tablet 0  . pravastatin (PRAVACHOL) 10 MG tablet TAKE 1 TABLET BY MOUTH ONCE DAILY 90 tablet 0  . TRUE METRIX BLOOD GLUCOSE TEST test strip USE TO TEST UP TO TWICE DAILY 100 each 11  . TRUEPLUS LANCETS 30G MISC USE TWICE DAILY 100 each 11  . Indacaterol-Glycopyrrolate (UTIBRON NEOHALER) 27.5-15.6 MCG CAPS Place 1 puff into inhaler and inhale 2 (two) times daily. 60 capsule 11   No  facility-administered medications prior to visit.     ROS Review of Systems  Constitutional: Negative.  Negative for appetite change, diaphoresis, fatigue and unexpected weight change.  HENT: Negative.   Eyes: Negative for visual disturbance.  Respiratory: Negative.  Negative for cough, chest tightness, shortness of breath and wheezing.   Cardiovascular: Negative for chest pain, palpitations and leg swelling.  Gastrointestinal: Negative for abdominal pain, constipation, diarrhea, nausea and vomiting.  Endocrine: Negative.  Negative for polydipsia, polyphagia and polyuria.  Genitourinary: Negative.  Negative for decreased urine volume, difficulty urinating, hematuria and urgency.  Musculoskeletal: Positive for myalgias (thighs). Negative for arthralgias, back pain and neck pain.  Skin: Negative.  Negative for color change and rash.  Allergic/Immunologic: Negative.   Neurological: Positive for dizziness, light-headedness, numbness and headaches. Negative for weakness.  Hematological: Negative for adenopathy. Does not bruise/bleed easily.  Psychiatric/Behavioral: Negative.     Objective:  BP (!) 158/102   Pulse 81   Temp 98.8 F (37.1 C) (Oral)   Resp 16   Wt (!) 325 lb (147.4 kg)   LMP 08/08/2014   SpO2 98%   BMI 45.97 kg/m   BP Readings from Last 3 Encounters:  04/18/17 (!) 158/102  09/28/16 (!) 150/100  09/01/16 128/76    Wt Readings from Last 3 Encounters:  04/18/17 (!) 325 lb (147.4 kg)  09/28/16 (!) 342 lb (155.1 kg)  09/01/16 (!) 341 lb (154.7 kg)    Physical Exam  Constitutional: She is oriented to person, place, and time. No distress.  HENT:  Mouth/Throat: Oropharynx is clear and moist. No oropharyngeal exudate.  Neck: Normal range of motion. Neck supple. No JVD present. No thyromegaly present.  Cardiovascular: Normal rate, regular rhythm and normal heart sounds. Exam reveals no gallop.  No murmur heard. Pulmonary/Chest: Effort normal and breath sounds  normal. No respiratory distress. She has no rales.  Abdominal: Soft. Bowel sounds are normal. She exhibits no distension and no mass. There is no tenderness. There is no rebound and no guarding.  Musculoskeletal: Normal range of motion. She exhibits no edema, tenderness or deformity.  Neurological: She is alert and oriented to person, place, and time. She has normal reflexes. She displays normal reflexes. No cranial nerve deficit. She exhibits normal muscle tone. Coordination normal.  Skin: Skin is warm and dry. No rash noted. She is not diaphoretic. No erythema. No pallor.  Vitals reviewed.   Lab Results  Component Value Date   WBC 7.6 04/18/2017   HGB 14.8 04/18/2017   HCT 45.3 04/18/2017   PLT 256.0 04/18/2017   GLUCOSE 103 (H) 04/18/2017   CHOL 176 04/18/2017   TRIG 92.0 04/18/2017   HDL 43.00 04/18/2017   LDLCALC 114 (H) 04/18/2017   ALT 11 04/18/2017   AST 11 04/18/2017   NA 137 04/18/2017   K 4.3 04/18/2017   CL 103 04/18/2017   CREATININE 0.71 04/18/2017   BUN 12 04/18/2017   CO2 30 04/18/2017   TSH 4.07 04/18/2017   HGBA1C 6.9 (H) 04/18/2017   MICROALBUR 1.0 04/18/2017    Dg Abd Acute W/chest  Result Date: 05/25/2016 CLINICAL DATA:  Cough. EXAM: DG ABDOMEN ACUTE W/ 1V CHEST COMPARISON:  07/23/2015. FINDINGS: Mediastinum and hilar structures normal. Lungs are clear of acute infiltrates. Cardiomegaly with normal pulmonary vascularity. No pleural effusion or pneumothorax. Degenerative changes thoracic spine. IMPRESSION: 1.  No acute pulmonary disease. 2. Cardiomegaly. 3. No bowel distention. Prominent amount of stool noted throughout the colon. Electronically Signed   By: Marcello Moores  Register   On: 05/25/2016 10:50    Assessment & Plan:   [name] was seen today for hypertension, diabetes and hyperlipidemia.  Diagnoses and all orders for this visit:  Essential hypertension, benign- Her blood pressure is not adequately well controlled.  Her labs are negative for any  secondary causes or endorgan damage.  Will increase the dose of nebivolol and will upgrade her ARB and thiazide diuretics to more potent agents. -     Discontinue: hydrochlorothiazide (MICROZIDE) 12.5 MG capsule; Take 1 capsule (12.5 mg total) daily by mouth. -     Discontinue: losartan (COZAAR) 100 MG tablet; Take 1 tablet (100 mg total) daily by mouth. -     chlorthalidone (HYGROTON) 25 MG tablet; Take 1 tablet (25 mg total) daily by mouth. -     telmisartan (MICARDIS) 80 MG tablet; Take 1 tablet (80 mg total) daily by mouth. -     nebivolol (BYSTOLIC) 10 MG tablet; Take 1 tablet (10 mg total) daily by mouth. -     Comprehensive metabolic panel; Future -     CBC with Differential/Platelet; Future -     Urinalysis, Routine w reflex microscopic; Future  Type 2 diabetes mellitus with complication, without long-term current use of insulin ([place])- Her A1c is at 6.9%.  Her blood sugars are adequately well controlled. -  Discontinue: losartan (COZAAR) 100 MG tablet; Take 1 tablet (100 mg total) daily by mouth. -     metFORMIN (GLUCOPHAGE) 500 MG tablet; Take 1 tablet (500 mg total) 2 (two) times daily with a meal by mouth. -     glucose blood (TRUE METRIX BLOOD GLUCOSE TEST) test strip; USE TO TEST UP TO TWICE DAILY -     TRUEPLUS LANCETS 30G MISC; USE TWICE DAILY -     Alcohol Swabs PADS; Use with Diabetic testing supplies -     telmisartan (MICARDIS) 80 MG tablet; Take 1 tablet (80 mg total) daily by mouth. -     Comprehensive metabolic panel; Future -     Microalbumin / creatinine urine ratio; Future  Hyperlipidemia with target LDL less than 100- she has achieved her LDL goal and is doing well on the statin. -     pravastatin (PRAVACHOL) 10 MG tablet; Take 1 tablet (10 mg total) daily by mouth. -     Lipid panel; Future -     Thyroid Panel With TSH; Future -     Hemoglobin A1c; Future  Meralgia paresthetica, bilateral lower limbs- I have asked her to start physical therapy for symptom  relief. -     Ambulatory referral to Physical Therapy   I have discontinued Domenique M. Stodghill's Indacaterol-Glycopyrrolate, hydrochlorothiazide, nebivolol, losartan, hydrochlorothiazide, and losartan. I have changed her TRUE METRIX BLOOD GLUCOSE TEST to glucose blood. I have also changed her metFORMIN and pravastatin. Additionally, I am having her start on Alcohol Swabs, chlorthalidone, telmisartan, and nebivolol. Lastly, I am having her maintain her aspirin EC, ferrous sulfate, buPROPion, naproxen, and TRUEPLUS LANCETS 30G.  Meds ordered this encounter  Medications  . DISCONTD: hydrochlorothiazide (MICROZIDE) 12.5 MG capsule    Sig: Take 1 capsule (12.5 mg total) daily by mouth.    Dispense:  90 capsule    Refill:  1  . DISCONTD: losartan (COZAAR) 100 MG tablet    Sig: Take 1 tablet (100 mg total) daily by mouth.    Dispense:  90 tablet    Refill:  1  . metFORMIN (GLUCOPHAGE) 500 MG tablet    Sig: Take 1 tablet (500 mg total) 2 (two) times daily with a meal by mouth.    Dispense:  180 tablet    Refill:  1  . pravastatin (PRAVACHOL) 10 MG tablet    Sig: Take 1 tablet (10 mg total) daily by mouth.    Dispense:  90 tablet    Refill:  1  . glucose blood (TRUE METRIX BLOOD GLUCOSE TEST) test strip    Sig: USE TO TEST UP TO TWICE DAILY    Dispense:  100 each    Refill:  11  . TRUEPLUS LANCETS 30G MISC    Sig: USE TWICE DAILY    Dispense:  100 each    Refill:  11  . Alcohol Swabs PADS    Sig: Use with Diabetic testing supplies    Dispense:  100 each    Refill:  11  . chlorthalidone (HYGROTON) 25 MG tablet    Sig: Take 1 tablet (25 mg total) daily by mouth.    Dispense:  90 tablet    Refill:  0  . telmisartan (MICARDIS) 80 MG tablet    Sig: Take 1 tablet (80 mg total) daily by mouth.    Dispense:  90 tablet    Refill:  0  . nebivolol (BYSTOLIC) 10 MG tablet    Sig: Take 1 tablet (10  mg total) daily by mouth.    Dispense:  90 tablet    Refill:  0     Follow-up: Return in about  6 weeks (around 05/30/2017).  Scarlette Calico, MD

## 2017-04-19 ENCOUNTER — Telehealth: Payer: Self-pay | Admitting: Internal Medicine

## 2017-04-19 ENCOUNTER — Encounter: Payer: Self-pay | Admitting: Internal Medicine

## 2017-04-19 ENCOUNTER — Other Ambulatory Visit: Payer: Self-pay | Admitting: Internal Medicine

## 2017-04-19 DIAGNOSIS — G5713 Meralgia paresthetica, bilateral lower limbs: Secondary | ICD-10-CM | POA: Insufficient documentation

## 2017-04-19 LAB — COMPREHENSIVE METABOLIC PANEL
ALT: 11 U/L (ref 0–35)
AST: 11 U/L (ref 0–37)
Albumin: 3.7 g/dL (ref 3.5–5.2)
Alkaline Phosphatase: 118 U/L — ABNORMAL HIGH (ref 39–117)
BILIRUBIN TOTAL: 0.4 mg/dL (ref 0.2–1.2)
BUN: 12 mg/dL (ref 6–23)
CALCIUM: 9.3 mg/dL (ref 8.4–10.5)
CHLORIDE: 103 meq/L (ref 96–112)
CO2: 30 mEq/L (ref 19–32)
CREATININE: 0.71 mg/dL (ref 0.40–1.20)
GFR: 110.42 mL/min (ref >60.00)
GLUCOSE: 103 mg/dL — AB (ref 70–99)
Potassium: 4.3 mEq/L (ref 3.5–5.1)
Sodium: 137 mEq/L (ref 135–145)
Total Protein: 7.6 g/dL (ref 6.0–8.3)

## 2017-04-19 LAB — THYROID PANEL WITH TSH
FREE THYROXINE INDEX: 2.2 (ref 1.4–3.8)
T3 Uptake: 28 % (ref 22–35)
T4 TOTAL: 7.7 ug/dL (ref 5.1–11.9)
TSH: 4.07 mIU/L

## 2017-04-19 LAB — URINALYSIS, ROUTINE W REFLEX MICROSCOPIC
BILIRUBIN URINE: NEGATIVE
Hgb urine dipstick: NEGATIVE
Ketones, ur: NEGATIVE
Leukocytes, UA: NEGATIVE
Nitrite: NEGATIVE
PH: 6 (ref 5.0–8.0)
Specific Gravity, Urine: 1.02 (ref 1.000–1.030)
TOTAL PROTEIN, URINE-UPE24: NEGATIVE
UROBILINOGEN UA: 0.2 (ref 0.0–1.0)
Urine Glucose: NEGATIVE

## 2017-04-19 LAB — LIPID PANEL
Cholesterol: 176 mg/dL (ref 0–200)
HDL: 43 mg/dL (ref >39.00)
LDL CALC: 114 mg/dL — AB (ref 0–99)
NONHDL: 132.74
TRIGLYCERIDES: 92 mg/dL (ref 0.0–149.0)
Total CHOL/HDL Ratio: 4
VLDL: 18.4 mg/dL (ref 0.0–40.0)

## 2017-04-19 LAB — MICROALBUMIN / CREATININE URINE RATIO
CREATININE, U: 91.6 mg/dL
MICROALB UR: 1 mg/dL (ref 0.0–1.9)
Microalb Creat Ratio: 1.1 mg/g (ref 0.0–30.0)

## 2017-04-19 NOTE — Telephone Encounter (Signed)
Pharmacy called and stated they spoke to [facility] yesterday and were told that a lot of meds were just sent over but to stop them because [facility] was going to send in all new meds,  They never received these meds so they would like to know what to do  Please call back  Aaron Edelman  [phone]

## 2017-04-20 MED FILL — PRAVASTATIN NA 10 MG TAB: 10 | 90 days supply | Qty: 90 | Fill #0

## 2017-04-20 MED FILL — ACCU-CHEK GUIDE TEST STRIP: 90 days supply | Qty: 200 | Fill #0

## 2017-04-20 MED FILL — ACCU-CHEK FASTCLIX LANCETS: 90 days supply | Qty: 204 | Fill #0

## 2017-04-20 MED FILL — SM ALCOHOL 70% PREP PADS: 70 | 90 days supply | Qty: 200 | Fill #0

## 2017-04-20 NOTE — Telephone Encounter (Signed)
Called pharmacy and corrected the medications that she has on file. They are filling all of the prescriptions now.

## 2017-04-21 ENCOUNTER — Other Ambulatory Visit: Payer: Self-pay | Admitting: Internal Medicine

## 2017-04-21 DIAGNOSIS — M170 Bilateral primary osteoarthritis of knee: Secondary | ICD-10-CM

## 2017-04-22 MED FILL — NAPROXEN 500 MG TABLET: 500 | 30 days supply | Qty: 60 | Fill #0

## 2017-04-22 MED FILL — metFORMIN HCL 500 MG TABS: 500 | 90 days supply | Qty: 180 | Fill #0

## 2017-04-22 NOTE — Telephone Encounter (Signed)
Pt called in and said that her ins is not covering her bp meds and her fluid pill, she would like something else called in that [facility] will cover ?     Pharmacy  On file

## 2017-04-25 ENCOUNTER — Other Ambulatory Visit: Payer: Self-pay | Admitting: Internal Medicine

## 2017-05-09 MED FILL — CHLORTHALIDONE 25 MG TAB: 25 | 90 days supply | Qty: 90 | Fill #0

## 2017-05-09 MED FILL — BYSTOLIC 10 MG TABLET: 10 | 90 days supply | Qty: 90 | Fill #0

## 2017-05-09 MED FILL — TELMISARTAN 80 MG TABS: 80 | 90 days supply | Qty: 90 | Fill #0

## 2017-05-10 ENCOUNTER — Telehealth: Payer: Self-pay | Admitting: Physical Therapy

## 2017-05-10 NOTE — Telephone Encounter (Signed)
05/02/17 & 05/10/17 left message to call to schedule PT eval. No return call

## 2017-05-17 ENCOUNTER — Other Ambulatory Visit: Payer: Self-pay

## 2017-05-17 NOTE — Patient Outreach (Signed)
[facility]) Care Management  05/17/2017  Lorraine Holt 1966-03-29 [id number]  Case closed in [place].  Member is being followed by the [employer] program Member enrolled in an external program. Peter Garter RN, [facility] Care Management Coordinator-Link to [place] Management [phone]

## 2017-05-19 ENCOUNTER — Telehealth: Payer: Self-pay | Admitting: Internal Medicine

## 2017-05-19 ENCOUNTER — Encounter: Payer: Self-pay | Admitting: Physical Therapy

## 2017-05-19 ENCOUNTER — Ambulatory Visit: Payer: 59 | Attending: Internal Medicine | Admitting: Physical Therapy

## 2017-05-19 DIAGNOSIS — R262 Difficulty in walking, not elsewhere classified: Secondary | ICD-10-CM | POA: Insufficient documentation

## 2017-05-19 DIAGNOSIS — M79662 Pain in left lower leg: Secondary | ICD-10-CM | POA: Diagnosis not present

## 2017-05-19 DIAGNOSIS — M79661 Pain in right lower leg: Secondary | ICD-10-CM | POA: Diagnosis not present

## 2017-05-19 DIAGNOSIS — M6281 Muscle weakness (generalized): Secondary | ICD-10-CM | POA: Diagnosis not present

## 2017-05-19 NOTE — Telephone Encounter (Signed)
Copied from [place]. Topic: General - Other >> May 19, 2017  1:39 PM Yvette Rack wrote: Reason for CRM: patient calling stating that her FMLA forms got faxed to the company but it was all blank papers could you please refax FMLA papers to [phone] ATTN: Claim number [id number] and pt would also like for someone to call [employer] to see if they had received paper work also

## 2017-05-19 NOTE — Therapy (Signed)
[place] [address] [place], [place], [address] Phone: [phone]   Fax:  [phone]  Physical Therapy Evaluation  Patient Details  Name: Lorraine Holt MRN: [id number] Date of Birth: [birth year] Referring Provider: Scarlette Calico   Encounter Date: 05/19/2017  PT End of Session - 05/19/17 1025    Visit Number  1    Date for PT Re-Evaluation  07/20/17    PT Start Time  0925    PT Stop Time  1010    PT Time Calculation (min)  45 min    Activity Tolerance  Patient tolerated treatment well    Behavior During Therapy  [facility] for tasks assessed/performed       Past Medical History:  Diagnosis Date  . Anemia    history age 51'S  . Bronchitis   . Carpal tunnel syndrome of right wrist   . Diabetes mellitus   . Hyperlipidemia   . Hypertension   . Obesity   . Peripheral vascular disease (HCC)    undiagnosis, but complains of numbness in legs  . Shortness of breath dyspnea    with exertion  . Tooth abscess 09/30/14   tooth was extracted    Past Surgical History:  Procedure Laterality Date  . ABDOMINAL HYSTERECTOMY  09/2015  . BILATERAL SALPINGECTOMY Right 09/17/2014   Procedure: RIGHT SALPINGECTOMY;  Surgeon: Lavonia Drafts, MD;  Location: [facility];  Service: Gynecology;  Laterality: Right;  . BREAST BIOPSY Left   . COLONOSCOPY  04/2014  . DILATION AND CURETTAGE OF UTERUS    . ROBOTIC ASSISTED TOTAL HYSTERECTOMY N/A 09/17/2014   Procedure: ROBOTIC ASSISTED TOTAL HYSTERECTOMY;  Surgeon: Lavonia Drafts, MD;  Location: [facility];  Service: Gynecology;  Laterality: N/A;  . TOOTH EXTRACTION  09/30/14  . TUBAL LIGATION      There were no vitals filed for this visit.   Subjective Assessment - 05/19/17 0925    Subjective  Patient reports that she has had leg pain, thigh numbness and tingling for a number of years.  MD felt like it was a compression of the femoral nerve.  She reports that the lower leg pain is  newer..  Reports that she has had an MRI and NCV testing.    Limitations  Standing;Walking    How long can you stand comfortably?  reports that she has to sit down to cook a meal, reports leans on sink to do dishes    How long can you walk comfortably?  120 feet and has to sit    Patient Stated Goals  have less pain and do more, lose weight    Currently in Pain?  Yes    Pain Score  3     Pain Location  Leg    Pain Orientation  Right;Left;Upper;Lower    Pain Descriptors / Indicators  Aching;Tingling;Sharp;Sore    Pain Type  Chronic pain    Pain Onset  More than a month ago    Pain Frequency  Constant    Aggravating Factors   walking, standing pain can be up to a 10/10    Pain Relieving Factors  rest, in recliner the pain will decrease to 3/10    Effect of Pain on Daily Activities  limits walking and standing and ADL's         [facility] PT Assessment - 05/19/17 0001      Assessment   Medical Diagnosis  meralgic parasthesia, leg pain    Referring Provider  Scarlette Calico    Onset Date/Surgical Date  04/19/17    Prior Therapy  none      Precautions   Precautions  None      Balance Screen   Has the patient fallen in the past 6 months  No    Has the patient had a decrease in activity level because of a fear of falling?   No    Is the patient reluctant to leave their home because of a fear of falling?   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    Leisure  not exercising      ROM / Strength   AROM / PROM / Strength  AROM;Strength      AROM   Overall AROM Comments  lumbar ROM was decreased 50% with some pain in the LE's, LE ROM was WFL's      Strength   Overall Strength Comments  4-/5 for the hips and knees with pain with the pressure where hands were for MMT       Flexibility   Soft Tissue Assessment /Muscle Length  yes    Hamstrings  very tight with pain in the back of the legs and numbness inthe feet    Quadriceps  very tight    ITB  very tight with pain     Piriformis  tight      Palpation   Palpation comment  she is tender in the thighs, anterior and laterlal, very tender in the shin area      Ambulation/Gait   Gait Comments  no device, slow, reports has to rest at about 120 feet of walking,              Objective measurements completed on examination: See above findings.      [facility] PT Treatment/Exercise - 05/19/17 0001      Exercises   Exercises  Knee/Hip      Knee/Hip Exercises: Aerobic   Nustep  level 4 x 5 minutes with 1 rest break    Other Aerobic  UBE level 4 x 4 minutes             PT Education - 05/19/17 1023    Education provided  Yes    Education Details  Went over diagnosis of meralgic parasthesia    Person(s) Educated  Patient    Methods  Explanation    Comprehension  Verbalized understanding       PT Short Term Goals - 05/19/17 1030      PT SHORT TERM GOAL #1   Title  independent with initial HEP    Time  2    Period  Weeks    Status  New        PT Long Term Goals - 05/19/17 1031      PT LONG TERM GOAL #1   Title  decrease pain in the legs 25%    Time  8    Period  Weeks    Status  New      PT LONG TERM GOAL #2   Title  independent and safe with gym workout    Time  8    Period  Weeks    Status  New      PT LONG TERM GOAL #3   Title  tolerate standing 10 minutes to cook a meal    Time  8    Period  Weeks    Status  New      PT LONG TERM GOAL #4   Title  walk 1000 feet without rest    Time  8    Period  Weeks    Status  New             Plan - 05/19/17 1026    Clinical Impression Statement  Patient with a diagnosis of meralgic parasthesia.  She reports that over the past few years she has had some increased pain and tingling in the thighs and the shin area.   She reports that she has difficulty standing > 2 minutes and difficulty walking >120 feet due to pain and fatigue in the legs.      Clinical Presentation  Stable    Clinical Decision Making  Moderate    Rehab Potential  Good    PT Frequency  2x / week    PT Duration  8 weeks    PT Treatment/Interventions  ADLs/Self Care Home Management;Electrical Stimulation;Moist Heat;Gait training;Balance training;Therapeutic exercise;Therapeutic activities;Functional mobility training;Patient/family education;Manual techniques    PT Next Visit Plan  start gym exercises, she has a copay so I feel if we get her in our gym maybe we educate her on the machines and how to use and by January see if she can be independent    Consulted and Agree with Plan of Care  Patient       Patient will benefit from skilled therapeutic intervention in order to improve the following deficits and impairments:  Abnormal gait, Decreased range of motion, Difficulty walking, Obesity, Increased muscle spasms, Decreased activity tolerance, Pain, Decreased balance, Impaired flexibility, Decreased mobility, Decreased strength  Visit Diagnosis: Difficulty in walking, not elsewhere classified - Plan: PT plan of care cert/re-cert  Muscle weakness (generalized) - Plan: PT plan of care cert/re-cert  Pain in both lower legs - Plan: PT plan of care cert/re-cert     Problem List Patient Active Problem List   Diagnosis Date Noted  . Meralgia paresthetica, bilateral lower limbs 04/19/2017  . Palpitations 09/28/2016  . Chronic idiopathic constipation 05/25/2016  . OSA (obstructive sleep apnea) 05/25/2016  . Primary osteoarthritis of both knees 05/25/2016  . Tobacco abuse 07/23/2015  . Obstructive chronic bronchitis without exacerbation ([place]) 07/23/2015  . Iron deficiency anemia 05/22/2015  . Abnormal mammogram of both breasts 10/01/2014  . Severe obesity (BMI >= 40) ([place]) 07/23/2014  . Carpal tunnel syndrome of right wrist 06/26/2014   . Routine general medical examination at a health care facility 01/07/2014  . Hyperlipidemia with target LDL less than 100 08/08/2013  . Cervical cancer screening 04/30/2013  . Nonspecific abnormal electrocardiogram (ECG) (EKG) 07/12/2012  . Type II diabetes mellitus with manifestations ([place]) 02/21/2012  . Essential hypertension, benign 02/21/2012    Sumner Boast., PT 05/19/2017, 10:37 AM  [place] [address], [place], [address] Phone: [phone]   Fax:  [phone]  Name: ZAKYAH YANES MRN: [id number] Date of Birth: 10/18/1965

## 2017-05-20 NOTE — Telephone Encounter (Signed)
I did not received the FMLA forms until 12/10. Our office was closed until Wednesday due to weather. On 12/13 I called the the patient to information I had the paperwork and to see what kind of time off she was requesting. At this point I have not sent any paperwork anywhere, no forms have been approved by Dr.Jones.   Jonelle Sidle had informed me the patient came by the office before we had opened and was requesting FMLA to be completed. Patient was informed at that time, we could not move forward until we received FMLA forms and a appointment maybe needed to approve this. That I, [place] would follow up once they had been received. Patient had yet to contact [employer] about taking leave. She was informed she would have to contact them and make this request.  When I had spoke with patient, she informed me that last week when she came by the office before hours and had spoke with the nurse, which was Jonelle Sidle. That she is not been back to work. I asked why she had not been back to work, she stated because the doctor took her out of work for PT. I asked if he had given her a note, she stated the nurse told her it was approved. I informed that is not how it works, you have to have a note or forms completed by a doctor. She stated I did not know what I was talking about, who do I think I am, she needs to speak with her doctor. I informed her of who I was and that I am the one who handled these forms and no forms have been approved.   Dr.Jones has been informed of this and patient spoke with [facility] and was informed she had to have a appointment to discuss FMLA. She has a appointment on 12/20.

## 2017-05-24 ENCOUNTER — Encounter: Payer: Self-pay | Admitting: Physical Therapy

## 2017-05-24 ENCOUNTER — Ambulatory Visit: Payer: 59 | Admitting: Physical Therapy

## 2017-05-24 DIAGNOSIS — R262 Difficulty in walking, not elsewhere classified: Secondary | ICD-10-CM | POA: Diagnosis not present

## 2017-05-24 DIAGNOSIS — M6281 Muscle weakness (generalized): Secondary | ICD-10-CM | POA: Diagnosis not present

## 2017-05-24 DIAGNOSIS — M79662 Pain in left lower leg: Secondary | ICD-10-CM | POA: Diagnosis not present

## 2017-05-24 DIAGNOSIS — M79661 Pain in right lower leg: Secondary | ICD-10-CM | POA: Diagnosis not present

## 2017-05-24 NOTE — Therapy (Signed)
[place] [address], [place], [address] Phone: [phone]   Fax:  [phone]  Physical Therapy Treatment  Patient Details  Name: Lorraine Holt MRN: [id number] Date of Birth: [birth year] Referring Provider: Scarlette Calico   Encounter Date: 05/24/2017  PT End of Session - 05/24/17 0857    Visit Number  2    Date for PT Re-Evaluation  07/20/17    PT Start Time  0835    PT Stop Time  0920    PT Time Calculation (min)  45 min       Past Medical History:  Diagnosis Date  . Anemia    history age 40'S  . Bronchitis   . Carpal tunnel syndrome of right wrist   . Diabetes mellitus   . Hyperlipidemia   . Hypertension   . Obesity   . Peripheral vascular disease (HCC)    undiagnosis, but complains of numbness in legs  . Shortness of breath dyspnea    with exertion  . Tooth abscess 09/30/14   tooth was extracted    Past Surgical History:  Procedure Laterality Date  . ABDOMINAL HYSTERECTOMY  09/2015  . BILATERAL SALPINGECTOMY Right 09/17/2014   Procedure: RIGHT SALPINGECTOMY;  Surgeon: Lavonia Drafts, MD;  Location: [facility];  Service: Gynecology;  Laterality: Right;  . BREAST BIOPSY Left   . COLONOSCOPY  04/2014  . DILATION AND CURETTAGE OF UTERUS    . ROBOTIC ASSISTED TOTAL HYSTERECTOMY N/A 09/17/2014   Procedure: ROBOTIC ASSISTED TOTAL HYSTERECTOMY;  Surgeon: Lavonia Drafts, MD;  Location: [facility];  Service: Gynecology;  Laterality: N/A;  . TOOTH EXTRACTION  09/30/14  . TUBAL LIGATION      There were no vitals filed for this visit.  Subjective Assessment - 05/24/17 0837    Subjective  doing okay, no change from eval    Currently in Pain?  Yes    Pain Score  2     Pain Location  Leg    Pain Orientation  Right;Left                      [facility] Adult PT Treatment/Exercise - 05/24/17 0001      Exercises   Exercises  Lumbar;Knee/Hip      Lumbar Exercises: Machines for  Strengthening   Cybex Lumbar Extension  black tband 2 sets 10    Other Lumbar Machine Exercise  lat pull and seated row 15# 2 sets 10      Lumbar Exercises: Supine   Ab Set  15 reps;3 seconds    Clam  15 reps;3 seconds red tband, hip flex    Straight Leg Raise  10 reps    Other Supine Lumbar Exercises  bridge 10 times    Other Supine Lumbar Exercises  add ball squeeze 3 sec 15 times      Knee/Hip Exercises: Aerobic   Nustep  level 4 x 6 minutes       Knee/Hip Exercises: Machines for Strengthening   Cybex Knee Extension  5# 2 sets 10    Cybex Knee Flexion  15# 2 sets 10      Knee/Hip Exercises: Standing   Other Standing Knee Exercises  red tband hip 3 way 15 each      Knee/Hip Exercises: Sidelying   Hip ABduction  Strengthening;Both;10 reps;1 set    Clams  10 each             PT Education -  05/24/17 0848    Education Details  supine hip strengthening and red tband standing hip strengthening    Person(s) Educated  Patient    Methods  Explanation;Handout    Comprehension  Verbalized understanding;Returned demonstration       PT Short Term Goals - 05/24/17 0857      PT SHORT TERM GOAL #1   Title  independent with initial HEP    Status  Achieved        PT Long Term Goals - 05/19/17 1031      PT LONG TERM GOAL #1   Title  decrease pain in the legs 25%    Time  8    Period  Weeks    Status  New      PT LONG TERM GOAL #2   Title  independent and safe with gym workout    Time  8    Period  Weeks    Status  New      PT LONG TERM GOAL #3   Title  tolerate standing 10 minutes to cook a meal    Time  8    Period  Weeks    Status  New      PT LONG TERM GOAL #4   Title  walk 1000 feet without rest    Time  8    Period  Weeks    Status  New            Plan - 05/24/17 0857    Clinical Impression Statement  STG met. Tolerated ther ex well, some cuing needed and fatigued noted.    PT Treatment/Interventions  ADLs/Self Care Home Management;Electrical  Stimulation;Moist Heat;Gait training;Balance training;Therapeutic exercise;Therapeutic activities;Functional mobility training;Patient/family education;Manual techniques    PT Next Visit Plan  assess and progress       Patient will benefit from skilled therapeutic intervention in order to improve the following deficits and impairments:  Abnormal gait, Decreased range of motion, Difficulty walking, Obesity, Increased muscle spasms, Decreased activity tolerance, Pain, Decreased balance, Impaired flexibility, Decreased mobility, Decreased strength  Visit Diagnosis: Difficulty in walking, not elsewhere classified  Muscle weakness (generalized)  Pain in both lower legs     Problem List Patient Active Problem List   Diagnosis Date Noted  . Meralgia paresthetica, bilateral lower limbs 04/19/2017  . Palpitations 09/28/2016  . Chronic idiopathic constipation 05/25/2016  . OSA (obstructive sleep apnea) 05/25/2016  . Primary osteoarthritis of both knees 05/25/2016  . Tobacco abuse 07/23/2015  . Obstructive chronic bronchitis without exacerbation ([place]) 07/23/2015  . Iron deficiency anemia 05/22/2015  . Abnormal mammogram of both breasts 10/01/2014  . Severe obesity (BMI >= 40) ([place]) 07/23/2014  . Carpal tunnel syndrome of right wrist 06/26/2014  . Routine general medical examination at a health care facility 01/07/2014  . Hyperlipidemia with target LDL less than 100 08/08/2013  . Cervical cancer screening 04/30/2013  . Nonspecific abnormal electrocardiogram (ECG) (EKG) 07/12/2012  . Type II diabetes mellitus with manifestations ([place]) 02/21/2012  . Essential hypertension, benign 02/21/2012    Kyliee Ortego,ANGIE PTA 05/24/2017, 9:06 AM  [address] [place], [place], [address] Phone: [phone]   Fax:  [phone]  Name: NAKYIAH KUCK MRN: [id number] Date of Birth: November 06, 1965

## 2017-05-26 ENCOUNTER — Encounter: Payer: Self-pay | Admitting: Internal Medicine

## 2017-05-26 ENCOUNTER — Ambulatory Visit (INDEPENDENT_AMBULATORY_CARE_PROVIDER_SITE_OTHER): Payer: 59 | Admitting: Internal Medicine

## 2017-05-26 VITALS — BP 120/90 | HR 80 | Temp 98.3°F | Resp 16 | Ht 70.5 in | Wt 327.8 lb

## 2017-05-26 DIAGNOSIS — I10 Essential (primary) hypertension: Secondary | ICD-10-CM

## 2017-05-26 DIAGNOSIS — G5713 Meralgia paresthetica, bilateral lower limbs: Secondary | ICD-10-CM | POA: Diagnosis not present

## 2017-05-26 DIAGNOSIS — E118 Type 2 diabetes mellitus with unspecified complications: Secondary | ICD-10-CM

## 2017-05-26 MED ORDER — LOSARTAN POTASSIUM 100 MG PO TABS: 100.0000 mg | ORAL_TABLET | Freq: Every day | ORAL | 3 refills | Status: DC

## 2017-05-26 MED FILL — LOSARTAN POTASSIUM 100 MG T: 100 | 90 days supply | Qty: 90 | Fill #0

## 2017-05-26 NOTE — Patient Instructions (Signed)

## 2017-05-26 NOTE — Telephone Encounter (Signed)
FMLA forms have been completed, Placed in MD's box to review &sign.

## 2017-05-26 NOTE — Progress Notes (Signed)
Subjective:  Patient ID: Lorraine Holt, female    DOB: February 09, 1966  Age: 51 y.o. MRN: [id number]  CC: Hypertension and Diabetes   HPI NIOMI VALENT presents for f/up - she continues to complain of pain and burning in both thighs, more on the left side than the right side.  She has started working with a physical therapist and has had an improvement in her range of motion.  She is also starting to exercise.  She does however, complain that she is in too much discomfort to return to work at this time.  She is taking Naprosyn which provides some symptom relief.  She denies numbness, weakness, tingling in her lower extremities.  Outpatient Medications Prior to Visit  Medication Sig Dispense Refill  . Alcohol Swabs PADS Use with Diabetic testing supplies 100 each 11  . aspirin EC 81 MG tablet Take 1 tablet (81 mg total) by mouth daily. 90 tablet 3  . buPROPion (WELLBUTRIN SR) 150 MG 12 hr tablet Take 1 tablet (150 mg total) by mouth 2 (two) times daily. 60 tablet 3  . chlorthalidone (HYGROTON) 25 MG tablet Take 1 tablet (25 mg total) daily by mouth. 90 tablet 0  . ferrous sulfate 325 (65 FE) MG tablet Take 1 tablet (325 mg total) by mouth 2 (two) times daily with a meal. (Patient taking differently: Take 325 mg by mouth daily with breakfast. ) 180 tablet 3  . glucose blood (TRUE METRIX BLOOD GLUCOSE TEST) test strip USE TO TEST UP TO TWICE DAILY 100 each 11  . metFORMIN (GLUCOPHAGE) 500 MG tablet Take 1 tablet (500 mg total) 2 (two) times daily with a meal by mouth. 180 tablet 1  . naproxen (NAPROSYN) 500 MG tablet TAKE 1 TABLET BY MOUTH TWICE DAILY WITH MEALS (MUST KEEP APPT IN NOV FOR FUTURE REFILLS) 60 tablet 2  . nebivolol (BYSTOLIC) 10 MG tablet Take 1 tablet (10 mg total) daily by mouth. 90 tablet 0  . pravastatin (PRAVACHOL) 10 MG tablet Take 1 tablet (10 mg total) daily by mouth. 90 tablet 1  . TRUEPLUS LANCETS 30G MISC USE TWICE DAILY 100 each 11  . UTIBRON NEOHALER 27.5-15.6 MCG CAPS   11    . telmisartan (MICARDIS) 80 MG tablet Take 1 tablet (80 mg total) daily by mouth. 90 tablet 0   No facility-administered medications prior to visit.     ROS Review of Systems  Constitutional: Negative.  Negative for appetite change, diaphoresis, fatigue and unexpected weight change.  HENT: Negative.  Negative for trouble swallowing.   Eyes: Negative for visual disturbance.  Respiratory: Negative for cough, chest tightness, shortness of breath and wheezing.   Cardiovascular: Negative.  Negative for chest pain, palpitations and leg swelling.  Gastrointestinal: Negative for abdominal pain, constipation, diarrhea, nausea and vomiting.  Endocrine: Negative.   Genitourinary: Negative.  Negative for difficulty urinating.  Musculoskeletal: Positive for myalgias. Negative for arthralgias and back pain.  Skin: Negative.   Neurological: Negative.  Negative for dizziness, weakness, light-headedness, numbness and headaches.  Hematological: Negative for adenopathy. Does not bruise/bleed easily.  Psychiatric/Behavioral: Negative.     Objective:  BP 120/90 (BP Location: Left Arm, Patient Position: Sitting, Cuff Size: Normal)   Pulse 80   Temp 98.3 F (36.8 C) (Oral)   Resp 16   Ht 5' 10.5" (1.791 m)   Wt (!) 327 lb 12 oz (148.7 kg)   LMP 08/08/2014   SpO2 99%   BMI 46.36 kg/m   BP Readings  from Last 3 Encounters:  05/26/17 120/90  04/18/17 (!) 158/102  09/28/16 (!) 150/100    Wt Readings from Last 3 Encounters:  05/26/17 (!) 327 lb 12 oz (148.7 kg)  04/18/17 (!) 325 lb (147.4 kg)  09/28/16 (!) 342 lb (155.1 kg)    Physical Exam  Constitutional: She is oriented to person, place, and time. No distress.  HENT:  Mouth/Throat: Oropharynx is clear and moist. No oropharyngeal exudate.  Eyes: Conjunctivae are normal. Left eye exhibits no discharge. No scleral icterus.  Neck: Normal range of motion. Neck supple. No JVD present. No thyromegaly present.  Cardiovascular: Normal rate,  regular rhythm and normal heart sounds.  No murmur heard. Pulmonary/Chest: Effort normal and breath sounds normal. No respiratory distress. She has no rales. She exhibits no tenderness.  Abdominal: Soft. Bowel sounds are normal. She exhibits no distension and no mass. There is no tenderness.  Musculoskeletal: Normal range of motion. She exhibits no edema, tenderness or deformity.  Neurological: She is alert and oriented to person, place, and time. She has normal reflexes. She displays normal reflexes. She exhibits normal muscle tone. Coordination normal.  Skin: Skin is warm and dry. No rash noted. She is not diaphoretic. No erythema. No pallor.  Vitals reviewed.   Lab Results  Component Value Date   WBC 7.6 04/18/2017   HGB 14.8 04/18/2017   HCT 45.3 04/18/2017   PLT 256.0 04/18/2017   GLUCOSE 103 (H) 04/18/2017   CHOL 176 04/18/2017   TRIG 92.0 04/18/2017   HDL 43.00 04/18/2017   LDLCALC 114 (H) 04/18/2017   ALT 11 04/18/2017   AST 11 04/18/2017   NA 137 04/18/2017   K 4.3 04/18/2017   CL 103 04/18/2017   CREATININE 0.71 04/18/2017   BUN 12 04/18/2017   CO2 30 04/18/2017   TSH 4.07 04/18/2017   HGBA1C 6.9 (H) 04/18/2017   MICROALBUR 1.0 04/18/2017    Dg Abd Acute W/chest  Result Date: 05/25/2016 CLINICAL DATA:  Cough. EXAM: DG ABDOMEN ACUTE W/ 1V CHEST COMPARISON:  07/23/2015. FINDINGS: Mediastinum and hilar structures normal. Lungs are clear of acute infiltrates. Cardiomegaly with normal pulmonary vascularity. No pleural effusion or pneumothorax. Degenerative changes thoracic spine. IMPRESSION: 1.  No acute pulmonary disease. 2. Cardiomegaly. 3. No bowel distention. Prominent amount of stool noted throughout the colon. Electronically Signed   By: Marcello Moores  Register   On: 05/25/2016 10:50    Assessment & Plan:   Kanda was seen today for hypertension and diabetes.  Diagnoses and all orders for this visit:  Type 2 diabetes mellitus with complication, without long-term  current use of insulin (HCC) -     losartan (COZAAR) 100 MG tablet; Take 1 tablet (100 mg total) by mouth daily.  Essential hypertension, benign- Her blood pressure is adequately well controlled. -     losartan (COZAAR) 100 MG tablet; Take 1 tablet (100 mg total) by mouth daily.  Meralgia paresthetica, bilateral lower limbs- She is starting to improve with physical therapy but is not ready to return to work at this time.  I have written a work note and completed her FMLA paperwork.  She agrees to continue with the physical therapy and to stay active.   I have discontinued Naje Rice. Shaker's telmisartan. I am also having her start on losartan. Additionally, I am having her maintain her aspirin EC, ferrous sulfate, buPROPion, metFORMIN, pravastatin, glucose blood, TRUEPLUS LANCETS 30G, Alcohol Swabs, chlorthalidone, nebivolol, naproxen, and UTIBRON NEOHALER.  Meds ordered this encounter  Medications  .  losartan (COZAAR) 100 MG tablet    Sig: Take 1 tablet (100 mg total) by mouth daily.    Dispense:  90 tablet    Refill:  3     Follow-up: Return in about 4 months (around 09/24/2017).  Scarlette Calico, MD

## 2017-05-27 ENCOUNTER — Ambulatory Visit: Payer: 59 | Admitting: Physical Therapy

## 2017-05-27 ENCOUNTER — Telehealth: Payer: Self-pay | Admitting: Internal Medicine

## 2017-05-27 DIAGNOSIS — M79661 Pain in right lower leg: Secondary | ICD-10-CM | POA: Diagnosis not present

## 2017-05-27 DIAGNOSIS — R262 Difficulty in walking, not elsewhere classified: Secondary | ICD-10-CM | POA: Diagnosis not present

## 2017-05-27 DIAGNOSIS — M79662 Pain in left lower leg: Secondary | ICD-10-CM | POA: Diagnosis not present

## 2017-05-27 DIAGNOSIS — M6281 Muscle weakness (generalized): Secondary | ICD-10-CM

## 2017-05-27 NOTE — Telephone Encounter (Signed)
Copied from [place] [id number]. Topic: Inquiry >> May 27, 2017 11:57 AM Malena Catholic I, NT wrote: Reason for CRM: pt call and said her paper need to be Fax to [facility] said it will be fax yesterday please call pt went it fax it Thanks

## 2017-05-27 NOTE — Therapy (Signed)
[place] [address] [place], [place], [address] Phone: [phone]   Fax:  [phone]  Physical Therapy Treatment  Patient Details  Name: Lorraine Holt MRN: [id number] Date of Birth: [birth year] Referring Provider: Scarlette Calico   Encounter Date: 05/27/2017  PT End of Session - 05/27/17 0855    Visit Number  3    Date for PT Re-Evaluation  07/20/17    PT Start Time  0830    PT Stop Time  0915    PT Time Calculation (min)  45 min       Past Medical History:  Diagnosis Date  . Anemia    history age 29'S  . Bronchitis   . Carpal tunnel syndrome of right wrist   . Diabetes mellitus   . Hyperlipidemia   . Hypertension   . Obesity   . Peripheral vascular disease (HCC)    undiagnosis, but complains of numbness in legs  . Shortness of breath dyspnea    with exertion  . Tooth abscess 09/30/14   tooth was extracted    Past Surgical History:  Procedure Laterality Date  . ABDOMINAL HYSTERECTOMY  09/2015  . BILATERAL SALPINGECTOMY Right 09/17/2014   Procedure: RIGHT SALPINGECTOMY;  Surgeon: Lavonia Drafts, MD;  Location: [facility];  Service: Gynecology;  Laterality: Right;  . BREAST BIOPSY Left   . COLONOSCOPY  04/2014  . DILATION AND CURETTAGE OF UTERUS    . ROBOTIC ASSISTED TOTAL HYSTERECTOMY N/A 09/17/2014   Procedure: ROBOTIC ASSISTED TOTAL HYSTERECTOMY;  Surgeon: Lavonia Drafts, MD;  Location: [facility];  Service: Gynecology;  Laterality: N/A;  . TOOTH EXTRACTION  09/30/14  . TUBAL LIGATION      There were no vitals filed for this visit.  Subjective Assessment - 05/27/17 0832    Subjective  felt okay after last session. i think the rain yesterday has me hurting    Currently in Pain?  Yes    Pain Score  4     Pain Location  Leg                      [facility] Adult PT Treatment/Exercise - 05/27/17 0001      Lumbar Exercises: Machines for Strengthening   Cybex Lumbar Extension   black tband 2 sets 10 trunk flex 2 sets 10    Other Lumbar Machine Exercise  lat pull and seated row 20# 2 sets 10      Lumbar Exercises: Standing   Other Standing Lumbar Exercises  scap stab 3 way 15 reps each red tband      Knee/Hip Exercises: Aerobic   Nustep  level 5 x 73minutes     Other Aerobic  UBE level 4 x 4 minutes      Knee/Hip Exercises: Machines for Strengthening   Cybex Knee Extension  5# 2 sets 10    Cybex Knee Flexion  15# 2 sets 10      Knee/Hip Exercises: Standing   Other Standing Knee Exercises  red tband hip 3 way 15 each      Knee/Hip Exercises: Seated   Ball Squeeze  20    Clamshell with TheraBand  Green    Knee/Hip Flexion  hip flex green tband 15 each    Sit to Sand  10 reps;without UE support with wt ball               PT Short Term Goals - 05/24/17 9390  PT SHORT TERM GOAL #1   Title  independent with initial HEP    Status  Achieved        PT Long Term Goals - 05/19/17 1031      PT LONG TERM GOAL #1   Title  decrease pain in the legs 25%    Time  8    Period  Weeks    Status  New      PT LONG TERM GOAL #2   Title  independent and safe with gym workout    Time  8    Period  Weeks    Status  New      PT LONG TERM GOAL #3   Title  tolerate standing 10 minutes to cook a meal    Time  8    Period  Weeks    Status  New      PT LONG TERM GOAL #4   Title  walk 1000 feet without rest    Time  8    Period  Weeks    Status  New            Plan - 05/27/17 5277    Clinical Impression Statement  fatigued and weakness with ther ex but no increased pain, cuing needed for posture and ex control    PT Treatment/Interventions  ADLs/Self Care Home Management;Electrical Stimulation;Moist Heat;Gait training;Balance training;Therapeutic exercise;Therapeutic activities;Functional mobility training;Patient/family education;Manual techniques    PT Next Visit Plan  increase HEP with tband       Patient will benefit from skilled  therapeutic intervention in order to improve the following deficits and impairments:  Abnormal gait, Decreased range of motion, Difficulty walking, Obesity, Increased muscle spasms, Decreased activity tolerance, Pain, Decreased balance, Impaired flexibility, Decreased mobility, Decreased strength  Visit Diagnosis: Difficulty in walking, not elsewhere classified  Muscle weakness (generalized)  Pain in both lower legs     Problem List Patient Active Problem List   Diagnosis Date Noted  . Meralgia paresthetica, bilateral lower limbs 04/19/2017  . Palpitations 09/28/2016  . Chronic idiopathic constipation 05/25/2016  . OSA (obstructive sleep apnea) 05/25/2016  . Primary osteoarthritis of both knees 05/25/2016  . Tobacco abuse 07/23/2015  . Obstructive chronic bronchitis without exacerbation ([place]) 07/23/2015  . Iron deficiency anemia 05/22/2015  . Abnormal mammogram of both breasts 10/01/2014  . Severe obesity (BMI >= 40) ([place]) 07/23/2014  . Carpal tunnel syndrome of right wrist 06/26/2014  . Routine general medical examination at a health care facility 01/07/2014  . Hyperlipidemia with target LDL less than 100 08/08/2013  . Cervical cancer screening 04/30/2013  . Nonspecific abnormal electrocardiogram (ECG) (EKG) 07/12/2012  . Type II diabetes mellitus with manifestations ([place]) 02/21/2012  . Essential hypertension, benign 02/21/2012    Yarisbel Miranda,ANGIE PTA 05/27/2017, 8:58 AM  [address] [place], [place], [address] Phone: [phone]   Fax:  [phone]  Name: Lorraine Holt MRN: [id number] Date of Birth: Sep 23, 1965

## 2017-05-27 NOTE — Telephone Encounter (Signed)
Copied from [place] #[id number]. Topic: General - Other >> May 27, 2017  3:40 PM Marin Olp L wrote: Reason for CRM: Patient checking to see if her FMLA forms have been faxed back to [employer] yet?

## 2017-05-27 NOTE — Telephone Encounter (Signed)
It can not be faxed until Dr.Jones signs it.

## 2017-05-30 NOTE — Telephone Encounter (Signed)
Please see other phone for 12/21.

## 2017-05-30 NOTE — Telephone Encounter (Signed)
Forms have been completed &signed, faxed to [employer] [phone] & Candis Schatz [phone], copy sent to scan & mailed to patient &charged for. 12/24 bo

## 2017-06-01 ENCOUNTER — Encounter: Payer: Self-pay | Admitting: Internal Medicine

## 2017-06-02 ENCOUNTER — Encounter: Payer: Self-pay | Admitting: Physical Therapy

## 2017-06-02 ENCOUNTER — Ambulatory Visit: Payer: 59 | Admitting: Physical Therapy

## 2017-06-02 DIAGNOSIS — R262 Difficulty in walking, not elsewhere classified: Secondary | ICD-10-CM

## 2017-06-02 DIAGNOSIS — M6281 Muscle weakness (generalized): Secondary | ICD-10-CM | POA: Diagnosis not present

## 2017-06-02 DIAGNOSIS — M79662 Pain in left lower leg: Secondary | ICD-10-CM | POA: Diagnosis not present

## 2017-06-02 DIAGNOSIS — M79661 Pain in right lower leg: Secondary | ICD-10-CM

## 2017-06-02 NOTE — Therapy (Signed)
[place], [place], [address] Phone: [phone]   Fax:  [phone]  Physical Therapy Treatment  Patient Details  Name: Lorraine Holt MRN: [id number] Date of Birth: 1965-12-18 Referring Provider: Scarlette Calico   Encounter Date: 06/02/2017  PT End of Session - 06/02/17 0923    Visit Number  4    Date for PT Re-Evaluation  07/20/17    PT Start Time  0835    PT Stop Time  0935    PT Time Calculation (min)  60 min    Activity Tolerance  Patient tolerated treatment well    Behavior During Therapy  [facility] for tasks assessed/performed       Past Medical History:  Diagnosis Date  . Anemia    history age 45'S  . Bronchitis   . Carpal tunnel syndrome of right wrist   . Diabetes mellitus   . Hyperlipidemia   . Hypertension   . Obesity   . Peripheral vascular disease (HCC)    undiagnosis, but complains of numbness in legs  . Shortness of breath dyspnea    with exertion  . Tooth abscess 09/30/14   tooth was extracted    Past Surgical History:  Procedure Laterality Date  . ABDOMINAL HYSTERECTOMY  09/2015  . BILATERAL SALPINGECTOMY Right 09/17/2014   Procedure: RIGHT SALPINGECTOMY;  Surgeon: Lavonia Drafts, MD;  Location: [facility];  Service: Gynecology;  Laterality: Right;  . BREAST BIOPSY Left   . COLONOSCOPY  04/2014  . DILATION AND CURETTAGE OF UTERUS    . ROBOTIC ASSISTED TOTAL HYSTERECTOMY N/A 09/17/2014   Procedure: ROBOTIC ASSISTED TOTAL HYSTERECTOMY;  Surgeon: Lavonia Drafts, MD;  Location: [facility];  Service: Gynecology;  Laterality: N/A;  . TOOTH EXTRACTION  09/30/14  . TUBAL LIGATION      There were no vitals filed for this visit.  Subjective Assessment - 06/02/17 0846    Subjective  I am pretty sore in the back of my legs, did more walking than I usually do.    Currently in Pain?  Yes    Pain Score  6     Pain Location  Leg    Pain Orientation  Right;Left    Aggravating Factors   walking                      [facility] Adult PT Treatment/Exercise - 06/02/17 0001      Lumbar Exercises: Machines for Strengthening   Other Lumbar Machine Exercise  lat pull and seated row 20# 2 sets 15    Other Lumbar Machine Exercise  5# hip extension and abduction 2x10      Lumbar Exercises: Supine   Other Supine Lumbar Exercises  feet on ball K2C, trunk rotation and small bridges, isometric abdominals      Knee/Hip Exercises: Aerobic   Nustep  level 5 x 68minutes     Other Aerobic  UBE level 4 x 4 minutes      Knee/Hip Exercises: Machines for Strengthening   Cybex Knee Extension  5# 2 sets 15    Cybex Knee Flexion  15# 2 sets 15      Modalities   Modalities  Electrical Stimulation;Moist Heat      Moist Heat Therapy   Number Minutes Moist Heat  15 Minutes    Moist Heat Location  Lumbar Spine      Electrical Stimulation   Electrical Stimulation Location  anterior hips  Electrical Stimulation Action  pre mod    Electrical Stimulation Parameters  supine    Electrical Stimulation Goals  Pain               PT Short Term Goals - 05/24/17 0857      PT SHORT TERM GOAL #1   Title  independent with initial HEP    Status  Achieved        PT Long Term Goals - 06/02/17 0925      PT LONG TERM GOAL #1   Title  decrease pain in the legs 25%    Status  On-going      PT LONG TERM GOAL #2   Title  independent and safe with gym workout    Status  On-going      PT LONG TERM GOAL #4   Title  walk 1000 feet without rest    Status  On-going            Plan - 06/02/17 0924    Clinical Impression Statement  Patient doing well with the exercises, gets tired and has some increased symptoms as the exercise session goes.  Tried the estim today due to the increased pain       Patient will benefit from skilled therapeutic intervention in order to improve the following deficits and impairments:  Abnormal gait, Decreased range of  motion, Difficulty walking, Obesity, Increased muscle spasms, Decreased activity tolerance, Pain, Decreased balance, Impaired flexibility, Decreased mobility, Decreased strength  Visit Diagnosis: Difficulty in walking, not elsewhere classified  Muscle weakness (generalized)  Pain in both lower legs     Problem List Patient Active Problem List   Diagnosis Date Noted  . Meralgia paresthetica, bilateral lower limbs 04/19/2017  . Palpitations 09/28/2016  . Chronic idiopathic constipation 05/25/2016  . OSA (obstructive sleep apnea) 05/25/2016  . Primary osteoarthritis of both knees 05/25/2016  . Tobacco abuse 07/23/2015  . Obstructive chronic bronchitis without exacerbation ([place]) 07/23/2015  . Iron deficiency anemia 05/22/2015  . Abnormal mammogram of both breasts 10/01/2014  . Severe obesity (BMI >= 40) ([place]) 07/23/2014  . Carpal tunnel syndrome of right wrist 06/26/2014  . Routine general medical examination at a health care facility 01/07/2014  . Hyperlipidemia with target LDL less than 100 08/08/2013  . Cervical cancer screening 04/30/2013  . Nonspecific abnormal electrocardiogram (ECG) (EKG) 07/12/2012  . Type II diabetes mellitus with manifestations ([place]) 02/21/2012  . Essential hypertension, benign 02/21/2012    Sumner Boast., PT 06/02/2017, 9:25 AM  [address] [place], [place], [address] Phone: [phone]   Fax:  [phone]  Name: BRIXTON SCHNAPP MRN: [id number] Date of Birth: 08-30-1965

## 2017-06-03 ENCOUNTER — Ambulatory Visit: Payer: 59 | Admitting: Physical Therapy

## 2017-06-03 DIAGNOSIS — R262 Difficulty in walking, not elsewhere classified: Secondary | ICD-10-CM

## 2017-06-03 DIAGNOSIS — M6281 Muscle weakness (generalized): Secondary | ICD-10-CM | POA: Diagnosis not present

## 2017-06-03 DIAGNOSIS — M79662 Pain in left lower leg: Secondary | ICD-10-CM

## 2017-06-03 DIAGNOSIS — M79661 Pain in right lower leg: Secondary | ICD-10-CM | POA: Diagnosis not present

## 2017-06-03 NOTE — Therapy (Signed)
[address] [place], [place], [address] Phone: [phone]   Fax:  [phone]  Physical Therapy Treatment  Patient Details  Name: Lorraine Holt MRN: [id number] Date of Birth: 29-Mar-1966 Referring Provider: Scarlette Calico   Encounter Date: 06/03/2017  PT End of Session - 06/03/17 0834    Visit Number  5    Date for PT Re-Evaluation  07/20/17    PT Start Time  0800    PT Stop Time  2671    PT Time Calculation (min)  55 min       Past Medical History:  Diagnosis Date  . Anemia    history age 85'S  . Bronchitis   . Carpal tunnel syndrome of right wrist   . Diabetes mellitus   . Hyperlipidemia   . Hypertension   . Obesity   . Peripheral vascular disease (HCC)    undiagnosis, but complains of numbness in legs  . Shortness of breath dyspnea    with exertion  . Tooth abscess 09/30/14   tooth was extracted    Past Surgical History:  Procedure Laterality Date  . ABDOMINAL HYSTERECTOMY  09/2015  . BILATERAL SALPINGECTOMY Right 09/17/2014   Procedure: RIGHT SALPINGECTOMY;  Surgeon: Lavonia Drafts, MD;  Location: [facility];  Service: Gynecology;  Laterality: Right;  . BREAST BIOPSY Left   . COLONOSCOPY  04/2014  . DILATION AND CURETTAGE OF UTERUS    . ROBOTIC ASSISTED TOTAL HYSTERECTOMY N/A 09/17/2014   Procedure: ROBOTIC ASSISTED TOTAL HYSTERECTOMY;  Surgeon: Lavonia Drafts, MD;  Location: [facility];  Service: Gynecology;  Laterality: N/A;  . TOOTH EXTRACTION  09/30/14  . TUBAL LIGATION      There were no vitals filed for this visit.  Subjective Assessment - 06/03/17 0801    Subjective  ye stx really helped,back of legs sore     Currently in Pain?  Yes    Pain Score  5     Pain Location  Leg    Pain Orientation  Posterior                      [facility] Adult PT Treatment/Exercise - 06/03/17 0001      Lumbar Exercises: Machines for Strengthening   Other Lumbar Machine  Exercise  lat pull and seated row 20# 2 sets 15      Lumbar Exercises: Standing   Other Standing Lumbar Exercises  ball on wall CC and CCW 5 each mod 4# dead lift 2 sets 15    Other Standing Lumbar Exercises  wt ball OH and obl 15 each      Knee/Hip Exercises: Aerobic   Nustep  level 5 x 26minutes     Other Aerobic  UBE level 4 x 4 minutes      Knee/Hip Exercises: Machines for Strengthening   Cybex Knee Extension  10# 2 sets 10    Cybex Knee Flexion  20# 2 sets 10      Knee/Hip Exercises: Standing   Lateral Step Up  Both;10 reps;Hand Hold: 2;Step Height: 6" opp leg abd    Forward Step Up  Both;10 reps;Hand Hold: 2;Step Height: 6" opp leg ext      Knee/Hip Exercises: Seated   Sit to Sand  15 reps;without UE support wt ball      Modalities   Modalities  Electrical Stimulation;Moist Heat      Moist Heat Therapy   Number Minutes Moist Heat  15 Minutes    Moist Heat Location  Lumbar Spine      Electrical Stimulation   Electrical Stimulation Location  anterior hips    Electrical Stimulation Action  premod    Electrical Stimulation Parameters  supine    Electrical Stimulation Goals  Pain               PT Short Term Goals - 05/24/17 0857      PT SHORT TERM GOAL #1   Title  independent with initial HEP    Status  Achieved        PT Long Term Goals - 06/02/17 0925      PT LONG TERM GOAL #1   Title  decrease pain in the legs 25%    Status  On-going      PT LONG TERM GOAL #2   Title  independent and safe with gym workout    Status  On-going      PT LONG TERM GOAL #4   Title  walk 1000 feet without rest    Status  On-going            Plan - 06/03/17 0834    Clinical Impression Statement  pt tolerating ther ex well ,some soreness and cuing d/t compensation    PT Treatment/Interventions  ADLs/Self Care Home Management;Electrical Stimulation;Moist Heat;Gait training;Balance training;Therapeutic exercise;Therapeutic activities;Functional mobility  training;Patient/family education;Manual techniques    PT Next Visit Plan  pt considering 2 week HOLD and try independant program an dsee how she does as she will have deductibel to me at start of year       Patient will benefit from skilled therapeutic intervention in order to improve the following deficits and impairments:  Abnormal gait, Decreased range of motion, Difficulty walking, Obesity, Increased muscle spasms, Decreased activity tolerance, Pain, Decreased balance, Impaired flexibility, Decreased mobility, Decreased strength  Visit Diagnosis: Difficulty in walking, not elsewhere classified  Muscle weakness (generalized)  Pain in both lower legs     Problem List Patient Active Problem List   Diagnosis Date Noted  . Meralgia paresthetica, bilateral lower limbs 04/19/2017  . Palpitations 09/28/2016  . Chronic idiopathic constipation 05/25/2016  . OSA (obstructive sleep apnea) 05/25/2016  . Primary osteoarthritis of both knees 05/25/2016  . Tobacco abuse 07/23/2015  . Obstructive chronic bronchitis without exacerbation ([place]) 07/23/2015  . Iron deficiency anemia 05/22/2015  . Abnormal mammogram of both breasts 10/01/2014  . Severe obesity (BMI >= 40) ([place]) 07/23/2014  . Carpal tunnel syndrome of right wrist 06/26/2014  . Routine general medical examination at a health care facility 01/07/2014  . Hyperlipidemia with target LDL less than 100 08/08/2013  . Cervical cancer screening 04/30/2013  . Nonspecific abnormal electrocardiogram (ECG) (EKG) 07/12/2012  . Type II diabetes mellitus with manifestations ([place]) 02/21/2012  . Essential hypertension, benign 02/21/2012    Nathalee Smarr,ANGIE PTA 06/03/2017, 8:40 AM  [address] [place], [place], [address] Phone: [phone]   Fax:  [phone]  Name: Lorraine Holt MRN: [id number] Date of Birth: March 31, 1966

## 2017-06-08 ENCOUNTER — Ambulatory Visit: Payer: 59 | Admitting: Internal Medicine

## 2017-06-08 ENCOUNTER — Ambulatory Visit: Payer: 59 | Admitting: Physical Therapy

## 2017-06-13 ENCOUNTER — Ambulatory Visit: Payer: 59 | Admitting: Physical Therapy

## 2017-06-14 ENCOUNTER — Telehealth: Payer: Self-pay | Admitting: Internal Medicine

## 2017-06-14 NOTE — Telephone Encounter (Signed)
I have received disability forms from [employer] requesting return to work date or if extend. Forms can be completed once patient completed her FU appointment on 06/16/17 with Dr. Ronnald Ramp.

## 2017-06-16 ENCOUNTER — Ambulatory Visit: Payer: 59 | Admitting: Physical Therapy

## 2017-06-16 ENCOUNTER — Encounter: Payer: Self-pay | Admitting: Internal Medicine

## 2017-06-16 ENCOUNTER — Ambulatory Visit: Payer: 59 | Admitting: Internal Medicine

## 2017-06-16 VITALS — BP 128/88 | HR 76 | Temp 98.4°F | Resp 16 | Ht 70.5 in | Wt 333.8 lb

## 2017-06-16 DIAGNOSIS — I10 Essential (primary) hypertension: Secondary | ICD-10-CM

## 2017-06-16 DIAGNOSIS — G5713 Meralgia paresthetica, bilateral lower limbs: Secondary | ICD-10-CM | POA: Diagnosis not present

## 2017-06-16 DIAGNOSIS — M170 Bilateral primary osteoarthritis of knee: Secondary | ICD-10-CM | POA: Diagnosis not present

## 2017-06-16 NOTE — Patient Instructions (Signed)

## 2017-06-21 ENCOUNTER — Encounter: Payer: Self-pay | Admitting: Internal Medicine

## 2017-06-21 NOTE — Progress Notes (Signed)
Subjective:  Patient ID: Lorraine Holt, female    DOB: 01-21-1966  Age: 52 y.o. MRN: [id number]  CC: Muscle Pain   HPI Lorraine Holt presents for f/up - She continues to have diffuse lower extremity musculoskeletal pain.  She does not feel like she is ready to go back to work at this time.  She is compliant with physical therapy appointments but says the PT exacerbates the pain.  In fact, any movement or activity worsens her pain.  Her ability to walk is improved.  She continues to take naproxen for symptom relief.  Outpatient Medications Prior to Visit  Medication Sig Dispense Refill  . Alcohol Swabs PADS Use with Diabetic testing supplies 100 each 11  . aspirin EC 81 MG tablet Take 1 tablet (81 mg total) by mouth daily. 90 tablet 3  . buPROPion (WELLBUTRIN SR) 150 MG 12 hr tablet Take 1 tablet (150 mg total) by mouth 2 (two) times daily. 60 tablet 3  . chlorthalidone (HYGROTON) 25 MG tablet Take 1 tablet (25 mg total) daily by mouth. 90 tablet 0  . ferrous sulfate 325 (65 FE) MG tablet Take 1 tablet (325 mg total) by mouth 2 (two) times daily with a meal. (Patient taking differently: Take 325 mg by mouth daily with breakfast. ) 180 tablet 3  . glucose blood (TRUE METRIX BLOOD GLUCOSE TEST) test strip USE TO TEST UP TO TWICE DAILY 100 each 11  . losartan (COZAAR) 100 MG tablet Take 1 tablet (100 mg total) by mouth daily. 90 tablet 3  . metFORMIN (GLUCOPHAGE) 500 MG tablet Take 1 tablet (500 mg total) 2 (two) times daily with a meal by mouth. 180 tablet 1  . naproxen (NAPROSYN) 500 MG tablet TAKE 1 TABLET BY MOUTH TWICE DAILY WITH MEALS (MUST KEEP APPT IN NOV FOR FUTURE REFILLS) 60 tablet 2  . nebivolol (BYSTOLIC) 10 MG tablet Take 1 tablet (10 mg total) daily by mouth. 90 tablet 0  . pravastatin (PRAVACHOL) 10 MG tablet Take 1 tablet (10 mg total) daily by mouth. 90 tablet 1  . TRUEPLUS LANCETS 30G MISC USE TWICE DAILY 100 each 11  . UTIBRON NEOHALER 27.5-15.6 MCG CAPS   11   No  facility-administered medications prior to visit.     ROS Review of Systems  Constitutional: Negative.   HENT: Negative.   Eyes: Negative for visual disturbance.  Respiratory: Negative for cough, chest tightness, shortness of breath and wheezing.   Cardiovascular: Negative for leg swelling.  Gastrointestinal: Negative for abdominal pain, constipation, diarrhea, nausea and vomiting.  Endocrine: Negative.   Genitourinary: Negative.  Negative for difficulty urinating.  Musculoskeletal: Positive for arthralgias, gait problem and myalgias. Negative for back pain, joint swelling, neck pain and neck stiffness.  Skin: Negative.   Allergic/Immunologic: Negative.   Neurological: Negative for dizziness, weakness, light-headedness and numbness.  Hematological: Negative for adenopathy. Does not bruise/bleed easily.  Psychiatric/Behavioral: Negative.     Objective:  BP 128/88 (BP Location: Left Arm, Patient Position: Sitting, Cuff Size: Large)   Pulse 76   Temp 98.4 F (36.9 C) (Oral)   Resp 16   Ht 5' 10.5" (1.791 m)   Wt (!) 333 lb 12 oz (151.4 kg)   LMP 08/08/2014   SpO2 92%   BMI 47.21 kg/m   BP Readings from Last 3 Encounters:  06/16/17 128/88  05/26/17 120/90  04/18/17 (!) 158/102    Wt Readings from Last 3 Encounters:  06/16/17 (!) 333 lb 12 oz (151.4  kg)  05/26/17 (!) 327 lb 12 oz (148.7 kg)  04/18/17 (!) 325 lb (147.4 kg)    Physical Exam  Constitutional: She is oriented to person, place, and time. No distress.  HENT:  Mouth/Throat: Oropharynx is clear and moist. No oropharyngeal exudate.  Eyes: Conjunctivae are normal. Left eye exhibits no discharge.  Neck: Normal range of motion. Neck supple. No JVD present. No thyromegaly present.  Cardiovascular: Normal rate, regular rhythm and normal heart sounds.  No murmur heard. Pulmonary/Chest: Effort normal and breath sounds normal. No respiratory distress. She has no wheezes. She has no rales.  Abdominal: Soft. Bowel  sounds are normal. She exhibits no mass. There is no tenderness. There is no rebound and no guarding.  Musculoskeletal: Normal range of motion. She exhibits no edema, tenderness or deformity.  Neurological: She is alert and oriented to person, place, and time.  Skin: Skin is warm and dry. No rash noted. She is not diaphoretic. No erythema. No pallor.  Vitals reviewed.   Lab Results  Component Value Date   WBC 7.6 04/18/2017   HGB 14.8 04/18/2017   HCT 45.3 04/18/2017   PLT 256.0 04/18/2017   GLUCOSE 103 (H) 04/18/2017   CHOL 176 04/18/2017   TRIG 92.0 04/18/2017   HDL 43.00 04/18/2017   LDLCALC 114 (H) 04/18/2017   ALT 11 04/18/2017   AST 11 04/18/2017   NA 137 04/18/2017   K 4.3 04/18/2017   CL 103 04/18/2017   CREATININE 0.71 04/18/2017   BUN 12 04/18/2017   CO2 30 04/18/2017   TSH 4.07 04/18/2017   HGBA1C 6.9 (H) 04/18/2017   MICROALBUR 1.0 04/18/2017    Dg Abd Acute W/chest  Result Date: 05/25/2016 CLINICAL DATA:  Cough. EXAM: DG ABDOMEN ACUTE W/ 1V CHEST COMPARISON:  07/23/2015. FINDINGS: Mediastinum and hilar structures normal. Lungs are clear of acute infiltrates. Cardiomegaly with normal pulmonary vascularity. No pleural effusion or pneumothorax. Degenerative changes thoracic spine. IMPRESSION: 1.  No acute pulmonary disease. 2. Cardiomegaly. 3. No bowel distention. Prominent amount of stool noted throughout the colon. Electronically Signed   By: Marcello Moores  Register   On: 05/25/2016 10:50    Assessment & Plan:   Lorraine Holt was seen today for muscle pain.  Diagnoses and all orders for this visit:  Meralgia paresthetica, bilateral lower limbs- She has made some improvement but is not ready to return to work.  Will ask her to stay out of work for the next 2 weeks.  She will continue physical therapy and the anti-inflammatory.  Primary osteoarthritis of both knees- as above  Essential hypertension, benign- her blood pressure is well controlled.   I am having Lorraine Holt maintain her aspirin EC, ferrous sulfate, buPROPion, metFORMIN, pravastatin, glucose blood, TRUEPLUS LANCETS 30G, Alcohol Swabs, chlorthalidone, nebivolol, naproxen, UTIBRON NEOHALER, and losartan.  No orders of the defined types were placed in this encounter.    Follow-up: Return if symptoms worsen or fail to improve.  Scarlette Calico, MD

## 2017-06-23 NOTE — Telephone Encounter (Signed)
Forms have been completed & signed. Faxed to [facility] [phone], Copy sent to scan.   Spoke with patient to inform. She would like a copy mailed to her.

## 2017-06-27 ENCOUNTER — Ambulatory Visit: Payer: 59 | Admitting: Internal Medicine

## 2017-07-21 MED FILL — NAPROXEN 500 MG TABLET: 500 | 30 days supply | Qty: 60 | Fill #1

## 2017-07-21 MED FILL — metFORMIN HCL 500 MG TABS: 500 | 90 days supply | Qty: 180 | Fill #1

## 2017-07-21 MED FILL — PRAVASTATIN SODIUM 10 MG TA: 10 | 90 days supply | Qty: 90 | Fill #1

## 2017-08-23 ENCOUNTER — Other Ambulatory Visit: Payer: Self-pay | Admitting: Internal Medicine

## 2017-08-23 DIAGNOSIS — I10 Essential (primary) hypertension: Secondary | ICD-10-CM

## 2017-08-23 MED FILL — CHLORTHALIDONE 25 MG TAB: 25 | 90 days supply | Qty: 90 | Fill #0

## 2017-08-23 MED FILL — LOSARTAN POTASSIUM 100 MG T: 100 | 90 days supply | Qty: 90 | Fill #1

## 2017-08-23 MED FILL — NAPROXEN 500 MG TABLET: 500 | 30 days supply | Qty: 60 | Fill #2

## 2017-11-01 ENCOUNTER — Other Ambulatory Visit: Payer: Self-pay | Admitting: Internal Medicine

## 2017-11-01 DIAGNOSIS — M170 Bilateral primary osteoarthritis of knee: Secondary | ICD-10-CM

## 2017-11-01 MED FILL — NAPROXEN 500 MG TABLET: 500 | 30 days supply | Qty: 60 | Fill #0

## 2017-11-16 LAB — HM DIABETES EYE EXAM

## 2017-11-29 ENCOUNTER — Other Ambulatory Visit: Payer: Self-pay | Admitting: Internal Medicine

## 2017-11-29 DIAGNOSIS — E118 Type 2 diabetes mellitus with unspecified complications: Secondary | ICD-10-CM

## 2017-11-29 DIAGNOSIS — I10 Essential (primary) hypertension: Secondary | ICD-10-CM

## 2017-11-29 MED FILL — ACCU-CHEK FASTCLIX LANCETS: 90 days supply | Qty: 204 | Fill #1

## 2017-11-29 MED FILL — ACCU-CHEK GUIDE TEST STRIP: 90 days supply | Qty: 200 | Fill #1

## 2017-11-29 MED FILL — NAPROXEN 500 MG TABLET: 500 | 30 days supply | Qty: 60 | Fill #1

## 2017-11-29 MED FILL — LOSARTAN POTASSIUM 100 MG T: 100 | 90 days supply | Qty: 90 | Fill #2

## 2017-11-30 ENCOUNTER — Other Ambulatory Visit: Payer: Self-pay | Admitting: Internal Medicine

## 2017-11-30 DIAGNOSIS — I10 Essential (primary) hypertension: Secondary | ICD-10-CM

## 2017-11-30 DIAGNOSIS — E118 Type 2 diabetes mellitus with unspecified complications: Secondary | ICD-10-CM

## 2017-12-19 ENCOUNTER — Other Ambulatory Visit (INDEPENDENT_AMBULATORY_CARE_PROVIDER_SITE_OTHER): Payer: 59

## 2017-12-19 ENCOUNTER — Encounter: Payer: Self-pay | Admitting: Internal Medicine

## 2017-12-19 ENCOUNTER — Ambulatory Visit (INDEPENDENT_AMBULATORY_CARE_PROVIDER_SITE_OTHER)
Admission: RE | Admit: 2017-12-19 | Discharge: 2017-12-19 | Disposition: A | Discharge status: Home / Self Care | Payer: 59 | Source: Ambulatory Visit | Attending: Internal Medicine | Admitting: Internal Medicine

## 2017-12-19 ENCOUNTER — Ambulatory Visit: Payer: 59 | Admitting: Internal Medicine

## 2017-12-19 ENCOUNTER — Other Ambulatory Visit: Payer: Self-pay | Admitting: Internal Medicine

## 2017-12-19 VITALS — BP 140/82 | HR 66 | Ht 70.5 in | Wt 349.0 lb

## 2017-12-19 DIAGNOSIS — R059 Cough, unspecified: Secondary | ICD-10-CM

## 2017-12-19 DIAGNOSIS — D508 Other iron deficiency anemias: Secondary | ICD-10-CM | POA: Diagnosis not present

## 2017-12-19 DIAGNOSIS — J988 Other specified respiratory disorders: Secondary | ICD-10-CM | POA: Diagnosis not present

## 2017-12-19 DIAGNOSIS — R600 Localized edema: Secondary | ICD-10-CM

## 2017-12-19 DIAGNOSIS — I10 Essential (primary) hypertension: Secondary | ICD-10-CM | POA: Diagnosis not present

## 2017-12-19 DIAGNOSIS — E118 Type 2 diabetes mellitus with unspecified complications: Secondary | ICD-10-CM | POA: Diagnosis not present

## 2017-12-19 DIAGNOSIS — E785 Hyperlipidemia, unspecified: Secondary | ICD-10-CM | POA: Diagnosis not present

## 2017-12-19 DIAGNOSIS — R05 Cough: Secondary | ICD-10-CM

## 2017-12-19 DIAGNOSIS — R7989 Other specified abnormal findings of blood chemistry: Secondary | ICD-10-CM | POA: Diagnosis not present

## 2017-12-19 LAB — URINALYSIS, ROUTINE W REFLEX MICROSCOPIC
BILIRUBIN URINE: NEGATIVE
HGB URINE DIPSTICK: NEGATIVE
Ketones, ur: NEGATIVE
Leukocytes, UA: NEGATIVE
NITRITE: NEGATIVE
PH: 6 (ref 5.0–8.0)
RBC / HPF: NONE SEEN (ref 0–?)
Specific Gravity, Urine: 1.02 (ref 1.000–1.030)
TOTAL PROTEIN, URINE-UPE24: NEGATIVE
UROBILINOGEN UA: 0.2 (ref 0.0–1.0)
Urine Glucose: NEGATIVE

## 2017-12-19 LAB — CBC WITH DIFFERENTIAL/PLATELET
BASOS PCT: 1.3 % (ref 0.0–3.0)
Basophils Absolute: 0.1 10*3/uL (ref 0.0–0.1)
EOS ABS: 0.2 10*3/uL (ref 0.0–0.7)
EOS PCT: 4.5 % (ref 0.0–5.0)
HEMATOCRIT: 40.7 % (ref 36.0–46.0)
Hemoglobin: 13.2 g/dL (ref 12.0–15.0)
Lymphocytes Relative: 36.9 % (ref 12.0–46.0)
Lymphs Abs: 2 10*3/uL (ref 0.7–4.0)
MCHC: 32.4 g/dL (ref 30.0–36.0)
MCV: 89.2 fl (ref 78.0–100.0)
MONO ABS: 0.4 10*3/uL (ref 0.1–1.0)
Monocytes Relative: 7.7 % (ref 3.0–12.0)
NEUTROS ABS: 2.7 10*3/uL (ref 1.4–7.7)
Neutrophils Relative %: 49.6 % (ref 43.0–77.0)
PLATELETS: 217 10*3/uL (ref 150.0–400.0)
RBC: 4.57 Mil/uL (ref 3.87–5.11)
RDW: 14.1 % (ref 11.5–15.5)
WBC: 5.5 10*3/uL (ref 4.0–10.5)

## 2017-12-19 LAB — HEMOGLOBIN A1C: Hgb A1c MFr Bld: 8 % — ABNORMAL HIGH (ref 4.6–6.5)

## 2017-12-19 LAB — COMPREHENSIVE METABOLIC PANEL
ALT: 15 U/L (ref 0–35)
AST: 14 U/L (ref 0–37)
Albumin: 3.6 g/dL (ref 3.5–5.2)
Alkaline Phosphatase: 119 U/L — ABNORMAL HIGH (ref 39–117)
BUN: 11 mg/dL (ref 6–23)
CHLORIDE: 105 meq/L (ref 96–112)
CO2: 28 meq/L (ref 19–32)
Calcium: 9 mg/dL (ref 8.4–10.5)
Creatinine, Ser: 0.79 mg/dL (ref 0.40–1.20)
GFR: 97.38 mL/min (ref >60.00)
GLUCOSE: 121 mg/dL — AB (ref 70–99)
POTASSIUM: 3.8 meq/L (ref 3.5–5.1)
Sodium: 140 mEq/L (ref 135–145)
Total Bilirubin: 0.5 mg/dL (ref 0.2–1.2)
Total Protein: 6.9 g/dL (ref 6.0–8.3)

## 2017-12-19 LAB — BRAIN NATRIURETIC PEPTIDE: Pro B Natriuretic peptide (BNP): 237 pg/mL — ABNORMAL HIGH (ref 0.0–100.0)

## 2017-12-19 LAB — HM DIABETES FOOT EXAM

## 2017-12-19 LAB — TROPONIN I: TNIDX: 0 ug/l (ref 0.00–0.06)

## 2017-12-19 LAB — TSH: TSH: 3.08 u[IU]/mL (ref 0.35–4.50)

## 2017-12-19 MED ORDER — CEFDINIR 300 MG PO CAPS: 300.0000 mg | ORAL_CAPSULE | Freq: Two times a day (BID) | ORAL | 0 refills | Status: DC

## 2017-12-19 MED FILL — CEFDINIR 300 MG CAPS: 300 | 10 days supply | Qty: 20 | Fill #0

## 2017-12-19 NOTE — Patient Instructions (Signed)
Cough, Adult  Coughing is a reflex that clears your throat and your airways. Coughing helps to heal and protect your lungs. It is normal to cough occasionally, but a cough that happens with other symptoms or lasts a long time may be a sign of a condition that needs treatment. A cough may last only 2-3 weeks (acute), or it may last longer than 8 weeks (chronic).  What are the causes?  Coughing is commonly caused by:   Breathing in substances that irritate your lungs.   A viral or bacterial respiratory infection.   Allergies.   Asthma.   Postnasal drip.   Smoking.   Acid backing up from the stomach into the esophagus (gastroesophageal reflux).   Certain medicines.   Chronic lung problems, including COPD (or rarely, lung cancer).   Other medical conditions such as heart failure.    Follow these instructions at home:  Pay attention to any changes in your symptoms. Take these actions to help with your discomfort:   Take medicines only as told by your health care provider.  ? If you were prescribed an antibiotic medicine, take it as told by your health care provider. Do not stop taking the antibiotic even if you start to feel better.  ? Talk with your health care provider before you take a cough suppressant medicine.   Drink enough fluid to keep your urine clear or pale yellow.   If the air is dry, use a cold steam vaporizer or humidifier in your bedroom or your home to help loosen secretions.   Avoid anything that causes you to cough at work or at home.   If your cough is worse at night, try sleeping in a semi-upright position.   Avoid cigarette smoke. If you smoke, quit smoking. If you need help quitting, ask your health care provider.   Avoid caffeine.   Avoid alcohol.   Rest as needed.    Contact a health care provider if:   You have new symptoms.   You cough up pus.   Your cough does not get better after 2-3 weeks, or your cough gets worse.   You cannot control your cough with suppressant  medicines and you are losing sleep.   You develop pain that is getting worse or pain that is not controlled with pain medicines.   You have a fever.   You have unexplained weight loss.   You have night sweats.  Get help right away if:   You cough up blood.   You have difficulty breathing.   Your heartbeat is very fast.  This information is not intended to replace advice given to you by your health care provider. Make sure you discuss any questions you have with your health care provider.  Document Released: 11/20/2010 Document Revised: 10/30/2015 Document Reviewed: 07/31/2014  [employer]vier Interactive Patient Education  2018 [employer]

## 2017-12-19 NOTE — Progress Notes (Signed)
Subjective:  Patient ID: Lorraine Holt, female    DOB: 1966/01/10  Age: 52 y.o. MRN: [id number]  CC: Cough and Diabetes   HPI Lorraine Holt presents for f/up - She suffers from a chronic cough which is usually productive of clear phlegm but over the last 3 days the cough has become productive of a thick tan phlegm.  She also complains of a one-week history of weight gain, shortness of breath, DOE, and lower extremity edema.  She denies chest pain, diaphoresis, lightheadedness, dizziness, or near syncope.  She tells me she is compliant with all of her meds.  Outpatient Medications Prior to Visit  Medication Sig Dispense Refill  . Alcohol Swabs PADS Use with Diabetic testing supplies 100 each 11  . aspirin EC 81 MG tablet Take 1 tablet (81 mg total) by mouth daily. 90 tablet 3  . buPROPion (WELLBUTRIN SR) 150 MG 12 hr tablet Take 1 tablet (150 mg total) by mouth 2 (two) times daily. 60 tablet 3  . ferrous sulfate 325 (65 FE) MG tablet Take 1 tablet (325 mg total) by mouth 2 (two) times daily with a meal. (Patient taking differently: Take 325 mg by mouth daily with breakfast. ) 180 tablet 3  . glucose blood (TRUE METRIX BLOOD GLUCOSE TEST) test strip USE TO TEST UP TO TWICE DAILY 100 each 11  . losartan (COZAAR) 100 MG tablet Take 1 tablet (100 mg total) by mouth daily. 90 tablet 3  . TRUEPLUS LANCETS 30G MISC USE TWICE DAILY 100 each 11  . UTIBRON NEOHALER 27.5-15.6 MCG CAPS   11  . BYSTOLIC 10 MG tablet TAKE 1 TABLET BY MOUTH ONCE DAILY 90 tablet 0  . chlorthalidone (HYGROTON) 25 MG tablet TAKE 1 TABLET BY MOUTH ONCE DAILY (REPLACES HCTZ) 90 tablet 0  . metFORMIN (GLUCOPHAGE) 500 MG tablet Take 1 tablet (500 mg total) 2 (two) times daily with a meal by mouth. 180 tablet 1  . naproxen (NAPROSYN) 500 MG tablet TAKE 1 TABLET BY MOUTH TWICE DAILY WITH MEALS (MUST KEEP APPT IN NOV FOR FUTURE REFILLS) 60 tablet 2  . pravastatin (PRAVACHOL) 10 MG tablet Take 1 tablet (10 mg total) daily by mouth. 90  tablet 1   No facility-administered medications prior to visit.     ROS Review of Systems  Constitutional: Positive for unexpected weight change (wt gain). Negative for activity change, chills, diaphoresis, fatigue and fever.  HENT: Negative for sore throat, trouble swallowing and voice change.   Eyes: Negative for visual disturbance.  Respiratory: Positive for cough and shortness of breath. Negative for chest tightness and wheezing.   Cardiovascular: Positive for leg swelling. Negative for chest pain and palpitations.  Gastrointestinal: Negative for abdominal pain, constipation, diarrhea, nausea and vomiting.  Endocrine: Negative.  Negative for polydipsia, polyphagia and polyuria.  Genitourinary: Negative.  Negative for difficulty urinating and dysuria.  Musculoskeletal: Negative.  Negative for arthralgias, back pain, myalgias and neck pain.  Skin: Negative.  Negative for color change, pallor and rash.  Neurological: Negative.  Negative for dizziness, weakness and light-headedness.  Hematological: Negative for adenopathy. Does not bruise/bleed easily.  Psychiatric/Behavioral: Negative.     Objective:  BP 140/82 (BP Location: Left Arm, Patient Position: Sitting, Cuff Size: Large)   Pulse 66   Ht 5' 10.5" (1.791 m)   Wt (!) 349 lb (158.3 kg)   LMP 08/08/2014   SpO2 95%   BMI 49.37 kg/m   BP Readings from Last 3 Encounters:  12/19/17 140/82  06/16/17 128/88  05/26/17 120/90    Wt Readings from Last 3 Encounters:  12/19/17 (!) 349 lb (158.3 kg)  06/16/17 (!) 333 lb 12 oz (151.4 kg)  05/26/17 (!) 327 lb 12 oz (148.7 kg)    Physical Exam  Constitutional: She is oriented to person, place, and time. No distress.  HENT:  Mouth/Throat: Oropharynx is clear and moist. No oropharyngeal exudate.  Eyes: Conjunctivae are normal. No scleral icterus.  Neck: Normal range of motion. Neck supple. No JVD present. No thyromegaly present.  Cardiovascular: Normal rate, regular rhythm and  normal heart sounds. Exam reveals no gallop.  No murmur heard. EKG ---  Sinus  Rhythm  WITHIN NORMAL LIMITS   Pulmonary/Chest: Effort normal and breath sounds normal. No respiratory distress. She has no decreased breath sounds. She has no wheezes. She has no rhonchi. She has no rales.  Abdominal: Soft. Normal appearance and bowel sounds are normal. She exhibits no mass. There is no hepatosplenomegaly. There is no tenderness.  Musculoskeletal: Normal range of motion. She exhibits edema (1+ pitting edema in BLE). She exhibits no tenderness or deformity.  Lymphadenopathy:    She has no cervical adenopathy.  Neurological: She is alert and oriented to person, place, and time.  Skin: Skin is warm and dry. No rash noted. She is not diaphoretic. No pallor.  Psychiatric: She has a normal mood and affect. Her behavior is normal. Judgment and thought content normal.  Vitals reviewed.   Lab Results  Component Value Date   WBC 5.5 12/19/2017   HGB 13.2 12/19/2017   HCT 40.7 12/19/2017   PLT 217.0 12/19/2017   GLUCOSE 121 (H) 12/19/2017   CHOL 176 04/18/2017   TRIG 92.0 04/18/2017   HDL 43.00 04/18/2017   LDLCALC 114 (H) 04/18/2017   ALT 15 12/19/2017   AST 14 12/19/2017   NA 140 12/19/2017   K 3.8 12/19/2017   CL 105 12/19/2017   CREATININE 0.79 12/19/2017   BUN 11 12/19/2017   CO2 28 12/19/2017   TSH 3.08 12/19/2017   HGBA1C 8.0 (H) 12/19/2017   MICROALBUR 1.0 04/18/2017    Dg Abd Acute W/chest  Result Date: 05/25/2016 CLINICAL DATA:  Cough. EXAM: DG ABDOMEN ACUTE W/ 1V CHEST COMPARISON:  07/23/2015. FINDINGS: Mediastinum and hilar structures normal. Lungs are clear of acute infiltrates. Cardiomegaly with normal pulmonary vascularity. No pleural effusion or pneumothorax. Degenerative changes thoracic spine. IMPRESSION: 1.  No acute pulmonary disease. 2. Cardiomegaly. 3. No bowel distention. Prominent amount of stool noted throughout the colon. Electronically Signed   By: Marcello Moores   Register   On: 05/25/2016 10:50    Dg Chest 2 View  Result Date: 12/20/2017 CLINICAL DATA:  Cough for 2 days EXAM: CHEST - 2 VIEW COMPARISON:  05/25/2016 FINDINGS: Mild cardiomegaly. Diffuse interstitial prominence throughout the lungs. No confluent opacity or effusion. No acute bony abnormality. IMPRESSION: Diffuse interstitial prominence, increased since prior study. This could reflect bronchitis or chronic interstitial disease. Electronically Signed   By: Rolm Baptise M.D.   On: 12/20/2017 08:00    Assessment & Plan:   Ashonte was seen today for cough and diabetes.  Diagnoses and all orders for this visit:   Type 2 diabetes mellitus with complication, without long-term current use of insulin (Lorraine Holt)- Her blood sugars are not adequately well controlled.  Her A1c is up to 8.0%.  I will increase the dose of metformin and add on an SGLT2 inhibitor. -     Hemoglobin A1c; Future -  Dapagliflozin-metFORMIN HCl ER (XIGDUO XR) 2.10-998 MG TB24; Take 2 tablets by mouth daily.  Essential hypertension, benign- Her blood pressure is adequately well controlled.  Electrolytes and renal function are normal. -     TSH; Future -     Urinalysis, Routine w reflex microscopic; Future -     Comprehensive metabolic panel; Future -     chlorthalidone (HYGROTON) 25 MG tablet; One tab po QD -     nebivolol (BYSTOLIC) 10 MG tablet; Take 1 tablet (10 mg total) by mouth daily.  Other iron deficiency anemia- Improvement noted -     CBC with Differential/Platelet; Future  Cough- She has a worsening cough that has become productive of purulent phlegm.  Will treat the infection with a cephalosporin antibiotic.  Her chest x-ray shows interstitial prominence which is consistent with what I think is going on which is new onset heart failure with fluid overload as evidenced by her mildly elevated BNP. -     DG Chest 2 View; Future  Localized edema- Her EKG is normal.  She has a mildly elevated BNP.  I have asked her to  undergo an echocardiogram to see if there is evidence of diastolic or systolic heart failure, valvular disorder, or wall motion abnormalities. -     Brain natriuretic peptide; Future -     TSH; Future -     Troponin I; Future -     EKG 12-Lead -     ECHOCARDIOGRAM COMPLETE; Future  RTI (respiratory tract infection) -     cefdinir (OMNICEF) 300 MG capsule; Take 1 capsule (300 mg total) by mouth 2 (two) times daily.  Elevated brain natriuretic peptide (BNP) level -     ECHOCARDIOGRAM COMPLETE; Future  Hyperlipidemia with target LDL less than 100 -     pravastatin (PRAVACHOL) 10 MG tablet; Take 1 tablet (10 mg total) by mouth daily.   I have discontinued Araseli Sherry. Hellberg's metFORMIN and naproxen. I have changed her BYSTOLIC to nebivolol. I have also changed her pravastatin and chlorthalidone. Additionally, I am having her start on cefdinir and Dapagliflozin-metFORMIN HCl ER. Lastly, I am having her maintain her aspirin EC, ferrous sulfate, buPROPion, glucose blood, TRUEPLUS LANCETS 30G, Alcohol Swabs, UTIBRON NEOHALER, and losartan.  Meds ordered this encounter  Medications  . cefdinir (OMNICEF) 300 MG capsule    Sig: Take 1 capsule (300 mg total) by mouth 2 (two) times daily.    Dispense:  20 capsule    Refill:  0  . Dapagliflozin-metFORMIN HCl ER (XIGDUO XR) 2.10-998 MG TB24    Sig: Take 2 tablets by mouth daily.    Dispense:  180 tablet    Refill:  1  . pravastatin (PRAVACHOL) 10 MG tablet    Sig: Take 1 tablet (10 mg total) by mouth daily.    Dispense:  90 tablet    Refill:  1  . chlorthalidone (HYGROTON) 25 MG tablet    Sig: One tab po QD    Dispense:  90 tablet    Refill:  0  . nebivolol (BYSTOLIC) 10 MG tablet    Sig: Take 1 tablet (10 mg total) by mouth daily.    Dispense:  90 tablet    Refill:  0     Follow-up: Return in about 3 weeks (around 01/09/2018).  Scarlette Calico, MD

## 2017-12-20 ENCOUNTER — Encounter: Payer: Self-pay | Admitting: Internal Medicine

## 2017-12-20 DIAGNOSIS — R7989 Other specified abnormal findings of blood chemistry: Secondary | ICD-10-CM | POA: Insufficient documentation

## 2017-12-20 MED ORDER — PRAVASTATIN SODIUM 10 MG PO TABS: 10.0000 mg | ORAL_TABLET | Freq: Every day | ORAL | 1 refills | Status: DC

## 2017-12-20 MED ORDER — NEBIVOLOL HCL 10 MG PO TABS: 10.0000 mg | ORAL_TABLET | Freq: Every day | ORAL | 0 refills | Status: DC

## 2017-12-20 MED ORDER — CHLORTHALIDONE 25 MG PO TABS: ORAL_TABLET | ORAL | 0 refills | Status: DC

## 2017-12-20 MED ORDER — DAPAGLIFLOZIN PRO-METFORMIN ER 2.5-1000 MG PO TB24: 2.0000 | ORAL_TABLET | Freq: Every day | ORAL | 1 refills | Status: DC

## 2017-12-20 MED FILL — XIGDUO XR 2.5-1000 MG TB24: 2.5-1000 | 90 days supply | Qty: 180 | Fill #0

## 2017-12-20 MED FILL — CHLORTHALIDONE 25 MG TAB: 25 | 90 days supply | Qty: 90 | Fill #0

## 2017-12-20 MED FILL — PRAVASTATIN SODIUM 10 MG TA: 10 | 90 days supply | Qty: 90 | Fill #0

## 2017-12-27 DIAGNOSIS — H5203 Hypermetropia, bilateral: Secondary | ICD-10-CM | POA: Diagnosis not present

## 2017-12-27 DIAGNOSIS — H524 Presbyopia: Secondary | ICD-10-CM | POA: Diagnosis not present

## 2017-12-27 LAB — HM DIABETES EYE EXAM

## 2017-12-28 ENCOUNTER — Other Ambulatory Visit: Payer: Self-pay

## 2017-12-28 ENCOUNTER — Other Ambulatory Visit: Payer: Self-pay | Admitting: Internal Medicine

## 2017-12-28 ENCOUNTER — Ambulatory Visit (HOSPITAL_COMMUNITY): Payer: 59 | Attending: Internal Medicine

## 2017-12-28 ENCOUNTER — Encounter: Payer: Self-pay | Admitting: Internal Medicine

## 2017-12-28 DIAGNOSIS — R600 Localized edema: Secondary | ICD-10-CM | POA: Diagnosis not present

## 2017-12-28 DIAGNOSIS — I110 Hypertensive heart disease with heart failure: Secondary | ICD-10-CM | POA: Insufficient documentation

## 2017-12-28 DIAGNOSIS — I10 Essential (primary) hypertension: Secondary | ICD-10-CM | POA: Diagnosis not present

## 2017-12-28 DIAGNOSIS — R7989 Other specified abnormal findings of blood chemistry: Secondary | ICD-10-CM | POA: Diagnosis not present

## 2017-12-28 MED ORDER — PERFLUTREN LIPID MICROSPHERE
1.0000 mL | INTRAVENOUS | Status: AC | PRN
  Administered 2017-12-28: 2 mL via INTRAVENOUS

## 2018-01-24 NOTE — Progress Notes (Deleted)
Cardiology Office Note   Date:  01/24/2018   ID:  Lealer, [place] [birth year], MRN [id number]  PCP:  Janith Lima, MD  Cardiologist: Dr.  Cathie Olden No chief complaint on file.    History of Present Illness: Lorraine Holt is a 51 y.o. female who presents for ongoing assessment and management of hypertension, chest pain, with other history of morbid obesity, chronic leg pain, and Type II Diabetes. She is a Chartered certified accountant at [employer]. On last office visit with Dr. Cathie Olden on 07/26/2012 she was doing well. He recommended that she stop smoking and increase exercise and lose weight.     Past Medical History:  Diagnosis Date  . Anemia    history age 57'S  . Bronchitis   . Carpal tunnel syndrome of right wrist   . Diabetes mellitus   . Hyperlipidemia   . Hypertension   . Obesity   . Peripheral vascular disease (HCC)    undiagnosis, but complains of numbness in legs  . Shortness of breath dyspnea    with exertion  . Tooth abscess 09/30/14   tooth was extracted    Past Surgical History:  Procedure Laterality Date  . ABDOMINAL HYSTERECTOMY  09/2015  . BILATERAL SALPINGECTOMY Right 09/17/2014   Procedure: RIGHT SALPINGECTOMY;  Surgeon: Lavonia Drafts, MD;  Location: [facility];  Service: Gynecology;  Laterality: Right;  . BREAST BIOPSY Left   . COLONOSCOPY  04/2014  . DILATION AND CURETTAGE OF UTERUS    . ROBOTIC ASSISTED TOTAL HYSTERECTOMY N/A 09/17/2014   Procedure: ROBOTIC ASSISTED TOTAL HYSTERECTOMY;  Surgeon: Lavonia Drafts, MD;  Location: [facility];  Service: Gynecology;  Laterality: N/A;  . TOOTH EXTRACTION  09/30/14  . TUBAL LIGATION       Current Outpatient Medications  Medication Sig Dispense Refill  . Alcohol Swabs PADS Use with Diabetic testing supplies 100 each 11  . aspirin EC 81 MG tablet Take 1 tablet (81 mg total) by mouth daily. 90 tablet 3  . buPROPion (WELLBUTRIN SR) 150 MG 12 hr tablet Take 1 tablet (150 mg total) by mouth 2 (two) times daily. 60 tablet  3  . cefdinir (OMNICEF) 300 MG capsule Take 1 capsule (300 mg total) by mouth 2 (two) times daily. 20 capsule 0  . chlorthalidone (HYGROTON) 25 MG tablet One tab po QD 90 tablet 0  . Dapagliflozin-metFORMIN HCl ER (XIGDUO XR) 2.10-998 MG TB24 Take 2 tablets by mouth daily. 180 tablet 1  . ferrous sulfate 325 (65 FE) MG tablet Take 1 tablet (325 mg total) by mouth 2 (two) times daily with a meal. (Patient taking differently: Take 325 mg by mouth daily with breakfast. ) 180 tablet 3  . glucose blood (TRUE METRIX BLOOD GLUCOSE TEST) test strip USE TO TEST UP TO TWICE DAILY 100 each 11  . losartan (COZAAR) 100 MG tablet Take 1 tablet (100 mg total) by mouth daily. 90 tablet 3  . nebivolol (BYSTOLIC) 10 MG tablet Take 1 tablet (10 mg total) by mouth daily. 90 tablet 0  . pravastatin (PRAVACHOL) 10 MG tablet Take 1 tablet (10 mg total) by mouth daily. 90 tablet 1  . TRUEPLUS LANCETS 30G MISC USE TWICE DAILY 100 each 11  . UTIBRON NEOHALER 27.5-15.6 MCG CAPS   11   No current facility-administered medications for this visit.     Allergies:   Clindamycin/lincomycin; Penicillins; and Amlodipine    Social History:  The patient  reports that she has been smoking cigarettes. She has  a 10.00 pack-year smoking history. She has never used smokeless tobacco. She reports that she does not drink alcohol or use drugs.   Family History:  The patient's family history includes Breast cancer in her maternal aunt, maternal aunt, and mother; Diabetes in her maternal aunt, maternal uncle, and mother; Hypertension in her mother.    ROS: All other systems are reviewed and negative. Unless otherwise mentioned in H&P    PHYSICAL EXAM: VS:  LMP 08/08/2014  , BMI There is no height or weight on file to calculate BMI. GEN: Well nourished, well developed, in no acute distress  HEENT: normal  Neck: no JVD, carotid bruits, or masses Cardiac: ***RRR; no murmurs, rubs, or gallops,no edema  Respiratory:  clear to  auscultation bilaterally, normal work of breathing GI: soft, nontender, nondistended, + BS MS: no deformity or atrophy  Skin: warm and dry, no rash Neuro:  Strength and sensation are intact Psych: euthymic mood, full affect   EKG:  EKG {ACTION; IS/IS [id number]} ordered today. The ekg ordered today demonstrates ***   Recent Labs: 12/19/2017: ALT 15; BUN 11; Creatinine, Ser 0.79; Hemoglobin 13.2; Platelets 217.0; Potassium 3.8; Pro B Natriuretic peptide (BNP) 237.0; Sodium 140; TSH 3.08    Lipid Panel    Component Value Date/Time   CHOL 176 04/18/2017 1724   TRIG 92.0 04/18/2017 1724   HDL 43.00 04/18/2017 1724   CHOLHDL 4 04/18/2017 1724   VLDL 18.4 04/18/2017 1724   LDLCALC 114 (H) 04/18/2017 1724      Wt Readings from Last 3 Encounters:  12/19/17 (!) 349 lb (158.3 kg)  06/16/17 (!) 333 lb 12 oz (151.4 kg)  05/26/17 (!) 327 lb 12 oz (148.7 kg)      Other studies Reviewed: Study Conclusions 12/28/2017 - Procedure narrative: Transthoracic echocardiography. Technically   difficulty study with reduced echo windows. Intravenous contrast   (Definity) was administered. - Left ventricle: The cavity size was normal. There was severe   concentric hypertrophy. Systolic function was normal. The   estimated ejection fraction was in the range of 60% to 65%. Wall   motion was normal; there were no regional wall motion   abnormalities. Doppler parameters are consistent with abnormal   left ventricular relaxation (grade 1 diastolic dysfunction). The   E/e&' ratio is between 8-15, suggesting indeterminate LV filling   pressure. - Left atrium: The atrium was normal in size. - Inferior vena cava: The vessel was normal in size. The   respirophasic diameter changes were in the normal range (= 50%),   consistent with normal central venous pressure.  Impressions:  - Technically difficult study. Definity contrast given. LVEF   60-65%, severe LVH, normal wall motion, grade 1 DD,  indeterminate   LV filling pressure, normal LA size, normal IVC. Review of the above records demonstrates: ***   ASSESSMENT AND PLAN:  1.  ***   Current medicines are reviewed at length with the patient today.    Labs/ tests ordered today include: *** Phill Myron. West Pugh, ANP, AACC   01/24/2018 7:42 PM    [facility] [address] [place], [place] [address] Phone: [phone]; Fax: [phone]

## 2018-01-30 ENCOUNTER — Ambulatory Visit: Payer: 59 | Admitting: Adult Health

## 2018-01-31 ENCOUNTER — Encounter: Payer: Self-pay | Admitting: *Deleted

## 2018-02-01 ENCOUNTER — Ambulatory Visit: Payer: 59 | Admitting: Cardiology

## 2018-02-23 DIAGNOSIS — K529 Noninfective gastroenteritis and colitis, unspecified: Secondary | ICD-10-CM | POA: Diagnosis not present

## 2018-03-06 ENCOUNTER — Ambulatory Visit: Payer: 59 | Admitting: Adult Health

## 2018-03-06 ENCOUNTER — Encounter: Payer: Self-pay | Admitting: Cardiology

## 2018-03-06 ENCOUNTER — Ambulatory Visit: Payer: 59 | Admitting: Cardiology

## 2018-03-06 VITALS — BP 110/78 | HR 72 | Ht 70.5 in | Wt 340.4 lb

## 2018-03-06 DIAGNOSIS — Z1231 Encounter for screening mammogram for malignant neoplasm of breast: Secondary | ICD-10-CM | POA: Diagnosis not present

## 2018-03-06 DIAGNOSIS — I110 Hypertensive heart disease with heart failure: Secondary | ICD-10-CM | POA: Diagnosis not present

## 2018-03-06 DIAGNOSIS — E781 Pure hyperglyceridemia: Secondary | ICD-10-CM | POA: Diagnosis not present

## 2018-03-06 DIAGNOSIS — Z803 Family history of malignant neoplasm of breast: Secondary | ICD-10-CM | POA: Diagnosis not present

## 2018-03-06 MED ORDER — POTASSIUM CHLORIDE ER 10 MEQ PO TBCR: 10.0000 meq | EXTENDED_RELEASE_TABLET | Freq: Every day | ORAL | 3 refills | Status: DC

## 2018-03-06 MED ORDER — FUROSEMIDE 20 MG PO TABS: 20.0000 mg | ORAL_TABLET | Freq: Every day | ORAL | 3 refills | Status: DC

## 2018-03-06 MED FILL — NAPROXEN 500 MG TABLET: 500 | 30 days supply | Qty: 60 | Fill #2

## 2018-03-06 MED FILL — FUROSEMIDE 20 MG TABS: 20 | 90 days supply | Qty: 90 | Fill #0

## 2018-03-06 MED FILL — POTASSIUM CHL ER M10 TABLET: 10 | 90 days supply | Qty: 90 | Fill #0

## 2018-03-06 NOTE — Patient Instructions (Signed)
Medication Instructions:  Stop Chlorthalidone. Start Furosemide 20 mg daily and Potassium Chloride 10 MEQ daily. Continue all other medications as listed.  Follow-Up: Follow up in 1 month.  If you need a refill on your cardiac medications before your next appointment, please call your pharmacy.  Thank you for choosing [facility]!!

## 2018-03-06 NOTE — Progress Notes (Signed)
Cardiology Office Note:    Date:  03/06/2018   ID:  Lorraine Holt, Lorraine Holt [birth year], MRN [id number]  PCP:  Janith Lima, MD  Cardiologist:  No primary care provider on file.  Electrophysiologist:  None   Referring MD: Janith Lima, MD     History of Present Illness:    Lorraine Holt is a 52 y.o. female here for evaluation of shortness of breath, dyspnea on exertion, prior diagnosis of hypertensive heart disease with heart failure at the request of Dr. Ronnald Ramp.  She also has morbid obesity, BMI is 48.  Edema was noted in lower extremities.  EKG previously done appeared normal.  Personally reviewed.  Mildly elevated BNP was noted.    Echocardiogram was previously performed on 12/28/2017: - Procedure narrative: Transthoracic echocardiography. Technically   difficulty study with reduced echo windows. Intravenous contrast   (Definity) was administered. - Left ventricle: The cavity size was normal. There was severe   concentric hypertrophy. Systolic function was normal. The   estimated ejection fraction was in the range of 60% to 65%. Wall   motion was normal; there were no regional wall motion   abnormalities. Doppler parameters are consistent with abnormal   left ventricular relaxation (grade 1 diastolic dysfunction). The   E/e&' ratio is between 8-15, suggesting indeterminate LV filling   pressure. - Left atrium: The atrium was normal in size. - Inferior vena cava: The vessel was normal in size. The   respirophasic diameter changes were in the normal range (= 50%),   consistent with normal central venous pressure.  Impressions:  - Technically difficult study. Definity contrast given. LVEF   60-65%, severe LVH, normal wall motion, grade 1 DD, indeterminate   LV filling pressure, normal LA size, normal IVC.   Legs still hurts, standing or sitting. More congested. Cancer center. Prickly CP at times. Walking from garage up the hill, out of breath. When lay down, hard to breath.  Reposition. Neck up pillow.   Past Medical History:  Diagnosis Date  . Anemia    history age 29'S  . Bronchitis   . Carpal tunnel syndrome of right wrist   . Diabetes mellitus   . Hyperlipidemia   . Hypertension   . Obesity   . Peripheral vascular disease (HCC)    undiagnosis, but complains of numbness in legs  . Shortness of breath dyspnea    with exertion  . Tooth abscess 09/30/14   tooth was extracted    Past Surgical History:  Procedure Laterality Date  . ABDOMINAL HYSTERECTOMY  09/2015  . BILATERAL SALPINGECTOMY Right 09/17/2014   Procedure: RIGHT SALPINGECTOMY;  Surgeon: Lavonia Drafts, MD;  Location: [facility];  Service: Gynecology;  Laterality: Right;  . BREAST BIOPSY Left   . COLONOSCOPY  04/2014  . DILATION AND CURETTAGE OF UTERUS    . ROBOTIC ASSISTED TOTAL HYSTERECTOMY N/A 09/17/2014   Procedure: ROBOTIC ASSISTED TOTAL HYSTERECTOMY;  Surgeon: Lavonia Drafts, MD;  Location: [facility];  Service: Gynecology;  Laterality: N/A;  . TOOTH EXTRACTION  09/30/14  . TUBAL LIGATION      Current Medications: Current Meds  Medication Sig  . Alcohol Swabs PADS Use with Diabetic testing supplies  . aspirin EC 81 MG tablet Take 1 tablet (81 mg total) by mouth daily.  Marland Kitchen buPROPion (WELLBUTRIN SR) 150 MG 12 hr tablet Take 1 tablet (150 mg total) by mouth 2 (two) times daily.  . cefdinir (OMNICEF) 300 MG capsule Take 1 capsule (300 mg total) by  mouth 2 (two) times daily.  . Dapagliflozin-metFORMIN HCl ER (XIGDUO XR) 2.10-998 MG TB24 Take 2 tablets by mouth daily.  . ferrous sulfate 325 (65 FE) MG tablet Take 325 mg by mouth daily with breakfast.  . glucose blood (TRUE METRIX BLOOD GLUCOSE TEST) test strip USE TO TEST UP TO TWICE DAILY  . losartan (COZAAR) 100 MG tablet Take 1 tablet (100 mg total) by mouth daily.  . nebivolol (BYSTOLIC) 10 MG tablet Take 1 tablet (10 mg total) by mouth daily.  . pravastatin (PRAVACHOL) 10 MG tablet Take 1 tablet (10 mg total) by mouth  daily.  . TRUEPLUS LANCETS 30G MISC USE TWICE DAILY  . [DISCONTINUED] chlorthalidone (HYGROTON) 25 MG tablet One tab po QD     Allergies:   Clindamycin/lincomycin; Penicillins; and Amlodipine   Social History   Socioeconomic History  . Marital status: Married    Spouse name: Not on file  . Number of children: Not on file  . Years of education: Not on file  . Highest education level: Not on file  Occupational History  . Occupation: [employer]  Social Needs  . Financial resource strain: Not on file  . Food insecurity:    Worry: Not on file    Inability: Not on file  . Transportation needs:    Medical: Not on file    Non-medical: Not on file  Tobacco Use  . Smoking status: Current Every Day Smoker    Packs/day: 0.50    Years: 20.00    Pack years: 10.00    Types: Cigarettes  . Smokeless tobacco: Never Used  Substance and Sexual Activity  . Alcohol use: No  . Drug use: No  . Sexual activity: Never    Birth control/protection: Surgical  Lifestyle  . Physical activity:    Days per week: Not on file    Minutes per session: Not on file  . Stress: Not on file  Relationships  . Social connections:    Talks on phone: Not on file    Gets together: Not on file    Attends religious service: Not on file    Active member of club or organization: Not on file    Attends meetings of clubs or organizations: Not on file    Relationship status: Not on file  Other Topics Concern  . Not on file  Social History Narrative  . Not on file     Family History: The patient's family history includes Breast cancer in her maternal aunt, maternal aunt, and mother; Diabetes in her maternal aunt, maternal uncle, and mother; Hypertension in her mother. There is no history of COPD, Early death, Heart disease, Hyperlipidemia, Kidney disease, or Stroke.  ROS:   Please see the history of present illness.    Leg pain, easy bruising, shortness of breath when laying down all other systems  reviewed and are negative.  EKGs/Labs/Other Studies Reviewed:    The following studies were reviewed today: Echocardiogram, prior office note, EKG  EKG: 12/19/2017-sinus rhythm, borderline low voltage, body habitus  Recent Labs: 12/19/2017: ALT 15; BUN 11; Creatinine, Ser 0.79; Hemoglobin 13.2; Platelets 217.0; Potassium 3.8; Pro B Natriuretic peptide (BNP) 237.0; Sodium 140; TSH 3.08  Recent Lipid Panel    Component Value Date/Time   CHOL 176 04/18/2017 1724   TRIG 92.0 04/18/2017 1724   HDL 43.00 04/18/2017 1724   CHOLHDL 4 04/18/2017 1724   VLDL 18.4 04/18/2017 1724   LDLCALC 114 (H) 04/18/2017 1724    Physical  Exam:    VS:  BP 110/78   Pulse 72   Ht 5' 10.5" (1.791 m)   Wt (!) 340 lb 6.4 oz (154.4 kg)   LMP 08/08/2014   BMI 48.15 kg/m     Wt Readings from Last 3 Encounters:  03/06/18 (!) 340 lb 6.4 oz (154.4 kg)  12/19/17 (!) 349 lb (158.3 kg)  06/16/17 (!) 333 lb 12 oz (151.4 kg)     GEN: Obese Well nourished, well developed in no acute distress HEENT: Normal NECK: No JVD; No carotid bruits LYMPHATICS: No lymphadenopathy CARDIAC: RRR, no murmurs, rubs, gallops RESPIRATORY:  Clear to auscultation without rales, wheezing or rhonchi  ABDOMEN: Soft, non-tender, non-distended MUSCULOSKELETAL:  Trace LE edema; No deformity  SKIN: Warm and dry NEUROLOGIC:  Alert and oriented x 3 PSYCHIATRIC:  Normal affect   ASSESSMENT:    1. Hypertensive heart disease with heart failure ([place])   2. Morbid obesity ([place])   3. Pure hyperglyceridemia    PLAN:    In order of problems listed above:  Hypertensive heart disease with heart failure - BNP mildly elevated to 37.  Mild shortness of breath when laying down.  We will go ahead and give her Lasix 20 mg once a day with 10 mEq of potassium.  In 1 month we will have her come back in and see Truitt Merle for update.  Consider checking basic metabolic profile at that time. -Severe LVH noted on echocardiogram.  Normal EF.  Blood  pressure currently is excellently controlled. -Continue with Bystolic. -Stop chlorthalidone since we changing to furosemide 20 mg once a day.  Morbid obesity - Discussed the importance of weight loss.  Certainly this may be central in her current symptoms.  Hyperlipidemia -Continue with pravastatin.  Excellent.  LDL 114    Medication Adjustments/Labs and Tests Ordered: Current medicines are reviewed at length with the patient today.  Concerns regarding medicines are outlined above.  No orders of the defined types were placed in this encounter.  Meds ordered this encounter  Medications  . furosemide (LASIX) 20 MG tablet    Sig: Take 1 tablet (20 mg total) by mouth daily.    Dispense:  90 tablet    Refill:  3  . potassium chloride (K-DUR) 10 MEQ tablet    Sig: Take 1 tablet (10 mEq total) by mouth daily.    Dispense:  90 tablet    Refill:  3    Patient Instructions  Medication Instructions:  Stop Chlorthalidone. Start Furosemide 20 mg daily and Potassium Chloride 10 MEQ daily. Continue all other medications as listed.  Follow-Up: Follow up in 1 month.  If you need a refill on your cardiac medications before your next appointment, please call your pharmacy.  Thank you for choosing [facility]!!        Signed, Candee Furbish, MD  03/06/2018 3:30 PM    [place]

## 2018-03-16 ENCOUNTER — Telehealth: Payer: Self-pay | Admitting: Internal Medicine

## 2018-03-16 NOTE — Telephone Encounter (Signed)
Copied from [place] [id number]. Topic: General - Inquiry >> Mar 16, 2018  3:16 PM Oliver Pila B wrote: Reason for CRM: pt called to inquire if Dr. Ronnald Ramp could have her samples of bystolic 10mg  for her to pick up today; contact pt to advise and its okay to leave a message

## 2018-03-16 NOTE — Telephone Encounter (Signed)
Pt given 4 boxes.

## 2018-04-10 ENCOUNTER — Telehealth: Payer: Self-pay | Admitting: Internal Medicine

## 2018-04-10 NOTE — Telephone Encounter (Signed)
Copied from [place] [id number]. Topic: General - Other >> Apr 10, 2018  4:18 PM Cecelia Byars, NT wrote: Reason for CRM: Patient needs documentation stating she has been in to see her PCP Dr Ronnald Ramp for insurance purposes from 04/07/17 - 02/05/18 please call  her at  [phone]  please advise , She would like to pick up the paper work tomorrow .

## 2018-04-11 NOTE — Telephone Encounter (Signed)
Letter completed. Mychart message sent informing of same.

## 2018-04-13 NOTE — Progress Notes (Signed)
Cardiology Office Note   Date:  04/16/2018   ID:  Lanika, Colgate 12-06-1965, MRN [id number]  PCP:  Janith Lima, MD  Cardiologist:  Dr. Marlou Porch    Chief Complaint  Patient presents with  . Shortness of Breath      History of Present Illness: Lorraine Holt is a 52 y.o. female who presents for follow up of dyspnea.  Pt has been seen for complaint of shortness of breath, dyspnea on exertion, prior diagnosis of hypertensive heart disease with heart failure..  She also has morbid obesity, BMI is 48.  Edema was noted in lower extremities.  EKG previously done appeared normal.  Personally reviewed.  Mildly elevated BNP was noted.    Echocardiogram was previously performed on 12/28/2017: - Procedure narrative: Transthoracic echocardiography. Technically difficulty study with reduced echo windows. Intravenous contrast (Definity) was administered. - Left ventricle: The cavity size was normal. There was severe concentric hypertrophy. Systolic function was normal. The estimated ejection fraction was in the range of 60% to 65%. Wall motion was normal; there were no regional wall motion abnormalities. Doppler parameters are consistent with abnormal left ventricular relaxation (grade 1 diastolic dysfunction). The E/e&' ratio is between 8-15, suggesting indeterminate LV filling pressure. - Left atrium: The atrium was normal in size. - Inferior vena cava: The vessel was normal in size. The respirophasic diameter changes were in the normal range (= 50%), consistent with normal central venous pressure.  Impressions:  - Technically difficult study. Definity contrast given. LVEF 60-65%, severe LVH, normal wall motion, grade 1 DD, indeterminate LV filling pressure, normal LA size, normal IVC.   On Last visit mild shortness of breath when laying down.  Dr. Marlou Porch changed HCTA to Lasix 20 mg once a day with 10 mEq of potassium -Severe LVH noted on  echocardiogram.  Normal EF.  Blood pressure currently is excellently controlled. -Continue with Bystolic.  Today she does feel better but still with DOE.  No chest pain.  We reviewed diet and she does eat fast food, chinese discussed salt and hidden in foods.  She does use homemade soup so less salt.  Discussed importance of exercise.  BP still elevated.  She will have BP [facility] at [facility]   Past Medical History:  Diagnosis Date  . Anemia    history age 41'S  . Bronchitis   . Carpal tunnel syndrome of right wrist   . Diabetes mellitus   . Hyperlipidemia   . Hypertension   . Obesity   . Peripheral vascular disease (HCC)    undiagnosis, but complains of numbness in legs  . Shortness of breath dyspnea    with exertion  . Tooth abscess 09/30/14   tooth was extracted    Past Surgical History:  Procedure Laterality Date  . ABDOMINAL HYSTERECTOMY  09/2015  . BILATERAL SALPINGECTOMY Right 09/17/2014   Procedure: RIGHT SALPINGECTOMY;  Surgeon: Lavonia Drafts, MD;  Location: [facility];  Service: Gynecology;  Laterality: Right;  . BREAST BIOPSY Left   . COLONOSCOPY  04/2014  . DILATION AND CURETTAGE OF UTERUS    . ROBOTIC ASSISTED TOTAL HYSTERECTOMY N/A 09/17/2014   Procedure: ROBOTIC ASSISTED TOTAL HYSTERECTOMY;  Surgeon: Lavonia Drafts, MD;  Location: [facility];  Service: Gynecology;  Laterality: N/A;  . TOOTH EXTRACTION  09/30/14  . TUBAL LIGATION       Current Outpatient Medications  Medication Sig Dispense Refill  . Alcohol Swabs PADS Use with Diabetic testing supplies 100 each 11  .  aspirin EC 81 MG tablet Take 1 tablet (81 mg total) by mouth daily. 90 tablet 3  . buPROPion (WELLBUTRIN SR) 150 MG 12 hr tablet Take 1 tablet (150 mg total) by mouth 2 (two) times daily. 60 tablet 3  . Dapagliflozin-metFORMIN HCl ER (XIGDUO XR) 2.10-998 MG TB24 Take 2 tablets by mouth daily. 180 tablet 1  . ferrous sulfate 325 (65 FE) MG tablet Take 325 mg by mouth daily with  breakfast.    . furosemide (LASIX) 20 MG tablet Take 2 tablets (40 mg total) by mouth daily. 180 tablet 3  . glucose blood (TRUE METRIX BLOOD GLUCOSE TEST) test strip USE TO TEST UP TO TWICE DAILY 100 each 11  . losartan (COZAAR) 100 MG tablet Take 1 tablet (100 mg total) by mouth daily. 90 tablet 3  . nebivolol (BYSTOLIC) 10 MG tablet Take 1 tablet (10 mg total) by mouth daily. 90 tablet 0  . potassium chloride (K-DUR) 10 MEQ tablet Take 1 tablet (10 mEq total) by mouth daily. 90 tablet 3  . pravastatin (PRAVACHOL) 10 MG tablet Take 1 tablet (10 mg total) by mouth daily. 90 tablet 1  . TRUEPLUS LANCETS 30G MISC USE TWICE DAILY 100 each 11   No current facility-administered medications for this visit.     Allergies:   Clindamycin/lincomycin; Penicillins; and Amlodipine    Social History:  The patient  reports that she has been smoking cigarettes. She has a 10.00 pack-year smoking history. She has never used smokeless tobacco. She reports that she does not drink alcohol or use drugs.   Family History:  The patient's family history includes Breast cancer in her maternal aunt, maternal aunt, and mother; Diabetes in her maternal aunt, maternal uncle, and mother; Hypertension in her mother.    ROS:  General:no colds or fevers, no weight changes Skin:no rashes or ulcers HEENT:no blurred vision, no congestion CV:see HPI PUL:see HPI GI:no diarrhea constipation or melena, no indigestion GU:no hematuria, no dysuria MS:no joint pain, no claudication Neuro:no syncope, no lightheadedness Endo:+ diabetes, no thyroid disease  Wt Readings from Last 3 Encounters:  04/14/18 (!) 340 lb 6.4 oz (154.4 kg)  03/06/18 (!) 340 lb 6.4 oz (154.4 kg)  12/19/17 (!) 349 lb (158.3 kg)     PHYSICAL EXAM: VS:  BP (!) 136/98   Pulse 73   Ht 5' 10.5" (1.791 m)   Wt (!) 340 lb 6.4 oz (154.4 kg)   LMP 08/08/2014   SpO2 93%   BMI 48.15 kg/m  , BMI Body mass index is 48.15 kg/m. General:Pleasant affect,  NAD Skin:Warm and dry, brisk capillary refill HEENT:normocephalic, sclera clear, mucus membranes moist Neck:supple, no JVD, no bruits  Heart:S1S2 RRR without murmur, gallup, rub or click Lungs:clear without rales, rhonchi, or wheezes UKG:URKY, non tender, + BS, do not palpate liver spleen or masses Ext:no lower ext edema, 2+ pedal pulses, 2+ radial pulses Neuro:alert and oriented, MAE, follows commands, + facial symmetry    EKG:  EKG is NOT ordered today.    Recent Labs: 12/19/2017: ALT 15; Hemoglobin 13.2; Platelets 217.0; TSH 3.08 04/14/2018: BUN 11; Creatinine, Ser 0.76; NT-Pro BNP 133; Potassium 4.2; Sodium 141    Lipid Panel    Component Value Date/Time   CHOL 176 04/18/2017 1724   TRIG 92.0 04/18/2017 1724   HDL 43.00 04/18/2017 1724   CHOLHDL 4 04/18/2017 1724   VLDL 18.4 04/18/2017 1724   LDLCALC 114 (H) 04/18/2017 1724       Other studies Reviewed:  Additional studies/ records that were reviewed today include:  Echo see above   ASSESSMENT AND PLAN:  1.  Diastolic HF - will increase lasix to 40 mg daily and recheck BMP today.  Discussed decreasing salt.   2.   HTN improved will recheck BP in 1-2 weeks with increase of lasix.   3.   HLD continue pravastatin.  Ding well.    Follow up with Dr. Marlou Porch in 3 months     Current medicines are reviewed with the patient today.  The patient Has no concerns regarding medicines.  The following changes have been made:  See above Labs/ tests ordered today include:see above  Disposition:   FU:  see above  Signed, Cecilie Kicks, NP  04/16/2018 1:38 PM    [facility] [place], [place], [address] [place], [place] Phone: [phone]; Fax: [phone]

## 2018-04-14 ENCOUNTER — Encounter: Payer: Self-pay | Admitting: Cardiology

## 2018-04-14 ENCOUNTER — Ambulatory Visit: Payer: 59 | Admitting: Cardiology

## 2018-04-14 VITALS — BP 136/98 | HR 73 | Ht 70.5 in | Wt 340.4 lb

## 2018-04-14 DIAGNOSIS — E781 Pure hyperglyceridemia: Secondary | ICD-10-CM

## 2018-04-14 DIAGNOSIS — R0602 Shortness of breath: Secondary | ICD-10-CM | POA: Diagnosis not present

## 2018-04-14 DIAGNOSIS — I10 Essential (primary) hypertension: Secondary | ICD-10-CM

## 2018-04-14 LAB — BASIC METABOLIC PANEL
BUN/Creatinine Ratio: 14 (ref 9–23)
BUN: 11 mg/dL (ref 6–24)
CALCIUM: 9.1 mg/dL (ref 8.7–10.2)
CHLORIDE: 102 mmol/L (ref 96–106)
CO2: 24 mmol/L (ref 20–29)
Creatinine, Ser: 0.76 mg/dL (ref 0.57–1.00)
GFR calc non Af Amer: 89 mL/min/{1.73_m2} (ref >59)
GFR, EST AFRICAN AMERICAN: 103 mL/min/{1.73_m2} (ref >59)
Glucose: 103 mg/dL — ABNORMAL HIGH (ref 65–99)
Potassium: 4.2 mmol/L (ref 3.5–5.2)
Sodium: 141 mmol/L (ref 134–144)

## 2018-04-14 LAB — PRO B NATRIURETIC PEPTIDE: NT-PRO BNP: 133 pg/mL (ref 0–249)

## 2018-04-14 MED ORDER — FUROSEMIDE 20 MG PO TABS: 40.0000 mg | ORAL_TABLET | Freq: Every day | ORAL | 3 refills | Status: DC

## 2018-04-14 NOTE — Patient Instructions (Addendum)
Medication Instructions:  Your physician has recommended you make the following change in your medication:  1.) change lasix to 40 mg (2 tablets) once a day  If you need a refill on your cardiac medications before your next appointment, please call your pharmacy.   Lab work: Today (bmet, bnp) If you have labs (blood work) drawn today and your tests are completely normal, you will receive your results only by: Marland Kitchen MyChart Message (if you have MyChart) OR . A paper copy in the mail If you have any lab test that is abnormal or we need to change your treatment, we will call you to review the results.  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  Any Other Special Instructions Will Be Listed Below (If Applicable).

## 2018-04-16 ENCOUNTER — Encounter: Payer: Self-pay | Admitting: Cardiology

## 2018-04-17 ENCOUNTER — Telehealth: Payer: Self-pay | Admitting: *Deleted

## 2018-04-17 MED FILL — PRAVASTATIN SODIUM 10 MG TA: 10 | 90 days supply | Qty: 90 | Fill #1

## 2018-04-17 MED FILL — LOSARTAN POTASSIUM 100 MG T: 100 | 90 days supply | Qty: 90 | Fill #3

## 2018-04-17 NOTE — Telephone Encounter (Signed)
-----   Message from Isaiah Serge, NP sent at 04/16/2018  4:00 PM EST ----- Labs stable no changes in meds.

## 2018-04-17 NOTE — Telephone Encounter (Signed)
Called pt re: lab results, left a message for her to call back  

## 2018-04-17 NOTE — Telephone Encounter (Signed)
Pt also needs f/u with Dr. Marlou Porch in 3 months.

## 2018-04-20 NOTE — Telephone Encounter (Signed)
Follow up ° °Patient is returning call for lab results. °

## 2018-04-20 NOTE — Telephone Encounter (Signed)
Reviewed most recent lab results with patient who states understanding.  Attempted to have her schedule a 3 month appointment with Dr Marlou Porch however she stated she is already scheduled and wants to keep that appointment.

## 2018-04-24 ENCOUNTER — Other Ambulatory Visit: Payer: Self-pay | Admitting: Internal Medicine

## 2018-04-24 DIAGNOSIS — E118 Type 2 diabetes mellitus with unspecified complications: Secondary | ICD-10-CM

## 2018-05-21 NOTE — Progress Notes (Signed)
Cardiology Office Note   Date:  05/22/2018   ID:  Lorraine, Holt [birth year], MRN [id number]  PCP:  Lorraine Lima, MD  Cardiologist:  Dr. Marlou Holt    Chief Complaint  Patient presents with  . Hypertension      History of Present Illness: [name] is a 52 y.o. female who presents for HTN.   Pt has been seen for complaint of shortness of breath, dyspnea on exertion, prior diagnosis of hypertensive heart disease with heart failure.. She also has morbid obesity, BMI is 48. Edema was noted in lower extremities. EKG previously done appeared normal. Personally reviewed. Mildly elevated BNP was noted.   On Last visit mild shortness of breath when laying down. Dr. Marlou Holt changed HCTA to Lasix 20 mg once a day with 10 mEq of potassium -Severe LVH noted on echocardiogram. Normal EF. Blood pressure currently is excellently controlled. -Continue with Bystolic.  04/14/18 visit she felt better but still with DOE.  No chest pain.  We reviewed diet and she does eat fast food, chinese discussed salt and hidden in foods.  She does use homemade soup so less salt.  Discussed importance of exercise.  BP still elevated.  She will have BP checked at [facility]  Lasix increased back for BP check.  BP improved.  No chest pain. No SOB but feels she is choking - discussed musinex and nasocort for congestion.    Past Medical History:  Diagnosis Date  . Anemia    history age 72'S  . Bronchitis   . Carpal tunnel syndrome of right wrist   . Diabetes mellitus   . Hyperlipidemia   . Hypertension   . Obesity   . Peripheral vascular disease (HCC)    undiagnosis, but complains of numbness in legs  . Shortness of breath dyspnea    with exertion  . Tooth abscess 09/30/14   tooth was extracted    Past Surgical History:  Procedure Laterality Date  . ABDOMINAL HYSTERECTOMY  09/2015  . BILATERAL SALPINGECTOMY Right 09/17/2014   Procedure: RIGHT SALPINGECTOMY;  Surgeon: Lorraine Drafts, MD;  Location: [facility];  Service: Gynecology;  Laterality: Right;  . BREAST BIOPSY Left   . COLONOSCOPY  04/2014  . DILATION AND CURETTAGE OF UTERUS    . ROBOTIC ASSISTED TOTAL HYSTERECTOMY N/A 09/17/2014   Procedure: ROBOTIC ASSISTED TOTAL HYSTERECTOMY;  Surgeon: Lorraine Drafts, MD;  Location: [facility];  Service: Gynecology;  Laterality: N/A;  . TOOTH EXTRACTION  09/30/14  . TUBAL LIGATION       Current Outpatient Medications  Medication Sig Dispense Refill  . ACCU-CHEK GUIDE test strip USE TO TEST UP TO TWICE DAILY 100 each 11  . Alcohol Swabs PADS Use with Diabetic testing supplies 100 each 11  . aspirin EC 81 MG tablet Take 1 tablet (81 mg total) by mouth daily. 90 tablet 3  . buPROPion (WELLBUTRIN XL) 150 MG 24 hr tablet Take 150 mg by mouth daily.    . Dapagliflozin-metFORMIN HCl ER 2.10-998 MG TB24 Take 2.5-1,000 mg by mouth daily.    . ferrous sulfate 325 (65 FE) MG tablet Take 325 mg by mouth daily with breakfast.    . furosemide (LASIX) 20 MG tablet Take 2 tablets (40 mg total) by mouth daily. 180 tablet 3  . losartan (COZAAR) 100 MG tablet Take 1 tablet (100 mg total) by mouth daily. 90 tablet 3  . nebivolol (BYSTOLIC) 10 MG tablet Take 1 tablet (10 mg total) by  mouth daily. 90 tablet 0  . potassium chloride (K-DUR) 10 MEQ tablet Take 1 tablet (10 mEq total) by mouth daily. 90 tablet 3  . pravastatin (PRAVACHOL) 10 MG tablet Take 1 tablet (10 mg total) by mouth daily. 90 tablet 1  . TRUEPLUS LANCETS 30G MISC USE TWICE DAILY 100 each 11   No current facility-administered medications for this visit.     Allergies:   Clindamycin/lincomycin; Penicillins; and Amlodipine    Social History:  The patient  reports that she has been smoking cigarettes. She has a 10.00 pack-year smoking history. She has never used smokeless tobacco. She reports that she does not drink alcohol or use drugs.   Family History:  The patient's family history includes Breast cancer in  her maternal aunt, maternal aunt, and mother; Diabetes in her maternal aunt, maternal uncle, and mother; Hypertension in her mother.    ROS:  General:no colds or fevers, no weight changes HEENT:no blurred vision, no congestion CV:see HPI PUL:see HPI Neuro:no syncope, no lightheadedness Endo:+ diabetes, no thyroid disease  Wt Readings from Last 3 Encounters:  05/22/18 (!) 339 lb 6.4 oz (154 kg)  04/14/18 (!) 340 lb 6.4 oz (154.4 kg)  03/06/18 (!) 340 lb 6.4 oz (154.4 kg)     PHYSICAL EXAM: VS:  BP 130/80   Pulse 74   Ht 5' 10.5" (1.791 m)   Wt (!) 339 lb 6.4 oz (154 kg)   LMP 08/08/2014   SpO2 95%   BMI 48.01 kg/m  , BMI Body mass index is 48.01 kg/m. General:Pleasant affect, NAD Skin:Warm and dry, brisk capillary refill HEENT:normocephalic, sclera clear, mucus membranes moist Heart:S1S2 RRR without murmur, gallup, rub or click Lungs:clear without rales, rhonchi, or wheezes Ext:no lower ext edema, 2+ pedal pulses, 2+ radial pulses Neuro:alert and oriented X 3, MAE, follows commands, + facial symmetry    EKG:  EKG is NOT ordered today.   Recent Labs: 12/19/2017: ALT 15; Hemoglobin 13.2; Platelets 217.0; TSH 3.08 04/14/2018: BUN 11; Creatinine, Ser 0.76; NT-Pro BNP 133; Potassium 4.2; Sodium 141    Lipid Panel    Component Value Date/Time   CHOL 176 04/18/2017 1724   TRIG 92.0 04/18/2017 1724   HDL 43.00 04/18/2017 1724   CHOLHDL 4 04/18/2017 1724   VLDL 18.4 04/18/2017 1724   LDLCALC 114 (H) 04/18/2017 1724       Other studies Reviewed: Additional studies/ records that were reviewed today include:  Echo 12/28/17 Study Conclusions  - Procedure narrative: Transthoracic echocardiography. Technically   difficulty study with reduced echo windows. Intravenous contrast   (Definity) was administered. - Left ventricle: The cavity size was normal. There was severe   concentric hypertrophy. Systolic function was normal. The   estimated ejection fraction was in the  range of 60% to 65%. Wall   motion was normal; there were no regional wall motion   abnormalities. Doppler parameters are consistent with abnormal   left ventricular relaxation (grade 1 diastolic dysfunction). The   E/e&' ratio is between 8-15, suggesting indeterminate LV filling   pressure. - Left atrium: The atrium was normal in size. - Inferior vena cava: The vessel was normal in size. The   respirophasic diameter changes were in the normal range (= 50%),   consistent with normal central venous pressure.  Impressions:  - Technically difficult study. Definity contrast given. LVEF   60-65%, severe LVH, normal wall motion, grade 1 DD, indeterminate   LV filling pressure, normal LA size, normal IVC.   ASSESSMENT  AND PLAN:  1.  HTN improved on lasix 40 daily.  Continue lasix 40 daily  Recent labs stable  Follow up with Dr. Marlou Holt in March.   2.  Diastolic HF -chronic improved.  Lungs clear.  3.  UR congestion, instructed on musinex and Nasacort, no pseudoephedrine.    Current medicines are reviewed with the patient today.  The patient Has no concerns regarding medicines.  The following changes have been made:  See above Labs/ tests ordered today include:see above  Disposition:   FU:  see above  Signed, Cecilie Kicks, NP  05/22/2018 8:00 AM    [place] [address], [address] [place], [place] Phone: [phone]; Fax: [phone]

## 2018-05-22 ENCOUNTER — Encounter: Payer: Self-pay | Admitting: Cardiology

## 2018-05-22 ENCOUNTER — Ambulatory Visit: Payer: 59 | Admitting: Cardiology

## 2018-05-22 VITALS — BP 130/80 | HR 74 | Ht 70.5 in | Wt 339.4 lb

## 2018-05-22 DIAGNOSIS — I10 Essential (primary) hypertension: Secondary | ICD-10-CM | POA: Diagnosis not present

## 2018-05-22 DIAGNOSIS — I5032 Chronic diastolic (congestive) heart failure: Secondary | ICD-10-CM | POA: Diagnosis not present

## 2018-05-22 DIAGNOSIS — J988 Other specified respiratory disorders: Secondary | ICD-10-CM

## 2018-05-22 NOTE — Patient Instructions (Addendum)
Medication Instructions:  Your physician recommends that you continue on your current medications as directed. Please refer to the Current Medication list given to you today.  If you need a refill on your cardiac medications before your next appointment, please call your pharmacy.   Lab work: None ordered  If you have labs (blood work) drawn today and your tests are completely normal, you will receive your results only by: Marland Kitchen MyChart Message (if you have MyChart) OR . A paper copy in the mail If you have any lab test that is abnormal or we need to change your treatment, we will call you to review the results.  Testing/Procedures: None ordered  Follow-Up: At [facility], you and your health needs are our priority.  As part of our continuing mission to provide you with exceptional heart care, we have created designated Provider Care Teams.  These Care Teams include your primary Cardiologist (physician) and Advanced Practice Providers (APPs -  Physician Assistants and Nurse Practitioners) who all work together to provide you with the care you need, when you need it. You will need a follow up appointment in 3 months.  08/25/18 8:00 WITH DR. Marlou Porch   Any Other Special Instructions Will Be Listed Below (If Applicable). Muccinex as needed for congesetion Nasocort Nasal Spray as needed for congestion

## 2018-05-24 ENCOUNTER — Telehealth: Payer: Self-pay | Admitting: Internal Medicine

## 2018-05-24 MED FILL — FUROSEMIDE 20 MG TABS: 20 | 90 days supply | Qty: 90 | Fill #1

## 2018-05-24 MED FILL — XIGDUO XR 2.5-1000 MG TB24: 2.5-1000 | 90 days supply | Qty: 180 | Fill #1

## 2018-05-24 MED FILL — ACCU-CHEK GUIDE TEST STRIP: 50 days supply | Qty: 100 | Fill #0

## 2018-05-24 NOTE — Telephone Encounter (Signed)
Copied from [place] [id number]. Topic: Quick Communication - Rx Refill/Question >> May 24, 2018  3:06 PM Reyne Dumas L wrote: Medication:  nebivolol (BYSTOLIC) 10 MG tablet  Pt states that she is wanting to get samples of this medication.  Pt states she has been out for two days Pt can be reached at [phone]

## 2018-05-29 NOTE — Telephone Encounter (Signed)
Left detailed message for pt that samples are on my desk.

## 2018-06-28 MED FILL — POTASSIUM CHL ER M10 TABLET: 10 | 90 days supply | Qty: 90 | Fill #1

## 2018-07-14 MED FILL — FUROSEMIDE 20 MG TABS: 20 | 90 days supply | Qty: 180 | Fill #0

## 2018-07-18 ENCOUNTER — Encounter: Payer: Self-pay | Admitting: Internal Medicine

## 2018-07-18 ENCOUNTER — Other Ambulatory Visit (INDEPENDENT_AMBULATORY_CARE_PROVIDER_SITE_OTHER): Payer: 59

## 2018-07-18 ENCOUNTER — Ambulatory Visit (INDEPENDENT_AMBULATORY_CARE_PROVIDER_SITE_OTHER): Payer: 59 | Admitting: Internal Medicine

## 2018-07-18 VITALS — BP 128/82 | HR 78 | Temp 98.0°F | Resp 16 | Ht 70.5 in | Wt 331.0 lb

## 2018-07-18 DIAGNOSIS — E785 Hyperlipidemia, unspecified: Secondary | ICD-10-CM

## 2018-07-18 DIAGNOSIS — E118 Type 2 diabetes mellitus with unspecified complications: Secondary | ICD-10-CM | POA: Diagnosis not present

## 2018-07-18 DIAGNOSIS — I10 Essential (primary) hypertension: Secondary | ICD-10-CM

## 2018-07-18 DIAGNOSIS — Z1231 Encounter for screening mammogram for malignant neoplasm of breast: Secondary | ICD-10-CM

## 2018-07-18 DIAGNOSIS — Z0000 Encounter for general adult medical examination without abnormal findings: Secondary | ICD-10-CM

## 2018-07-18 DIAGNOSIS — Z23 Encounter for immunization: Secondary | ICD-10-CM | POA: Diagnosis not present

## 2018-07-18 LAB — CBC WITH DIFFERENTIAL/PLATELET
BASOS PCT: 1.3 % (ref 0.0–3.0)
Basophils Absolute: 0.1 10*3/uL (ref 0.0–0.1)
EOS ABS: 0.2 10*3/uL (ref 0.0–0.7)
EOS PCT: 2.9 % (ref 0.0–5.0)
HEMATOCRIT: 45.9 % (ref 36.0–46.0)
HEMOGLOBIN: 15.1 g/dL — AB (ref 12.0–15.0)
LYMPHS PCT: 35.5 % (ref 12.0–46.0)
Lymphs Abs: 2.4 10*3/uL (ref 0.7–4.0)
MCHC: 32.8 g/dL (ref 30.0–36.0)
MCV: 88.2 fl (ref 78.0–100.0)
Monocytes Absolute: 0.5 10*3/uL (ref 0.1–1.0)
Monocytes Relative: 7.2 % (ref 3.0–12.0)
Neutro Abs: 3.5 10*3/uL (ref 1.4–7.7)
Neutrophils Relative %: 53.1 % (ref 43.0–77.0)
Platelets: 255 10*3/uL (ref 150.0–400.0)
RBC: 5.2 Mil/uL — AB (ref 3.87–5.11)
RDW: 14.6 % (ref 11.5–15.5)
WBC: 6.6 10*3/uL (ref 4.0–10.5)

## 2018-07-18 LAB — COMPREHENSIVE METABOLIC PANEL
ALBUMIN: 4 g/dL (ref 3.5–5.2)
ALK PHOS: 151 U/L — AB (ref 39–117)
ALT: 13 U/L (ref 0–35)
AST: 14 U/L (ref 0–37)
BUN: 12 mg/dL (ref 6–23)
CHLORIDE: 102 meq/L (ref 96–112)
CO2: 29 mEq/L (ref 19–32)
Calcium: 9.9 mg/dL (ref 8.4–10.5)
Creatinine, Ser: 0.82 mg/dL (ref 0.40–1.20)
GFR: 87.57 mL/min (ref >60.00)
Glucose, Bld: 115 mg/dL — ABNORMAL HIGH (ref 70–99)
Potassium: 4 mEq/L (ref 3.5–5.1)
SODIUM: 140 meq/L (ref 135–145)
Total Bilirubin: 0.6 mg/dL (ref 0.2–1.2)
Total Protein: 7.8 g/dL (ref 6.0–8.3)

## 2018-07-18 LAB — MICROALBUMIN / CREATININE URINE RATIO
Creatinine,U: 138.1 mg/dL
MICROALB UR: 1.2 mg/dL (ref 0.0–1.9)
Microalb Creat Ratio: 0.9 mg/g (ref 0.0–30.0)

## 2018-07-18 LAB — URINALYSIS, ROUTINE W REFLEX MICROSCOPIC
BILIRUBIN URINE: NEGATIVE
HGB URINE DIPSTICK: NEGATIVE
Ketones, ur: NEGATIVE
Leukocytes,Ua: NEGATIVE
Nitrite: NEGATIVE
RBC / HPF: NONE SEEN (ref 0–?)
Specific Gravity, Urine: 1.02 (ref 1.000–1.030)
TOTAL PROTEIN, URINE-UPE24: NEGATIVE
Urine Glucose: 500 — AB
Urobilinogen, UA: 0.2 (ref 0.0–1.0)
pH: 6 (ref 5.0–8.0)

## 2018-07-18 LAB — HEMOGLOBIN A1C: HEMOGLOBIN A1C: 7.5 % — AB (ref 4.6–6.5)

## 2018-07-18 LAB — LIPID PANEL
CHOLESTEROL: 181 mg/dL (ref 0–200)
HDL: 40.6 mg/dL (ref >39.00)
LDL CALC: 116 mg/dL — AB (ref 0–99)
NonHDL: 140.6
Total CHOL/HDL Ratio: 4
Triglycerides: 124 mg/dL (ref 0.0–149.0)
VLDL: 24.8 mg/dL (ref 0.0–40.0)

## 2018-07-18 LAB — TSH: TSH: 4.15 u[IU]/mL (ref 0.35–4.50)

## 2018-07-18 MED ORDER — DAPAGLIFLOZIN PRO-METFORMIN ER 5-1000 MG PO TB24: 2.0000 | ORAL_TABLET | Freq: Every day | ORAL | 1 refills | Status: DC

## 2018-07-18 NOTE — Progress Notes (Signed)
Subjective:  Patient ID: Lorraine Holt, female    DOB: 08/29/1965  Age: 53 y.o. MRN: [id number]  CC: Hypertension; Hypothyroidism; and Annual Exam   HPI Lorraine Holt presents for a CPX.  She tells me her blood pressure and her blood sugars have been well controlled.  She is active and denies any recent episodes of CP, DOE, palpitations, edema, or fatigue.  Outpatient Medications Prior to Visit  Medication Sig Dispense Refill  . ACCU-CHEK GUIDE test strip USE TO TEST UP TO TWICE DAILY 100 each 11  . Alcohol Swabs PADS Use with Diabetic testing supplies 100 each 11  . aspirin EC 81 MG tablet Take 1 tablet (81 mg total) by mouth daily. 90 tablet 3  . buPROPion (WELLBUTRIN XL) 150 MG 24 hr tablet Take 150 mg by mouth daily.    . ferrous sulfate 325 (65 FE) MG tablet Take 325 mg by mouth daily with breakfast.    . furosemide (LASIX) 20 MG tablet Take 2 tablets (40 mg total) by mouth daily. 180 tablet 3  . losartan (COZAAR) 100 MG tablet Take 1 tablet (100 mg total) by mouth daily. 90 tablet 3  . nebivolol (BYSTOLIC) 10 MG tablet Take 1 tablet (10 mg total) by mouth daily. 90 tablet 0  . potassium chloride (K-DUR) 10 MEQ tablet Take 1 tablet (10 mEq total) by mouth daily. 90 tablet 3  . TRUEPLUS LANCETS 30G MISC USE TWICE DAILY 100 each 11  . Dapagliflozin-metFORMIN HCl ER 2.10-998 MG TB24 Take 2.5-1,000 mg by mouth daily.    . pravastatin (PRAVACHOL) 10 MG tablet Take 1 tablet (10 mg total) by mouth daily. 90 tablet 1   No facility-administered medications prior to visit.     ROS Review of Systems  Constitutional: Negative for appetite change, diaphoresis, fatigue and unexpected weight change.  HENT: Negative.   Respiratory: Negative for cough, chest tightness, shortness of breath and wheezing.   Cardiovascular: Negative for chest pain, palpitations and leg swelling.  Gastrointestinal: Negative for abdominal pain, constipation, diarrhea, nausea and vomiting.  Endocrine: Negative for  polydipsia, polyphagia and polyuria.  Genitourinary: Negative for decreased urine volume, difficulty urinating, dysuria, frequency and urgency.  Musculoskeletal: Negative for arthralgias and myalgias.  Skin: Negative.  Negative for color change and pallor.  Neurological: Negative.  Negative for dizziness, weakness, light-headedness, numbness and headaches.  Hematological: Negative for adenopathy. Does not bruise/bleed easily.  Psychiatric/Behavioral: Negative.     Objective:  BP 128/82 (BP Location: Left Arm, Patient Position: Sitting, Cuff Size: Large)   Pulse 78   Temp 98 F (36.7 C) (Oral)   Resp 16   Ht 5' 10.5" (1.791 m)   Wt (!) 331 lb (150.1 kg)   LMP 08/08/2014   SpO2 97%   BMI 46.82 kg/m   BP Readings from Last 3 Encounters:  07/18/18 128/82  05/22/18 130/80  04/14/18 (!) 136/98    Wt Readings from Last 3 Encounters:  07/18/18 (!) 331 lb (150.1 kg)  05/22/18 (!) 339 lb 6.4 oz (154 kg)  04/14/18 (!) 340 lb 6.4 oz (154.4 kg)    Physical Exam Constitutional:      Appearance: She is obese. She is not ill-appearing or diaphoretic.  HENT:     Nose: Nose normal. No congestion or rhinorrhea.     Mouth/Throat:     Mouth: Mucous membranes are moist.     Pharynx: Oropharynx is clear. No oropharyngeal exudate or posterior oropharyngeal erythema.  Eyes:  General: No scleral icterus.    Conjunctiva/sclera: Conjunctivae normal.  Neck:     Musculoskeletal: Normal range of motion and neck supple. No neck rigidity or muscular tenderness.  Cardiovascular:     Rate and Rhythm: Normal rate and regular rhythm.     Heart sounds: No murmur. No gallop.   Pulmonary:     Effort: Pulmonary effort is normal. No respiratory distress.     Breath sounds: No stridor. No wheezing, rhonchi or rales.  Abdominal:     General: Bowel sounds are normal.     Palpations: There is no hepatomegaly, splenomegaly or mass.     Tenderness: There is no abdominal tenderness. There is no guarding.    Musculoskeletal: Normal range of motion.        General: No swelling.     Right lower leg: No edema.     Left lower leg: No edema.  Lymphadenopathy:     Cervical: No cervical adenopathy.  Skin:    General: Skin is warm and dry.     Coloration: Skin is not pale.     Findings: No rash.  Neurological:     General: No focal deficit present.     Mental Status: She is oriented to person, place, and time. Mental status is at baseline.     Lab Results  Component Value Date   WBC 6.6 07/18/2018   HGB 15.1 (H) 07/18/2018   HCT 45.9 07/18/2018   PLT 255.0 07/18/2018   GLUCOSE 115 (H) 07/18/2018   CHOL 181 07/18/2018   TRIG 124.0 07/18/2018   HDL 40.60 07/18/2018   LDLCALC 116 (H) 07/18/2018   ALT 13 07/18/2018   AST 14 07/18/2018   NA 140 07/18/2018   K 4.0 07/18/2018   CL 102 07/18/2018   CREATININE 0.82 07/18/2018   BUN 12 07/18/2018   CO2 29 07/18/2018   TSH 4.15 07/18/2018   HGBA1C 7.5 (H) 07/18/2018   MICROALBUR 1.2 07/18/2018    Dg Chest 2 View  Result Date: 12/20/2017 CLINICAL DATA:  Cough for 2 days EXAM: CHEST - 2 VIEW COMPARISON:  05/25/2016 FINDINGS: Mild cardiomegaly. Diffuse interstitial prominence throughout the lungs. No confluent opacity or effusion. No acute bony abnormality. IMPRESSION: Diffuse interstitial prominence, increased since prior study. This could reflect bronchitis or chronic interstitial disease. Electronically Signed   By: Rolm Baptise M.D.   On: 12/20/2017 08:00    Assessment & Plan:   Lorraine Holt was seen today for hypertension, hypothyroidism and annual exam.  Diagnoses and all orders for this visit:  Essential hypertension, benign- Her blood pressure is adequately well controlled.  Labs are negative for secondary causes or endorgan damage.  Will continue the current combination of nebivolol, an ARB, and a loop diuretic. -     Comprehensive metabolic panel; Future -     CBC with Differential/Platelet; Future -     TSH; Future -      Urinalysis, Routine w reflex microscopic; Future  Type II diabetes mellitus with manifestations ([place])- Her A1c is better at 7.5% but is still too high.  I have asked her to double the dose of metformin and the SGLT2 inhibitor. -     Comprehensive metabolic panel; Future -     Hemoglobin A1c; Future -     Microalbumin / creatinine urine ratio; Future -     Dapagliflozin-metFORMIN HCl ER (XIGDUO XR) 10-998 MG TB24; Take 2 tablets by mouth daily.  Routine general medical examination at a health care facility- Exam  completed, labs reviewed, vaccines reviewed and updated, colon cancer screening is up-to-date, Pap is up-to-date, she is referred for screening mammogram. -     Lipid panel; Future  Visit for screening mammogram -     MM DIGITAL SCREENING BILATERAL; Future  Need for Tdap vaccination -     Tdap vaccine greater than or equal to 7yo IM  Hyperlipidemia with target LDL less than 100- She has not achieved her LDL goal. I have asked her to restart the statin. -     pravastatin (PRAVACHOL) 10 MG tablet; Take 1 tablet (10 mg total) by mouth daily.   I have discontinued [place] HCl ER. I am also having her start on Dapagliflozin-metFORMIN HCl ER. Additionally, I am having her maintain her aspirin EC, TRUEPLUS LANCETS 30G, Alcohol Swabs, losartan, nebivolol, ferrous sulfate, potassium chloride, furosemide, ACCU-CHEK GUIDE, buPROPion, and pravastatin.  Meds ordered this encounter  Medications  . Dapagliflozin-metFORMIN HCl ER (XIGDUO XR) 10-998 MG TB24    Sig: Take 2 tablets by mouth daily.    Dispense:  180 tablet    Refill:  1  . pravastatin (PRAVACHOL) 10 MG tablet    Sig: Take 1 tablet (10 mg total) by mouth daily.    Dispense:  90 tablet    Refill:  1     Follow-up: Return in about 6 months (around 01/16/2019).  Scarlette Calico, MD

## 2018-07-18 NOTE — Patient Instructions (Signed)

## 2018-07-19 ENCOUNTER — Encounter: Payer: Self-pay | Admitting: Internal Medicine

## 2018-07-19 ENCOUNTER — Other Ambulatory Visit: Payer: Self-pay | Admitting: Internal Medicine

## 2018-07-19 MED ORDER — PRAVASTATIN SODIUM 10 MG PO TABS: 10.0000 mg | ORAL_TABLET | Freq: Every day | ORAL | 1 refills | Status: DC

## 2018-07-20 ENCOUNTER — Other Ambulatory Visit: Payer: Self-pay | Admitting: Internal Medicine

## 2018-07-20 DIAGNOSIS — E785 Hyperlipidemia, unspecified: Secondary | ICD-10-CM

## 2018-07-20 MED ORDER — PRAVASTATIN SODIUM 40 MG PO TABS: 40.0000 mg | ORAL_TABLET | Freq: Every day | ORAL | 1 refills | Status: DC

## 2018-07-20 MED FILL — PRAVASTATIN NA 40 MG TAB: 40 | 90 days supply | Qty: 90 | Fill #0

## 2018-07-25 ENCOUNTER — Other Ambulatory Visit: Payer: Self-pay | Admitting: Internal Medicine

## 2018-07-25 MED FILL — XIGDUO XR 5 MG-1,000 MG TAB: 5-1000 | 90 days supply | Qty: 180 | Fill #0

## 2018-08-25 ENCOUNTER — Ambulatory Visit: Payer: 59 | Admitting: Cardiology

## 2018-11-02 ENCOUNTER — Encounter: Payer: Self-pay | Admitting: Internal Medicine

## 2018-11-02 ENCOUNTER — Telehealth: Payer: Self-pay | Admitting: Internal Medicine

## 2018-11-02 NOTE — Telephone Encounter (Signed)
Copied from [id number]. Topic: General - Other >> Nov 02, 2018 11:59 AM Carolyn Stare wrote:   Pt call to ask if the office has any samples of the below meds    Dapagliflozin-metFORMIN HCl ER (XIGDUO XR) 10-998 MG TB24      nebivolol (BYSTOLIC) 10 MG tablet

## 2018-11-02 NOTE — Telephone Encounter (Signed)
She is on bystolic 10 mg and Xigduo XR 10-998 mg and we do not have either.

## 2018-11-12 ENCOUNTER — Encounter: Payer: Self-pay | Admitting: Internal Medicine

## 2018-11-13 MED FILL — POTASSIUM CHL ER M10 TABLET: 10 | 30 days supply | Qty: 30 | Fill #2

## 2018-11-13 MED FILL — FUROSEMIDE 20 MG TABS: 20 | 90 days supply | Qty: 180 | Fill #1

## 2018-12-08 ENCOUNTER — Encounter: Payer: Self-pay | Admitting: Internal Medicine

## 2019-05-10 ENCOUNTER — Encounter: Payer: Self-pay | Admitting: Internal Medicine

## 2019-05-10 ENCOUNTER — Other Ambulatory Visit (INDEPENDENT_AMBULATORY_CARE_PROVIDER_SITE_OTHER): Payer: 59

## 2019-05-10 ENCOUNTER — Ambulatory Visit (INDEPENDENT_AMBULATORY_CARE_PROVIDER_SITE_OTHER): Payer: 59 | Admitting: Internal Medicine

## 2019-05-10 ENCOUNTER — Other Ambulatory Visit: Payer: Self-pay

## 2019-05-10 VITALS — BP 162/102 | HR 86 | Temp 97.7°F | Resp 16 | Ht 70.5 in | Wt 330.0 lb

## 2019-05-10 DIAGNOSIS — I110 Hypertensive heart disease with heart failure: Secondary | ICD-10-CM

## 2019-05-10 DIAGNOSIS — I5032 Chronic diastolic (congestive) heart failure: Secondary | ICD-10-CM

## 2019-05-10 DIAGNOSIS — I10 Essential (primary) hypertension: Secondary | ICD-10-CM

## 2019-05-10 DIAGNOSIS — J411 Mucopurulent chronic bronchitis: Secondary | ICD-10-CM | POA: Insufficient documentation

## 2019-05-10 DIAGNOSIS — E118 Type 2 diabetes mellitus with unspecified complications: Secondary | ICD-10-CM

## 2019-05-10 DIAGNOSIS — E785 Hyperlipidemia, unspecified: Secondary | ICD-10-CM

## 2019-05-10 DIAGNOSIS — I739 Peripheral vascular disease, unspecified: Secondary | ICD-10-CM

## 2019-05-10 LAB — CBC WITH DIFFERENTIAL/PLATELET
Basophils Absolute: 0.1 10*3/uL (ref 0.0–0.1)
Basophils Relative: 1.2 % (ref 0.0–3.0)
Eosinophils Absolute: 0.3 10*3/uL (ref 0.0–0.7)
Eosinophils Relative: 4.4 % (ref 0.0–5.0)
HCT: 44 % (ref 36.0–46.0)
Hemoglobin: 14.2 g/dL (ref 12.0–15.0)
Lymphocytes Relative: 33.2 % (ref 12.0–46.0)
Lymphs Abs: 1.9 10*3/uL (ref 0.7–4.0)
MCHC: 32.2 g/dL (ref 30.0–36.0)
MCV: 88.4 fl (ref 78.0–100.0)
Monocytes Absolute: 0.5 10*3/uL (ref 0.1–1.0)
Monocytes Relative: 7.9 % (ref 3.0–12.0)
Neutro Abs: 3.1 10*3/uL (ref 1.4–7.7)
Neutrophils Relative %: 53.3 % (ref 43.0–77.0)
Platelets: 215 10*3/uL (ref 150.0–400.0)
RBC: 4.98 Mil/uL (ref 3.87–5.11)
RDW: 14.7 % (ref 11.5–15.5)
WBC: 5.7 10*3/uL (ref 4.0–10.5)

## 2019-05-10 LAB — POCT GLYCOSYLATED HEMOGLOBIN (HGB A1C): Hemoglobin A1C: 6.8 % — AB (ref 4.0–5.6)

## 2019-05-10 LAB — POCT GLUCOSE (DEVICE FOR HOME USE): Glucose Fasting, POC: 94 mg/dL (ref 70–99)

## 2019-05-10 LAB — BASIC METABOLIC PANEL
BUN: 10 mg/dL (ref 6–23)
CO2: 29 mEq/L (ref 19–32)
Calcium: 9.1 mg/dL (ref 8.4–10.5)
Chloride: 105 mEq/L (ref 96–112)
Creatinine, Ser: 0.71 mg/dL (ref 0.40–1.20)
GFR: 103.1 mL/min (ref >60.00)
Glucose, Bld: 99 mg/dL (ref 70–99)
Potassium: 3.8 mEq/L (ref 3.5–5.1)
Sodium: 141 mEq/L (ref 135–145)

## 2019-05-10 MED ORDER — PRAVASTATIN SODIUM 40 MG PO TABS: 40.0000 mg | ORAL_TABLET | Freq: Every day | ORAL | 1 refills | Status: DC

## 2019-05-10 MED ORDER — ASPIRIN EC 81 MG PO TBEC: 81.0000 mg | DELAYED_RELEASE_TABLET | Freq: Every day | ORAL | 1 refills | Status: DC

## 2019-05-10 MED ORDER — EDARBYCLOR 40-25 MG PO TABS: 1.0000 | ORAL_TABLET | Freq: Every day | ORAL | 0 refills | Status: DC

## 2019-05-10 MED ORDER — METFORMIN HCL 500 MG PO TABS: 500.0000 mg | ORAL_TABLET | Freq: Two times a day (BID) | ORAL | 1 refills | Status: DC

## 2019-05-10 NOTE — Patient Instructions (Signed)

## 2019-05-10 NOTE — Progress Notes (Signed)
Subjective:  Patient ID: Lorraine Holt, female    DOB: 15-Mar-1966  Age: 53 y.o. MRN: [id number]  CC: Hypertension and Diabetes  This visit occurred during the SARS-CoV-2 public health emergency.  Safety protocols were in place, including screening questions prior to the visit, additional usage of staff PPE, and extensive cleaning of exam room while observing appropriate contact time as indicated for disinfecting solutions.    HPI Lorraine Holt presents for f/up - She tells me her blood pressure has not recently been well controlled.  Her blood pressure at home was recently 170/107.  She is not taking anything for hypertension and diabetes because she lost her insurance earlier this year.  She has had a few headaches recently but denies blurred vision, chest pain, shortness of breath, palpitations, edema, or fatigue.  She tells me her blood sugars have been well controlled and she denies polys.  Outpatient Medications Prior to Visit  Medication Sig Dispense Refill  . ACCU-CHEK GUIDE test strip USE TO TEST UP TO TWICE DAILY 100 each 11  . Alcohol Swabs PADS Use with Diabetic testing supplies 100 each 11  . buPROPion (WELLBUTRIN XL) 150 MG 24 hr tablet Take 150 mg by mouth daily.    . TRUEPLUS LANCETS 30G MISC USE TWICE DAILY 100 each 11  . aspirin EC 81 MG tablet Take 1 tablet (81 mg total) by mouth daily. 90 tablet 3  . pravastatin (PRAVACHOL) 40 MG tablet Take 1 tablet (40 mg total) by mouth daily. 90 tablet 1  . ferrous sulfate 325 (65 FE) MG tablet Take 325 mg by mouth daily with breakfast.    . potassium chloride (K-DUR) 10 MEQ tablet Take 1 tablet (10 mEq total) by mouth daily. (Patient not taking: Reported on 05/10/2019) 90 tablet 3  . Dapagliflozin-metFORMIN HCl ER (XIGDUO XR) 10-998 MG TB24 Take 2 tablets by mouth daily. (Patient not taking: Reported on 05/10/2019) 180 tablet 1  . furosemide (LASIX) 20 MG tablet Take 2 tablets (40 mg total) by mouth daily. (Patient not taking: Reported on  05/10/2019) 180 tablet 3  . losartan (COZAAR) 100 MG tablet Take 1 tablet (100 mg total) by mouth daily. (Patient not taking: Reported on 05/10/2019) 90 tablet 3  . metFORMIN (GLUCOPHAGE) 500 MG tablet TAKE 1 TABLET BY MOUTH TWICE A DAY WITH MEALS (Patient not taking: Reported on 05/10/2019) 180 tablet 1  . nebivolol (BYSTOLIC) 10 MG tablet Take 1 tablet (10 mg total) by mouth daily. (Patient not taking: Reported on 05/10/2019) 90 tablet 0   No facility-administered medications prior to visit.     ROS Review of Systems  Constitutional: Positive for unexpected weight change (wt gain). Negative for appetite change, diaphoresis and fatigue.  HENT: Negative.   Eyes: Negative for visual disturbance.  Respiratory: Negative for cough, chest tightness, shortness of breath and wheezing.   Cardiovascular: Negative for chest pain, palpitations and leg swelling.  Gastrointestinal: Negative for abdominal pain, constipation, diarrhea, nausea and vomiting.  Endocrine: Negative for polydipsia, polyphagia and polyuria.  Genitourinary: Negative.  Negative for difficulty urinating and hematuria.  Musculoskeletal: Negative for arthralgias and myalgias.  Skin: Negative.  Negative for color change and pallor.  Neurological: Positive for headaches. Negative for dizziness, weakness and light-headedness.  Hematological: Negative for adenopathy. Does not bruise/bleed easily.  Psychiatric/Behavioral: Negative.     Objective:  BP (!) 162/102 (BP Location: Left Arm, Patient Position: Sitting, Cuff Size: Large)   Pulse 86   Temp 97.7 F (36.5 C) (Oral)  Resp 16   Ht 5' 10.5" (1.791 m)   Wt (!) 330 lb (149.7 kg)   LMP 08/08/2014   SpO2 97%   BMI 46.68 kg/m   BP Readings from Last 3 Encounters:  05/10/19 (!) 162/102  09/18/14 113/68  09/12/14 124/84    Wt Readings from Last 3 Encounters:  05/10/19 (!) 330 lb (149.7 kg)  09/16/14 (!) 315 lb 6 oz (143.1 kg)  09/12/14 (!) 315 lb 6 oz (143.1 kg)     Physical Exam Vitals signs reviewed.  Constitutional:      Appearance: She is obese.  HENT:     Nose: Nose normal.     Mouth/Throat:     Mouth: Mucous membranes are moist.  Eyes:     General: No scleral icterus.    Conjunctiva/sclera: Conjunctivae normal.  Neck:     Musculoskeletal: Neck supple.  Cardiovascular:     Rate and Rhythm: Normal rate and regular rhythm.     Heart sounds: No murmur. No gallop.   Pulmonary:     Effort: Pulmonary effort is normal.     Breath sounds: No wheezing, rhonchi or rales.  Abdominal:     General: Abdomen is protuberant. Bowel sounds are normal. There is no distension.     Palpations: Abdomen is soft. There is no hepatomegaly or splenomegaly.     Tenderness: There is no abdominal tenderness.  Musculoskeletal:     Right lower leg: Edema (trace pitting) present.     Left lower leg: Edema (trace pitting) present.  Lymphadenopathy:     Cervical: No cervical adenopathy.  Skin:    General: Skin is warm and dry.  Neurological:     General: No focal deficit present.     Mental Status: She is alert.  Psychiatric:        Mood and Affect: Mood normal.        Behavior: Behavior normal.     Lab Results  Component Value Date   WBC 5.7 05/10/2019   HGB 14.2 05/10/2019   HCT 44.0 05/10/2019   PLT 215.0 05/10/2019   GLUCOSE 99 05/10/2019   CHOL 181 07/18/2018   TRIG 124.0 07/18/2018   HDL 40.60 07/18/2018   LDLCALC 116 (H) 07/18/2018   ALT 13 07/18/2018   AST 14 07/18/2018   NA 141 05/10/2019   K 3.8 05/10/2019   CL 105 05/10/2019   CREATININE 0.71 05/10/2019   BUN 10 05/10/2019   CO2 29 05/10/2019   TSH 4.15 07/18/2018   HGBA1C 6.8 (A) 05/10/2019   MICROALBUR 1.2 07/18/2018    Dg Chest 2 View  Result Date: 12/20/2017 CLINICAL DATA:  Cough for 2 days EXAM: CHEST - 2 VIEW COMPARISON:  05/25/2016 FINDINGS: Mild cardiomegaly. Diffuse interstitial prominence throughout the lungs. No confluent opacity or effusion. No acute bony abnormality.  IMPRESSION: Diffuse interstitial prominence, increased since prior study. This could reflect bronchitis or chronic interstitial disease. Electronically Signed   By: Rolm Baptise M.D.   On: 12/20/2017 08:00    Assessment & Plan:   Lorraine Holt was seen today for hypertension and diabetes.  Diagnoses and all orders for this visit:  Essential hypertension, benign- Her blood pressure is not adequately well controlled.  I gave her samples of Edarbyclor. -     CBC with Differential; Future -     Basic metabolic panel; Future -     Azilsartan-Chlorthalidone (EDARBYCLOR) 40-25 MG TABS; Take 1 tablet by mouth daily.  Type II diabetes mellitus with manifestations ([place])-  Her A1c is at 6.8%.  I recommended that she take 500 mg of metformin twice a day. -     Basic metabolic panel; Future -     Cancel: Hemoglobin A1c; Future -     Azilsartan-Chlorthalidone (EDARBYCLOR) 40-25 MG TABS; Take 1 tablet by mouth daily. -     HM Diabetes Foot Exam -     aspirin EC 81 MG tablet; Take 1 tablet (81 mg total) by mouth daily. -     POCT HgB A1C -     POCT Glucose (Device for Home Use) -     metFORMIN (GLUCOPHAGE) 500 MG tablet; Take 1 tablet (500 mg total) by mouth 2 (two) times daily with a meal.  Mucopurulent chronic bronchitis (HCC)  Chronic diastolic heart failure ([place])- She has mild peripheral edema but no signs of pulmonary edema.  I recommended that she start taking an ARB and thiazide diuretic. -     Azilsartan-Chlorthalidone (EDARBYCLOR) 40-25 MG TABS; Take 1 tablet by mouth daily.  LVH (left ventricular hypertrophy) due to hypertensive disease, with heart failure (HCC) -     Azilsartan-Chlorthalidone (EDARBYCLOR) 40-25 MG TABS; Take 1 tablet by mouth daily.  Claudication of both lower extremities ([place])- I have asked her to undergo ABIs to screen for PAD. -     VAS [place] ABI WITH/WO TBI; Future -     aspirin EC 81 MG tablet; Take 1 tablet (81 mg total) by mouth daily.  Type 2 diabetes mellitus with  complication, without long-term current use of insulin (HCC)  Hyperlipidemia with target LDL less than 100 -     pravastatin (PRAVACHOL) 40 MG tablet; Take 1 tablet (40 mg total) by mouth daily.   I have discontinued Nahlah Sivils. Krog's losartan, nebivolol, furosemide, and Dapagliflozin-metFORMIN HCl ER. I have also changed her metFORMIN. Additionally, I am having her start on Edarbyclor. Lastly, I am having her maintain her TRUEplus Lancets 30G, Alcohol Swabs, ferrous sulfate, potassium chloride, Accu-Chek Guide, buPROPion, aspirin EC, and pravastatin.  Meds ordered this encounter  Medications  . Azilsartan-Chlorthalidone (EDARBYCLOR) 40-25 MG TABS    Sig: Take 1 tablet by mouth daily.    Dispense:  126 tablet    Refill:  0  . aspirin EC 81 MG tablet    Sig: Take 1 tablet (81 mg total) by mouth daily.    Dispense:  90 tablet    Refill:  1  . pravastatin (PRAVACHOL) 40 MG tablet    Sig: Take 1 tablet (40 mg total) by mouth daily.    Dispense:  90 tablet    Refill:  1  . metFORMIN (GLUCOPHAGE) 500 MG tablet    Sig: Take 1 tablet (500 mg total) by mouth 2 (two) times daily with a meal.    Dispense:  180 tablet    Refill:  1     Follow-up: Return in about 6 weeks (around 06/21/2019).  Scarlette Calico, MD

## 2019-05-16 ENCOUNTER — Telehealth (HOSPITAL_COMMUNITY): Payer: Self-pay | Admitting: *Deleted

## 2019-05-16 NOTE — Telephone Encounter (Signed)

## 2019-05-17 ENCOUNTER — Other Ambulatory Visit: Payer: Self-pay

## 2019-05-17 ENCOUNTER — Encounter: Payer: Self-pay | Admitting: Internal Medicine

## 2019-05-17 ENCOUNTER — Ambulatory Visit (HOSPITAL_COMMUNITY)
Admission: RE | Admit: 2019-05-17 | Discharge: 2019-05-17 | Disposition: A | Discharge status: Home / Self Care | Payer: 59 | Source: Ambulatory Visit | Attending: Family | Admitting: Family

## 2019-05-17 DIAGNOSIS — I739 Peripheral vascular disease, unspecified: Secondary | ICD-10-CM | POA: Diagnosis not present

## 2019-06-13 ENCOUNTER — Encounter: Payer: Self-pay | Admitting: Internal Medicine

## 2019-06-13 ENCOUNTER — Other Ambulatory Visit: Payer: Self-pay

## 2019-06-13 ENCOUNTER — Ambulatory Visit (INDEPENDENT_AMBULATORY_CARE_PROVIDER_SITE_OTHER): Payer: Self-pay | Admitting: Internal Medicine

## 2019-06-13 VITALS — BP 116/68 | HR 96 | Temp 97.9°F | Resp 16 | Ht 70.5 in | Wt 321.4 lb

## 2019-06-13 DIAGNOSIS — I110 Hypertensive heart disease with heart failure: Secondary | ICD-10-CM

## 2019-06-13 DIAGNOSIS — I10 Essential (primary) hypertension: Secondary | ICD-10-CM

## 2019-06-13 LAB — BASIC METABOLIC PANEL
BUN: 15 mg/dL (ref 6–23)
CO2: 33 mEq/L — ABNORMAL HIGH (ref 19–32)
Calcium: 9.7 mg/dL (ref 8.4–10.5)
Chloride: 100 mEq/L (ref 96–112)
Creatinine, Ser: 0.79 mg/dL (ref 0.40–1.20)
GFR: 91.12 mL/min (ref >60.00)
Glucose, Bld: 194 mg/dL — ABNORMAL HIGH (ref 70–99)
Potassium: 3.6 mEq/L (ref 3.5–5.1)
Sodium: 138 mEq/L (ref 135–145)

## 2019-06-13 MED ORDER — INDAPAMIDE 1.25 MG PO TABS: 1.2500 mg | ORAL_TABLET | Freq: Every day | ORAL | 1 refills | Status: DC

## 2019-06-13 NOTE — Progress Notes (Signed)
Subjective:  Patient ID: Lorraine Holt, female    DOB: 28-Jan-1966  Age: 54 y.o. MRN: [id number]  CC: Hypertension   HPI Lorraine Holt presents for a BP check - She complains of recent episodes of orthostatic dizziness.  She has been working on her lifestyle modifications and has been able to lose weight.  She has had no recent episodes of DOE, CP, palpitations, edema, or fatigue.  Outpatient Medications Prior to Visit  Medication Sig Dispense Refill  . ACCU-CHEK GUIDE test strip USE TO TEST UP TO TWICE DAILY 100 each 11  . Alcohol Swabs PADS Use with Diabetic testing supplies 100 each 11  . aspirin EC 81 MG tablet Take 1 tablet (81 mg total) by mouth daily. 90 tablet 1  . buPROPion (WELLBUTRIN XL) 150 MG 24 hr tablet Take 150 mg by mouth daily.    . ferrous sulfate 325 (65 FE) MG tablet Take 325 mg by mouth daily with breakfast.    . metFORMIN (GLUCOPHAGE) 500 MG tablet Take 1 tablet (500 mg total) by mouth 2 (two) times daily with a meal. 180 tablet 1  . pravastatin (PRAVACHOL) 40 MG tablet Take 1 tablet (40 mg total) by mouth daily. 90 tablet 1  . TRUEPLUS LANCETS 30G MISC USE TWICE DAILY 100 each 11  . Azilsartan-Chlorthalidone (EDARBYCLOR) 40-25 MG TABS Take 1 tablet by mouth daily. 126 tablet 0  . potassium chloride (K-DUR) 10 MEQ tablet Take 1 tablet (10 mEq total) by mouth daily. (Patient not taking: Reported on 06/13/2019) 90 tablet 3   No facility-administered medications prior to visit.    ROS Review of Systems  Constitutional: Negative for diaphoresis and fatigue.  HENT: Negative.   Eyes: Negative.   Respiratory: Negative for cough, chest tightness, shortness of breath and wheezing.   Cardiovascular: Negative for chest pain, palpitations and leg swelling.  Gastrointestinal: Negative for abdominal pain, constipation, diarrhea, nausea and vomiting.  Endocrine: Negative.   Genitourinary: Negative.  Negative for difficulty urinating.  Musculoskeletal: Negative for arthralgias  and myalgias.  Skin: Negative for color change.  Neurological: Positive for dizziness and light-headedness. Negative for weakness.  Hematological: Negative for adenopathy. Does not bruise/bleed easily.  Psychiatric/Behavioral: Negative.     Objective:  BP 116/68 (BP Location: Left Arm, Patient Position: Sitting, Cuff Size: Large)   Pulse 96   Temp 97.9 F (36.6 C) (Oral)   Resp 16   Ht 5' 10.5" (1.791 m)   Wt (!) 321 lb 6 oz (145.8 kg)   LMP 08/08/2014   SpO2 95%   BMI 45.46 kg/m   BP Readings from Last 3 Encounters:  06/13/19 116/68  05/10/19 (!) 162/102  09/18/14 113/68    Wt Readings from Last 3 Encounters:  06/13/19 (!) 321 lb 6 oz (145.8 kg)  05/10/19 (!) 330 lb (149.7 kg)  09/16/14 (!) 315 lb 6 oz (143.1 kg)    Physical Exam Vitals reviewed.  Constitutional:      Appearance: She is obese.  HENT:     Nose: Nose normal.     Mouth/Throat:     Mouth: Mucous membranes are moist.  Eyes:     General: No scleral icterus.    Conjunctiva/sclera: Conjunctivae normal.  Cardiovascular:     Rate and Rhythm: Normal rate and regular rhythm.     Heart sounds: No murmur.  Pulmonary:     Effort: Pulmonary effort is normal.     Breath sounds: No stridor. No wheezing, rhonchi or rales.  Abdominal:  General: Abdomen is protuberant. Bowel sounds are normal. There is no distension.     Palpations: There is no hepatomegaly or splenomegaly.     Hernia: No hernia is present.  Musculoskeletal:        General: Normal range of motion.     Cervical back: Neck supple.     Right lower leg: No edema.     Left lower leg: No edema.  Lymphadenopathy:     Cervical: No cervical adenopathy.  Skin:    General: Skin is warm and dry.     Coloration: Skin is not pale.  Neurological:     General: No focal deficit present.     Mental Status: She is alert.  Psychiatric:        Mood and Affect: Mood normal.        Behavior: Behavior normal.     Lab Results  Component Value Date    WBC 5.7 05/10/2019   HGB 14.2 05/10/2019   HCT 44.0 05/10/2019   PLT 215.0 05/10/2019   GLUCOSE 194 (H) 06/13/2019   CHOL 181 07/18/2018   TRIG 124.0 07/18/2018   HDL 40.60 07/18/2018   LDLCALC 116 (H) 07/18/2018   ALT 13 07/18/2018   AST 14 07/18/2018   NA 138 06/13/2019   K 3.6 06/13/2019   CL 100 06/13/2019   CREATININE 0.79 06/13/2019   BUN 15 06/13/2019   CO2 33 (H) 06/13/2019   TSH 4.15 07/18/2018   HGBA1C 6.8 (A) 05/10/2019   MICROALBUR 1.2 07/18/2018    VAS [place] ABI WITH/WO TBI  Result Date: 05/17/2019 LOWER EXTREMITY DOPPLER STUDY Indications: Bilateral leg pain. High Risk Factors: Hypertension, current smoker.  Vascular Interventions: None. Performing Technologist: Alvia Grove RVT  Examination Guidelines: A complete evaluation includes at minimum, Doppler waveform signals and systolic blood pressure reading at the level of bilateral brachial, anterior tibial, and posterior tibial arteries, when vessel segments are accessible. Bilateral testing is considered an integral part of a complete examination. Photoelectric Plethysmograph (PPG) waveforms and toe systolic pressure readings are included as required and additional duplex testing as needed. Limited examinations for reoccurring indications may be performed as noted.  ABI Findings: +---------+------------------+-----+---------+--------+ Right    Rt Pressure (mmHg)IndexWaveform Comment  +---------+------------------+-----+---------+--------+ Brachial 128                                      +---------+------------------+-----+---------+--------+ PTA      150               1.17 triphasic         +---------+------------------+-----+---------+--------+ DP       139               1.09 triphasic         +---------+------------------+-----+---------+--------+ Great Toe120               0.94 Normal            +---------+------------------+-----+---------+--------+  +---------+------------------+-----+---------+-------+ Left     Lt Pressure (mmHg)IndexWaveform Comment +---------+------------------+-----+---------+-------+ Brachial 114                                     +---------+------------------+-----+---------+-------+ PTA      155               1.21 triphasic        +---------+------------------+-----+---------+-------+  DP       152               1.19 triphasic        +---------+------------------+-----+---------+-------+ Great Toe128               1.00 Normal           +---------+------------------+-----+---------+-------+ +-------+-----------+-----------+------------+------------+ ABI/TBIToday's ABIToday's TBIPrevious ABIPrevious TBI +-------+-----------+-----------+------------+------------+ Right  1.17       0.94                                +-------+-----------+-----------+------------+------------+ Left   1.21       1.00                                +-------+-----------+-----------+------------+------------+  Summary: Right: Resting right ankle-brachial index is within normal range. No evidence of significant right lower extremity arterial disease. The right toe-brachial index is normal. Left: Resting left ankle-brachial index is within normal range. No evidence of significant left lower extremity arterial disease. The left toe-brachial index is normal.  *See table(s) above for measurements and observations.  Electronically signed by Ruta Hinds MD on 05/17/2019 at 4:06:38 PM.    Final     Assessment & Plan:   Laketra was seen today for hypertension.  Diagnoses and all orders for this visit:  Essential hypertension, benign- Her blood pressure is over controlled and she is symptomatic.  I recommended she stop taking the ARB/thiazide combination and to control her blood pressure with the single agent indapamide.  She was praised for improving her lifestyle modifications. -     Basic metabolic panel -      indapamide (LOZOL) 1.25 MG tablet; Take 1 tablet (1.25 mg total) by mouth daily.  LVH (left ventricular hypertrophy) due to hypertensive disease, with heart failure ([place])- See above -     Basic metabolic panel -     indapamide (LOZOL) 1.25 MG tablet; Take 1 tablet (1.25 mg total) by mouth daily.   I have discontinued Dionne Bucy. Stadel's Edarbyclor. I am also having her start on indapamide. Additionally, I am having her maintain her TRUEplus Lancets 30G, Alcohol Swabs, ferrous sulfate, potassium chloride, Accu-Chek Guide, buPROPion, aspirin EC, pravastatin, and metFORMIN.  Meds ordered this encounter  Medications  . indapamide (LOZOL) 1.25 MG tablet    Sig: Take 1 tablet (1.25 mg total) by mouth daily.    Dispense:  90 tablet    Refill:  1     Follow-up: Return in about 4 months (around 10/11/2019).  Scarlette Calico, MD

## 2019-06-13 NOTE — Patient Instructions (Signed)

## 2019-09-29 IMAGING — DX DG CHEST 2V
2 series · 2 of 2 positions shown · non-contrast
Comparison: 05/25/2016

CLINICAL DATA: Cough for 2 days

EXAM:
CHEST - 2 VIEW

[chest pa]
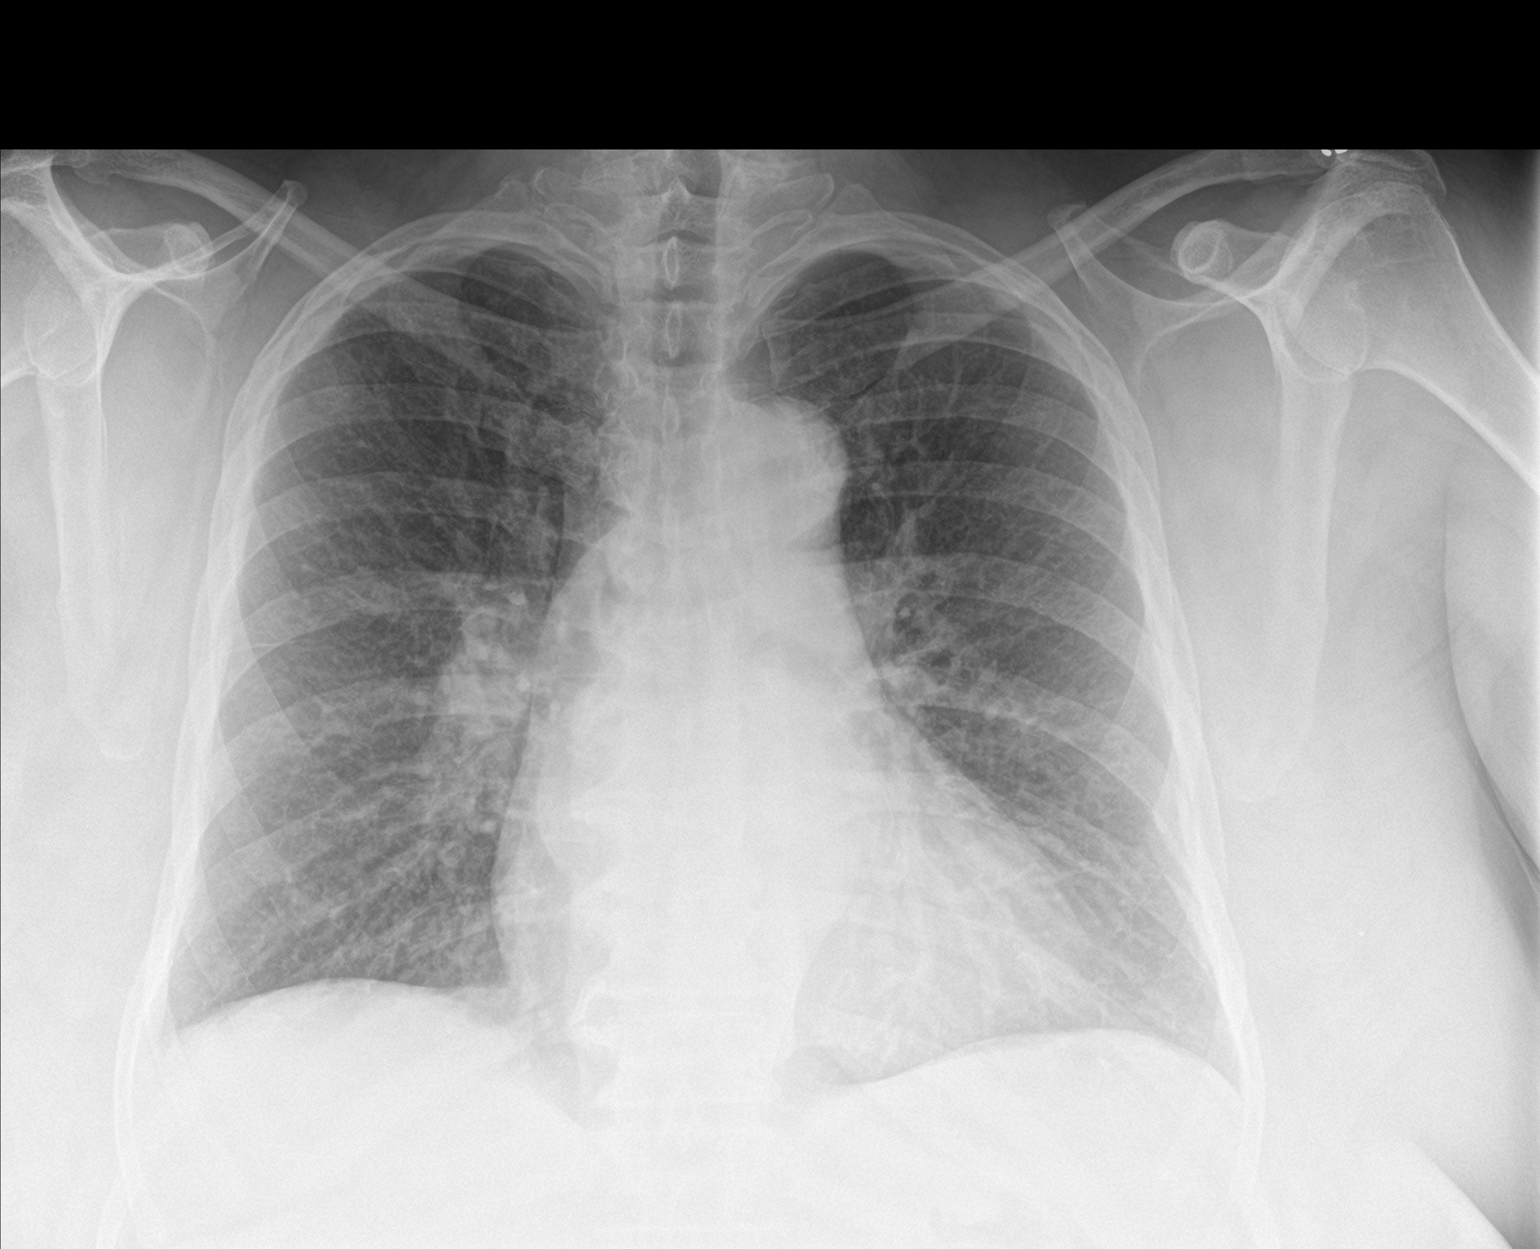

[chest lat]
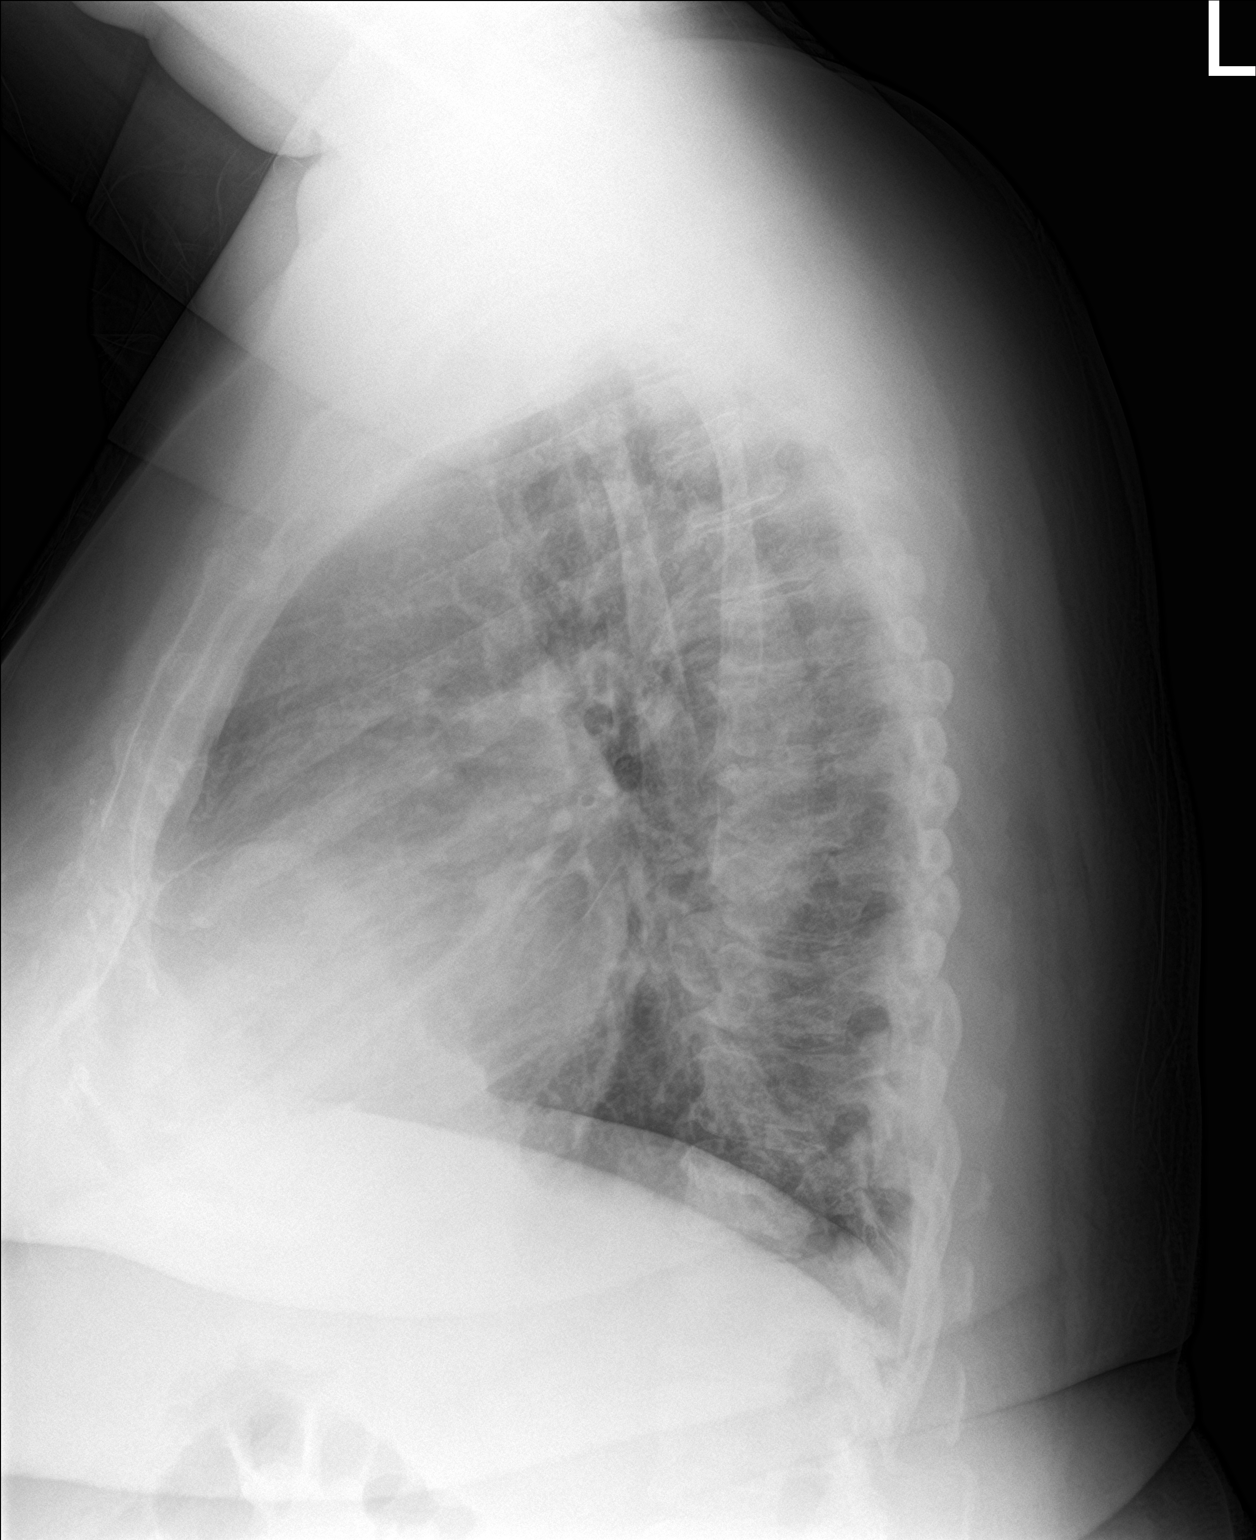

[2 of 2 positions shown; findings below may reference images not displayed]

FINDINGS: Mild cardiomegaly. Diffuse interstitial prominence throughout the
lungs. No confluent opacity or effusion. No acute bony abnormality.
IMPRESSION: Diffuse interstitial prominence, increased since prior study. This
could reflect bronchitis or chronic interstitial disease.

## 2020-01-01 ENCOUNTER — Telehealth: Payer: Self-pay | Admitting: Internal Medicine

## 2020-01-01 ENCOUNTER — Other Ambulatory Visit: Payer: Self-pay | Admitting: Cardiology

## 2020-01-01 ENCOUNTER — Other Ambulatory Visit: Payer: Self-pay | Admitting: Internal Medicine

## 2020-01-01 DIAGNOSIS — I10 Essential (primary) hypertension: Secondary | ICD-10-CM

## 2020-01-01 MED ORDER — POTASSIUM CHLORIDE ER 10 MEQ PO TBCR: 10.0000 meq | EXTENDED_RELEASE_TABLET | Freq: Every day | ORAL | 1 refills | Status: DC

## 2020-01-01 NOTE — Telephone Encounter (Signed)
Pt is requesting an rx for the potassium. Rx was previously rx'ed by cardiology. They have refused to refill the potassium.

## 2020-01-01 NOTE — Telephone Encounter (Signed)
New message:   1.Medication Requested: potassium chloride (K-DUR) 10 MEQ tablet Furosemide 20 mg 2. Pharmacy (Name, Street, [place]): [place], [address] 3. On Med List: Yes/No  4. Last Visit with PCP: 06/13/19  5. Next visit date with PCP: None   Agent: Please be advised that RX refills may take up to 3 business days. We ask that you follow-up with your pharmacy.

## 2020-01-11 ENCOUNTER — Other Ambulatory Visit: Payer: Self-pay | Admitting: Internal Medicine

## 2020-01-11 DIAGNOSIS — I10 Essential (primary) hypertension: Secondary | ICD-10-CM

## 2020-01-11 DIAGNOSIS — I110 Hypertensive heart disease with heart failure: Secondary | ICD-10-CM

## 2020-01-12 ENCOUNTER — Encounter (HOSPITAL_COMMUNITY): Payer: Self-pay

## 2020-01-12 ENCOUNTER — Ambulatory Visit (HOSPITAL_COMMUNITY)
Admission: EM | Admit: 2020-01-12 | Discharge: 2020-01-12 | Disposition: A | Discharge status: Home / Self Care | Payer: Self-pay | Attending: Emergency Medicine | Admitting: Emergency Medicine

## 2020-01-12 ENCOUNTER — Other Ambulatory Visit: Payer: Self-pay

## 2020-01-12 DIAGNOSIS — S20222A Contusion of left back wall of thorax, initial encounter: Secondary | ICD-10-CM

## 2020-01-12 MED ORDER — CYCLOBENZAPRINE HCL 5 MG PO TABS
5.0000 mg | ORAL_TABLET | Freq: Every day | ORAL | 0 refills | Status: DC
Start: 2020-01-12 — End: 2020-02-27

## 2020-01-12 MED ORDER — MELOXICAM 7.5 MG PO TABS: 7.5000 mg | ORAL_TABLET | Freq: Every day | ORAL | 0 refills | Status: DC
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
## 2020-01-12 NOTE — Discharge Instructions (Signed)
Apply ice to the area to help with recovery.  Meloxicam daily. Don't take additional ibuprofen for aleve. Take with food.  Flexeril at night as needed to help as a muscle relaxer and can help with sleep. May cause drowsiness. Please do not take if driving or drinking alcohol.   If symptoms worsen or do not improve in the next week to return to be seen or to follow up with your PCP.
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
## 2020-02-18 ENCOUNTER — Telehealth: Payer: Self-pay | Admitting: Internal Medicine

## 2020-02-27 ENCOUNTER — Telehealth (INDEPENDENT_AMBULATORY_CARE_PROVIDER_SITE_OTHER): Payer: Self-pay | Admitting: Family Medicine

## 2020-02-27 DIAGNOSIS — I110 Hypertensive heart disease with heart failure: Secondary | ICD-10-CM

## 2020-02-27 DIAGNOSIS — E118 Type 2 diabetes mellitus with unspecified complications: Secondary | ICD-10-CM

## 2020-02-27 DIAGNOSIS — E785 Hyperlipidemia, unspecified: Secondary | ICD-10-CM

## 2020-02-27 DIAGNOSIS — Z7689 Persons encountering health services in other specified circumstances: Secondary | ICD-10-CM

## 2020-02-27 DIAGNOSIS — I10 Essential (primary) hypertension: Secondary | ICD-10-CM

## 2020-02-27 MED ORDER — INDAPAMIDE 1.25 MG PO TABS: 1.2500 mg | ORAL_TABLET | Freq: Every day | ORAL | 1 refills | Status: DC

## 2020-02-27 NOTE — Progress Notes (Signed)
Virtual Visit via Telephone Note  I connected with [name] on 02/27/20 at  2:30 PM EDT by telephone and verified that I am speaking with the correct person using two identifiers.  Location: Patient: Home  Provider: Office    I discussed the limitations, risks, security and privacy concerns of performing an evaluation and management service by telephone and the availability of in person appointments. I also discussed with the patient that there may be a patient responsible charge related to this service. The patient expressed understanding and agreed to proceed.   History of Present Illness: Lorraine Holt  54 year old female present in today's call to establish care and needs a medication refill of antihypertensive medication. Patient reports she was last prescribed Lozol 1.25 mg daily for management of hypertension the loss of insurance has been able unable to get medication filled.  She had old blood pressure medication which she was previously taken off of due to excessive lowering of blood pressure, Azilsartan-chlorithalizone and she has taken 2 doses over the last 2 days and this has improved blood pressure.  Over the last week her blood pressure readings have been as high as 170/100.  Patient has a history of left ventricular hypertrophy related to hypertension and type 2 diabetes. She is without chest pain, shortness of breath, or headache.  Due to loss of insurance patient has been unable to afford to see prior PCP.  Observations/Objective: Normal pattern of speech  Appropriate responses to questions and content. Breathing quality appears to be normal.  Assessment and Plan: Encounter to establish care  Essential hypertension - Plan: CBC with Differential, Comprehensive metabolic panel, Lipid Panel, Thyroid Panel With TSH, Refilled Lozol 1.25 mg daily   Type II diabetes mellitus with manifestations (HCC) - Plan: Hemoglobin A1c, Microalbumin/Creatinine Ratio, Urine, Lipid  Panel  Hyperlipidemia with target LDL less than 100 - Plan: Lipid Panel  Essential hypertension, benign - Plan: indapamide (LOZOL) 1.25 MG tablet  LVH (left ventricular hypertrophy) due to hypertensive disease, with heart failure (HCC) - Plan: indapamide (LOZOL) 1.25 MG tablet   Patient will be seen in office given complexity of chronic conditions and inability to appropriately evaluation conditions telephonically, patient will seen in 2 days for  face to face evaluation. Explained visit is more appropriately completed in person.    Follow Up Instructions: 02/29/20 follow-up    I discussed the assessment and treatment plan with the patient. The patient was provided an opportunity to ask questions and all were answered. The patient agreed with the plan and demonstrated an understanding of the instructions.   The patient was advised to call back or seek an in-person evaluation if the symptoms worsen or if the condition fails to improve as anticipated.  I provided 15 minutes of non-face-to-face time during this encounter.    Molli Barrows, FNP

## 2020-02-29 ENCOUNTER — Other Ambulatory Visit: Payer: Self-pay

## 2020-02-29 ENCOUNTER — Other Ambulatory Visit (INDEPENDENT_AMBULATORY_CARE_PROVIDER_SITE_OTHER): Payer: Self-pay

## 2020-02-29 ENCOUNTER — Ambulatory Visit (INDEPENDENT_AMBULATORY_CARE_PROVIDER_SITE_OTHER): Payer: Self-pay | Admitting: Family Medicine

## 2020-02-29 VITALS — BP 126/84 | HR 83 | Temp 98.2°F | Resp 17 | Wt 332.0 lb

## 2020-02-29 DIAGNOSIS — Z23 Encounter for immunization: Secondary | ICD-10-CM

## 2020-02-29 DIAGNOSIS — I110 Hypertensive heart disease with heart failure: Secondary | ICD-10-CM

## 2020-02-29 DIAGNOSIS — I499 Cardiac arrhythmia, unspecified: Secondary | ICD-10-CM

## 2020-02-29 DIAGNOSIS — I10 Essential (primary) hypertension: Secondary | ICD-10-CM

## 2020-02-29 DIAGNOSIS — E785 Hyperlipidemia, unspecified: Secondary | ICD-10-CM

## 2020-02-29 DIAGNOSIS — E118 Type 2 diabetes mellitus with unspecified complications: Secondary | ICD-10-CM

## 2020-02-29 DIAGNOSIS — D509 Iron deficiency anemia, unspecified: Secondary | ICD-10-CM

## 2020-02-29 LAB — POCT URINALYSIS DIP (CLINITEK)
Bilirubin, UA: NEGATIVE
Blood, UA: NEGATIVE
Glucose, UA: NEGATIVE mg/dL
Ketones, POC UA: NEGATIVE mg/dL
Leukocytes, UA: NEGATIVE
Nitrite, UA: NEGATIVE
POC PROTEIN,UA: NEGATIVE
Spec Grav, UA: 1.02 (ref 1.010–1.025)
Urobilinogen, UA: 0.2 E.U./dL
pH, UA: 5 (ref 5.0–8.0)

## 2020-02-29 LAB — GLUCOSE, POCT (MANUAL RESULT ENTRY): POC Glucose: 150 mg/dl — AB (ref 70–99)

## 2020-02-29 LAB — POCT GLYCOSYLATED HEMOGLOBIN (HGB A1C): Hemoglobin A1C: 7.9 % — AB (ref 4.0–5.6)

## 2020-02-29 MED ORDER — CARVEDILOL 3.125 MG PO TABS: 3.1250 mg | ORAL_TABLET | Freq: Two times a day (BID) | ORAL | 1 refills | Status: DC

## 2020-02-29 MED ORDER — ASPIRIN EC 81 MG PO TBEC: 81.0000 mg | DELAYED_RELEASE_TABLET | Freq: Every day | ORAL | 1 refills | Status: DC

## 2020-02-29 MED ORDER — POTASSIUM CHLORIDE ER 10 MEQ PO TBCR: 10.0000 meq | EXTENDED_RELEASE_TABLET | Freq: Every day | ORAL | 1 refills | Status: DC

## 2020-02-29 MED ORDER — PRAVASTATIN SODIUM 40 MG PO TABS: 40.0000 mg | ORAL_TABLET | Freq: Every day | ORAL | 1 refills | Status: DC

## 2020-02-29 MED ORDER — METFORMIN HCL 500 MG PO TABS: ORAL_TABLET | ORAL | 1 refills | Status: DC

## 2020-02-29 MED ORDER — INDAPAMIDE 1.25 MG PO TABS: 1.2500 mg | ORAL_TABLET | Freq: Every day | ORAL | 1 refills | Status: DC

## 2020-02-29 NOTE — Progress Notes (Signed)
SR, 80 BPM, occasional PAC , left atrial enlargement. No acute ST changes , Personally reviewed and interpreted by me.  Molli Barrows, NP-C

## 2020-02-29 NOTE — Progress Notes (Deleted)
Established Patient Office Visit  Subjective:  Patient ID: Lorraine Holt, female    DOB: 30-Nov-1965  Age: 54 y.o. MRN: [id number]  CC:  Chief Complaint  Patient presents with  . Diabetes  . Hypertension    HPI Lorraine Holt presents for hypertension and diabetes follow-up  Past Medical History:  Diagnosis Date  . Anemia    history age 46'S  . Arthritis    Phreesia 02/24/2020  . Bronchitis   . Carpal tunnel syndrome of right wrist   . Diabetes mellitus   . Diabetes mellitus without complication ([place])    Phreesia 02/24/2020  . Hyperlipidemia   . Hypertension   . Obesity   . Peripheral vascular disease (HCC)    undiagnosis, but complains of numbness in legs  . Shortness of breath dyspnea    with exertion  . Sleep apnea    [employer]reesia 02/24/2020  . Tooth abscess 09/30/14   tooth was extracted    Past Surgical History:  Procedure Laterality Date  . ABDOMINAL HYSTERECTOMY  09/2015  . BILATERAL SALPINGECTOMY Right 09/17/2014   Procedure: RIGHT SALPINGECTOMY;  Surgeon: Lavonia Drafts, MD;  Location: [facility];  Service: Gynecology;  Laterality: Right;  . BREAST BIOPSY Left   . COLONOSCOPY  04/2014  . DILATION AND CURETTAGE OF UTERUS    . ROBOTIC ASSISTED TOTAL HYSTERECTOMY N/A 09/17/2014   Procedure: ROBOTIC ASSISTED TOTAL HYSTERECTOMY;  Surgeon: Lavonia Drafts, MD;  Location: [facility];  Service: Gynecology;  Laterality: N/A;  . TOOTH EXTRACTION  09/30/14  . TUBAL LIGATION      Family History  Problem Relation Age of Onset  . Diabetes Maternal Aunt   . Breast cancer Maternal Aunt   . Hypertension Mother   . Diabetes Mother   . Breast cancer Mother   . Diabetes Maternal Uncle   . Breast cancer Maternal Aunt   . COPD Neg Hx   . Early death Neg Hx   . Heart disease Neg Hx   . Hyperlipidemia Neg Hx   . Kidney disease Neg Hx   . Stroke Neg Hx     Social History   Socioeconomic History  . Marital status: Married    Spouse name: Not on file  . Number  of children: Not on file  . Years of education: Not on file  . Highest education level: Not on file  Occupational History  . Occupation: [employer]  Tobacco Use  . Smoking status: Current Every Day Smoker    Packs/day: 0.50    Years: 20.00    Pack years: 10.00    Types: Cigarettes  . Smokeless tobacco: Never Used  Substance and Sexual Activity  . Alcohol use: No  . Drug use: No  . Sexual activity: Never    Birth control/protection: Surgical  Other Topics Concern  . Not on file  Social History Narrative  . Not on file   Social Determinants of Health   Financial Resource Strain:   . Difficulty of Paying Living Expenses: Not on file  Food Insecurity:   . Worried About Charity fundraiser in the Last Year: Not on file  . Ran Out of Food in the Last Year: Not on file  Transportation Needs:   . Lack of Transportation (Medical): Not on file  . Lack of Transportation (Non-Medical): Not on file  Physical Activity:   . Days of Exercise per Week: Not on file  . Minutes of Exercise per Session: Not on file  Stress:   . Feeling of Stress : Not on file  Social Connections:   . Frequency of Communication with Friends and Family: Not on file  . Frequency of Social Gatherings with Friends and Family: Not on file  . Attends Religious Services: Not on file  . Active Member of Clubs or Organizations: Not on file  . Attends Archivist Meetings: Not on file  . Marital Status: Not on file  Intimate Partner Violence:   . Fear of Current or Ex-Partner: Not on file  . Emotionally Abused: Not on file  . Physically Abused: Not on file  . Sexually Abused: Not on file    Outpatient Medications Prior to Visit  Medication Sig Dispense Refill  . ACCU-CHEK GUIDE test strip USE TO TEST UP TO TWICE DAILY 100 each 11  . aspirin EC 81 MG tablet Take 1 tablet (81 mg total) by mouth daily. 90 tablet 1  . indapamide (LOZOL) 1.25 MG tablet Take 1 tablet (1.25 mg total) by mouth  daily. 90 tablet 1  . metFORMIN (GLUCOPHAGE) 500 MG tablet Take 1 tablet (500 mg total) by mouth 2 (two) times daily with a meal. 180 tablet 1  . potassium chloride (KLOR-CON) 10 MEQ tablet Take 1 tablet (10 mEq total) by mouth daily. 90 tablet 1  . pravastatin (PRAVACHOL) 40 MG tablet Take 1 tablet (40 mg total) by mouth daily. 90 tablet 1  . TRUEPLUS LANCETS 30G MISC USE TWICE DAILY 100 each 11   No facility-administered medications prior to visit.    Allergies  Allergen Reactions  . Clindamycin/Lincomycin Swelling    Tongue swelled  . Penicillins Swelling and Rash  . Amlodipine     Constipation, dizziness    ROS Review of Systems    Objective:    Physical Exam BP 126/84   Pulse 83   Temp 98.2 F (36.8 C) (Temporal)   Resp 17   Wt (!) 332 lb (150.6 kg)   LMP 08/08/2014   SpO2 96%   BMI 46.96 kg/m  Wt Readings from Last 3 Encounters:  02/29/20 (!) 332 lb (150.6 kg)  06/13/19 (!) 321 lb 6 oz (145.8 kg)  05/10/19 (!) 330 lb (149.7 kg)     Health Maintenance Due  Topic Date Due  . COVID-19 Vaccine (1) Never done  . OPHTHALMOLOGY EXAM  12/28/2018  . MAMMOGRAM  01/26/2019  . URINE MICROALBUMIN  07/19/2019  . INFLUENZA VACCINE  01/06/2020    There are no preventive care reminders to display for this patient.  Lab Results  Component Value Date   TSH 4.15 07/18/2018   Lab Results  Component Value Date   WBC 5.7 05/10/2019   HGB 14.2 05/10/2019   HCT 44.0 05/10/2019   MCV 88.4 05/10/2019   PLT 215.0 05/10/2019   Lab Results  Component Value Date   NA 138 06/13/2019   K 3.6 06/13/2019   CO2 33 (H) 06/13/2019   GLUCOSE 194 (H) 06/13/2019   BUN 15 06/13/2019   CREATININE 0.79 06/13/2019   BILITOT 0.6 07/18/2018   ALKPHOS 151 (H) 07/18/2018   AST 14 07/18/2018   ALT 13 07/18/2018   PROT 7.8 07/18/2018   ALBUMIN 4.0 07/18/2018   CALCIUM 9.7 06/13/2019   ANIONGAP 5 09/18/2014   GFR 91.12 06/13/2019   Lab Results  Component Value Date   CHOL 181  07/18/2018   Lab Results  Component Value Date   HDL 40.60 07/18/2018   Lab Results  Component Value  Date   LDLCALC 116 (H) 07/18/2018   Lab Results  Component Value Date   TRIG 124.0 07/18/2018   Lab Results  Component Value Date   CHOLHDL 4 07/18/2018   Lab Results  Component Value Date   HGBA1C 7.9 (A) 02/29/2020      Assessment & Plan:   Problem List Items Addressed This Visit      Endocrine   Type II diabetes mellitus with manifestations ([place]) - Primary   Relevant Orders   POCT URINALYSIS DIP (CLINITEK) (Completed)   Glucose (CBG) (Completed)   HgB A1c (Completed)     Other   Hyperlipidemia with target LDL less than 100   Iron deficiency anemia   Relevant Orders   Iron, TIBC and Ferritin Panel    Other Visit Diagnoses    Essential hypertension       Relevant Orders   POCT URINALYSIS DIP (CLINITEK) (Completed)   Needs flu shot       Relevant Orders   Flu Vaccine QUAD 6+ mos PF IM (Fluarix Quad PF)      No orders of the defined types were placed in this encounter.   Follow-up: No follow-ups on file.    Molli Barrows, FNP

## 2020-02-29 NOTE — Patient Instructions (Addendum)
Changes discussed today regarding her medications.. Adding carvedilol 3.125 twice daily take 1 in the morning and 1 at bedtime.  This is okay to take along with your Lozol. Increasing Metformin 500 mg in the morning 1000 mg equivalent to 2 tablets in the evening with dinner. Continue pravastatin as prescribed.  Furosemide 20 mg once daily as needed for fluid retention.  Continue to check blood pressure readings daily and notify us of any readings consistently greater than 150/100.  Also once your insurance is in effect please notify our office will get you back in with cardiology because it is approaching the time for you to have a repeat echocardiogram. We will see you back in 3 months and recheck your A1c.    Hypertension, Adult Hypertension is another name for high blood pressure. High blood pressure forces your heart to work harder to pump blood. This can cause problems over time. There are two numbers in a blood pressure reading. There is a top number (systolic) over a bottom number (diastolic). It is best to have a blood pressure that is below 120/80. Healthy choices can help lower your blood pressure, or you may need medicine to help lower it. What are the causes? The cause of this condition is not known. Some conditions may be related to high blood pressure. What increases the risk?  Smoking.  Having type 2 diabetes mellitus, high cholesterol, or both.  Not getting enough exercise or physical activity.  Being overweight.  Having too much fat, sugar, calories, or salt (sodium) in your diet.  Drinking too much alcohol.  Having long-term (chronic) kidney disease.  Having a family history of high blood pressure.  Age. Risk increases with age.  Race. You may be at higher risk if you are African American.  Gender. Men are at higher risk than women before age 1. After age 74, women are at higher risk than men.  Having obstructive sleep apnea.  Stress. What are the signs or  symptoms?  High blood pressure may not cause symptoms. Very high blood pressure (hypertensive crisis) may cause: ? Headache. ? Feelings of worry or nervousness (anxiety). ? Shortness of breath. ? Nosebleed. ? A feeling of being sick to your stomach (nausea). ? Throwing up (vomiting). ? Changes in how you see. ? Very bad chest pain. ? Seizures. How is this treated?  This condition is treated by making healthy lifestyle changes, such as: ? Eating healthy foods. ? Exercising more. ? Drinking less alcohol.  Your health care provider may prescribe medicine if lifestyle changes are not enough to get your blood pressure under control, and if: ? Your top number is above 130. ? Your bottom number is above 80.  Your personal target blood pressure may vary. Follow these instructions at home: Eating and drinking   If told, follow the DASH eating plan. To follow this plan: ? Fill one half of your plate at each meal with fruits and vegetables. ? Fill one fourth of your plate at each meal with whole grains. Whole grains include whole-wheat pasta, brown rice, and whole-grain bread. ? Eat or drink low-fat dairy products, such as skim milk or low-fat yogurt. ? Fill one fourth of your plate at each meal with low-fat (lean) proteins. Low-fat proteins include fish, chicken without skin, eggs, beans, and tofu. ? Avoid fatty meat, cured and processed meat, or chicken with skin. ? Avoid pre-made or processed food.  Eat less than 1,500 mg of salt each day.  Do not drink  alcohol if: ? Your doctor tells you not to drink. ? You are pregnant, may be pregnant, or are planning to become pregnant.  If you drink alcohol: ? Limit how much you use to:  0-1 drink a day for women.  0-2 drinks a day for men. ? Be aware of how much alcohol is in your drink. In the [place] one drink equals one 12 oz bottle of beer (355 mL), one 5 oz glass of wine (148 mL), or one 1 oz glass of hard liquor (44  mL). Lifestyle   Work with your doctor to stay at a healthy weight or to lose weight. Ask your doctor what the best weight is for you.  Get at least 30 minutes of exercise most days of the week. This may include walking, swimming, or biking.  Get at least 30 minutes of exercise that strengthens your muscles (resistance exercise) at least 3 days a week. This may include lifting weights or doing Pilates.  Do not use any products that contain nicotine or tobacco, such as cigarettes, e-cigarettes, and chewing tobacco. If you need help quitting, ask your doctor.  Check your blood pressure at home as told by your doctor.  Keep all follow-up visits as told by your doctor. This is important. Medicines  Take over-the-counter and prescription medicines only as told by your doctor. Follow directions carefully.  Do not skip doses of blood pressure medicine. The medicine does not work as well if you skip doses. Skipping doses also puts you at risk for problems.  Ask your doctor about side effects or reactions to medicines that you should watch for. Contact a doctor if you:  Think you are having a reaction to the medicine you are taking.  Have headaches that keep coming back (recurring).  Feel dizzy.  Have swelling in your ankles.  Have trouble with your vision. Get help right away if you:  Get a very bad headache.  Start to feel mixed up (confused).  Feel weak or numb.  Feel faint.  Have very bad pain in your: ? Chest. ? Belly (abdomen).  Throw up more than once.  Have trouble breathing. Summary  Hypertension is another name for high blood pressure.  High blood pressure forces your heart to work harder to pump blood.  For most people, a normal blood pressure is less than 120/80.  Making healthy choices can help lower blood pressure. If your blood pressure does not get lower with healthy choices, you may need to take medicine. This information is not intended to replace  advice given to you by your health care provider. Make sure you discuss any questions you have with your health care provider. Document Revised: 02/01/2018 Document Reviewed: 02/01/2018 Elsevier Patient Education  [place].    Type 2 Diabetes Mellitus, Diagnosis, Adult Type 2 diabetes (type 2 diabetes mellitus) is a long-term (chronic) disease. It may be caused by one or both of these problems:  Your pancreas does not make enough of a hormone called insulin.  Your body does not react in a normal way to insulin that it makes. Insulin lets sugars (glucose) go into cells in your body. This gives you energy. If you have type 2 diabetes, sugars cannot get into cells. This causes high blood sugar (hyperglycemia). Your doctor will set treatment goals for you. Generally, you should have these blood sugar levels:  Before meals (preprandial): 80-130 mg/dL (4.4-7.2 mmol/L).  After meals (postprandial): below 180 mg/dL (10 mmol/L).  A1c (hemoglobin  A1c) level: less than 7%. Follow these instructions at home: Questions to ask your doctor  You may want to ask these questions: ? Do I need to meet with a diabetes educator? ? Where can I find a support group for people with diabetes? ? What equipment will I need to care for myself at home? ? What diabetes medicines do I need? When should I take them? ? How often do I need to check my blood sugar? ? What number can I call if I have questions? ? When is my next doctor's visit? General instructions  Take over-the-counter and prescription medicines only as told by your doctor.  Keep all follow-up visits as told by your doctor. This is important. Contact a doctor if:  Your blood sugar is at or above 240 mg/dL (13.3 mmol/L) for 2 days in a row.  You have been sick for 2 days or more, and you are not getting better.  You have had a fever for 2 days or more, and you are not getting better.  You have any of these problems for more than 6  hours: ? You cannot eat or drink. ? You feel sick to your stomach (nauseous). ? You throw up (vomit). ? You have watery poop (diarrhea). Get help right away if:  Your blood sugar is lower than 54 mg/dL (3 mmol/L).  You get confused.  You have trouble: ? Thinking clearly. ? Breathing.  You have moderate or large ketone levels in your pee (urine). Summary  Type 2 diabetes is a long-term (chronic) disease. Your pancreas may not make enough of a hormone called insulin, or your body may not react normally to insulin that it makes.  Take over-the-counter and prescription medicines only as told by your doctor.  Keep all follow-up visits as told by your doctor. This is important. This information is not intended to replace advice given to you by your health care provider. Make sure you discuss any questions you have with your health care provider. Document Revised: 07/22/2017 Document Reviewed: 06/27/2015 Elsevier Patient Education  2020 [employer].

## 2020-02-29 NOTE — Progress Notes (Signed)
Patient ID: Lorraine Holt, female    DOB: 03-27-1966, 54 y.o.   MRN: [id number]  PCP: Nicolette Bang, DO  Chief Complaint  Patient presents with   Diabetes   Hypertension    Subjective:  HPI Lorraine Holt is a 54 y.o. female presents for evaluation hypertension, diabetes, cholesterol labs, and medication refills.  Prior note from recent telemedicine visit 02/27/20 Lorraine Holt  54 year old female present in today's call to establish care and needs a medication refill of antihypertensive medication. Patient reports she was last prescribed Lozol 1.25 mg daily for management of hypertension the loss of insurance has been able unable to get medication filled.  She had old blood pressure medication which she was previously taken off of due to excessive lowering of blood pressure, Azilsartan-chlorithalizone and she has taken 2 doses over the last 2 days and this has improved blood pressure.  Over the last week her blood pressure readings have been as high as 170/100.  Patient has a history of left ventricular hypertrophy related to hypertension and type 2 diabetes. She is without chest pain, shortness of breath, or headache.  Due to loss of insurance patient has been unable to afford to see prior PCP.    Today's visit 02/29/20 Hypertension BP stable today. Patient has resumed taking Lozol and had been taking Furosamide also which is not listed on her active medication list. In review of EMR, patient was prescribed furosemide by cardiologist, however, patient prior PCP, Dr. Ronnald Ramp discontinued the furosemide after initiating hypertensive treatment with indapamide. She has been taking Furosamide on daily basis. Last renal function was normal. occassionally notices lower extremity edema, however, when taking BP medication and managing sodium, leg swelling improves. 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 Also previously  prescribed by cardiology beta-blocker for improved workload of the heart, this was also discontinued or not refilled at some point. No recent CP, SOB, or new weakness. BP well controlled today.  Health Maintenance and [place] Lost insurance and will have full insurance in November. Had not seen cardiologist since 2019. Would like to reestablish once insurance is effective. She also intends to have her colon cancer screening completed at that time as well.  Diabetes Last A1C 6.8 05/1999 No routine home monitoring of blood sugar. Metformin less than moderate dose of 1000 mg daily. No routine physical exercise, current Body mass index is 46.96 kg/m.  Currently on statin therapy.  Iron anemia (hx) Take supplements daily. Iron level has not been rechecked in years, will check today.  Social History   Socioeconomic History   Marital status: Married    Spouse name: Not on file   Number of children: Not on file   Years of education: Not on file   Highest education level: Not on file  Occupational History   Occupation: [employer]  Tobacco Use   Smoking status: Current Every Day Smoker    Packs/day: 0.50    Years: 20.00    Pack years: 10.00    Types: Cigarettes   Smokeless tobacco: Never Used  Substance and Sexual Activity   Alcohol use: No   Drug use: No   Sexual activity: Never    Birth control/protection: Surgical  Other Topics Concern   Not on file  Social History Narrative   Not on file   Social Determinants of Health   Financial Resource Strain:  Difficulty of Paying Living Expenses: Not on file  Food Insecurity:    Worried About [place] in the Last Year: Not on file   Ran Out of Food in the Last Year: Not on file  Transportation Needs:    Lack of Transportation (Medical): Not on file   Lack of Transportation (Non-Medical): Not on file  Physical Activity:    Days of Exercise per Week: Not on file   Minutes of Exercise  per Session: Not on file  Stress:    Feeling of Stress : Not on file  Social Connections:    Frequency of Communication with Friends and Family: Not on file   Frequency of Social Gatherings with Friends and Family: Not on file   Attends Religious Services: Not on file   Active Member of Clubs or Organizations: Not on file   Attends Archivist Meetings: Not on file   Marital Status: Not on file  Intimate Partner Violence:    Fear of Current or Ex-Partner: Not on file   Emotionally Abused: Not on file   Physically Abused: Not on file   Sexually Abused: Not on file    Family History  Problem Relation Age of Onset   Diabetes Maternal Aunt    Breast cancer Maternal Aunt    Hypertension Mother    Diabetes Mother    Breast cancer Mother    Diabetes Maternal Uncle    Breast cancer Maternal Aunt    COPD Neg Hx    Early death Neg Hx    Heart disease Neg Hx    Hyperlipidemia Neg Hx    Kidney disease Neg Hx    Stroke Neg Hx    Review of Systems Pertinent negatives listed in HPI  Allergies  Allergen Reactions   Clindamycin/Lincomycin Swelling    Tongue swelled   Penicillins Swelling and Rash   Amlodipine     Constipation, dizziness    Prior to Admission medications   Medication Sig Start Date End Date Taking? Authorizing Provider  ACCU-CHEK GUIDE test strip USE TO TEST UP TO TWICE DAILY 04/24/18   Janith Lima, MD  aspirin EC 81 MG tablet Take 1 tablet (81 mg total) by mouth daily. 02/29/20   Scot Jun, FNP  carvedilol (COREG) 3.125 MG tablet Take 1 tablet (3.125 mg total) by mouth 2 (two) times daily with a meal. 02/29/20 05/29/20  Scot Jun, FNP  indapamide (LOZOL) 1.25 MG tablet Take 1 tablet (1.25 mg total) by mouth daily. 02/29/20   Scot Jun, FNP  metFORMIN (GLUCOPHAGE) 500 MG tablet Take 1 tablet with breakfast and take 2 tablets with dinner daily. 02/29/20   Scot Jun, FNP  potassium chloride  (KLOR-CON) 10 MEQ tablet Take 1 tablet (10 mEq total) by mouth daily. 02/29/20   Scot Jun, FNP  pravastatin (PRAVACHOL) 40 MG tablet Take 1 tablet (40 mg total) by mouth daily. 02/29/20   Scot Jun, FNP  TRUEPLUS LANCETS 30G MISC USE TWICE DAILY 04/18/17   Janith Lima, MD    Past Medical, Surgical Family and Social History reviewed and updated.    Objective:   Today's Vitals   02/29/20 1016  BP: 126/84  Pulse: 83  Resp: 17  Temp: 98.2 F (36.8 C)  TempSrc: Temporal  SpO2: 96%  Weight: (!) 332 lb (150.6 kg)    BP Readings from Last 3 Encounters:  02/29/20 126/84  01/12/20 (!) 149/95  06/13/19 116/68  Filed Weights   02/29/20 1016  Weight: (!) 332 lb (150.6 kg)       Physical Exam    Lab Results  Component Value Date   POCGLU 150 (A) 02/29/2020    Lab Results  Component Value Date   HGBA1C 7.9 (A) 02/29/2020            Assessment & Plan:  1. Type II diabetes mellitus with manifestations (HCC) A1C increased 7.5.  Aim for 30 minutes of exercise most days, with a goal of 150 minutes per week. -Glucose monitoring at minimal of twice daily and keep a log of readings. -Commit to medication adherence and self-adjustment as needed -increase foods containing whole grains (one-half of grain intake). -saturated fat intake should be reduced -reduce intake of trans fat (lowers LDL cholesterol and increases HDL cholesterol) -Eat 4-5 small meals during the day to reduce the risk of becoming hungry. -Increased metformin 500 mg breakfast and increased 1000 mg at dinner. -Recheck A1c in 3 months if no improvement and insurance, consider adding Jardence if not at goal  2. Hyperlipidemia with target LDL less than 100 *- pravastatin (PRAVACHOL) 40 MG tablet; Take 1 tablet (40 mg total) by mouth daily.  Dispense: 90 tablet; Refill: 1  3. Needs flu shot - Flu Vaccine QUAD 6+ mos PF IM (Fluarix Quad PF)  4. Iron deficiency anemia, unspecified iron  deficiency anemia type, hold  - Iron, TIBC and Ferritin Panel  5. Irregular heart beat -BB added - EKG 12-Lead, occasional PAC with left atrial enlargement. (patient will need to follow-up with cardiologist when insurance is effective)   7. Essential hypertension, benign, controlled. - indapamide (LOZOL) 1.25 MG tablet; Take 1 tablet (1.25 mg total) by mouth daily.  Dispense: 90 tablet; Refill: 1  - potassium chloride (KLOR-CON) 10 MEQ tablet; Take 1 tablet (10 mEq total) by mouth daily.  Dispense: 90 tablet; Refill: 1  8. LVH (left ventricular hypertrophy) due to hypertensive disease, with heart failure (HCC) BP stable today, need to re-establish with cariology for surveillance once insurance is effective, for now continue:  - indapamide (LOZOL) 1.25 MG tablet; Take 1 tablet (1.25 mg total) by mouth daily.  Dispense: 90 tablet; Refill: 1  -Adding Coreg to improve workload and contractility of the heart muscle. Low dose for now, cardiology may titrate if their evaluation warrants. BP stable today, therefore will initiate with the lowest dose.  -Discussed furosemide, advised due to increase renal burden of dual diuretics and risk for electrolyte dysfunction, recommend use of furosemide as needed if BLE edema. Likely will not need as long as sodium intake is controlled.  - carvedilol (COREG) 3.125 MG tablet; Take 1 tablet (3.125 mg total) by mouth 2 (two) times daily with a meal.  Dispense: 180 tablet; Refill: [place], FNP Primary Care at [facility] [address] [place] [address] [phone] fax: [phone]

## 2020-03-01 LAB — CBC WITH DIFFERENTIAL/PLATELET
Basophils Absolute: 0 10*3/uL (ref 0.0–0.2)
Basos: 1 %
EOS (ABSOLUTE): 0.2 10*3/uL (ref 0.0–0.4)
Eos: 4 %
Hematocrit: 44.4 % (ref 34.0–46.6)
Hemoglobin: 14.8 g/dL (ref 11.1–15.9)
Immature Grans (Abs): 0 10*3/uL (ref 0.0–0.1)
Immature Granulocytes: 0 %
Lymphocytes Absolute: 2 10*3/uL (ref 0.7–3.1)
Lymphs: 36 %
MCH: 29 pg (ref 26.6–33.0)
MCHC: 33.3 g/dL (ref 31.5–35.7)
MCV: 87 fL (ref 79–97)
Monocytes Absolute: 0.4 10*3/uL (ref 0.1–0.9)
Monocytes: 8 %
Neutrophils Absolute: 2.7 10*3/uL (ref 1.4–7.0)
Neutrophils: 51 %
Platelets: 251 10*3/uL (ref 150–450)
RBC: 5.11 x10E6/uL (ref 3.77–5.28)
RDW: 13.2 % (ref 11.7–15.4)
WBC: 5.4 10*3/uL (ref 3.4–10.8)

## 2020-03-01 LAB — COMPREHENSIVE METABOLIC PANEL

## 2020-03-01 LAB — MICROALBUMIN / CREATININE URINE RATIO
Creatinine, Urine: 56.4 mg/dL
Microalb/Creat Ratio: 12 mg/g creat (ref 0–29)
Microalbumin, Urine: 6.7 ug/mL

## 2020-03-01 LAB — LIPID PANEL

## 2020-03-01 LAB — HEMOGLOBIN A1C
Est. average glucose Bld gHb Est-mCnc: 171 mg/dL
Hgb A1c MFr Bld: 7.6 % — ABNORMAL HIGH (ref 4.8–5.6)

## 2020-03-01 LAB — IRON,TIBC AND FERRITIN PANEL
Ferritin: 135 ng/mL (ref 15–150)
Iron Saturation: 17 % (ref 15–55)
Iron: 52 ug/dL (ref 27–159)
Total Iron Binding Capacity: 314 ug/dL (ref 250–450)
UIBC: 262 ug/dL (ref 131–425)

## 2020-03-01 LAB — THYROID PANEL WITH TSH

## 2020-03-02 LAB — COMPREHENSIVE METABOLIC PANEL
ALT: 15 IU/L (ref 0–32)
AST: 14 IU/L (ref 0–40)
Albumin/Globulin Ratio: 1.3 (ref 1.2–2.2)
Albumin: 4.1 g/dL (ref 3.8–4.9)
Alkaline Phosphatase: 159 IU/L — ABNORMAL HIGH (ref 44–121)
BUN/Creatinine Ratio: 18 (ref 9–23)
BUN: 14 mg/dL (ref 6–24)
Bilirubin Total: 0.2 mg/dL (ref 0.0–1.2)
CO2: 22 mmol/L (ref 20–29)
Calcium: 9.6 mg/dL (ref 8.7–10.2)
Chloride: 99 mmol/L (ref 96–106)
Creatinine, Ser: 0.79 mg/dL (ref 0.57–1.00)
GFR calc Af Amer: 97 mL/min/{1.73_m2} (ref >59)
GFR calc non Af Amer: 84 mL/min/{1.73_m2} (ref >59)
Globulin, Total: 3.1 g/dL (ref 1.5–4.5)
Glucose: 149 mg/dL — ABNORMAL HIGH (ref 65–99)
Potassium: 4.1 mmol/L (ref 3.5–5.2)
Sodium: 141 mmol/L (ref 134–144)
Total Protein: 7.2 g/dL (ref 6.0–8.5)

## 2020-03-02 LAB — THYROID PANEL WITH TSH
Free Thyroxine Index: 1.6 (ref 1.2–4.9)
T3 Uptake Ratio: 25 % (ref 24–39)
T4, Total: 6.3 ug/dL (ref 4.5–12.0)
TSH: 3.13 u[IU]/mL (ref 0.450–4.500)

## 2020-03-02 LAB — LIPID PANEL
Chol/HDL Ratio: 3.7 ratio (ref 0.0–4.4)
Cholesterol, Total: 177 mg/dL (ref 100–199)
HDL: 48 mg/dL (ref >39)
LDL Chol Calc (NIH): 112 mg/dL — ABNORMAL HIGH (ref 0–99)
Triglycerides: 93 mg/dL (ref 0–149)
VLDL Cholesterol Cal: 17 mg/dL (ref 5–40)

## 2020-03-02 LAB — SPECIMEN STATUS REPORT

## 2020-03-12 ENCOUNTER — Ambulatory Visit: Payer: Self-pay

## 2020-03-17 ENCOUNTER — Ambulatory Visit: Payer: Self-pay | Attending: Internal Medicine

## 2020-03-17 ENCOUNTER — Other Ambulatory Visit: Payer: Self-pay

## 2020-03-21 ENCOUNTER — Ambulatory Visit: Payer: Self-pay

## 2020-04-02 ENCOUNTER — Other Ambulatory Visit: Payer: Self-pay

## 2020-04-02 DIAGNOSIS — Z1231 Encounter for screening mammogram for malignant neoplasm of breast: Secondary | ICD-10-CM

## 2020-04-02 NOTE — Progress Notes (Signed)
Called pt lvm about free event in Nov. If pt calls back tell her to call [phone] for Wed nov 17

## 2020-05-22 ENCOUNTER — Ambulatory Visit (INDEPENDENT_AMBULATORY_CARE_PROVIDER_SITE_OTHER): Payer: No Typology Code available for payment source | Admitting: Internal Medicine

## 2020-05-22 ENCOUNTER — Other Ambulatory Visit: Payer: Self-pay

## 2020-05-22 ENCOUNTER — Other Ambulatory Visit: Payer: No Typology Code available for payment source

## 2020-05-22 ENCOUNTER — Encounter: Payer: Self-pay | Admitting: Internal Medicine

## 2020-05-22 VITALS — BP 110/77 | HR 88 | Ht 70.5 in | Wt 331.4 lb

## 2020-05-22 DIAGNOSIS — M25561 Pain in right knee: Secondary | ICD-10-CM

## 2020-05-22 DIAGNOSIS — G8929 Other chronic pain: Secondary | ICD-10-CM

## 2020-05-22 DIAGNOSIS — M25562 Pain in left knee: Secondary | ICD-10-CM

## 2020-05-22 DIAGNOSIS — I10 Essential (primary) hypertension: Secondary | ICD-10-CM | POA: Diagnosis not present

## 2020-05-22 DIAGNOSIS — E785 Hyperlipidemia, unspecified: Secondary | ICD-10-CM

## 2020-05-22 DIAGNOSIS — E118 Type 2 diabetes mellitus with unspecified complications: Secondary | ICD-10-CM

## 2020-05-22 NOTE — Progress Notes (Signed)
Bilateral knee and shin bone pain  Numbness in hands

## 2020-05-22 NOTE — Progress Notes (Signed)
Subjective:    Lorraine Holt - 54 y.o. female MRN [id number]  Date of birth: [birth year]  HPI  Lorraine Holt is here for follow up of chronic medical conditions.  Diabetes mellitus, Type 2 Disease Monitoring             Blood Sugar Ranges: Fasting 120s      Polyuria: no             Visual problems: no   Urine Microalbumin 12 (Sept 2021)   Last A1C: 7.9 (Sept 2021)   Medications: Metformin 500 mg AM and 1000 mg PM  Medication Compliance: yes  Medication Side Effects             Hypoglycemia: no    Chronic HTN Disease Monitoring:  Home BP Monitoring - Not monitoring  Chest pain- no  Dyspnea- no Headache - no  Medications: Coreg 3.125 mg BID, Indapamide 1.25 mg  Compliance- yes Lightheadedness- no  Edema- no   Knee Pain: Bilateral. Chronic for years, seems to be worsening. Here's crunching and grinding. Applies topical pain relief gel and uses brace with good relief and control of pain. No falls or legs giving way. No numbness or tingling or legs. No weakness.      Health Maintenance:  Health Maintenance Due  Topic Date Due  . OPHTHALMOLOGY EXAM  12/28/2018  . MAMMOGRAM  01/26/2019  . FOOT EXAM  05/09/2020    -  reports that she has been smoking cigarettes. She has a 10.00 pack-year smoking history. She has never used smokeless tobacco. - Review of Systems: Per HPI. - Past Medical History: Patient Active Problem List   Diagnosis Date Noted  . Claudication of both lower extremities ([place]) 05/10/2019  . Chronic diastolic heart failure ([place]) 05/10/2019  . Mucopurulent chronic bronchitis ([place]) 05/10/2019  . Visit for screening mammogram 07/18/2018  . LVH (left ventricular hypertrophy) due to hypertensive disease, with heart failure ([place]) 12/28/2017  . Elevated brain natriuretic peptide (BNP) level 12/20/2017  . Localized edema 12/19/2017  . Meralgia paresthetica, bilateral lower limbs 04/19/2017  . Palpitations 09/28/2016  . Chronic idiopathic  constipation 05/25/2016  . OSA (obstructive sleep apnea) 05/25/2016  . Primary osteoarthritis of both knees 05/25/2016  . Tobacco abuse 07/23/2015  . Obstructive chronic bronchitis without exacerbation ([place]) 07/23/2015  . Iron deficiency anemia 05/22/2015  . Abnormal mammogram of both breasts 10/01/2014  . Severe obesity (BMI >= 40) ([place]) 07/23/2014  . Carpal tunnel syndrome of right wrist 06/26/2014  . Routine general medical examination at a health care facility 01/07/2014  . Hyperlipidemia with target LDL less than 100 08/08/2013  . Cervical cancer screening 04/30/2013  . Nonspecific abnormal electrocardiogram (ECG) (EKG) 07/12/2012  . Type II diabetes mellitus with manifestations ([place]) 02/21/2012  . Essential hypertension, benign 02/21/2012   - Medications: reviewed and updated   Objective:   Physical Exam BP 110/77 (BP Location: Left Arm, Patient Position: Sitting)   Pulse 88   Ht 5' 10.5" (1.791 m)   Wt (!) 331 lb 6.4 oz (150.3 kg)   LMP 08/08/2014   SpO2 96%   BMI 46.88 kg/m  Physical Exam Constitutional:      General: She is not in acute distress.    Appearance: She is not diaphoretic.  Cardiovascular:     Rate and Rhythm: Normal rate.  Pulmonary:     Effort: Pulmonary effort is normal. No respiratory distress.  Musculoskeletal:        General: Normal range of motion.  Skin:    General: Skin is warm and dry.  Neurological:     Mental Status: She is alert and oriented to person, place, and time.  Psychiatric:        Mood and Affect: Affect normal.        Judgment: Judgment normal.            Assessment & Plan:   1. Type II diabetes mellitus with manifestations (HCC) Last A1c slightly above goal with result of 7.9%. At that visit, metformin dose was increased. Patient reports compliance and good control of fasting glucose. Repeat A1c to monitor and direct therapy.  - Hemoglobin A1c; Future  2. Essential hypertension BP at goal today. Continue current  regimen. Asymptomatic.   3. Hyperlipidemia with target LDL less than 100 Continue Pravastatin.   4. Chronic pain of both knees Suspect related to OA. Right knee x-ray obtained in 2015 showed moderate degenerative changes of OA. Discussed supportive care measures for pain relief and that ultimately, weight loss improves pain associated with OA. Will repeat imaging of right knee and obtain imaging for left. Discussed pending results could consider PT and/or orthopedics referral.  - DG Knee Complete 4 Views Left; Future - DG Knee Complete 4 Views Right; Future    Phill Myron, D.O. 05/22/2020, 9:44 AM Primary Care at [facility]

## 2020-07-16 LAB — HM DIABETES EYE EXAM

## 2020-09-25 ENCOUNTER — Other Ambulatory Visit: Payer: Self-pay | Admitting: Internal Medicine

## 2020-09-25 ENCOUNTER — Telehealth: Payer: Self-pay | Admitting: Internal Medicine

## 2020-09-25 DIAGNOSIS — I10 Essential (primary) hypertension: Secondary | ICD-10-CM

## 2020-09-25 DIAGNOSIS — I110 Hypertensive heart disease with heart failure: Secondary | ICD-10-CM

## 2020-09-25 MED ORDER — INDAPAMIDE 1.25 MG PO TABS: 1.2500 mg | ORAL_TABLET | Freq: Every day | ORAL | 0 refills | Status: DC

## 2020-09-25 NOTE — Telephone Encounter (Signed)
1.Medication Requested: indapamide (LOZOL) 1.25 MG tablet    2. Pharmacy (Name, Street, [place]): [facility], [address]  3. On Med List: yes   4. Last Visit with PCP: 06-13-19  5. Next visit date with PCP: 10-28-20  Patient is re establishing care 10-28-20. She was wondering if Dr. Ronnald Ramp could refill the above medication until she can see him. Please advise    Agent: Please be advised that RX refills may take up to 3 business days. We ask that you follow-up with your pharmacy.

## 2020-10-28 ENCOUNTER — Other Ambulatory Visit: Payer: Self-pay

## 2020-10-28 ENCOUNTER — Encounter: Payer: Self-pay | Admitting: Internal Medicine

## 2020-10-28 ENCOUNTER — Encounter: Payer: No Typology Code available for payment source | Admitting: Internal Medicine

## 2020-10-28 ENCOUNTER — Ambulatory Visit (INDEPENDENT_AMBULATORY_CARE_PROVIDER_SITE_OTHER): Payer: 59 | Admitting: Internal Medicine

## 2020-10-28 VITALS — BP 152/94 | HR 89 | Temp 98.1°F | Ht 70.5 in | Wt 329.0 lb

## 2020-10-28 DIAGNOSIS — E785 Hyperlipidemia, unspecified: Secondary | ICD-10-CM

## 2020-10-28 DIAGNOSIS — I110 Hypertensive heart disease with heart failure: Secondary | ICD-10-CM | POA: Diagnosis not present

## 2020-10-28 DIAGNOSIS — Z23 Encounter for immunization: Secondary | ICD-10-CM | POA: Diagnosis not present

## 2020-10-28 DIAGNOSIS — E118 Type 2 diabetes mellitus with unspecified complications: Secondary | ICD-10-CM | POA: Diagnosis not present

## 2020-10-28 DIAGNOSIS — I10 Essential (primary) hypertension: Secondary | ICD-10-CM | POA: Diagnosis not present

## 2020-10-28 DIAGNOSIS — Z720 Tobacco use: Secondary | ICD-10-CM

## 2020-10-28 DIAGNOSIS — I5032 Chronic diastolic (congestive) heart failure: Secondary | ICD-10-CM | POA: Diagnosis not present

## 2020-10-28 LAB — CBC WITH DIFFERENTIAL/PLATELET
Basophils Absolute: 0 10*3/uL (ref 0.0–0.1)
Basophils Relative: 0.6 % (ref 0.0–3.0)
Eosinophils Absolute: 0.2 10*3/uL (ref 0.0–0.7)
Eosinophils Relative: 3.7 % (ref 0.0–5.0)
HCT: 44.5 % (ref 36.0–46.0)
Hemoglobin: 14.7 g/dL (ref 12.0–15.0)
Lymphocytes Relative: 35 % (ref 12.0–46.0)
Lymphs Abs: 2.2 10*3/uL (ref 0.7–4.0)
MCHC: 33 g/dL (ref 30.0–36.0)
MCV: 89 fl (ref 78.0–100.0)
Monocytes Absolute: 0.5 10*3/uL (ref 0.1–1.0)
Monocytes Relative: 7.4 % (ref 3.0–12.0)
Neutro Abs: 3.3 10*3/uL (ref 1.4–7.7)
Neutrophils Relative %: 53.3 % (ref 43.0–77.0)
Platelets: 241 10*3/uL (ref 150.0–400.0)
RBC: 5 Mil/uL (ref 3.87–5.11)
RDW: 14.3 % (ref 11.5–15.5)
WBC: 6.2 10*3/uL (ref 4.0–10.5)

## 2020-10-28 LAB — URINALYSIS, ROUTINE W REFLEX MICROSCOPIC
Bilirubin Urine: NEGATIVE
Hgb urine dipstick: NEGATIVE
Ketones, ur: NEGATIVE
Leukocytes,Ua: NEGATIVE
Nitrite: NEGATIVE
Specific Gravity, Urine: 1.015 (ref 1.000–1.030)
Total Protein, Urine: NEGATIVE
Urine Glucose: NEGATIVE
Urobilinogen, UA: 0.2 (ref 0.0–1.0)
pH: 5.5 (ref 5.0–8.0)

## 2020-10-28 LAB — HEMOGLOBIN A1C: Hgb A1c MFr Bld: 7.5 % — ABNORMAL HIGH (ref 4.6–6.5)

## 2020-10-28 LAB — BASIC METABOLIC PANEL
BUN: 11 mg/dL (ref 6–23)
CO2: 31 mEq/L (ref 19–32)
Calcium: 9.8 mg/dL (ref 8.4–10.5)
Chloride: 101 mEq/L (ref 96–112)
Creatinine, Ser: 0.68 mg/dL (ref 0.40–1.20)
GFR: 96.76 mL/min (ref >60.00)
Glucose, Bld: 117 mg/dL — ABNORMAL HIGH (ref 70–99)
Potassium: 3.8 mEq/L (ref 3.5–5.1)
Sodium: 139 mEq/L (ref 135–145)

## 2020-10-28 LAB — MICROALBUMIN / CREATININE URINE RATIO
Creatinine,U: 53.9 mg/dL
Microalb Creat Ratio: 3.1 mg/g (ref 0.0–30.0)
Microalb, Ur: 1.7 mg/dL (ref 0.0–1.9)

## 2020-10-28 MED ORDER — INDAPAMIDE 1.25 MG PO TABS: 1.2500 mg | ORAL_TABLET | Freq: Every day | ORAL | 1 refills | Status: DC

## 2020-10-28 MED ORDER — PRAVASTATIN SODIUM 40 MG PO TABS: 40.0000 mg | ORAL_TABLET | Freq: Every day | ORAL | 1 refills | Status: DC

## 2020-10-28 MED ORDER — CARVEDILOL 3.125 MG PO TABS: 3.1250 mg | ORAL_TABLET | Freq: Two times a day (BID) | ORAL | 1 refills | Status: DC

## 2020-10-28 MED ORDER — SYNJARDY 5-500 MG PO TABS: 1.0000 | ORAL_TABLET | Freq: Two times a day (BID) | ORAL | 0 refills | Status: DC

## 2020-10-28 NOTE — Progress Notes (Signed)
Subjective:  Patient ID: Lorraine Holt, female    DOB: [birth year]  Age: 55 y.o. MRN: [id number]  CC: Hypertension and Diabetes  This visit occurred during the SARS-CoV-2 public health emergency.  Safety protocols were in place, including screening questions prior to the visit, additional usage of staff PPE, and extensive cleaning of exam room while observing appropriate contact time as indicated for disinfecting solutions.    HPI [name] presents for f/up -   She is concerned that her blood pressure is not well controlled.  She is active and denies any recent episodes of chest pain, shortness of breath, edema, diaphoresis, headache, dizziness, or lightheadedness.  She tells me her blood sugar fasting is usually in the 120-130 range.  She has mild increased thirst but denies polyphagia or polyuria.  Outpatient Medications Prior to Visit  Medication Sig Dispense Refill  . ACCU-CHEK GUIDE test strip USE TO TEST UP TO TWICE DAILY 100 each 11  . aspirin EC 81 MG tablet Take 1 tablet (81 mg total) by mouth daily. 90 tablet 1  . TRUEPLUS LANCETS 30G MISC USE TWICE DAILY 100 each 11  . furosemide (LASIX) 20 MG tablet Take 20 mg by mouth daily as needed for fluid.    . indapamide (LOZOL) 1.25 MG tablet Take 1 tablet (1.25 mg total) by mouth daily. 90 tablet 0  . metFORMIN (GLUCOPHAGE) 500 MG tablet Take 1 tablet with breakfast and take 2 tablets with dinner daily. 270 tablet 1  . potassium chloride (KLOR-CON) 10 MEQ tablet Take 1 tablet (10 mEq total) by mouth daily. 90 tablet 1  . carvedilol (COREG) 3.125 MG tablet Take 1 tablet (3.125 mg total) by mouth 2 (two) times daily with a meal. 180 tablet 1  . pravastatin (PRAVACHOL) 40 MG tablet Take 1 tablet (40 mg total) by mouth daily. 90 tablet 1   No facility-administered medications prior to visit.    ROS Review of Systems  Constitutional: Negative for chills, diaphoresis, fatigue and fever.  HENT: Negative.   Eyes: Negative.    Respiratory: Negative for cough, chest tightness, shortness of breath and wheezing.   Cardiovascular: Negative for chest pain, palpitations and leg swelling.  Gastrointestinal: Negative for abdominal pain, constipation, diarrhea, nausea and vomiting.  Endocrine: Positive for polydipsia. Negative for polyphagia and polyuria.  Genitourinary: Negative.  Negative for difficulty urinating.  Musculoskeletal: Positive for arthralgias. Negative for myalgias.  Skin: Negative.  Negative for color change.  Neurological: Negative.  Negative for dizziness, weakness and light-headedness.  Hematological: Negative for adenopathy. Does not bruise/bleed easily.  Psychiatric/Behavioral: Negative.     Objective:  BP (!) 152/94 (BP Location: Right Arm, Patient Position: Sitting, Cuff Size: Large)   Pulse 89   Temp 98.1 F (36.7 C) (Oral)   Ht 5' 10.5" (1.791 m)   Wt (!) 329 lb (149.2 kg)   LMP 08/08/2014   SpO2 95%   BMI 46.54 kg/m   BP Readings from Last 3 Encounters:  10/28/20 (!) 152/94  05/22/20 110/77  02/29/20 126/84    Wt Readings from Last 3 Encounters:  10/28/20 (!) 329 lb (149.2 kg)  05/22/20 (!) 331 lb 6.4 oz (150.3 kg)  02/29/20 (!) 332 lb (150.6 kg)    Physical Exam Vitals reviewed.  Constitutional:      Appearance: She is obese. She is not ill-appearing.  HENT:     Nose: Nose normal.     Mouth/Throat:     Mouth: Mucous membranes are moist.  Eyes:  General: No scleral icterus.    Conjunctiva/sclera: Conjunctivae normal.  Cardiovascular:     Rate and Rhythm: Normal rate and regular rhythm.     Heart sounds: No murmur heard.   Pulmonary:     Effort: Pulmonary effort is normal.     Breath sounds: No stridor. No wheezing, rhonchi or rales.  Abdominal:     General: Abdomen is protuberant. Bowel sounds are normal. There is no distension.     Palpations: Abdomen is soft. There is no hepatomegaly, splenomegaly or mass.     Tenderness: There is no abdominal tenderness.   Musculoskeletal:        General: Normal range of motion.     Cervical back: Neck supple.     Right lower leg: No edema.     Left lower leg: No edema.  Lymphadenopathy:     Cervical: No cervical adenopathy.  Skin:    General: Skin is warm and dry.  Neurological:     General: No focal deficit present.     Mental Status: She is alert.  Psychiatric:        Mood and Affect: Mood normal.        Behavior: Behavior normal.     Lab Results  Component Value Date   WBC 6.2 10/28/2020   HGB 14.7 10/28/2020   HCT 44.5 10/28/2020   PLT 241.0 10/28/2020   GLUCOSE 117 (H) 10/28/2020   CHOL 177 02/29/2020   TRIG 93 02/29/2020   HDL 48 02/29/2020   LDLCALC 112 (H) 02/29/2020   ALT 15 02/29/2020   AST 14 02/29/2020   NA 139 10/28/2020   K 3.8 10/28/2020   CL 101 10/28/2020   CREATININE 0.68 10/28/2020   BUN 11 10/28/2020   CO2 31 10/28/2020   TSH 3.130 02/29/2020   HGBA1C 7.5 (H) 10/28/2020   MICROALBUR 1.7 10/28/2020    No results found.  Assessment & Plan:   Sindi was seen today for hypertension and diabetes.  Diagnoses and all orders for this visit:  Chronic diastolic heart failure ([place])- I have asked her to start taking the empagliflozin for risk reduction. -     Basic metabolic panel; Future -     Basic metabolic panel -     Empagliflozin-metFORMIN HCl (SYNJARDY) 5-500 MG TABS; Take 1 tablet by mouth 2 (two) times daily.  LVH (left ventricular hypertrophy) due to hypertensive disease, with heart failure ([place])- Her blood pressure is not adequately well controlled.  She has a normal volume status.  Will add carvedilol to indapamide. -     Basic metabolic panel; Future -     carvedilol (COREG) 3.125 MG tablet; Take 1 tablet (3.125 mg total) by mouth 2 (two) times daily with a meal. -     indapamide (LOZOL) 1.25 MG tablet; Take 1 tablet (1.25 mg total) by mouth daily. -     Basic metabolic panel  Type II diabetes mellitus with manifestations ([place])- Her A1c is at 7.5%.   Will add an SGLT2 inhibitor to metformin. -     Basic metabolic panel; Future -     Microalbumin / creatinine urine ratio; Future -     Hemoglobin A1c; Future -     Ambulatory referral to Ophthalmology -     HM Diabetes Foot Exam -     Hemoglobin A1c -     Microalbumin / creatinine urine ratio -     Basic metabolic panel -     Empagliflozin-metFORMIN HCl (SYNJARDY) 5-500  MG TABS; Take 1 tablet by mouth 2 (two) times daily.  Essential hypertension, benign- Her blood pressure is not adequately well controlled.  Will add carvedilol to indapamide. -     CBC with Differential/Platelet; Future -     Basic metabolic panel; Future -     Urinalysis, Routine w reflex microscopic; Future -     carvedilol (COREG) 3.125 MG tablet; Take 1 tablet (3.125 mg total) by mouth 2 (two) times daily with a meal. -     indapamide (LOZOL) 1.25 MG tablet; Take 1 tablet (1.25 mg total) by mouth daily. -     Urinalysis, Routine w reflex microscopic -     Basic metabolic panel -     CBC with Differential/Platelet  Hyperlipidemia with target LDL less than 100 -     pravastatin (PRAVACHOL) 40 MG tablet; Take 1 tablet (40 mg total) by mouth daily.  Tobacco abuse -     Ambulatory Referral for Lung Cancer Scre  Need for vaccination -     Pneumococcal conjugate vaccine 20-valent  Other orders -     Varicella-zoster vaccine IM (Shingrix)   I have discontinued Keajah M. Uddin's potassium chloride, metFORMIN, and furosemide. I am also having her start on Synjardy. Additionally, I am having her maintain her TRUEplus Lancets 30G, Accu-Chek Guide, aspirin EC, pravastatin, carvedilol, and indapamide.  Meds ordered this encounter  Medications  . pravastatin (PRAVACHOL) 40 MG tablet    Sig: Take 1 tablet (40 mg total) by mouth daily.    Dispense:  90 tablet    Refill:  1  . carvedilol (COREG) 3.125 MG tablet    Sig: Take 1 tablet (3.125 mg total) by mouth 2 (two) times daily with a meal.    Dispense:  180 tablet     Refill:  1  . indapamide (LOZOL) 1.25 MG tablet    Sig: Take 1 tablet (1.25 mg total) by mouth daily.    Dispense:  90 tablet    Refill:  1  . Empagliflozin-metFORMIN HCl (SYNJARDY) 5-500 MG TABS    Sig: Take 1 tablet by mouth 2 (two) times daily.    Dispense:  180 tablet    Refill:  0     Follow-up: Return in about 3 months (around 01/28/2021).  Scarlette Calico, MD

## 2020-10-28 NOTE — Patient Instructions (Signed)

## 2020-10-29 ENCOUNTER — Encounter: Payer: Self-pay | Admitting: Internal Medicine

## 2020-10-30 ENCOUNTER — Encounter: Payer: Self-pay | Admitting: Internal Medicine

## 2020-11-10 ENCOUNTER — Telehealth: Payer: Self-pay | Admitting: Acute Care

## 2020-11-10 ENCOUNTER — Other Ambulatory Visit: Payer: Self-pay | Admitting: *Deleted

## 2020-11-10 DIAGNOSIS — F17210 Nicotine dependence, cigarettes, uncomplicated: Secondary | ICD-10-CM

## 2020-11-10 NOTE — Telephone Encounter (Signed)
Spoke with pt regarding lung cancer screening. Gave pt the billing codes to check her coverage. Will await call back. Will close this message and refer to referral notes.

## 2020-12-10 ENCOUNTER — Ambulatory Visit
Admission: RE | Admit: 2020-12-10 | Discharge: 2020-12-10 | Disposition: A | Discharge status: Home / Self Care | Payer: 59 | Source: Ambulatory Visit | Attending: Acute Care | Admitting: Acute Care

## 2020-12-10 ENCOUNTER — Encounter: Payer: Self-pay | Admitting: Acute Care

## 2020-12-10 ENCOUNTER — Ambulatory Visit (INDEPENDENT_AMBULATORY_CARE_PROVIDER_SITE_OTHER): Payer: 59 | Admitting: Acute Care

## 2020-12-10 ENCOUNTER — Other Ambulatory Visit: Payer: Self-pay

## 2020-12-10 VITALS — BP 142/84 | HR 82 | Temp 98.4°F | Ht 71.0 in | Wt 324.4 lb

## 2020-12-10 DIAGNOSIS — F17210 Nicotine dependence, cigarettes, uncomplicated: Secondary | ICD-10-CM | POA: Diagnosis not present

## 2020-12-10 NOTE — Progress Notes (Signed)
Shared Decision Making Visit Lung Cancer Screening Program [phone])   Eligibility: Age 55 y.o. Pack Years Smoking History Calculation 32 pack year smoking history (# packs/per year x # years smoked) Recent History of coughing up blood  no Unexplained weight loss? no ( >Than 15 pounds within the last 6 months ) Prior History Lung / other cancer no (Diagnosis within the last 5 years already requiring surveillance chest CT Scans). Smoking Status Current Smoker Former Smokers: Years since quit: NA  Quit Date: NA  Visit Components: Discussion included one or more decision making aids. yes Discussion included risk/benefits of screening. yes Discussion included potential follow up diagnostic testing for abnormal scans. yes Discussion included meaning and risk of over diagnosis. yes Discussion included meaning and risk of False Positives. yes Discussion included meaning of total radiation exposure. yes  Counseling Included: Importance of adherence to annual lung cancer LDCT screening. yes Impact of comorbidities on ability to participate in the program. yes Ability and willingness to under diagnostic treatment. yes  Smoking Cessation Counseling: Current Smokers:  Discussed importance of smoking cessation. yes Information about tobacco cessation classes and interventions provided to patient. yes Patient provided with "ticket" for LDCT Scan. yes Symptomatic Patient. no  Counseling Diagnosis Code: Tobacco Use Z72.0 Asymptomatic Patient yes  Counseling (Intermediate counseling: > three minutes counseling) P3825 Former Smokers:  Discussed the importance of maintaining cigarette abstinence. yes Diagnosis Code: Personal History of Nicotine Dependence. K53.976 Information about tobacco cessation classes and interventions provided to patient. Yes Patient provided with "ticket" for LDCT Scan. yes Written Order for Lung Cancer Screening with LDCT placed in Epic. Yes (CT Chest Lung Cancer  Screening Low Dose W/O CM) [id number] Z12.2-Screening of respiratory organs Z87.891-Personal history of nicotine dependence  I have spent 25 minutes of face to face time with Ms. Smartt discussing the risks and benefits of lung cancer screening. We viewed a power point together that explained in detail the above noted topics. We paused at intervals to allow for questions to be asked and answered to ensure understanding.We discussed that the single most powerful action that she can take to decrease her risk of developing lung cancer is to quit smoking. We discussed whether or not she is ready to commit to setting a quit date. We discussed options for tools to aid in quitting smoking including nicotine replacement therapy, non-nicotine medications, support groups, Quit Smart classes, and behavior modification. We discussed that often times setting smaller, more achievable goals, such as eliminating 1 cigarette a day for a week and then 2 cigarettes a day for a week can be helpful in slowly decreasing the number of cigarettes smoked. This allows for a sense of accomplishment as well as providing a clinical benefit. I gave her the " Be Stronger Than Your Excuses" card with contact information for community resources, classes, free nicotine replacement therapy, and access to mobile apps, text messaging, and on-line smoking cessation help. I have also given her my card and contact information in the event she needs to contact me. We discussed the time and location of the scan, and that either Doroteo Glassman RN or I will call with the results within 24-48 hours of receiving them. I have offered her  a copy of the power point we viewed  as a resource in the event they need reinforcement of the concepts we discussed today in the office. The patient verbalized understanding of all of  the above and had no further questions upon leaving the office. They have  my contact information in the event they have any further  questions.  I spent 4-5 minutes counseling on smoking cessation and the health risks of continued tobacco abuse.  I explained to the patient that there has been a high incidence of coronary artery disease noted on these exams. I explained that this is a non-gated exam therefore degree or severity cannot be determined. This patient is on statin therapy. I have asked the patient to follow-up with their PCP regarding any incidental finding of coronary artery disease and management with diet or medication as their PCP  feels is clinically indicated. The patient verbalized understanding of the above and had no further questions upon completion of the visit.      Magdalen Spatz, NP 12/10/2020

## 2020-12-10 NOTE — Patient Instructions (Signed)
Thank you for participating in the [facility]. It was our pleasure to meet you today. We will call you with the results of your scan within the next few days. Your scan will be assigned a Lung RADS category score by the physicians reading the scans.  This Lung RADS score determines follow up scanning.  See below for description of categories, and follow up screening recommendations. We will be in touch to schedule your follow up screening annually or based on recommendations of our providers. We will fax a copy of your scan results to your Primary Care Physician, or the physician who referred you to the program, to ensure they have the results. Please call the office if you have any questions or concerns regarding your scanning experience or results.  Our office number is [phone]. Please speak with Denise Phelps, RN. She is our Lung Cancer Screening RN. If she is unavailable when you call, please have the office staff send her a message. She will return your call at her earliest convenience. Remember, if your scan is normal, we will scan you annually as long as you continue to meet the criteria for the program. (Age 55-77, Current smoker or smoker who has quit within the last 15 years). If you are a smoker, remember, quitting is the single most powerful action that you can take to decrease your risk of lung cancer and other pulmonary, breathing related problems. We know quitting is hard, and we are here to help.  Please let us know if there is anything we can do to help you meet your goal of quitting. If you are a former smoker, congratulations. We are proud of you! Remain smoke free! Remember you can refer friends or family members through the number above.  We will screen them to make sure they meet criteria for the program. Thank you for helping us take better care of you by participating in Lung Screening.  Lung RADS Categories:  Lung RADS 1: no nodules  or definitely non-concerning nodules.  Recommendation is for a repeat annual scan in 12 months.  Lung RADS 2:  nodules that are non-concerning in appearance and behavior with a very low likelihood of becoming an active cancer. Recommendation is for a repeat annual scan in 12 months.  Lung RADS 3: nodules that are probably non-concerning , includes nodules with a low likelihood of becoming an active cancer.  Recommendation is for a 6-month repeat screening scan. Often noted after an upper respiratory illness. We will be in touch to make sure you have no questions, and to schedule your 6-month scan.  Lung RADS 4 A: nodules with concerning findings, recommendation is most often for a follow up scan in 3 months or additional testing based on our provider's assessment of the scan. We will be in touch to make sure you have no questions and to schedule the recommended 3 month follow up scan.  Lung RADS 4 B:  indicates findings that are concerning. We will be in touch with you to schedule additional diagnostic testing based on our provider's  assessment of the scan.   

## 2020-12-11 ENCOUNTER — Other Ambulatory Visit: Payer: Self-pay | Admitting: *Deleted

## 2020-12-11 DIAGNOSIS — F17210 Nicotine dependence, cigarettes, uncomplicated: Secondary | ICD-10-CM

## 2020-12-11 NOTE — Progress Notes (Signed)
Please call patient and let them  know their  low dose Ct was read as a Lung  RADS 3, nodules that are probably benign findings, short term follow up suggested: includes nodules with a low likelihood of becoming a clinically active cancer. Radiology recommends a 6 month repeat LDCT follow up. .Please let them  know we will order and schedule their  annual screening scan for 06/2021. Please let them  know there was notation of CAD on their  scan.  Please remind the patient  that this is a non-gated exam therefore degree or severity of disease  cannot be determined. Please have them  follow up with their PCP regarding potential risk factor modification, dietary therapy or pharmacologic therapy if clinically indicated. Pt.  is  currently on statin therapy. Please place order for 6 month screening scan for  06/2021 and fax results to PCP. Thanks so much.  Langley Gauss, I have called the patient . I explained that her scan was read as a Lung  RADS 3, nodules that are probably benign findings, short term follow up suggested: includes nodules with a low likelihood of becoming a clinically active cancer. Radiology recommends a 6 month repeat LDCT follow up. She verbalized understanding. I told her we will call her to get it scheduled 06/2021. She verbalized understanding. She has our number if she has any additional questions. Thanks so much

## 2021-01-05 ENCOUNTER — Other Ambulatory Visit (HOSPITAL_COMMUNITY): Payer: Self-pay

## 2021-01-22 ENCOUNTER — Other Ambulatory Visit: Payer: Self-pay | Admitting: Internal Medicine

## 2021-01-22 DIAGNOSIS — I10 Essential (primary) hypertension: Secondary | ICD-10-CM

## 2021-01-22 DIAGNOSIS — I110 Hypertensive heart disease with heart failure: Secondary | ICD-10-CM

## 2021-01-28 ENCOUNTER — Encounter: Payer: Self-pay | Admitting: Internal Medicine

## 2021-01-28 ENCOUNTER — Ambulatory Visit (INDEPENDENT_AMBULATORY_CARE_PROVIDER_SITE_OTHER): Payer: 59 | Admitting: Internal Medicine

## 2021-01-28 ENCOUNTER — Other Ambulatory Visit: Payer: Self-pay

## 2021-01-28 VITALS — BP 138/88 | HR 81 | Temp 98.5°F | Resp 16 | Ht 71.0 in | Wt 325.0 lb

## 2021-01-28 DIAGNOSIS — Z23 Encounter for immunization: Secondary | ICD-10-CM | POA: Diagnosis not present

## 2021-01-28 DIAGNOSIS — I700 Atherosclerosis of aorta: Secondary | ICD-10-CM | POA: Insufficient documentation

## 2021-01-28 DIAGNOSIS — M25362 Other instability, left knee: Secondary | ICD-10-CM | POA: Insufficient documentation

## 2021-01-28 DIAGNOSIS — E118 Type 2 diabetes mellitus with unspecified complications: Secondary | ICD-10-CM | POA: Diagnosis not present

## 2021-01-28 DIAGNOSIS — I10 Essential (primary) hypertension: Secondary | ICD-10-CM

## 2021-01-28 LAB — BASIC METABOLIC PANEL
BUN: 12 mg/dL (ref 6–23)
CO2: 28 mEq/L (ref 19–32)
Calcium: 9.4 mg/dL (ref 8.4–10.5)
Chloride: 103 mEq/L (ref 96–112)
Creatinine, Ser: 0.82 mg/dL (ref 0.40–1.20)
GFR: 79.33 mL/min (ref >60.00)
Glucose, Bld: 87 mg/dL (ref 70–99)
Potassium: 3.7 mEq/L (ref 3.5–5.1)
Sodium: 139 mEq/L (ref 135–145)

## 2021-01-28 LAB — HEMOGLOBIN A1C: Hgb A1c MFr Bld: 7.3 % — ABNORMAL HIGH (ref 4.6–6.5)

## 2021-01-28 NOTE — Patient Instructions (Signed)

## 2021-01-28 NOTE — Progress Notes (Signed)
Subjective:  Patient ID: Lorraine Holt, female    DOB: Oct 26, 1965  Age: 55 y.o. MRN: [id number]  CC: Osteoarthritis, Hypertension, and Diabetes  This visit occurred during the SARS-CoV-2 public health emergency.  Safety protocols were in place, including screening questions prior to the visit, additional usage of staff PPE, and extensive cleaning of exam room while observing appropriate contact time as indicated for disinfecting solutions.    HPI Lorraine Holt presents for f/up -   She complains of worsening bilateral knee pain, worse on the left than the right.  She complains that the left knee has become unstable and buckles.  The knee pain interferes with her activities but when she is active she denies chest pain, shortness of breath, diaphoresis, or edema.  Outpatient Medications Prior to Visit  Medication Sig Dispense Refill   ACCU-CHEK GUIDE test strip USE TO TEST UP TO TWICE DAILY 100 each 11   aspirin EC 81 MG tablet Take 1 tablet (81 mg total) by mouth daily. 90 tablet 1   carvedilol (COREG) 3.125 MG tablet Take 1 tablet (3.125 mg total) by mouth 2 (two) times daily with a meal. 180 tablet 1   Empagliflozin-metFORMIN HCl (SYNJARDY) 5-500 MG TABS Take 1 tablet by mouth 2 (two) times daily. 180 tablet 0   indapamide (LOZOL) 1.25 MG tablet Take 1 tablet by mouth once daily 90 tablet 0   pravastatin (PRAVACHOL) 40 MG tablet Take 1 tablet (40 mg total) by mouth daily. 90 tablet 1   TRUEPLUS LANCETS 30G MISC USE TWICE DAILY 100 each 11   No facility-administered medications prior to visit.    ROS Review of Systems  Constitutional:  Negative for chills, diaphoresis and fatigue.  HENT: Negative.    Eyes: Negative.   Respiratory:  Negative for cough, chest tightness, shortness of breath and wheezing.   Cardiovascular:  Negative for chest pain, palpitations and leg swelling.  Gastrointestinal:  Negative for abdominal pain, constipation, diarrhea, nausea and vomiting.  Endocrine:  Negative.   Genitourinary: Negative.  Negative for difficulty urinating and dysuria.  Musculoskeletal:  Positive for arthralgias. Negative for back pain, myalgias and neck pain.  Skin: Negative.   Neurological:  Negative for dizziness, weakness, light-headedness, numbness and headaches.  Hematological:  Negative for adenopathy. Does not bruise/bleed easily.  Psychiatric/Behavioral: Negative.     Objective:  BP 138/88 (BP Location: Right Arm, Patient Position: Sitting, Cuff Size: Large)   Pulse 81   Temp 98.5 F (36.9 C) (Oral)   Resp 16   Ht '5\' 11"'$  (1.803 m)   Wt (!) 325 lb (147.4 kg)   LMP 08/08/2014   SpO2 95%   BMI 45.33 kg/m   BP Readings from Last 3 Encounters:  01/28/21 138/88  12/10/20 (!) 142/84  10/28/20 (!) 152/94    Wt Readings from Last 3 Encounters:  01/28/21 (!) 325 lb (147.4 kg)  12/10/20 (!) 324 lb 6.4 oz (147.1 kg)  10/28/20 (!) 329 lb (149.2 kg)    Physical Exam HENT:     Nose: Nose normal.     Mouth/Throat:     Mouth: Mucous membranes are moist.  Eyes:     General: No scleral icterus.    Conjunctiva/sclera: Conjunctivae normal.  Cardiovascular:     Rate and Rhythm: Normal rate and regular rhythm.     Heart sounds: No murmur heard. Pulmonary:     Effort: Pulmonary effort is normal.     Breath sounds: No stridor. No wheezing, rhonchi or rales.  Abdominal:     General: Abdomen is protuberant. Bowel sounds are normal. There is no distension.     Palpations: Abdomen is soft. There is no hepatomegaly, splenomegaly or mass.     Tenderness: There is no abdominal tenderness. There is no guarding.  Musculoskeletal:        General: Deformity (DJD) present. Normal range of motion.     Cervical back: Neck supple.     Right lower leg: No edema.     Left lower leg: No edema.  Skin:    General: Skin is warm and dry.  Neurological:     General: No focal deficit present.     Mental Status: She is alert.  Psychiatric:        Mood and Affect: Mood normal.         Behavior: Behavior normal.    Lab Results  Component Value Date   WBC 6.2 10/28/2020   HGB 14.7 10/28/2020   HCT 44.5 10/28/2020   PLT 241.0 10/28/2020   GLUCOSE 87 01/28/2021   CHOL 177 02/29/2020   TRIG 93 02/29/2020   HDL 48 02/29/2020   LDLCALC 112 (H) 02/29/2020   ALT 15 02/29/2020   AST 14 02/29/2020   NA 139 01/28/2021   K 3.7 01/28/2021   CL 103 01/28/2021   CREATININE 0.82 01/28/2021   BUN 12 01/28/2021   CO2 28 01/28/2021   TSH 3.130 02/29/2020   HGBA1C 7.3 (H) 01/28/2021   MICROALBUR 1.7 10/28/2020    CT CHEST LUNG CA SCREEN LOW DOSE W/O CM  Result Date: 12/11/2020 CLINICAL DATA:  Thirty-two pack-year smoking history. Current smoker. EXAM: CT CHEST WITHOUT CONTRAST LOW-DOSE FOR LUNG CANCER SCREENING TECHNIQUE: Multidetector CT imaging of the chest was performed following the standard protocol without IV contrast. COMPARISON:  12/19/2017 chest radiograph. No prior CT. FINDINGS: Cardiovascular: Aortic atherosclerosis. Normal heart size, without pericardial effusion. Lad coronary artery calcification. Mediastinum/Nodes: No mediastinal or definite hilar adenopathy, given limitations of unenhanced CT. Lungs/Pleura: No pleural fluid. Mild centrilobular emphysema. Isolated right upper lobe solid pulmonary nodule of volume derived equivalent diameter 7.4 mm including on 55/5 Upper Abdomen: Normal imaged portions of the liver, spleen, stomach, pancreas, adrenal glands, left kidney. Musculoskeletal: Mid and lower thoracic spondylosis. Mild convex right thoracic spine curvature. IMPRESSION: 1. Lung-RADS 3, probably benign findings. Short-term follow-up in 6 months is recommended with repeat low-dose chest CT without contrast (please use the following order, "CT CHEST LCS NODULE FOLLOW-UP W/O CM"). Right upper lobe pulmonary nodule of volume derived equivalent diameter 7.4 mm. 2. Aortic Atherosclerosis (ICD10-I70.0) and Emphysema (ICD10-J43.9). 3. Age advanced coronary artery  atherosclerosis. Recommend assessment of coronary risk factors and consideration of medical therapy. Electronically Signed   By: Abigail Miyamoto M.D.   On: 12/11/2020 11:14    Assessment & Plan:   Meilan was seen today for osteoarthritis, hypertension and diabetes.  Diagnoses and all orders for this visit:  Atherosclerosis of aorta ([place])- Risk factor modifications have been addressed.  Type II diabetes mellitus with manifestations ([place])- Her blood sugar is adequately well controlled.  Will continue the combination of metformin and an SGLT2 inhibitor. -     Hemoglobin A1c; Future -     Basic metabolic panel; Future -     Basic metabolic panel -     Hemoglobin A1c  Unstable knee, left -     Ambulatory referral to Orthopedic Surgery  Essential hypertension, benign- Her blood pressure is adequately well controlled.  Will continue the combination  of indapamide and carvedilol. -     Basic metabolic panel; Future -     Basic metabolic panel  Need for vaccination -     Varicella-zoster vaccine IM (Shingrix)  I am having Lorraine Holt maintain her TRUEplus Lancets 30G, Accu-Chek Guide, aspirin EC, pravastatin, carvedilol, Synjardy, and indapamide.  No orders of the defined types were placed in this encounter.    Follow-up: Return in about 3 months (around 04/30/2021).  Scarlette Calico, MD

## 2021-02-10 ENCOUNTER — Telehealth: Payer: Self-pay

## 2021-02-10 DIAGNOSIS — I5032 Chronic diastolic (congestive) heart failure: Secondary | ICD-10-CM

## 2021-02-10 DIAGNOSIS — E118 Type 2 diabetes mellitus with unspecified complications: Secondary | ICD-10-CM

## 2021-02-10 MED ORDER — SYNJARDY 5-500 MG PO TABS: 1.0000 | ORAL_TABLET | Freq: Two times a day (BID) | ORAL | 0 refills | Status: DC

## 2021-02-10 NOTE — Telephone Encounter (Signed)
Pt states she continues to take medication as prescribed. States she still has pills left b/c she started taking them approx 2weeks after prescribed b/c of the cost. Medication prescribed 10/28/20 for 28mo Since PCP out of office, will refill per protocol to pharmacy on file; last OV 01/28/21 will refill based on provider notes Pt instructed to pick up medication by 02/12/21; verb understanding.

## 2021-02-11 ENCOUNTER — Ambulatory Visit: Payer: Self-pay

## 2021-02-11 ENCOUNTER — Ambulatory Visit (INDEPENDENT_AMBULATORY_CARE_PROVIDER_SITE_OTHER): Payer: 59 | Admitting: Orthopaedic Surgery

## 2021-02-11 ENCOUNTER — Ambulatory Visit (INDEPENDENT_AMBULATORY_CARE_PROVIDER_SITE_OTHER): Payer: 59

## 2021-02-11 ENCOUNTER — Other Ambulatory Visit: Payer: Self-pay

## 2021-02-11 DIAGNOSIS — Z6841 Body Mass Index (BMI) 40.0 and over, adult: Secondary | ICD-10-CM

## 2021-02-11 DIAGNOSIS — M1712 Unilateral primary osteoarthritis, left knee: Secondary | ICD-10-CM | POA: Diagnosis not present

## 2021-02-11 DIAGNOSIS — M1711 Unilateral primary osteoarthritis, right knee: Secondary | ICD-10-CM | POA: Diagnosis not present

## 2021-02-11 MED ORDER — BUPIVACAINE HCL 0.25 % IJ SOLN
0.6600 mL | INTRAMUSCULAR | Status: AC | PRN
  Administered 2021-02-11: .66 mL via INTRA_ARTICULAR

## 2021-02-11 MED ORDER — METHYLPREDNISOLONE ACETATE 40 MG/ML IJ SUSP
13.3300 mg | INTRAMUSCULAR | Status: AC | PRN
  Administered 2021-02-11: 13.33 mg via INTRA_ARTICULAR

## 2021-02-11 MED ORDER — LIDOCAINE HCL 1 % IJ SOLN
3.0000 mL | INTRAMUSCULAR | Status: AC | PRN
  Administered 2021-02-11: 3 mL

## 2021-02-11 MED ORDER — BUPIVACAINE HCL 0.25 % IJ SOLN
0.6600 mL | INTRAMUSCULAR | Status: AC | PRN
Start: 2021-02-11 — End: 2021-02-11
  Administered 2021-02-11: .66 mL via INTRA_ARTICULAR

## 2021-02-11 NOTE — Progress Notes (Signed)
Office Visit Note   Patient: Lorraine Holt           Date of Birth: [birth year]           MRN: [id number] Visit Date: 02/11/2021              Requested by: Janith Lima, MD [address] [place],  [place] [address] PCP: Janith Lima, MD   Assessment & Plan: Visit Diagnoses:  1. Primary osteoarthritis of right knee   2. Primary osteoarthritis of left knee   3. Body mass index 40.0-44.9, adult ([place])   4. Morbid obesity ([place])     Plan: Impression is advanced degenerative joint disease both knees.  Today, we discussed cortisone injection in addition to viscosupplementation injection.  She would like to try cortisone injections first.  Viscosupplementation handout provided.  We also discussed total knee replacement at some point in the future.  She will follow-up with [place] as needed.  The patient meets the AMA guidelines for Morbid (severe) obesity with a BMI > 40.0 and I have recommended weight loss.  Follow-Up Instructions: Return if symptoms worsen or fail to improve.   Orders:  Orders Placed This Encounter  Procedures   Large Joint Inj: bilateral knee   XR KNEE 3 VIEW RIGHT   XR KNEE 3 VIEW LEFT   No orders of the defined types were placed in this encounter.     Procedures: Large Joint Inj: bilateral knee on 02/11/2021 11:52 AM Indications: pain Details: 22 G needle, anterolateral approach Medications (Right): 0.66 mL bupivacaine 0.25 %; 3 mL lidocaine 1 %; 13.33 mg methylPREDNISolone acetate 40 MG/ML Medications (Left): 0.66 mL bupivacaine 0.25 %; 3 mL lidocaine 1 %; 13.33 mg methylPREDNISolone acetate 40 MG/ML     Clinical Data: No additional findings.   Subjective: Chief Complaint  Patient presents with   Right Knee - New Patient (Initial Visit)   Left Knee - New Patient (Initial Visit)    HPI patient is a pleasant 54 year old female who comes in today with bilateral knee pain left.  The right.  She has been dealing with this for the past several years.   This is recently worsened.  No known injury or change in activity.  The pain she has is primarily to the medial aspect but she does note radiating pain down the anterior tibia on the left.  Walking seems to aggravate her symptoms most.  She has been using support hose and taking Tylenol without significant relief.  She thinks she may have had a previous cortisone injections in one of the knees.  No previous viscosupplementation injections.  She has been to several months of physical therapy without relief in symptoms.  Review of Systems as detailed in HPI.  All others reviewed and are negative.   Objective: Vital Signs: LMP 08/08/2014   Physical Exam well-developed well-nourished female no acute distress.  Alert and oriented x3.  Ortho Exam bilateral knee exam shows trace effusion.  Range of motion 0 to 115 degrees.  Medial and lateral joint line tenderness.  Marked palpable crepitus.  Ligaments are stable.  She is neurovascular tact distally.  Specialty Comments:  No specialty comments available.  Imaging: XR KNEE 3 VIEW LEFT  Result Date: 02/11/2021 Bone-on-bone medial compartment with moderate changes to the patellofemoral compartment  XR KNEE 3 VIEW RIGHT  Result Date: 02/11/2021 Bone-on-bone medial compartment with moderate changes to the patellofemoral compartment    PMFS History: Patient Active Problem List  Diagnosis Date Noted   Atherosclerosis of aorta ([place]) 01/28/2021   Unstable knee, left 01/28/2021   Claudication of both lower extremities ([place]) 05/10/2019   Chronic diastolic heart failure ([place]) 05/10/2019   Mucopurulent chronic bronchitis ([place]) 05/10/2019   Visit for screening mammogram 07/18/2018   LVH (left ventricular hypertrophy) due to hypertensive disease, with heart failure ([place]) 12/28/2017   Meralgia paresthetica, bilateral lower limbs 04/19/2017   Palpitations 09/28/2016   Chronic idiopathic constipation 05/25/2016   OSA (obstructive sleep apnea) 05/25/2016    Primary osteoarthritis of both knees 05/25/2016   Tobacco abuse 07/23/2015   Obstructive chronic bronchitis without exacerbation ([place]) 07/23/2015   Iron deficiency anemia 05/22/2015   Abnormal mammogram of both breasts 10/01/2014   Severe obesity (BMI >= 40) ([place]) 07/23/2014   Carpal tunnel syndrome of right wrist 06/26/2014   Routine general medical examination at a health care facility 01/07/2014   Hyperlipidemia with target LDL less than 100 08/08/2013   Cervical cancer screening 04/30/2013   Nonspecific abnormal electrocardiogram (ECG) (EKG) 07/12/2012   Type II diabetes mellitus with manifestations ([place]) 02/21/2012   Essential hypertension, benign 02/21/2012   Past Medical History:  Diagnosis Date   Anemia    history age 13'S   Arthritis    [employer]rees[employer] 02/24/2020   Bronchitis    Carpal tunnel syndrome of right wrist    Diabetes mellitus    Diabetes mellitus without complication ([place])    [employer]reesia 02/24/2020   Hyperlipidemia    Hypertension    Obesity    Peripheral vascular disease ([place])    undiagnosis, but complains of numbness in legs   Shortness of breath dyspnea    with exertion   Sleep apnea    [employer]reesia 02/24/2020   Tooth abscess 09/30/14   tooth was extracted    Family History  Problem Relation Age of Onset   Diabetes Maternal Aunt    Breast cancer Maternal Aunt    Hypertension Mother    Diabetes Mother    Breast cancer Mother    Diabetes Maternal Uncle    Breast cancer Maternal Aunt    COPD Neg Hx    Early death Neg Hx    Heart disease Neg Hx    Hyperlipidemia Neg Hx    Kidney disease Neg Hx    Stroke Neg Hx     Past Surgical History:  Procedure Laterality Date   ABDOMINAL HYSTERECTOMY  09/2015   BILATERAL SALPINGECTOMY Right 09/17/2014   Procedure: RIGHT SALPINGECTOMY;  Surgeon: Lavonia Drafts, MD;  Location: [facility];  Service: Gynecology;  Laterality: Right;   BREAST BIOPSY Left    COLONOSCOPY  04/2014   DILATION AND CURETTAGE OF UTERUS      ROBOTIC ASSISTED TOTAL HYSTERECTOMY N/A 09/17/2014   Procedure: ROBOTIC ASSISTED TOTAL HYSTERECTOMY;  Surgeon: Lavonia Drafts, MD;  Location: [facility];  Service: Gynecology;  Laterality: N/A;   TOOTH EXTRACTION  09/30/14   TUBAL LIGATION     Social History   Occupational History   Occupation: [employer]  Tobacco Use   Smoking status: Every Day    Packs/day: 0.50    Years: 20.00    Pack years: 10.00    Types: Cigarettes   Smokeless tobacco: Never  Substance and Sexual Activity   Alcohol use: No   Drug use: No   Sexual activity: Never    Birth control/protection: Surgical

## 2021-03-16 LAB — HM DIABETES EYE EXAM

## 2021-04-20 ENCOUNTER — Other Ambulatory Visit: Payer: Self-pay | Admitting: Internal Medicine

## 2021-04-20 DIAGNOSIS — E785 Hyperlipidemia, unspecified: Secondary | ICD-10-CM

## 2021-04-20 DIAGNOSIS — I110 Hypertensive heart disease with heart failure: Secondary | ICD-10-CM

## 2021-04-20 DIAGNOSIS — I10 Essential (primary) hypertension: Secondary | ICD-10-CM

## 2021-04-29 ENCOUNTER — Ambulatory Visit: Payer: 59 | Admitting: Internal Medicine

## 2021-05-26 ENCOUNTER — Other Ambulatory Visit: Payer: Self-pay | Admitting: Internal Medicine

## 2021-05-26 ENCOUNTER — Telehealth: Payer: Self-pay | Admitting: Internal Medicine

## 2021-05-26 DIAGNOSIS — E118 Type 2 diabetes mellitus with unspecified complications: Secondary | ICD-10-CM

## 2021-05-26 DIAGNOSIS — I5032 Chronic diastolic (congestive) heart failure: Secondary | ICD-10-CM

## 2021-05-26 MED ORDER — SYNJARDY 5-500 MG PO TABS: 1.0000 | ORAL_TABLET | Freq: Two times a day (BID) | ORAL | 0 refills | Status: DC

## 2021-06-16 ENCOUNTER — Other Ambulatory Visit: Payer: Self-pay | Admitting: Internal Medicine

## 2021-06-16 DIAGNOSIS — E118 Type 2 diabetes mellitus with unspecified complications: Secondary | ICD-10-CM

## 2021-06-16 MED ORDER — XIGDUO XR 5-1000 MG PO TB24: 1.0000 | ORAL_TABLET | Freq: Every day | ORAL | 0 refills | Status: DC

## 2021-06-16 NOTE — Telephone Encounter (Signed)
[facility] calling in  Pharmacy says Empagliflozin-metFORMIN HCl (SYNJARDY) 5-500 MG TABS is not covered under patient insurance (PA needed)..   Insurance says they preferred Xigduo [id number] [phone]  Please discuss & contact patient if PA will be done or if rx will be changed to [employer] [phone]

## 2021-06-18 ENCOUNTER — Ambulatory Visit (INDEPENDENT_AMBULATORY_CARE_PROVIDER_SITE_OTHER): Payer: Self-pay | Admitting: Internal Medicine

## 2021-06-18 ENCOUNTER — Other Ambulatory Visit: Payer: Self-pay

## 2021-06-18 ENCOUNTER — Encounter: Payer: Self-pay | Admitting: Internal Medicine

## 2021-06-18 VITALS — BP 136/88 | HR 81 | Temp 98.0°F | Resp 16 | Ht 71.0 in | Wt 328.0 lb

## 2021-06-18 DIAGNOSIS — K5904 Chronic idiopathic constipation: Secondary | ICD-10-CM

## 2021-06-18 DIAGNOSIS — Z0000 Encounter for general adult medical examination without abnormal findings: Secondary | ICD-10-CM

## 2021-06-18 DIAGNOSIS — Z1231 Encounter for screening mammogram for malignant neoplasm of breast: Secondary | ICD-10-CM

## 2021-06-18 DIAGNOSIS — I10 Essential (primary) hypertension: Secondary | ICD-10-CM

## 2021-06-18 DIAGNOSIS — E118 Type 2 diabetes mellitus with unspecified complications: Secondary | ICD-10-CM

## 2021-06-18 DIAGNOSIS — Z23 Encounter for immunization: Secondary | ICD-10-CM

## 2021-06-18 DIAGNOSIS — E785 Hyperlipidemia, unspecified: Secondary | ICD-10-CM

## 2021-06-18 LAB — HEPATIC FUNCTION PANEL
ALT: 17 U/L (ref 0–35)
AST: 16 U/L (ref 0–37)
Albumin: 3.9 g/dL (ref 3.5–5.2)
Alkaline Phosphatase: 118 U/L — ABNORMAL HIGH (ref 39–117)
Bilirubin, Direct: 0.1 mg/dL (ref 0.0–0.3)
Total Bilirubin: 0.6 mg/dL (ref 0.2–1.2)
Total Protein: 7.4 g/dL (ref 6.0–8.3)

## 2021-06-18 LAB — BASIC METABOLIC PANEL
BUN: 11 mg/dL (ref 6–23)
CO2: 29 mEq/L (ref 19–32)
Calcium: 9.4 mg/dL (ref 8.4–10.5)
Chloride: 102 mEq/L (ref 96–112)
Creatinine, Ser: 0.68 mg/dL (ref 0.40–1.20)
GFR: 96.33 mL/min (ref >60.00)
Glucose, Bld: 122 mg/dL — ABNORMAL HIGH (ref 70–99)
Potassium: 3.6 mEq/L (ref 3.5–5.1)
Sodium: 139 mEq/L (ref 135–145)

## 2021-06-18 LAB — CBC WITH DIFFERENTIAL/PLATELET
Basophils Absolute: 0 10*3/uL (ref 0.0–0.1)
Basophils Relative: 1 % (ref 0.0–3.0)
Eosinophils Absolute: 0.2 10*3/uL (ref 0.0–0.7)
Eosinophils Relative: 3.3 % (ref 0.0–5.0)
HCT: 46.3 % — ABNORMAL HIGH (ref 36.0–46.0)
Hemoglobin: 14.8 g/dL (ref 12.0–15.0)
Lymphocytes Relative: 34.3 % (ref 12.0–46.0)
Lymphs Abs: 1.6 10*3/uL (ref 0.7–4.0)
MCHC: 31.9 g/dL (ref 30.0–36.0)
MCV: 89.6 fl (ref 78.0–100.0)
Monocytes Absolute: 0.5 10*3/uL (ref 0.1–1.0)
Monocytes Relative: 11.2 % (ref 3.0–12.0)
Neutro Abs: 2.4 10*3/uL (ref 1.4–7.7)
Neutrophils Relative %: 50.2 % (ref 43.0–77.0)
Platelets: 254 10*3/uL (ref 150.0–400.0)
RBC: 5.17 Mil/uL — ABNORMAL HIGH (ref 3.87–5.11)
RDW: 14.9 % (ref 11.5–15.5)
WBC: 4.7 10*3/uL (ref 4.0–10.5)

## 2021-06-18 LAB — LIPID PANEL
Cholesterol: 161 mg/dL (ref 0–200)
HDL: 48.7 mg/dL (ref >39.00)
LDL Cholesterol: 93 mg/dL (ref 0–99)
NonHDL: 112.15
Total CHOL/HDL Ratio: 3
Triglycerides: 98 mg/dL (ref 0.0–149.0)
VLDL: 19.6 mg/dL (ref 0.0–40.0)

## 2021-06-18 LAB — TSH: TSH: 2.41 u[IU]/mL (ref 0.35–5.50)

## 2021-06-18 LAB — HEMOGLOBIN A1C: Hgb A1c MFr Bld: 7.8 % — ABNORMAL HIGH (ref 4.6–6.5)

## 2021-06-18 MED ORDER — RYBELSUS 3 MG PO TABS: 3.0000 mg | ORAL_TABLET | Freq: Every day | ORAL | 0 refills | Status: DC

## 2021-06-18 MED ORDER — XIGDUO XR 5-1000 MG PO TB24: 1.0000 | ORAL_TABLET | Freq: Every day | ORAL | 0 refills | Status: DC

## 2021-06-18 NOTE — Patient Instructions (Signed)

## 2021-06-18 NOTE — Progress Notes (Signed)
Subjective:  Patient ID: Lorraine Holt, female    DOB: 1965-11-08  Age: 56 y.o. MRN: [id number]  CC: Annual Exam, Hypertension, Hyperlipidemia, and Diabetes  This visit occurred during the SARS-CoV-2 public health emergency.  Safety protocols were in place, including screening questions prior to the visit, additional usage of staff PPE, and extensive cleaning of exam room while observing appropriate contact time as indicated for disinfecting solutions.    HPI Lorraine Holt presents for a CPX and f/up -   She walks quite a bit and does not experience chest pain, shortness of breath, diaphoresis, dizziness, lightheadedness, or palpitations.  Outpatient Medications Prior to Visit  Medication Sig Dispense Refill   ACCU-CHEK GUIDE test strip USE TO TEST UP TO TWICE DAILY 100 each 11   carvedilol (COREG) 3.125 MG tablet TAKE 1 TABLET BY MOUTH TWICE DAILY WITH A MEAL 180 tablet 0   indapamide (LOZOL) 1.25 MG tablet Take 1 tablet by mouth once daily 90 tablet 0   TRUEPLUS LANCETS 30G MISC USE TWICE DAILY 100 each 11   aspirin EC 81 MG tablet Take 1 tablet (81 mg total) by mouth daily. 90 tablet 1   Dapagliflozin-metFORMIN HCl ER (XIGDUO XR) 10-998 MG TB24 Take 1 tablet by mouth daily. 90 tablet 0   pravastatin (PRAVACHOL) 40 MG tablet Take 1 tablet by mouth once daily 90 tablet 0   No facility-administered medications prior to visit.    ROS Review of Systems  Constitutional:  Positive for unexpected weight change (wt gain). Negative for diaphoresis and fatigue.  HENT: Negative.    Eyes: Negative.   Respiratory:  Negative for cough, chest tightness, shortness of breath and wheezing.   Cardiovascular:  Negative for chest pain, palpitations and leg swelling.  Gastrointestinal:  Positive for constipation (this is better). Negative for abdominal pain, diarrhea, nausea and vomiting.  Endocrine: Negative.   Genitourinary: Negative.  Negative for difficulty urinating.  Musculoskeletal:  Negative  for myalgias.  Skin: Negative.   Neurological: Negative.  Negative for dizziness, weakness, light-headedness and headaches.  Hematological:  Negative for adenopathy. Does not bruise/bleed easily.  Psychiatric/Behavioral: Negative.     Objective:  BP 136/88 (BP Location: Right Arm, Patient Position: Sitting, Cuff Size: Large)    Pulse 81    Temp 98 F (36.7 C) (Oral)    Resp 16    Ht 5\' 11"  (1.803 m)    Wt (!) 328 lb (148.8 kg)    LMP 08/08/2014    SpO2 95%    BMI 45.75 kg/m   BP Readings from Last 3 Encounters:  06/18/21 136/88  01/28/21 138/88  12/10/20 (!) 142/84    Wt Readings from Last 3 Encounters:  06/18/21 (!) 328 lb (148.8 kg)  01/28/21 (!) 325 lb (147.4 kg)  12/10/20 (!) 324 lb 6.4 oz (147.1 kg)    Physical Exam Vitals reviewed.  Constitutional:      Appearance: She is obese. She is not ill-appearing.  HENT:     Mouth/Throat:     Mouth: Mucous membranes are moist.  Eyes:     General: No scleral icterus.    Conjunctiva/sclera: Conjunctivae normal.  Cardiovascular:     Rate and Rhythm: Normal rate and regular rhythm.     Heart sounds: No murmur heard. Pulmonary:     Effort: Pulmonary effort is normal.     Breath sounds: No stridor. No wheezing, rhonchi or rales.  Abdominal:     General: Abdomen is flat.  Palpations: There is no mass.     Tenderness: There is no abdominal tenderness. There is no guarding or rebound.     Hernia: No hernia is present.  Musculoskeletal:        General: No swelling.     Cervical back: Neck supple.     Right lower leg: No edema.     Left lower leg: No edema.  Lymphadenopathy:     Cervical: No cervical adenopathy.  Skin:    General: Skin is warm and dry.  Neurological:     General: No focal deficit present.     Mental Status: She is alert. Mental status is at baseline.  Psychiatric:        Mood and Affect: Mood normal.        Behavior: Behavior normal.    Lab Results  Component Value Date   WBC 4.7 06/18/2021   HGB  14.8 06/18/2021   HCT 46.3 (H) 06/18/2021   PLT 254.0 06/18/2021   GLUCOSE 122 (H) 06/18/2021   CHOL 161 06/18/2021   TRIG 98.0 06/18/2021   HDL 48.70 06/18/2021   LDLCALC 93 06/18/2021   ALT 17 06/18/2021   AST 16 06/18/2021   NA 139 06/18/2021   K 3.6 06/18/2021   CL 102 06/18/2021   CREATININE 0.68 06/18/2021   BUN 11 06/18/2021   CO2 29 06/18/2021   TSH 2.41 06/18/2021   HGBA1C 7.8 (H) 06/18/2021   MICROALBUR 1.7 10/28/2020    CT CHEST LUNG CA SCREEN LOW DOSE W/O CM  Result Date: 12/11/2020 CLINICAL DATA:  Thirty-two pack-year smoking history. Current smoker. EXAM: CT CHEST WITHOUT CONTRAST LOW-DOSE FOR LUNG CANCER SCREENING TECHNIQUE: Multidetector CT imaging of the chest was performed following the standard protocol without IV contrast. COMPARISON:  12/19/2017 chest radiograph. No prior CT. FINDINGS: Cardiovascular: Aortic atherosclerosis. Normal heart size, without pericardial effusion. Lad coronary artery calcification. Mediastinum/Nodes: No mediastinal or definite hilar adenopathy, given limitations of unenhanced CT. Lungs/Pleura: No pleural fluid. Mild centrilobular emphysema. Isolated right upper lobe solid pulmonary nodule of volume derived equivalent diameter 7.4 mm including on 55/5 Upper Abdomen: Normal imaged portions of the liver, spleen, stomach, pancreas, adrenal glands, left kidney. Musculoskeletal: Mid and lower thoracic spondylosis. Mild convex right thoracic spine curvature. IMPRESSION: 1. Lung-RADS 3, probably benign findings. Short-term follow-up in 6 months is recommended with repeat low-dose chest CT without contrast (please use the following order, "CT CHEST LCS NODULE FOLLOW-UP W/O CM"). Right upper lobe pulmonary nodule of volume derived equivalent diameter 7.4 mm. 2. Aortic Atherosclerosis (ICD10-I70.0) and Emphysema (ICD10-J43.9). 3. Age advanced coronary artery atherosclerosis. Recommend assessment of coronary risk factors and consideration of medical therapy.  Electronically Signed   By: Abigail Miyamoto M.D.   On: 12/11/2020 11:14    Assessment & Plan:   Lorraine Holt was seen today for annual exam, hypertension, hyperlipidemia and diabetes.  Diagnoses and all orders for this visit:  Essential hypertension, benign- Her blood pressure is adequately well controlled. -     TSH; Future -     Hepatic function panel; Future -     Hepatic function panel -     TSH  Type II diabetes mellitus with manifestations ([place])- Her A1c is up to 7.8%.  I will add a GLP-1 agonist to metformin and the SGLT2 inhibitor. -     Basic metabolic panel; Future -     Hemoglobin A1c; Future -     Semaglutide (RYBELSUS) 3 MG TABS; Take 3 mg by mouth daily. -  Dapagliflozin-metFORMIN HCl ER (XIGDUO XR) 10-998 MG TB24; Take 1 tablet by mouth daily. -     Hemoglobin A1c -     Basic metabolic panel  Chronic idiopathic constipation- This has improved.  Labs are negative for secondary causes. -     TSH; Future -     TSH  Hyperlipidemia with target LDL less than 100- LDL goal achieved. Doing well on the statin  -     Lipid panel; Future -     CBC with Differential/Platelet; Future -     TSH; Future -     Hepatic function panel; Future -     Hepatic function panel -     TSH -     CBC with Differential/Platelet -     Lipid panel -     pravastatin (PRAVACHOL) 40 MG tablet; Take 1 tablet (40 mg total) by mouth daily.  Routine general medical examination at a health care facility- Exam completed, labs reviewed, vaccines reviewed and updated, cancer screenings addressed, patient education was given.  Visit for screening mammogram -     MM DIGITAL SCREENING BILATERAL; Future  Other orders -     Flu Vaccine QUAD 6+ mos PF IM (Fluarix Quad PF)   I have discontinued Olene Godfrey. Grisby's aspirin EC. I have also changed her pravastatin. Additionally, I am having her start on Rybelsus. Lastly, I am having her maintain her TRUEplus Lancets 30G, Accu-Chek Guide, indapamide, carvedilol, and  Xigduo XR.  Meds ordered this encounter  Medications   Semaglutide (RYBELSUS) 3 MG TABS    Sig: Take 3 mg by mouth daily.    Dispense:  30 tablet    Refill:  0   Dapagliflozin-metFORMIN HCl ER (XIGDUO XR) 10-998 MG TB24    Sig: Take 1 tablet by mouth daily.    Dispense:  90 tablet    Refill:  0   pravastatin (PRAVACHOL) 40 MG tablet    Sig: Take 1 tablet (40 mg total) by mouth daily.    Dispense:  90 tablet    Refill:  1     Follow-up: Return in about 6 months (around 12/16/2021).  Scarlette Calico, MD

## 2021-06-19 MED ORDER — PRAVASTATIN SODIUM 40 MG PO TABS: 40.0000 mg | ORAL_TABLET | Freq: Every day | ORAL | 1 refills | Status: DC

## 2021-06-23 ENCOUNTER — Telehealth: Payer: Self-pay | Admitting: Internal Medicine

## 2021-06-23 NOTE — Telephone Encounter (Signed)
PA requests for rybelsus and Xigduo have been submitted    Awaiting response, follow up in 1 day   Xigduo Copay card details:

## 2021-06-23 NOTE — Telephone Encounter (Signed)
1.Medication Requested: Dapagliflozin-metFORMIN HCl ER (XIGDUO XR) 10-998 MG TB24- insurance is requesting that she try another form of medication first.   Semaglutide (RYBELSUS) 3 MG TABS- pt insurance does not cover this form.    2. Pharmacy (Name, [address] [place]): [place], [place] Phone:  [phone]  Fax:  [phone]     3. On Med List: y   9. Last Visit with PCP: 1.12.2023  5. Next visit date with PCP: 7.12.2023   Agent: Please be advised that RX refills may take up to 3 business days. We ask that you follow-up with your pharmacy.

## 2021-06-25 ENCOUNTER — Other Ambulatory Visit: Payer: Self-pay | Admitting: Internal Medicine

## 2021-06-25 DIAGNOSIS — E118 Type 2 diabetes mellitus with unspecified complications: Secondary | ICD-10-CM

## 2021-06-25 MED ORDER — TRULICITY 0.75 MG/0.5ML ~~LOC~~ SOAJ: 0.7500 mg | SUBCUTANEOUS | 0 refills | Status: DC

## 2021-06-25 NOTE — Telephone Encounter (Signed)
Rybelsus PA denies - preferred agents trulicity / victoza   Xigduo PA -approved - copay card called into pharmacy - $0 copay  Discussed with patient about starting trulicity 0.75mg  weekly, explained MOA and AE of trulicity - patient was agreeable to start trulicity   Patient was given trulicity copay card, aware to reach out should trulicity copay be unaffordable

## 2021-06-26 ENCOUNTER — Telehealth: Payer: Self-pay

## 2021-06-26 NOTE — Telephone Encounter (Signed)
Key: [id number]

## 2021-06-29 NOTE — Telephone Encounter (Signed)
approved from 06/29/2021 to 06/29/2022

## 2021-07-01 ENCOUNTER — Ambulatory Visit
Admission: RE | Admit: 2021-07-01 | Discharge: 2021-07-01 | Disposition: A | Discharge status: Home / Self Care | Payer: Self-pay | Source: Ambulatory Visit | Attending: Acute Care | Admitting: Acute Care

## 2021-07-01 ENCOUNTER — Other Ambulatory Visit: Payer: Self-pay

## 2021-07-01 DIAGNOSIS — F17210 Nicotine dependence, cigarettes, uncomplicated: Secondary | ICD-10-CM

## 2021-07-02 ENCOUNTER — Telehealth: Payer: Self-pay | Admitting: Acute Care

## 2021-07-02 ENCOUNTER — Other Ambulatory Visit: Payer: Self-pay

## 2021-07-02 DIAGNOSIS — F17210 Nicotine dependence, cigarettes, uncomplicated: Secondary | ICD-10-CM

## 2021-07-02 NOTE — Telephone Encounter (Signed)
Results of LDCT called to patient.  Pulmonary nodule read as benign and appears stable.  No concern for cancer at this time.  Patient acknowledged understanding and had no further questions

## 2021-07-19 ENCOUNTER — Other Ambulatory Visit: Payer: Self-pay | Admitting: Internal Medicine

## 2021-07-19 DIAGNOSIS — I10 Essential (primary) hypertension: Secondary | ICD-10-CM

## 2021-07-19 DIAGNOSIS — I110 Hypertensive heart disease with heart failure: Secondary | ICD-10-CM

## 2021-07-20 DIAGNOSIS — Z1231 Encounter for screening mammogram for malignant neoplasm of breast: Secondary | ICD-10-CM | POA: Diagnosis not present

## 2021-07-20 LAB — HM MAMMOGRAPHY

## 2021-07-27 ENCOUNTER — Other Ambulatory Visit: Payer: Self-pay | Admitting: Internal Medicine

## 2021-07-27 DIAGNOSIS — E118 Type 2 diabetes mellitus with unspecified complications: Secondary | ICD-10-CM

## 2021-07-27 MED ORDER — TRULICITY 1.5 MG/0.5ML ~~LOC~~ SOAJ: 1.5000 mg | SUBCUTANEOUS | 0 refills | Status: DC

## 2021-08-25 ENCOUNTER — Other Ambulatory Visit: Payer: Self-pay | Admitting: Internal Medicine

## 2021-08-25 ENCOUNTER — Telehealth: Payer: Self-pay | Admitting: Internal Medicine

## 2021-08-25 DIAGNOSIS — E118 Type 2 diabetes mellitus with unspecified complications: Secondary | ICD-10-CM

## 2021-08-25 NOTE — Telephone Encounter (Signed)
PT calls today in regards to a prescription issue. PT was informed by her pharmacy that she could not receive her Dapagliflozin-metFORMIN HCl ER (XIGDUO XR) 10-998 MG TB24 until a form is put in from Dr.Jones. PT wasn't sure if this was another prescription needing to be put in or if it was a prior authorization needed! ? ?CB: [phone] ?

## 2021-08-26 NOTE — Telephone Encounter (Signed)
I have spoke to the pharmacy in regard to [facility] and was informed that medication did not need a PA and that it could be ready for pick up today. ? ?Pt has been informed and inquired if her Trulicity was refilled. I stated that PCP sent refill in yesterday for that Rx and she expressed understanding.  ?

## 2021-09-21 ENCOUNTER — Other Ambulatory Visit: Payer: Self-pay | Admitting: Internal Medicine

## 2021-09-21 DIAGNOSIS — E118 Type 2 diabetes mellitus with unspecified complications: Secondary | ICD-10-CM

## 2021-10-17 ENCOUNTER — Other Ambulatory Visit: Payer: Self-pay | Admitting: Internal Medicine

## 2021-10-17 DIAGNOSIS — E118 Type 2 diabetes mellitus with unspecified complications: Secondary | ICD-10-CM

## 2021-10-21 ENCOUNTER — Telehealth: Payer: Self-pay | Admitting: Internal Medicine

## 2021-10-21 NOTE — Telephone Encounter (Signed)
Pt states that she needs a manufacturer coupon' applied to her TRULICITY RX. She says Dr. Ronnald Ramp normally gives her the coupon/ applies it to her RX. W/O this coupon she says she can not afford her medication.  ? ?Please advise. ? ?Phone #: [phone] ?

## 2021-10-22 NOTE — Telephone Encounter (Signed)
Called and spoke with pharmacy   Per pharmacy - pt has [place] coverage, previously when run through insurance pt copay was $25, this has now increased to $145 - pharmacy has copay card on file, when run through copay card, increases pt portion to >$200  Patient is not eligible for patient assistance through [facility] as they are no longer accepting new patients for trulicity, would not be eligible for novocares as she has commercial insurance  Left message with patient, advised for her to call her insurance company to question why copay is now higher, if unaffordable pt may need to change therapy   Tomasa Blase, PharmD Clinical Pharmacist, [facility]

## 2021-10-22 NOTE — Telephone Encounter (Signed)
PT has called back twice this morning to check on the status of the 'manufacturer coupon'. Please fax coupon so pt may pick up her medication.

## 2021-10-26 ENCOUNTER — Other Ambulatory Visit: Payer: Self-pay

## 2021-11-18 ENCOUNTER — Other Ambulatory Visit: Payer: Self-pay | Admitting: Internal Medicine

## 2021-11-18 DIAGNOSIS — I110 Hypertensive heart disease with heart failure: Secondary | ICD-10-CM

## 2021-11-18 DIAGNOSIS — I10 Essential (primary) hypertension: Secondary | ICD-10-CM

## 2021-12-04 ENCOUNTER — Other Ambulatory Visit: Payer: Self-pay | Admitting: Internal Medicine

## 2021-12-04 DIAGNOSIS — E118 Type 2 diabetes mellitus with unspecified complications: Secondary | ICD-10-CM

## 2021-12-16 ENCOUNTER — Encounter: Payer: Self-pay | Admitting: Internal Medicine

## 2021-12-16 ENCOUNTER — Ambulatory Visit (INDEPENDENT_AMBULATORY_CARE_PROVIDER_SITE_OTHER): Payer: 59 | Admitting: Internal Medicine

## 2021-12-16 VITALS — BP 130/86 | HR 84 | Temp 98.0°F | Ht 71.0 in | Wt 311.0 lb

## 2021-12-16 DIAGNOSIS — E785 Hyperlipidemia, unspecified: Secondary | ICD-10-CM | POA: Diagnosis not present

## 2021-12-16 DIAGNOSIS — R202 Paresthesia of skin: Secondary | ICD-10-CM | POA: Diagnosis not present

## 2021-12-16 DIAGNOSIS — E118 Type 2 diabetes mellitus with unspecified complications: Secondary | ICD-10-CM | POA: Diagnosis not present

## 2021-12-16 DIAGNOSIS — I10 Essential (primary) hypertension: Secondary | ICD-10-CM | POA: Diagnosis not present

## 2021-12-16 DIAGNOSIS — I700 Atherosclerosis of aorta: Secondary | ICD-10-CM

## 2021-12-16 DIAGNOSIS — D508 Other iron deficiency anemias: Secondary | ICD-10-CM | POA: Diagnosis not present

## 2021-12-16 DIAGNOSIS — R200 Anesthesia of skin: Secondary | ICD-10-CM | POA: Insufficient documentation

## 2021-12-16 LAB — CBC WITH DIFFERENTIAL/PLATELET
Basophils Absolute: 0 10*3/uL (ref 0.0–0.1)
Basophils Relative: 0.8 % (ref 0.0–3.0)
Eosinophils Absolute: 0.2 10*3/uL (ref 0.0–0.7)
Eosinophils Relative: 3.8 % (ref 0.0–5.0)
HCT: 44.8 % (ref 36.0–46.0)
Hemoglobin: 14.4 g/dL (ref 12.0–15.0)
Lymphocytes Relative: 35.2 % (ref 12.0–46.0)
Lymphs Abs: 1.8 10*3/uL (ref 0.7–4.0)
MCHC: 32.2 g/dL (ref 30.0–36.0)
MCV: 90.1 fl (ref 78.0–100.0)
Monocytes Absolute: 0.4 10*3/uL (ref 0.1–1.0)
Monocytes Relative: 7.7 % (ref 3.0–12.0)
Neutro Abs: 2.7 10*3/uL (ref 1.4–7.7)
Neutrophils Relative %: 52.5 % (ref 43.0–77.0)
Platelets: 236 10*3/uL (ref 150.0–400.0)
RBC: 4.97 Mil/uL (ref 3.87–5.11)
RDW: 14.8 % (ref 11.5–15.5)
WBC: 5.1 10*3/uL (ref 4.0–10.5)

## 2021-12-16 LAB — BASIC METABOLIC PANEL
BUN: 14 mg/dL (ref 6–23)
CO2: 29 mEq/L (ref 19–32)
Calcium: 9.6 mg/dL (ref 8.4–10.5)
Chloride: 102 mEq/L (ref 96–112)
Creatinine, Ser: 0.73 mg/dL (ref 0.40–1.20)
GFR: 90.65 mL/min (ref >60.00)
Glucose, Bld: 101 mg/dL — ABNORMAL HIGH (ref 70–99)
Potassium: 3.7 mEq/L (ref 3.5–5.1)
Sodium: 136 mEq/L (ref 135–145)

## 2021-12-16 LAB — MICROALBUMIN / CREATININE URINE RATIO
Creatinine,U: 51.7 mg/dL
Microalb Creat Ratio: 1.5 mg/g (ref 0.0–30.0)
Microalb, Ur: 0.8 mg/dL (ref 0.0–1.9)

## 2021-12-16 LAB — VITAMIN B12: Vitamin B-12: 683 pg/mL (ref 211–911)

## 2021-12-16 LAB — HEMOGLOBIN A1C: Hgb A1c MFr Bld: 6.6 % — ABNORMAL HIGH (ref 4.6–6.5)

## 2021-12-16 LAB — FOLATE: Folate: 15.7 ng/mL (ref >5.9)

## 2021-12-16 NOTE — Progress Notes (Signed)
Subjective:  Patient ID: Lorraine Holt, female    DOB: 08/19/1965  Age: 56 y.o. MRN: [id number]  CC: Hypertension and Diabetes   HPI Lorraine Holt presents for f/up -  She complains of numbness in her upper and lower extremities for greater than a year.  She is active and denies chest pain, shortness of breath, diaphoresis, or edema.  Outpatient Medications Prior to Visit  Medication Sig Dispense Refill   ACCU-CHEK GUIDE test strip USE TO TEST UP TO TWICE DAILY 100 each 11   carvedilol (COREG) 3.125 MG tablet TAKE 1 TABLET BY MOUTH TWICE DAILY WITH A MEAL 180 tablet 0   Dapagliflozin-metFORMIN HCl ER (XIGDUO XR) 10-998 MG TB24 Take 1 tablet by mouth daily. 90 tablet 0   indapamide (LOZOL) 1.25 MG tablet Take 1 tablet by mouth once daily 30 tablet 0   pravastatin (PRAVACHOL) 40 MG tablet Take 1 tablet (40 mg total) by mouth daily. 90 tablet 1   TRUEPLUS LANCETS 30G MISC USE TWICE DAILY 235 each 11   TRULICITY 1.5 TD/3.2KG SOPN INJECT 1.5 MG SYRINGE SUBCUTANEOUSLY ONCE A WEEK 4 mL 0   No facility-administered medications prior to visit.    ROS Review of Systems  Constitutional:  Negative for diaphoresis, fatigue and unexpected weight change.  HENT: Negative.    Eyes: Negative.   Respiratory:  Negative for cough, chest tightness, shortness of breath and wheezing.   Cardiovascular:  Negative for chest pain, palpitations and leg swelling.  Gastrointestinal:  Negative for abdominal pain, constipation, diarrhea, nausea and vomiting.  Endocrine: Negative.   Genitourinary: Negative.  Negative for difficulty urinating.  Musculoskeletal:  Negative for arthralgias, back pain, myalgias and neck pain.  Skin: Negative.   Neurological:  Positive for numbness. Negative for dizziness, weakness and light-headedness.  Hematological:  Negative for adenopathy. Does not bruise/bleed easily.  Psychiatric/Behavioral: Negative.      Objective:  BP 130/86 (BP Location: Right Arm, Patient Position:  Sitting, Cuff Size: Large)   Pulse 84   Temp 98 F (36.7 C) (Oral)   Ht '5\' 11"'$  (1.803 m)   Wt (!) 311 lb (141.1 kg)   LMP 08/08/2014   SpO2 95%   BMI 43.38 kg/m   BP Readings from Last 3 Encounters:  12/16/21 130/86  06/18/21 136/88  01/28/21 138/88    Wt Readings from Last 3 Encounters:  12/16/21 (!) 311 lb (141.1 kg)  06/18/21 (!) 328 lb (148.8 kg)  01/28/21 (!) 325 lb (147.4 kg)    Physical Exam Vitals reviewed.  HENT:     Nose: Nose normal.     Mouth/Throat:     Mouth: Mucous membranes are moist.  Eyes:     General: No scleral icterus.    Conjunctiva/sclera: Conjunctivae normal.  Cardiovascular:     Rate and Rhythm: Normal rate and regular rhythm.     Heart sounds: No murmur heard. Pulmonary:     Effort: Pulmonary effort is normal.     Breath sounds: No stridor. No wheezing, rhonchi or rales.  Abdominal:     General: Abdomen is flat.     Palpations: There is no mass.     Tenderness: There is no abdominal tenderness. There is no guarding.     Hernia: No hernia is present.  Musculoskeletal:        General: Normal range of motion.     Cervical back: Neck supple.     Right lower leg: No edema.     Left lower leg:  No edema.  Lymphadenopathy:     Cervical: No cervical adenopathy.  Skin:    General: Skin is warm and dry.  Neurological:     General: No focal deficit present.     Mental Status: She is alert. Mental status is at baseline.  Psychiatric:        Mood and Affect: Mood normal.        Behavior: Behavior normal.     Lab Results  Component Value Date   WBC 5.1 12/16/2021   HGB 14.4 12/16/2021   HCT 44.8 12/16/2021   PLT 236.0 12/16/2021   GLUCOSE 101 (H) 12/16/2021   CHOL 161 06/18/2021   TRIG 98.0 06/18/2021   HDL 48.70 06/18/2021   LDLCALC 93 06/18/2021   ALT 17 06/18/2021   AST 16 06/18/2021   NA 136 12/16/2021   K 3.7 12/16/2021   CL 102 12/16/2021   CREATININE 0.73 12/16/2021   BUN 14 12/16/2021   CO2 29 12/16/2021   TSH 2.41  06/18/2021   HGBA1C 6.6 (H) 12/16/2021   MICROALBUR 0.8 12/16/2021    CT CHEST LCS NODULE F/U W/O CONTRAST  Result Date: 07/02/2021 CLINICAL DATA:  56 year old female with 32 pack-year history of smoking. Lung cancer screening. EXAM: CT CHEST WITHOUT CONTRAST FOR LUNG CANCER SCREENING NODULE FOLLOW-UP TECHNIQUE: Multidetector CT imaging of the chest was performed following the standard protocol without IV contrast. RADIATION DOSE REDUCTION: This exam was performed according to the departmental dose-optimization program which includes automated exposure control, adjustment of the mA and/or kV according to patient size and/or use of iterative reconstruction technique. COMPARISON:  12/10/2020 FINDINGS: Cardiovascular: The heart size is normal. No substantial pericardial effusion. Coronary artery calcification is evident. Mild atherosclerotic calcification is noted in the wall of the thoracic aorta. Mediastinum/Nodes: No mediastinal lymphadenopathy. No evidence for gross hilar lymphadenopathy although assessment is limited by the lack of intravenous contrast on the current study. The esophagus has normal imaging features. There is no axillary lymphadenopathy. Lungs/Pleura: Centrilobular and paraseptal emphysema evident. Similar appearance of the 7 mm right upper lobe pulmonary nodule (image 59). No new suspicious pulmonary nodule or mass. No focal airspace consolidation. No pleural effusion. Upper Abdomen: The liver shows diffusely decreased attenuation suggesting fat deposition. 2.4 cm gallstone noted towards the neck of the gallbladder. No adrenal nodule or mass. Musculoskeletal: No worrisome lytic or sclerotic osseous abnormality. IMPRESSION: 1. Lung-RADS 2, benign appearance or behavior. 7.4 mm right upper lobe pulmonary nodule measured previously is stable in the interval. Continue annual screening with low-dose chest CT without contrast in 12 months. 2.  Emphysema (ICD10-J43.9) and Aortic Atherosclerosis  (ICD10-170.0) Electronically Signed   By: Misty Stanley M.D.   On: 07/02/2021 11:47   Assessment & Plan:   Lorraine Holt was seen today for hypertension and diabetes.  Diagnoses and all orders for this visit:  Atherosclerosis of aorta ([place])- Risk factor modifications addressed.  Essential hypertension, benign- Her blood pressure is adequately well controlled. -     Basic metabolic panel; Future -     CBC with Differential/Platelet; Future -     CBC with Differential/Platelet -     Basic metabolic panel  Type II diabetes mellitus with manifestations ([place])- Her blood sugar is adequately well controlled. -     Basic metabolic panel; Future -     Hemoglobin A1c; Future -     Microalbumin / creatinine urine ratio; Future -     HM Diabetes Foot Exam -     Microalbumin /  creatinine urine ratio -     Hemoglobin A1c -     Basic metabolic panel  Other iron deficiency anemia- Her H&H are normal. -     CBC with Differential/Platelet; Future -     CBC with Differential/Platelet  Paresthesias- Will check labs to screen for secondary causes. -     Ambulatory referral to Neurology -     Vitamin B1; Future -     Folate; Future -     Vitamin B12; Future -     Vitamin B12 -     Folate -     Vitamin B1   I am having Lorraine Holt maintain her TRUEplus Lancets 30G, Accu-Chek Guide, Xigduo XR, pravastatin, carvedilol, indapamide, and Trulicity.  No orders of the defined types were placed in this encounter.    Follow-up: Return in about 6 months (around 06/18/2022).  Scarlette Calico, MD

## 2021-12-16 NOTE — Patient Instructions (Signed)

## 2021-12-20 ENCOUNTER — Other Ambulatory Visit: Payer: Self-pay | Admitting: Internal Medicine

## 2021-12-20 DIAGNOSIS — E5111 Dry beriberi: Secondary | ICD-10-CM | POA: Insufficient documentation

## 2021-12-20 LAB — VITAMIN B1: Vitamin B1 (Thiamine): 8 nmol/L (ref 8–30)

## 2021-12-20 MED ORDER — VITAMIN B-1 50 MG PO TABS: 50.0000 mg | ORAL_TABLET | Freq: Every day | ORAL | 1 refills | Status: AC

## 2021-12-28 ENCOUNTER — Other Ambulatory Visit: Payer: Self-pay | Admitting: Internal Medicine

## 2021-12-28 DIAGNOSIS — I110 Hypertensive heart disease with heart failure: Secondary | ICD-10-CM

## 2021-12-28 DIAGNOSIS — I10 Essential (primary) hypertension: Secondary | ICD-10-CM

## 2022-01-06 ENCOUNTER — Telehealth: Payer: Self-pay | Admitting: Internal Medicine

## 2022-01-06 NOTE — Telephone Encounter (Signed)
  PT calls today in regards to recent Trulicity RX change. PT states she will not be able to afford the current Trulicity 1.5 MG RX and wanted to know if there was any way to get it switched back to the Trulicity . 5 MG?  CB: [phone]

## 2022-01-08 ENCOUNTER — Other Ambulatory Visit: Payer: Self-pay | Admitting: Internal Medicine

## 2022-01-08 NOTE — Progress Notes (Unsigned)
Lab Results  Component Value Date   WBC 5.1 12/16/2021   HGB 14.4 12/16/2021   HCT 44.8 12/16/2021   PLT 236.0 12/16/2021   GLUCOSE 101 (H) 12/16/2021   CHOL 161 06/18/2021   TRIG 98.0 06/18/2021   HDL 48.70 06/18/2021   LDLCALC 93 06/18/2021   ALT 17 06/18/2021   AST 16 06/18/2021   NA 136 12/16/2021   K 3.7 12/16/2021   CL 102 12/16/2021   CREATININE 0.73 12/16/2021   BUN 14 12/16/2021   CO2 29 12/16/2021   TSH 2.41 06/18/2021   HGBA1C 6.6 (H) 12/16/2021   MICROALBUR 0.8 12/16/2021

## 2022-01-12 NOTE — Telephone Encounter (Signed)
Spoke with Lorraine Holt and she verbalized understanding that her Trulicity has been discontinued. No other questions or concerns.

## 2022-01-22 ENCOUNTER — Other Ambulatory Visit: Payer: Self-pay | Admitting: Internal Medicine

## 2022-01-22 DIAGNOSIS — E118 Type 2 diabetes mellitus with unspecified complications: Secondary | ICD-10-CM

## 2022-01-22 DIAGNOSIS — I110 Hypertensive heart disease with heart failure: Secondary | ICD-10-CM

## 2022-01-22 DIAGNOSIS — I10 Essential (primary) hypertension: Secondary | ICD-10-CM

## 2022-01-31 ENCOUNTER — Other Ambulatory Visit: Payer: Self-pay | Admitting: Internal Medicine

## 2022-01-31 DIAGNOSIS — I10 Essential (primary) hypertension: Secondary | ICD-10-CM

## 2022-01-31 DIAGNOSIS — I110 Hypertensive heart disease with heart failure: Secondary | ICD-10-CM

## 2022-02-16 ENCOUNTER — Encounter: Payer: Self-pay | Admitting: Neurology

## 2022-02-16 ENCOUNTER — Ambulatory Visit: Payer: 59 | Admitting: Neurology

## 2022-02-16 VITALS — BP 155/100 | HR 99 | Ht 71.0 in | Wt 323.0 lb

## 2022-02-16 DIAGNOSIS — R200 Anesthesia of skin: Secondary | ICD-10-CM

## 2022-02-16 DIAGNOSIS — E5111 Dry beriberi: Secondary | ICD-10-CM

## 2022-02-16 DIAGNOSIS — R202 Paresthesia of skin: Secondary | ICD-10-CM | POA: Diagnosis not present

## 2022-02-16 DIAGNOSIS — M7551 Bursitis of right shoulder: Secondary | ICD-10-CM | POA: Insufficient documentation

## 2022-02-16 DIAGNOSIS — G5601 Carpal tunnel syndrome, right upper limb: Secondary | ICD-10-CM | POA: Diagnosis not present

## 2022-02-16 MED ORDER — LAMOTRIGINE 25 MG PO TABS: 25.0000 mg | ORAL_TABLET | Freq: Every day | ORAL | 5 refills | Status: DC

## 2022-02-16 NOTE — Progress Notes (Signed)
[facility]  PATIENT: Lorraine Holt DOB: Jul 18, 1965  REFERRING DOCTOR OR PCP: Scarlette Calico, MD SOURCE: Patient, notes from primary care, imaging and lab reports,  _________________________________   HISTORICAL  CHIEF COMPLAINT:  Chief Complaint  Patient presents with   New Patient (Initial Visit)    Pt alone, rm 2. Presents today for ongoing numbness and tingling. Numbness and tingling started in right foot. She notes in the 2nd and 3rd toe on right foot and heel. She notes this started up around 6 months ago. Rt hand numbness. She has noted in the finger tips    HISTORY OF PRESENT ILLNESS:  I had the pleasure of seeing patient, Lorraine Holt, at [facility] for neurologic consultation regarding her right foot numbness and right hand numbness.  She is a 56 year old woman who has had numbness in the 2nd and 3rd toes on the right for the past 6 to 12 months..   Of note, she has had callus surgery on that foot (adjacent to second toe) several times..   Numbness is burning dysesthetic at times and mostly in those two toe tips.  States she does not have numbness higher up to the foot or at the ankle.  She denies any weakness.    She notes she wobbles some with her gait.   She also has had right hand numbness, tingling and burning, involving all the fingers, probably more in the first 3 fingers that the last 2.   The tingling sometimes will awaken her and be very intense.  It generally gets better when she moves her hand some..   She denies weakness in the hand  She also reports mild weakness at the right shoulder associated with pain.  She has reduced ROM of right shoulder.    Gabapentin 1-2 a day did not help the symptoms in the feet and may have made pain worse.     She has NIDDM.   HgbA1c is 6.6.  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  REVIEW OF SYSTEMS: Constitutional: No fevers, chills, sweats, or change in appetite Eyes: No  visual changes, double vision, eye pain Ear, nose and throat: No hearing loss, ear pain, nasal congestion, sore throat Cardiovascular: No chest pain, palpitations Respiratory:  No shortness of breath at rest or with exertion.   No wheezes GastrointestinaI: No nausea, vomiting, diarrhea, abdominal pain, fecal incontinence Genitourinary:  No dysuria, urinary retention or frequency.  No nocturia. Musculoskeletal:  No neck pain, back pain Integumentary: No rash, pruritus, skin lesions Neurological: as above Psychiatric: No depression at this time.  No anxiety Endocrine: No palpitations, diaphoresis, change in appetite, change in weigh or increased thirst Hematologic/Lymphatic:  No anemia, purpura, petechiae. Allergic/Immunologic: No itchy/runny eyes, nasal congestion, recent allergic reactions, rashes  ALLERGIES: Allergies  Allergen Reactions   Clindamycin/Lincomycin Swelling    Tongue swelled   Penicillins Swelling and Rash   Amlodipine     Constipation, dizziness    HOME MEDICATIONS:  Current Outpatient Medications:    ACCU-CHEK GUIDE test strip, USE TO TEST UP TO TWICE DAILY, Disp: 100 each, Rfl: 11   carvedilol (COREG) 3.125 MG tablet, TAKE 1 TABLET BY MOUTH TWICE DAILY WITH A MEAL, Disp: 180 tablet, Rfl: 0   indapamide (LOZOL) 1.25 MG tablet, Take 1 tablet by mouth once daily, Disp: 30 tablet, Rfl: 0   lamoTRIgine (LAMICTAL) 25 MG tablet, Take 1 tablet (25 mg total) by mouth daily. Take 2 po bid, Disp:  120 tablet, Rfl: 5   pravastatin (PRAVACHOL) 40 MG tablet, Take 1 tablet (40 mg total) by mouth daily., Disp: 90 tablet, Rfl: 1   thiamine (VITAMIN B-1) 50 MG tablet, Take 1 tablet (50 mg total) by mouth daily., Disp: 90 tablet, Rfl: 1   TRUEPLUS LANCETS 30G MISC, USE TWICE DAILY, Disp: 100 each, Rfl: 11   XIGDUO XR 10-998 MG TB24, Take 1 tablet by mouth once daily, Disp: 90 tablet, Rfl: 0  PAST MEDICAL HISTORY: Past Medical History:  Diagnosis Date   Anemia    history age 44'S    Arthritis    Phreesia 02/24/2020   Bronchitis    Carpal tunnel syndrome of right wrist    Diabetes mellitus    Diabetes mellitus without complication ([place])    Phreesia 02/24/2020   Hyperlipidemia    Hypertension    Obesity    Peripheral vascular disease ([place])    undiagnosis, but complains of numbness in legs   Shortness of breath dyspnea    with exertion   Sleep apnea    Phreesia 02/24/2020   Tooth abscess 09/30/14   tooth was extracted    PAST SURGICAL HISTORY: Past Surgical History:  Procedure Laterality Date   ABDOMINAL HYSTERECTOMY  09/2015   BILATERAL SALPINGECTOMY Right 09/17/2014   Procedure: RIGHT SALPINGECTOMY;  Surgeon: Lavonia Drafts, MD;  Location: [facility];  Service: Gynecology;  Laterality: Right;   BREAST BIOPSY Left    COLONOSCOPY  04/2014   DILATION AND CURETTAGE OF UTERUS     ROBOTIC ASSISTED TOTAL HYSTERECTOMY N/A 09/17/2014   Procedure: ROBOTIC ASSISTED TOTAL HYSTERECTOMY;  Surgeon: Lavonia Drafts, MD;  Location: [facility];  Service: Gynecology;  Laterality: N/A;   TOOTH EXTRACTION  09/30/14   TUBAL LIGATION      FAMILY HISTORY: Family History  Problem Relation Age of Onset   Diabetes Maternal Aunt    Breast cancer Maternal Aunt    Hypertension Mother    Diabetes Mother    Breast cancer Mother    Diabetes Maternal Uncle    Breast cancer Maternal Aunt    COPD Neg Hx    Early death Neg Hx    Heart disease Neg Hx    Hyperlipidemia Neg Hx    Kidney disease Neg Hx    Stroke Neg Hx     SOCIAL HISTORY: Social History   Socioeconomic History   Marital status: Married    Spouse name: Not on file   Number of children: Not on file   Years of education: Not on file   Highest education level: Not on file  Occupational History   Occupation: [employer]  Tobacco Use   Smoking status: Every Day    Packs/day: 0.50    Years: 20.00    Total pack years: 10.00    Types: Cigarettes   Smokeless tobacco: Never  Substance and Sexual  Activity   Alcohol use: No   Drug use: No   Sexual activity: Never    Birth control/protection: Surgical  Other Topics Concern   Not on file  Social History Narrative   Not on file   Social Determinants of Health   Financial Resource Strain: Not on file  Food Insecurity: Not on file  Transportation Needs: Not on file  Physical Activity: Not on file  Stress: Not on file  Social Connections: Not on file  Intimate Partner Violence: Not on file       PHYSICAL EXAM  Vitals:   02/16/22 1359  BP: (!) 155/100  Pulse: 99  Weight: (!) 323 lb (146.5 kg)  Height: '5\' 11"'$  (1.803 m)    Body mass index is 45.05 kg/m.   General: The patient is an obese woman in no acute distress  HEENT:  Head is [place]/AT.  Sclera are anicteric.  Funduscopic exam shows normal optic discs and retinal vessels.  Neck: No carotid bruits are noted.  The neck is nontender.  Cardiovascular: The heart has a regular rate and rhythm with a normal S1 and S2. There were no murmurs, gallops or rubs.    Skin: She has a callus adjacent to the second toe on the right.  Musculoskeletal: She has reduced ROM in right shoulder.   Has trouble external rotation due to pain.  There is tenderness over the subacromial bursa on the right  Neurologic Exam  Mental status: The patient is alert and oriented x 3 at the time of the examination. The patient has apparent normal recent and remote memory, with an apparently normal attention span and concentration ability.   Speech is normal.  Cranial nerves: Extraocular movements are full. PFacial stregth and sesation were normal.    . No obvious hearing deficits are noted.  Motor:  Muscle bulk is normal.   Tone is normal. Strength is  5 / 5 in all 4 extremities except 4+/5 right APB.   Sensory: Reduced pinprick over right thenar eminence and digit 1-3.  Reduce pp over digit 2 and inner digit 3 in right foot.   Reduced vibration at toes.    Other:  Tinels sig on right > left.   Phales sign on right.   Tinel's signs over elbows, right > left.   Coordination: Cerebellar testing reveals good finger-nose-finger and heel-to-shin bilaterally.  Gait and station: Station is normal.   Gait is normal. Tandem gait is normal. Romberg is negative.   Reflexes: Deep tendon reflexes are symmetric and normal bilaterally.   Plantar responses are flexor.    DIAGNOSTIC DATA (LABS, IMAGING, TESTING) - I reviewed patient records, labs, notes, testing and imaging myself where available.  Lab Results  Component Value Date   WBC 5.1 12/16/2021   HGB 14.4 12/16/2021   HCT 44.8 12/16/2021   MCV 90.1 12/16/2021   PLT 236.0 12/16/2021      Component Value Date/Time   NA 136 12/16/2021 1104   NA 141 02/29/2020 1115   K 3.7 12/16/2021 1104   CL 102 12/16/2021 1104   CO2 29 12/16/2021 1104   GLUCOSE 101 (H) 12/16/2021 1104   BUN 14 12/16/2021 1104   BUN 14 02/29/2020 1115   CREATININE 0.73 12/16/2021 1104   CALCIUM 9.6 12/16/2021 1104   PROT 7.4 06/18/2021 1051   PROT 7.2 02/29/2020 1115   ALBUMIN 3.9 06/18/2021 1051   ALBUMIN 4.1 02/29/2020 1115   AST 16 06/18/2021 1051   ALT 17 06/18/2021 1051   ALKPHOS 118 (H) 06/18/2021 1051   BILITOT 0.6 06/18/2021 1051   BILITOT 0.2 02/29/2020 1115   GFRNONAA 84 02/29/2020 1115   GFRAA 97 02/29/2020 1115   Lab Results  Component Value Date   CHOL 161 06/18/2021   HDL 48.70 06/18/2021   LDLCALC 93 06/18/2021   TRIG 98.0 06/18/2021   CHOLHDL 3 06/18/2021   Lab Results  Component Value Date   HGBA1C 6.6 (H) 12/16/2021   Lab Results  Component Value Date   VITAMINB12 683 12/16/2021   Lab Results  Component Value Date   TSH 2.41 06/18/2021  ASSESSMENT AND PLAN  Numbness and tingling  Carpal tunnel syndrome of right wrist  Thiamine deficiency neuropathy  Subacromial bursitis of right shoulder joint   In summary, Lorraine Holt is a 56 year old woman reporting numbness and pain in the second (+ / - third) toe on  the right and numbness and pain in the right hand and wrist.  The pain in the right foot could be due to a digital neuropathy.  The distribution is too small to be a major nerve.  Symptoms in the hand on the right likely carpal tunnel, possibly with superimposed ulnar neuropathy.  Additionally she is tender over the right subacromial bursa and has reduced range of motion of the right shoulder.  To help with the dysesthetic pain I will start lamotrigine and titrate up to 50 mg p.o. twice daily.  This dose can be increased further if well-tolerated. Using sterile technique, the right subacromial bursa was injected with 40 mg of Depo-Medrol in 3 cc Marcaine using sterile technique.  She tolerated the procedure well and there were no complications.  If symptoms persist to the right shoulder recommend referral to orthopedics for evaluation of rotator cuff. Symptoms of the right wrist are likely carpal tunnel syndrome and there could also be a mild ulnar neuropathy.  We will check NCV/EMG of the right arm to help guide further therapy.  In the interim, she he is advised to wear a brace at night Return in 3 to 4 months or sooner if there are new or worsening neurologic symptoms.  Thank you for asking me to see Lorraine Holt.  Please let me know if I may be of further assistance with her or other patients in the future.      Ocean Schildt A. Felecia Shelling, MD, [facility] 02/03/5620, 3:08 PM Certified in Neurology, Clinical Neurophysiology, Sleep Medicine and Neuroimaging  [facility] [address] [place], [place] [address] [phone]

## 2022-02-16 NOTE — Patient Instructions (Signed)
The pharmacy has the prescription of lamotrigine 25 mg. Please take it as follows: For 5 days take 1 pill once a day. The next 5 days, take 1 pill twice a day. The next 5 days, take 1 pill in the morning and 2 at bedtime or evening. The n take 2 pills twice a day.  Most people tolerate lamotrigine very well. Some will get a rash.  If you do get a significant rash stop the medicine immediately and let us know.

## 2022-02-17 ENCOUNTER — Telehealth: Payer: Self-pay | Admitting: *Deleted

## 2022-02-17 MED ORDER — LAMOTRIGINE 25 MG PO TABS: ORAL_TABLET | ORAL | 5 refills | Status: DC

## 2022-02-17 NOTE — Telephone Encounter (Signed)
Took call from phone staff and spoke w/ pharmacy. Clarified directions again. Nothing further needed.

## 2022-02-17 NOTE — Telephone Encounter (Signed)
Took call from phone staff/Jael and spoke w/ [place]. They received rx lamotrigine '25mg'$  tab w/o titration instructions. I provided VO for the following per MD note: "For 5 days take 1 pill once a day. The next 5 days, take 1 pill twice a day. The next 5 days, take 1 pill in the morning and 2 at bedtime or evening. Then take 2 pills twice a day."  They verbalized understanding and will updated rx.

## 2022-02-18 ENCOUNTER — Telehealth: Payer: Self-pay | Admitting: Neurology

## 2022-02-18 NOTE — Telephone Encounter (Signed)
Fax sent to [facility] for scheduling of NCV/EMG

## 2022-03-10 DIAGNOSIS — G5601 Carpal tunnel syndrome, right upper limb: Secondary | ICD-10-CM | POA: Diagnosis not present

## 2022-03-16 ENCOUNTER — Telehealth: Payer: Self-pay | Admitting: Neurology

## 2022-03-16 ENCOUNTER — Encounter: Payer: Self-pay | Admitting: Neurology

## 2022-03-16 NOTE — Telephone Encounter (Signed)
Reviewed with Dr Brett Fairy and she states the report does indicate that there is evidence of carpal tunnel on the right wrist. No active nerve injury. She would recommend that the patient use soft wrist splint if she hasn't already and if she has than can place a referral to hand surgeon.

## 2022-03-16 NOTE — Telephone Encounter (Signed)
Nerve conduction study has been completed and the results have been received from [facility].

## 2022-03-31 ENCOUNTER — Other Ambulatory Visit: Payer: Self-pay | Admitting: Internal Medicine

## 2022-03-31 DIAGNOSIS — E785 Hyperlipidemia, unspecified: Secondary | ICD-10-CM

## 2022-04-30 ENCOUNTER — Other Ambulatory Visit: Payer: Self-pay | Admitting: Internal Medicine

## 2022-04-30 DIAGNOSIS — E118 Type 2 diabetes mellitus with unspecified complications: Secondary | ICD-10-CM

## 2022-05-09 ENCOUNTER — Other Ambulatory Visit: Payer: Self-pay | Admitting: Internal Medicine

## 2022-05-09 DIAGNOSIS — I110 Hypertensive heart disease with heart failure: Secondary | ICD-10-CM

## 2022-05-09 DIAGNOSIS — I10 Essential (primary) hypertension: Secondary | ICD-10-CM

## 2022-05-12 ENCOUNTER — Encounter: Payer: Self-pay | Admitting: Neurology

## 2022-05-12 ENCOUNTER — Ambulatory Visit (INDEPENDENT_AMBULATORY_CARE_PROVIDER_SITE_OTHER): Payer: 59 | Admitting: Neurology

## 2022-05-12 VITALS — BP 137/88 | HR 90 | Ht 71.0 in | Wt 321.5 lb

## 2022-05-12 DIAGNOSIS — G5601 Carpal tunnel syndrome, right upper limb: Secondary | ICD-10-CM

## 2022-05-12 DIAGNOSIS — M7551 Bursitis of right shoulder: Secondary | ICD-10-CM

## 2022-05-12 DIAGNOSIS — R413 Other amnesia: Secondary | ICD-10-CM | POA: Diagnosis not present

## 2022-05-12 DIAGNOSIS — G4733 Obstructive sleep apnea (adult) (pediatric): Secondary | ICD-10-CM | POA: Diagnosis not present

## 2022-05-12 NOTE — Progress Notes (Signed)
[facility]  PATIENT: Lorraine Holt DOB: 08-09-1965  REFERRING DOCTOR OR PCP: Scarlette Calico, MD SOURCE: Patient, notes from primary care, imaging and lab reports,  _________________________________   HISTORICAL  CHIEF COMPLAINT:  Chief Complaint  Patient presents with   Follow-up    Rm 11, alone. Last seen 02/16/22. States husband is telling her she is forgetting things. Wants MRI done. Does not wear wrist splints for CTS. Does not want surgery for now. Able to lift up right arm now. Cannot reach behind her back.     HISTORY OF PRESENT ILLNESS:  I had the pleasure of seeing patient, Lorraine Holt, at [facility] for neurologic consultation regarding her right foot numbness and right hand numbness.  UPDATE 05/12/2022 At the last visit, we did a subacromial bursa injection and the shoulder pain improved.  Due to right hand pain consistent with carpal tunnel syndrome, an NCV/EMG was ordered.     She has moderate right CTS on NCV but feels better than last visit.   She still has some tingling in the hand.    Her husband feels she is having more memory issues.  She feels her memory is fine but she does not focus as well.  She has OSA (AHI = 10.8 on HST 08/18/2016 and SaO2 nadir was 67%and tried CPAP but had trouble tolerating it.    EPWORTH SLEEPINESS SCALE  On a scale of 0 - 3 what is the chance of dozing:  Sitting and Reading:   1 Watching TV:    3 Sitting inactive in a public place: 2 Passenger in car for one hour: 0 Lying down to rest in the afternoon: 3 Sitting and talking to someone: 0 Sitting quietly after lunch:  1 In a car, stopped in traffic:  0  Total (out of 24):     10/24  mild EDS   She has NIDDM.   HgbA1c is 6.6.  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  REVIEW OF SYSTEMS: Constitutional: No fevers, chills, sweats, or change in appetite Eyes: No visual changes, double vision, eye pain Ear, nose and throat:  No hearing loss, ear pain, nasal congestion, sore throat Cardiovascular: No chest pain, palpitations Respiratory:  No shortness of breath at rest or with exertion.   No wheezes GastrointestinaI: No nausea, vomiting, diarrhea, abdominal pain, fecal incontinence Genitourinary:  No dysuria, urinary retention or frequency.  No nocturia. Musculoskeletal:  No neck pain, back pain Integumentary: No rash, pruritus, skin lesions Neurological: as above Psychiatric: No depression at this time.  No anxiety Endocrine: No palpitations, diaphoresis, change in appetite, change in weigh or increased thirst Hematologic/Lymphatic:  No anemia, purpura, petechiae. Allergic/Immunologic: No itchy/runny eyes, nasal congestion, recent allergic reactions, rashes  ALLERGIES: Allergies  Allergen Reactions   Clindamycin/Lincomycin Swelling    Tongue swelled   Penicillins Swelling and Rash   Amlodipine     Constipation, dizziness    HOME MEDICATIONS:  Current Outpatient Medications:    ACCU-CHEK GUIDE test strip, USE TO TEST UP TO TWICE DAILY, Disp: 100 each, Rfl: 11   carvedilol (COREG) 3.125 MG tablet, TAKE 1 TABLET BY MOUTH TWICE DAILY WITH A MEAL, Disp: 180 tablet, Rfl: 0   docusate sodium (COLACE) 50 MG capsule, Take 50 mg by mouth daily., Disp: , Rfl:    indapamide (LOZOL) 1.25 MG tablet, Take 1 tablet by mouth once daily, Disp: 90 tablet, Rfl: 0   lamoTRIgine (LAMICTAL) 25 MG tablet, For the first 5  days take 1 pill once a day. The next 5 days, take 1 pill twice a day. The next 5 days, take 1 pill in the morning and 2 at bedtime or evening. The n take 2 pills twice a day., Disp: 120 tablet, Rfl: 5   pravastatin (PRAVACHOL) 40 MG tablet, Take 1 tablet by mouth once daily, Disp: 90 tablet, Rfl: 0   thiamine (VITAMIN B-1) 50 MG tablet, Take 1 tablet (50 mg total) by mouth daily., Disp: 90 tablet, Rfl: 1   TRUEPLUS LANCETS 30G MISC, USE TWICE DAILY, Disp: 100 each, Rfl: 11   XIGDUO XR 10-998 MG TB24, Take 1  tablet by mouth once daily, Disp: 90 tablet, Rfl: 0  PAST MEDICAL HISTORY: Past Medical History:  Diagnosis Date   Anemia    history age 21'S   Arthritis    [employer]reesia 02/24/2020   Bronchitis    Carpal tunnel syndrome of right wrist    Diabetes mellitus    Diabetes mellitus without complication ([place])    Phreesia 02/24/2020   Hyperlipidemia    Hypertension    Obesity    Peripheral vascular disease ([place])    undiagnosis, but complains of numbness in legs   Shortness of breath dyspnea    with exertion   Sleep apnea    Phreesia 02/24/2020   Tooth abscess 09/30/14   tooth was extracted    PAST SURGICAL HISTORY: Past Surgical History:  Procedure Laterality Date   ABDOMINAL HYSTERECTOMY  09/2015   BILATERAL SALPINGECTOMY Right 09/17/2014   Procedure: RIGHT SALPINGECTOMY;  Surgeon: Lavonia Drafts, MD;  Location: [facility];  Service: Gynecology;  Laterality: Right;   BREAST BIOPSY Left    COLONOSCOPY  04/2014   DILATION AND CURETTAGE OF UTERUS     ROBOTIC ASSISTED TOTAL HYSTERECTOMY N/A 09/17/2014   Procedure: ROBOTIC ASSISTED TOTAL HYSTERECTOMY;  Surgeon: Lavonia Drafts, MD;  Location: [facility];  Service: Gynecology;  Laterality: N/A;   TOOTH EXTRACTION  09/30/14   TUBAL LIGATION      FAMILY HISTORY: Family History  Problem Relation Age of Onset   Diabetes Maternal Aunt    Breast cancer Maternal Aunt    Hypertension Mother    Diabetes Mother    Breast cancer Mother    Diabetes Maternal Uncle    Breast cancer Maternal Aunt    COPD Neg Hx    Early death Neg Hx    Heart disease Neg Hx    Hyperlipidemia Neg Hx    Kidney disease Neg Hx    Stroke Neg Hx     SOCIAL HISTORY: Social History   Socioeconomic History   Marital status: Married    Spouse name: Not on file   Number of children: Not on file   Years of education: Not on file   Highest education level: Not on file  Occupational History   Occupation: [employer]  Tobacco Use   Smoking status:  Every Day    Packs/day: 0.50    Years: 20.00    Total pack years: 10.00    Types: Cigarettes   Smokeless tobacco: Never  Substance and Sexual Activity   Alcohol use: No   Drug use: No   Sexual activity: Never    Birth control/protection: Surgical  Other Topics Concern   Not on file  Social History Narrative   Not on file   Social Determinants of Health   Financial Resource Strain: Not on file  Food Insecurity: Not on file  Transportation Needs: Not on  file  Physical Activity: Not on file  Stress: Not on file  Social Connections: Not on file  Intimate Partner Violence: Not on file       PHYSICAL EXAM  Vitals:   05/12/22 1120  BP: 137/88  Pulse: 90  Weight: (!) 321 lb 8 oz (145.8 kg)  Height: '5\' 11"'$  (1.803 m)    Body mass index is 44.84 kg/m.   General: The patient is an obese woman in no acute distress  HEENT:  Head is /AT.  Sclera are anicteric.    Neck: No carotid bruits are noted.  The neck is nontender.  Skin: She has a callus adjacent to the second toe on the right.  Musculoskeletal: She has reduced ROM in right shoulder.   She has mildly reduced range of motion in the right shoulder.  The subacromial bursa pain is improved compared to the last visit.  Neurologic Exam  Mental status: The patient is alert and oriented x 3 at the time of the examination. The patient has apparent normal recent and remote memory, with an apparently normal attention span and concentration ability.   Speech is normal.  Cranial nerves: Extraocular movements are full. PFacial stregth and sesation were normal.    . No obvious hearing deficits are noted.  Motor:  Muscle bulk is normal.   Tone is normal. Strength is  5 / 5 in all 4 extremities except 4+/5 right APB.   Sensory: Today, sensation was normal in the hands  Other:  Tinels sign at the right wrist.   Coordination: Cerebellar testing reveals good finger-nose-finger and heel-to-shin bilaterally.  Gait and station:  Station is normal.   Gait is normal. Tandem gait is normal. Romberg is negative.   Reflexes: Deep tendon reflexes are symmetric and normal bilaterally.      DIAGNOSTIC DATA (LABS, IMAGING, TESTING) - I reviewed patient records, labs, notes, testing and imaging myself where available.  Lab Results  Component Value Date   WBC 5.1 12/16/2021   HGB 14.4 12/16/2021   HCT 44.8 12/16/2021   MCV 90.1 12/16/2021   PLT 236.0 12/16/2021      Component Value Date/Time   NA 136 12/16/2021 1104   NA 141 02/29/2020 1115   K 3.7 12/16/2021 1104   CL 102 12/16/2021 1104   CO2 29 12/16/2021 1104   GLUCOSE 101 (H) 12/16/2021 1104   BUN 14 12/16/2021 1104   BUN 14 02/29/2020 1115   CREATININE 0.73 12/16/2021 1104   CALCIUM 9.6 12/16/2021 1104   PROT 7.4 06/18/2021 1051   PROT 7.2 02/29/2020 1115   ALBUMIN 3.9 06/18/2021 1051   ALBUMIN 4.1 02/29/2020 1115   AST 16 06/18/2021 1051   ALT 17 06/18/2021 1051   ALKPHOS 118 (H) 06/18/2021 1051   BILITOT 0.6 06/18/2021 1051   BILITOT 0.2 02/29/2020 1115   GFRNONAA 84 02/29/2020 1115   GFRAA 97 02/29/2020 1115   Lab Results  Component Value Date   CHOL 161 06/18/2021   HDL 48.70 06/18/2021   LDLCALC 93 06/18/2021   TRIG 98.0 06/18/2021   CHOLHDL 3 06/18/2021   Lab Results  Component Value Date   HGBA1C 6.6 (H) 12/16/2021   Lab Results  Component Value Date   [id number] 867 12/16/2021   Lab Results  Component Value Date   TSH 2.41 06/18/2021       ASSESSMENT AND PLAN  OSA (obstructive sleep apnea) - Plan: Home sleep test  Carpal tunnel syndrome of right wrist  Memory loss  Subacromial bursitis of right shoulder joint   She has moderate carpal tunnel syndrome on the right.  Symptoms are better than they were at the last visit and she would like to hold off on surgery.  I recommended that she obtain a wrist splint to wear at night. Shoulder pain is doing better.  We discussed that if it worsens again I would recommend she  see orthopedics Although her husband tells her that her memory is not as good as it used to be, she feels memory is fine and I do not pick up on any issues during our interaction today.  Most likely she has reduced focus/attention, probably due to her known obstructive sleep apnea and nocturnal hypoxemia.  She was unable to tolerate CPAP in the past.  I will request a home sleep study.  If OSA has worsened I would strongly urged her to reconsider CPAP.  At the least, she might benefit from O2 at night Return in 6 months or sooner if there are new or worsening neurologic symptoms.     Braedon Sjogren A. Felecia Shelling, MD, [facility] 22/09/8248, 03:70 PM Certified in Neurology, Clinical Neurophysiology, Sleep Medicine and Neuroimaging  [facility] [address], [place],  [address] [phone]

## 2022-06-03 LAB — HM DIABETES EYE EXAM

## 2022-06-09 ENCOUNTER — Ambulatory Visit: Payer: 59 | Admitting: Neurology

## 2022-06-17 ENCOUNTER — Ambulatory Visit (INDEPENDENT_AMBULATORY_CARE_PROVIDER_SITE_OTHER): Payer: 59 | Admitting: Internal Medicine

## 2022-06-17 ENCOUNTER — Encounter: Payer: Self-pay | Admitting: Internal Medicine

## 2022-06-17 VITALS — BP 124/86 | HR 100 | Temp 98.2°F | Ht 71.0 in | Wt 323.0 lb

## 2022-06-17 DIAGNOSIS — Z23 Encounter for immunization: Secondary | ICD-10-CM | POA: Diagnosis not present

## 2022-06-17 DIAGNOSIS — I10 Essential (primary) hypertension: Secondary | ICD-10-CM

## 2022-06-17 DIAGNOSIS — E785 Hyperlipidemia, unspecified: Secondary | ICD-10-CM

## 2022-06-17 DIAGNOSIS — D508 Other iron deficiency anemias: Secondary | ICD-10-CM

## 2022-06-17 DIAGNOSIS — E118 Type 2 diabetes mellitus with unspecified complications: Secondary | ICD-10-CM | POA: Diagnosis not present

## 2022-06-17 LAB — HEPATIC FUNCTION PANEL
ALT: 17 U/L (ref 0–35)
AST: 16 U/L (ref 0–37)
Albumin: 3.9 g/dL (ref 3.5–5.2)
Alkaline Phosphatase: 129 U/L — ABNORMAL HIGH (ref 39–117)
Bilirubin, Direct: 0.1 mg/dL (ref 0.0–0.3)
Total Bilirubin: 0.4 mg/dL (ref 0.2–1.2)
Total Protein: 7.5 g/dL (ref 6.0–8.3)

## 2022-06-17 LAB — BASIC METABOLIC PANEL
BUN: 12 mg/dL (ref 6–23)
CO2: 30 mEq/L (ref 19–32)
Calcium: 9.7 mg/dL (ref 8.4–10.5)
Chloride: 103 mEq/L (ref 96–112)
Creatinine, Ser: 0.8 mg/dL (ref 0.40–1.20)
GFR: 80.93 mL/min (ref >60.00)
Glucose, Bld: 131 mg/dL — ABNORMAL HIGH (ref 70–99)
Potassium: 3.9 mEq/L (ref 3.5–5.1)
Sodium: 142 mEq/L (ref 135–145)

## 2022-06-17 LAB — CBC WITH DIFFERENTIAL/PLATELET
Basophils Absolute: 0 10*3/uL (ref 0.0–0.1)
Basophils Relative: 0.5 % (ref 0.0–3.0)
Eosinophils Absolute: 0.2 10*3/uL (ref 0.0–0.7)
Eosinophils Relative: 4 % (ref 0.0–5.0)
HCT: 46 % (ref 36.0–46.0)
Hemoglobin: 15 g/dL (ref 12.0–15.0)
Lymphocytes Relative: 32 % (ref 12.0–46.0)
Lymphs Abs: 1.7 10*3/uL (ref 0.7–4.0)
MCHC: 32.6 g/dL (ref 30.0–36.0)
MCV: 89.3 fl (ref 78.0–100.0)
Monocytes Absolute: 0.4 10*3/uL (ref 0.1–1.0)
Monocytes Relative: 7.4 % (ref 3.0–12.0)
Neutro Abs: 2.9 10*3/uL (ref 1.4–7.7)
Neutrophils Relative %: 56.1 % (ref 43.0–77.0)
Platelets: 245 10*3/uL (ref 150.0–400.0)
RBC: 5.16 Mil/uL — ABNORMAL HIGH (ref 3.87–5.11)
RDW: 14.7 % (ref 11.5–15.5)
WBC: 5.3 10*3/uL (ref 4.0–10.5)

## 2022-06-17 LAB — LIPID PANEL
Cholesterol: 182 mg/dL (ref 0–200)
HDL: 51 mg/dL (ref >39.00)
LDL Cholesterol: 108 mg/dL — ABNORMAL HIGH (ref 0–99)
NonHDL: 130.6
Total CHOL/HDL Ratio: 4
Triglycerides: 112 mg/dL (ref 0.0–149.0)
VLDL: 22.4 mg/dL (ref 0.0–40.0)

## 2022-06-17 LAB — HEMOGLOBIN A1C: Hgb A1c MFr Bld: 7.5 % — ABNORMAL HIGH (ref 4.6–6.5)

## 2022-06-17 MED ORDER — TRULICITY 0.75 MG/0.5ML ~~LOC~~ SOAJ: 0.7500 mg | SUBCUTANEOUS | 0 refills | Status: DC

## 2022-06-17 NOTE — Patient Instructions (Signed)

## 2022-06-17 NOTE — Progress Notes (Signed)
Subjective:  Patient ID: Lorraine Holt, female    DOB: 01-09-1966  Age: 57 y.o. MRN: [id number]  CC: Hypertension and Diabetes   HPI [name] presents for f/up ----  She is active and denies DOE, CP, SOB, edema.  Outpatient Medications Prior to Visit  Medication Sig Dispense Refill   carvedilol (COREG) 3.125 MG tablet TAKE 1 TABLET BY MOUTH TWICE DAILY WITH A MEAL 180 tablet 0   docusate sodium (COLACE) 50 MG capsule Take 50 mg by mouth daily.     indapamide (LOZOL) 1.25 MG tablet Take 1 tablet by mouth once daily 90 tablet 0   lamoTRIgine (LAMICTAL) 25 MG tablet For the first 5 days take 1 pill once a day. The next 5 days, take 1 pill twice a day. The next 5 days, take 1 pill in the morning and 2 at bedtime or evening. The n take 2 pills twice a day. 120 tablet 5   pravastatin (PRAVACHOL) 40 MG tablet Take 1 tablet by mouth once daily 90 tablet 0   thiamine (VITAMIN B-1) 50 MG tablet Take 1 tablet (50 mg total) by mouth daily. 90 tablet 1   TRUEPLUS LANCETS 30G MISC USE TWICE DAILY 100 each 11   XIGDUO XR 10-998 MG TB24 Take 1 tablet by mouth once daily 90 tablet 0   ACCU-CHEK GUIDE test strip USE TO TEST UP TO TWICE DAILY 100 each 11   No facility-administered medications prior to visit.    ROS Review of Systems  Constitutional: Negative.  Negative for diaphoresis and fatigue.  HENT: Negative.    Eyes: Negative.   Respiratory:  Negative for cough, chest tightness, shortness of breath and wheezing.   Cardiovascular:  Negative for chest pain, palpitations and leg swelling.  Gastrointestinal:  Negative for abdominal pain, constipation, diarrhea, nausea and vomiting.  Endocrine: Negative.   Genitourinary: Negative.   Musculoskeletal:  Positive for arthralgias.  Skin: Negative.   Neurological:  Negative for dizziness, weakness and light-headedness.  Hematological:  Negative for adenopathy. Does not bruise/bleed easily.  Psychiatric/Behavioral: Negative.       Objective:  BP 124/86 (BP Location: Left Arm, Patient Position: Sitting, Cuff Size: Large)   Pulse 100   Temp 98.2 F (36.8 C) (Oral)   Ht '5\' 11"'$  (1.803 m)   Wt (!) 323 lb (146.5 kg)   LMP 08/08/2014   SpO2 96%   BMI 45.05 kg/m   BP Readings from Last 3 Encounters:  06/17/22 124/86  05/12/22 137/88  02/16/22 (!) 155/100    Wt Readings from Last 3 Encounters:  06/17/22 (!) 323 lb (146.5 kg)  05/12/22 (!) 321 lb 8 oz (145.8 kg)  02/16/22 (!) 323 lb (146.5 kg)    Physical Exam Vitals reviewed.  HENT:     Mouth/Throat:     Mouth: Mucous membranes are moist.  Eyes:     General: No scleral icterus.    Conjunctiva/sclera: Conjunctivae normal.  Cardiovascular:     Rate and Rhythm: Normal rate and regular rhythm.     Heart sounds: Normal heart sounds, S1 normal and S2 normal. No murmur heard.    Comments: EKG- NSR, 82 bpm Low voltage c/w body No LVH, Q waves, or ST/T wave changes Pulmonary:     Effort: Pulmonary effort is normal.     Breath sounds: No stridor. No wheezing, rhonchi or rales.  Abdominal:     General: Abdomen is protuberant. Bowel sounds are normal.     Palpations: There is  no hepatomegaly, splenomegaly or mass.     Tenderness: There is no abdominal tenderness.  Musculoskeletal:        General: Deformity (djd) present.     Cervical back: Neck supple.     Right lower leg: No edema.     Left lower leg: No edema.  Skin:    General: Skin is warm and dry.  Neurological:     General: No focal deficit present.     Mental Status: She is alert. Mental status is at baseline.  Psychiatric:        Mood and Affect: Mood normal.        Behavior: Behavior normal.     Lab Results  Component Value Date   WBC 5.3 06/17/2022   HGB 15.0 06/17/2022   HCT 46.0 06/17/2022   PLT 245.0 06/17/2022   GLUCOSE 131 (H) 06/17/2022   CHOL 182 06/17/2022   TRIG 112.0 06/17/2022   HDL 51.00 06/17/2022   LDLCALC 108 (H) 06/17/2022   ALT 17 06/17/2022   AST 16  06/17/2022   NA 142 06/17/2022   K 3.9 06/17/2022   CL 103 06/17/2022   CREATININE 0.80 06/17/2022   BUN 12 06/17/2022   CO2 30 06/17/2022   TSH 2.41 06/18/2021   HGBA1C 7.5 (H) 06/17/2022   MICROALBUR 0.8 12/16/2021    CT CHEST LCS NODULE F/U W/O CONTRAST  Result Date: 07/02/2021 CLINICAL DATA:  57 year old female with 32 pack-year history of smoking. Lung cancer screening. EXAM: CT CHEST WITHOUT CONTRAST FOR LUNG CANCER SCREENING NODULE FOLLOW-UP TECHNIQUE: Multidetector CT imaging of the chest was performed following the standard protocol without IV contrast. RADIATION DOSE REDUCTION: This exam was performed according to the departmental dose-optimization program which includes automated exposure control, adjustment of the mA and/or kV according to patient size and/or use of iterative reconstruction technique. COMPARISON:  12/10/2020 FINDINGS: Cardiovascular: The heart size is normal. No substantial pericardial effusion. Coronary artery calcification is evident. Mild atherosclerotic calcification is noted in the wall of the thoracic aorta. Mediastinum/Nodes: No mediastinal lymphadenopathy. No evidence for gross hilar lymphadenopathy although assessment is limited by the lack of intravenous contrast on the current study. The esophagus has normal imaging features. There is no axillary lymphadenopathy. Lungs/Pleura: Centrilobular and paraseptal emphysema evident. Similar appearance of the 7 mm right upper lobe pulmonary nodule (image 59). No new suspicious pulmonary nodule or mass. No focal airspace consolidation. No pleural effusion. Upper Abdomen: The liver shows diffusely decreased attenuation suggesting fat deposition. 2.4 cm gallstone noted towards the neck of the gallbladder. No adrenal nodule or mass. Musculoskeletal: No worrisome lytic or sclerotic osseous abnormality. IMPRESSION: 1. Lung-RADS 2, benign appearance or behavior. 7.4 mm right upper lobe pulmonary nodule measured previously is  stable in the interval. Continue annual screening with low-dose chest CT without contrast in 12 months. 2.  Emphysema (ICD10-J43.9) and Aortic Atherosclerosis (ICD10-170.0) Electronically Signed   By: Misty Stanley M.D.   On: 07/02/2021 11:47   Assessment & Plan:   Shae was seen today for hypertension and diabetes.  Diagnoses and all orders for this visit:  Essential hypertension, benign- EKG is negative for LVH. Her BP is well controlled. -     Basic metabolic panel; Future -     CBC with Differential/Platelet; Future -     Hepatic function panel; Future -     EKG 12-Lead -     Basic metabolic panel -     CBC with Differential/Platelet -     Hepatic  function panel  Type II diabetes mellitus with manifestations ([place])- A1C is too high. Will add a GLP-1 agonist. -     Hemoglobin A1c; Future -     Hemoglobin A1c -     Dulaglutide (TRULICITY) 7.65 YY/5.0PT SOPN; Inject 0.75 mg into the skin once a week.  Hyperlipidemia with target LDL less than 100- LDL goal achieved. Doing well on the statin  -     Lipid panel; Future -     Hepatic function panel; Future -     Lipid panel -     Hepatic function panel  Other iron deficiency anemia- H/H are normal now. -     CBC with Differential/Platelet; Future -     CBC with Differential/Platelet  Other orders -     Flu Vaccine QUAD 6+ mos PF IM (Fluarix Quad PF)   I am having Lorraine Holt start on Trulicity. I am also having her maintain her TRUEplus Lancets 30G, thiamine, lamoTRIgine, pravastatin, Xigduo XR, indapamide, carvedilol, and docusate sodium.  Meds ordered this encounter  Medications   Dulaglutide (TRULICITY) 4.65 KC/1.2XN SOPN    Sig: Inject 0.75 mg into the skin once a week.    Dispense:  2 mL    Refill:  0     Follow-up: Return in about 6 months (around 12/16/2022).  Scarlette Calico, MD

## 2022-06-18 ENCOUNTER — Telehealth: Payer: Self-pay | Admitting: Internal Medicine

## 2022-06-18 MED ORDER — BLOOD GLUCOSE METER KIT: PACK | 0 refills | Status: DC

## 2022-06-18 NOTE — Telephone Encounter (Signed)
Caller & Relationship to patient: Self  Call back number: [phone]   Date of last office visit: 1.11.24  Date of next office visit: 7.11.24  Medication(s) to be refilled:  Genetic test strips, lancets, and glucose monitor  Preferred Pharmacy:   [facility]   Phone: [phone]  Fax: [phone]

## 2022-06-24 ENCOUNTER — Telehealth: Payer: Self-pay

## 2022-06-24 NOTE — Telephone Encounter (Signed)
LVM for pt to call back to schedule sleep study.  

## 2022-07-01 ENCOUNTER — Ambulatory Visit
Admission: RE | Admit: 2022-07-01 | Discharge: 2022-07-01 | Disposition: A | Discharge status: Home / Self Care | Payer: 59 | Source: Ambulatory Visit | Attending: Acute Care | Admitting: Acute Care

## 2022-07-01 DIAGNOSIS — F17210 Nicotine dependence, cigarettes, uncomplicated: Secondary | ICD-10-CM

## 2022-07-01 DIAGNOSIS — R69 Illness, unspecified: Secondary | ICD-10-CM | POA: Diagnosis not present

## 2022-07-03 ENCOUNTER — Other Ambulatory Visit: Payer: Self-pay | Admitting: Internal Medicine

## 2022-07-03 DIAGNOSIS — E785 Hyperlipidemia, unspecified: Secondary | ICD-10-CM

## 2022-07-12 ENCOUNTER — Telehealth: Payer: Self-pay | Admitting: Neurology

## 2022-07-12 NOTE — Telephone Encounter (Signed)
HST- [employer] No auth required ref# [id number]   Patient is scheduled at [facility] for 07/20/22 at 10:30 AM.  Mailed packet to the patient.

## 2022-07-16 ENCOUNTER — Other Ambulatory Visit: Payer: Self-pay | Admitting: Internal Medicine

## 2022-07-16 ENCOUNTER — Telehealth: Payer: Self-pay | Admitting: Internal Medicine

## 2022-07-16 ENCOUNTER — Other Ambulatory Visit: Payer: Self-pay | Admitting: Acute Care

## 2022-07-16 DIAGNOSIS — Z122 Encounter for screening for malignant neoplasm of respiratory organs: Secondary | ICD-10-CM

## 2022-07-16 DIAGNOSIS — F17210 Nicotine dependence, cigarettes, uncomplicated: Secondary | ICD-10-CM

## 2022-07-16 DIAGNOSIS — E118 Type 2 diabetes mellitus with unspecified complications: Secondary | ICD-10-CM

## 2022-07-16 DIAGNOSIS — Z87891 Personal history of nicotine dependence: Secondary | ICD-10-CM

## 2022-07-16 NOTE — Telephone Encounter (Signed)
Caller & Relationship to patient: Self  Call back number: [phone]   Date of last office visit: 1.11.24  Date of next office visit: 7.11.24  Medication(s) to be refilled:  TRULICITY 1.5 0000000 SOPN   Preferred Pharmacy:   [facility]   Phone: [phone]  Fax: [phone]    Pt would like to know if she can have refills on this medication.

## 2022-07-18 ENCOUNTER — Other Ambulatory Visit: Payer: Self-pay | Admitting: Internal Medicine

## 2022-07-19 ENCOUNTER — Other Ambulatory Visit (HOSPITAL_COMMUNITY): Payer: Self-pay

## 2022-07-19 ENCOUNTER — Telehealth: Payer: Self-pay | Admitting: Internal Medicine

## 2022-07-19 NOTE — Telephone Encounter (Signed)
Pharmacy Patient Advocate Encounter   Received notification from [facility] that prior authorization for Trulicity [id number] pen-injectors is required/requested.  Per Test Claim: PA required   PA submitted on 07/19/22 to (ins) [employer] via CoverMyMeds Key [id number] Status is pending

## 2022-07-19 NOTE — Telephone Encounter (Signed)
Patient called and said that her pharmacy said that she needs a prior authorization on her trulicity.

## 2022-07-20 ENCOUNTER — Other Ambulatory Visit: Payer: Self-pay | Admitting: Internal Medicine

## 2022-07-20 ENCOUNTER — Telehealth: Payer: Self-pay

## 2022-07-20 ENCOUNTER — Other Ambulatory Visit (HOSPITAL_COMMUNITY): Payer: Self-pay

## 2022-07-20 ENCOUNTER — Ambulatory Visit: Payer: 59 | Admitting: Neurology

## 2022-07-20 DIAGNOSIS — G4733 Obstructive sleep apnea (adult) (pediatric): Secondary | ICD-10-CM | POA: Diagnosis not present

## 2022-07-20 DIAGNOSIS — E118 Type 2 diabetes mellitus with unspecified complications: Secondary | ICD-10-CM

## 2022-07-20 DIAGNOSIS — G4734 Idiopathic sleep related nonobstructive alveolar hypoventilation: Secondary | ICD-10-CM

## 2022-07-20 DIAGNOSIS — I5032 Chronic diastolic (congestive) heart failure: Secondary | ICD-10-CM

## 2022-07-20 DIAGNOSIS — Z6841 Body Mass Index (BMI) 40.0 and over, adult: Secondary | ICD-10-CM

## 2022-07-20 MED ORDER — METFORMIN HCL ER 750 MG PO TB24: 1500.0000 mg | ORAL_TABLET | Freq: Every day | ORAL | 1 refills | Status: DC

## 2022-07-20 MED ORDER — EMPAGLIFLOZIN 25 MG PO TABS: 25.0000 mg | ORAL_TABLET | Freq: Every day | ORAL | 1 refills | Status: DC

## 2022-07-20 NOTE — Telephone Encounter (Signed)
Patient Advocate Encounter  Prior Authorization for Trulicity [id number] pen-injectors has been approved.    PA#  [id number] Effective dates: 07/19/22 through 07/20/23

## 2022-07-20 NOTE — Telephone Encounter (Signed)
Pt has called and stated her insurance will no longer cover the   XIGDUO XR 10-998 MG TB24  However,  Insurance will cover the Synjardy XR medication. Pt has asked that a rx be sent over for that instead.

## 2022-07-22 NOTE — Progress Notes (Signed)
   [facility]  HOME SLEEP STUDY  STUDY DATE: 07/20/2022 PATIENT NAME: Lorraine Holt DOB: Aug 14, 1965 MRN: [id number]  INTERPRETING CLINICIAN: Richard A. Felecia Shelling, MD. PhD   CLINICAL INFORMATION: 57 year old woman with known obstructive sleep apnea who had tried CPAP in the past.  She has witnessed sleep apnea and excessive daytime sleepiness ([place] 10/24)   IMPRESSION:  Moderate obstructive sleep apnea with pAHI 3% = 25.9 (severe during REM sleep with pAHI = 53.5.  The ODI 4% was 15.3/h (severe during REM at 40.1/h) Mild nocturnal hypoxemia with 7.2 minutes of sleep below SaO2 of 88% and 2.9 minutes below 85% Mildly reduced sleep efficiency of 77%   RECOMMENDATION: I recommend that she be placed on AutoPap with a range of 5 to 15 cm H2O and heated humidifier with a download and follow-up visit in 30 to 90 days. Her BMI is 45 kg/m and she should also attempt weight loss.   INTERPRETING PHYSICIAN:   Richard A. Felecia Shelling, MD, PhD, [facility] Certified in Neurology, Clinical Neurophysiology, Sleep Medicine, Pain Medicine and Neuroimaging  [facility] [address], [place], [place] [address] [phone]

## 2022-07-23 ENCOUNTER — Other Ambulatory Visit: Payer: Self-pay | Admitting: Internal Medicine

## 2022-07-26 ENCOUNTER — Telehealth: Payer: Self-pay | Admitting: *Deleted

## 2022-07-26 DIAGNOSIS — G4733 Obstructive sleep apnea (adult) (pediatric): Secondary | ICD-10-CM

## 2022-07-26 NOTE — Telephone Encounter (Signed)
Called pt at [phone]. Went straight to VM. LVM for her to call office back.

## 2022-07-26 NOTE — Telephone Encounter (Signed)
-----   Message from Britt Bottom, MD sent at 07/23/2022 10:09 AM EST ----- Regarding: Sleep study The home sleep study showed that she had moderate sleep apnea during the night but it was severe during rapid eye movement sleep.  She also had low oxygen for several minutes.  I recommend that she be placed on AutoPap 5 to 15 cm H2O with heated humidifier.  Download and follow-up with me or Amy in 30 to 90 days

## 2022-07-27 ENCOUNTER — Telehealth: Payer: Self-pay | Admitting: *Deleted

## 2022-07-27 NOTE — Telephone Encounter (Signed)
Please call patient and schedule her new cpap follow up with visit in May with Dr.Sater or Amy in May.

## 2022-07-27 NOTE — Addendum Note (Signed)
Addended by: Thamas Jaegers on: 07/27/2022 03:09 PM   Modules accepted: Orders

## 2022-07-27 NOTE — Telephone Encounter (Addendum)
Patient informed with below results. She still has [employer], pt said she is willing to try cpap. Pt report she used cpap machine about 3 years ago and did not do well on machine. Pt states her sinusitis flares up at night time. Pt was fine with using [facility] office notes faxed to [phone]  Separate message sent to phone to call and schedule cpap follow up visit.

## 2022-07-28 NOTE — Telephone Encounter (Signed)
Called pt to scheduled CPAP f/u. Pt stated she will call back to schedule because she is with a client at the moment.

## 2022-08-23 ENCOUNTER — Other Ambulatory Visit: Payer: Self-pay | Admitting: Internal Medicine

## 2022-08-23 ENCOUNTER — Other Ambulatory Visit: Payer: Self-pay | Admitting: *Deleted

## 2022-08-23 DIAGNOSIS — I110 Hypertensive heart disease with heart failure: Secondary | ICD-10-CM

## 2022-08-23 DIAGNOSIS — I10 Essential (primary) hypertension: Secondary | ICD-10-CM

## 2022-08-23 MED ORDER — LAMOTRIGINE 25 MG PO TABS: 25.0000 mg | ORAL_TABLET | Freq: Two times a day (BID) | ORAL | 1 refills | Status: DC

## 2022-08-23 NOTE — Telephone Encounter (Signed)
Last seen on 05/12/2022 Follow up scheduled on 10/13/22 I called patient to confirm she is still taking Rx Lamictal I wanted to confirmed she is taking 25 mg tablet bid. Rx sent.

## 2022-09-02 DIAGNOSIS — E785 Hyperlipidemia, unspecified: Secondary | ICD-10-CM | POA: Diagnosis not present

## 2022-09-02 DIAGNOSIS — E1142 Type 2 diabetes mellitus with diabetic polyneuropathy: Secondary | ICD-10-CM | POA: Diagnosis not present

## 2022-09-02 DIAGNOSIS — Z7982 Long term (current) use of aspirin: Secondary | ICD-10-CM | POA: Diagnosis not present

## 2022-09-02 DIAGNOSIS — E1136 Type 2 diabetes mellitus with diabetic cataract: Secondary | ICD-10-CM | POA: Diagnosis not present

## 2022-09-02 DIAGNOSIS — I4891 Unspecified atrial fibrillation: Secondary | ICD-10-CM | POA: Diagnosis not present

## 2022-09-02 DIAGNOSIS — Z7984 Long term (current) use of oral hypoglycemic drugs: Secondary | ICD-10-CM | POA: Diagnosis not present

## 2022-09-02 DIAGNOSIS — I509 Heart failure, unspecified: Secondary | ICD-10-CM | POA: Diagnosis not present

## 2022-09-02 DIAGNOSIS — Z8249 Family history of ischemic heart disease and other diseases of the circulatory system: Secondary | ICD-10-CM | POA: Diagnosis not present

## 2022-09-02 DIAGNOSIS — D6869 Other thrombophilia: Secondary | ICD-10-CM | POA: Diagnosis not present

## 2022-09-02 DIAGNOSIS — Z833 Family history of diabetes mellitus: Secondary | ICD-10-CM | POA: Diagnosis not present

## 2022-09-02 DIAGNOSIS — I110 Hypertensive heart disease with heart failure: Secondary | ICD-10-CM | POA: Diagnosis not present

## 2022-09-02 DIAGNOSIS — M1990 Unspecified osteoarthritis, unspecified site: Secondary | ICD-10-CM | POA: Diagnosis not present

## 2022-09-06 ENCOUNTER — Other Ambulatory Visit: Payer: Self-pay | Admitting: Internal Medicine

## 2022-09-06 DIAGNOSIS — I110 Hypertensive heart disease with heart failure: Secondary | ICD-10-CM

## 2022-09-06 DIAGNOSIS — I10 Essential (primary) hypertension: Secondary | ICD-10-CM

## 2022-10-11 ENCOUNTER — Ambulatory Visit: Payer: 59 | Admitting: Family Medicine

## 2022-10-11 ENCOUNTER — Encounter: Payer: Self-pay | Admitting: Family Medicine

## 2022-10-11 VITALS — BP 142/82 | HR 90 | Temp 98.1°F | Resp 20 | Ht 71.0 in | Wt 313.0 lb

## 2022-10-11 DIAGNOSIS — H1031 Unspecified acute conjunctivitis, right eye: Secondary | ICD-10-CM | POA: Diagnosis not present

## 2022-10-11 MED ORDER — POLYMYXIN B-TRIMETHOPRIM 10000-0.1 UNIT/ML-% OP SOLN: 1.0000 [drp] | OPHTHALMIC | 0 refills | Status: AC

## 2022-10-11 NOTE — Progress Notes (Signed)
Assessment & Plan:  1. Acute bacterial conjunctivitis of right eye - trimethoprim-polymyxin b (POLYTRIM) ophthalmic solution; Place 1 drop into both eyes every 4 (four) hours for 7 days.  Dispense: 10 mL; Refill: 0   Follow up plan: Return if symptoms worsen or fail to improve.  Deliah Boston, MSN, APRN, FNP-C  Subjective:  HPI: Lorraine Holt is a 57 y.o. female presenting on 10/11/2022 for Eye Problem (Right eye irritation - started on Sat am - swollen, red and clear drainage - using allergy OTC eye gtts )  Patient reports that her right eye is swollen, red, and slightly painful with clear drainage.  This started 2 days ago.  The day prior she bought a new car that has been sitting for over a year and turned on the [facility] that balloon into her face. She has been instilling allergy eye drops.    ROS: Negative unless specifically indicated above in HPI.   Relevant past medical history reviewed and updated as indicated.   Allergies and medications reviewed and updated.   Current Outpatient Medications:    blood glucose meter kit and supplies, Dispense based on patient and insurance preference. Use up to two times daily as directed. (FOR ICD-10 E10.9, E11.9)., Disp: 1 each, Rfl: 0   carvedilol (COREG) 3.125 MG tablet, TAKE 1 TABLET BY MOUTH TWICE DAILY WITH A MEAL, Disp: 180 tablet, Rfl: 0   docusate sodium (COLACE) 50 MG capsule, Take 50 mg by mouth daily., Disp: , Rfl:    Dulaglutide (TRULICITY) 0.75 MG/0.5ML SOPN, INJECT 0.75 MG INTO THE SKIN ONCE A WEEK, Disp: 6 mL, Rfl: 1   empagliflozin (JARDIANCE) 25 MG TABS tablet, Take 1 tablet (25 mg total) by mouth daily., Disp: 90 tablet, Rfl: 1   indapamide (LOZOL) 1.25 MG tablet, Take 1 tablet by mouth once daily, Disp: 90 tablet, Rfl: 0   lamoTRIgine (LAMICTAL) 25 MG tablet, Take 1 tablet (25 mg total) by mouth 2 (two) times daily., Disp: 60 tablet, Rfl: 1   metFORMIN (GLUCOPHAGE-XR) 750 MG 24 hr tablet, Take 2 tablets (1,500 mg total) by  mouth daily with breakfast., Disp: 180 tablet, Rfl: 1   pravastatin (PRAVACHOL) 40 MG tablet, Take 1 tablet by mouth once daily, Disp: 90 tablet, Rfl: 0   thiamine (VITAMIN B-1) 50 MG tablet, Take 1 tablet (50 mg total) by mouth daily., Disp: 90 tablet, Rfl: 1   TRUEPLUS LANCETS 30G MISC, USE TWICE DAILY, Disp: 100 each, Rfl: 11  Allergies  Allergen Reactions   Clindamycin/Lincomycin Swelling    Tongue swelled   Penicillins Swelling and Rash   Amlodipine     Constipation, dizziness    Objective:   BP (!) 142/82   Pulse 90   Temp 98.1 F (36.7 C)   Resp 20   Ht 5\' 11"  (1.803 m)   Wt (!) 313 lb (142 kg)   LMP 08/08/2014   BMI 43.65 kg/m    Physical Exam Vitals reviewed.  Constitutional:      General: She is not in acute distress.    Appearance: Normal appearance. She is not ill-appearing, toxic-appearing or diaphoretic.  HENT:     Head: Normocephalic and atraumatic.  Eyes:     General: Lids are everted, no foreign bodies appreciated. No scleral icterus.       Right eye: No foreign body, discharge or hordeolum.        Left eye: No discharge.     Conjunctiva/sclera:     Right eye:  Right conjunctiva is injected.     Comments: Fluorescein eye stain did not detect any corneal abrasion.  Cardiovascular:     Rate and Rhythm: Normal rate.  Pulmonary:     Effort: Pulmonary effort is normal. No respiratory distress.  Musculoskeletal:        General: Normal range of motion.     Cervical back: Normal range of motion.  Skin:    General: Skin is warm and dry.     Capillary Refill: Capillary refill takes less than 2 seconds.  Neurological:     General: No focal deficit present.     Mental Status: She is alert and oriented to person, place, and time. Mental status is at baseline.  Psychiatric:        Mood and Affect: Mood normal.        Behavior: Behavior normal.        Thought Content: Thought content normal.        Judgment: Judgment normal.

## 2022-10-12 ENCOUNTER — Other Ambulatory Visit: Payer: Self-pay | Admitting: Internal Medicine

## 2022-10-12 DIAGNOSIS — E785 Hyperlipidemia, unspecified: Secondary | ICD-10-CM

## 2022-10-13 ENCOUNTER — Ambulatory Visit (INDEPENDENT_AMBULATORY_CARE_PROVIDER_SITE_OTHER): Payer: 59 | Admitting: Neurology

## 2022-10-13 ENCOUNTER — Encounter: Payer: Self-pay | Admitting: Neurology

## 2022-10-13 VITALS — BP 114/77 | HR 94 | Ht 71.0 in | Wt 310.4 lb

## 2022-10-13 DIAGNOSIS — G5601 Carpal tunnel syndrome, right upper limb: Secondary | ICD-10-CM

## 2022-10-13 DIAGNOSIS — R413 Other amnesia: Secondary | ICD-10-CM

## 2022-10-13 DIAGNOSIS — M7552 Bursitis of left shoulder: Secondary | ICD-10-CM | POA: Diagnosis not present

## 2022-10-13 DIAGNOSIS — G4734 Idiopathic sleep related nonobstructive alveolar hypoventilation: Secondary | ICD-10-CM | POA: Diagnosis not present

## 2022-10-13 DIAGNOSIS — R200 Anesthesia of skin: Secondary | ICD-10-CM | POA: Diagnosis not present

## 2022-10-13 DIAGNOSIS — G4733 Obstructive sleep apnea (adult) (pediatric): Secondary | ICD-10-CM | POA: Diagnosis not present

## 2022-10-13 DIAGNOSIS — R202 Paresthesia of skin: Secondary | ICD-10-CM | POA: Diagnosis not present

## 2022-10-13 MED ORDER — LAMOTRIGINE 25 MG PO TABS: 50.0000 mg | ORAL_TABLET | Freq: Two times a day (BID) | ORAL | 11 refills | Status: DC

## 2022-10-13 NOTE — Progress Notes (Signed)
[facility]  PATIENT: Lorraine Holt DOB: 1966-04-13  REFERRING DOCTOR OR PCP: Sanda Linger, MD SOURCE: Patient, notes from primary care, imaging and lab reports,  _________________________________   HISTORICAL  CHIEF COMPLAINT:  Chief Complaint  Patient presents with   Follow-up    RM 10 ,alone. Last seen 05/12/22.  Needs refill of lamotrigine 50mg  po BID.    HISTORY OF PRESENT ILLNESS:  Lorraine Holt is a 57 y.o. woman with numbness, shoulder pain and sleep apnea. UPDATE 5/8/224 After a subacromial bursa injection on the right  the shoulder pain improved.  Now the left shoulder is hurting more.   She has moderate right CTS on NCV but feels better than last visit.   She still has some tingling in the hand  Her husband feels she is having more memory issues.  She feels her memory is fine but she does not focus as well.   She has moderate OSA (AHI = 25.9on HST 07/20/2022 and SaO2 nadir was 78%.  She  tried CPAP in 2018 (mild OSA with hypoxemia) but had trouble tolerating it so she stopped.   We re-ordered but insurance was not going to cover well.  We discussed treating thr hypoxemia.   She has access to an oxygen concentrator  EPWORTH SLEEPINESS SCALE  On a scale of 0 - 3 what is the chance of dozing:  Sitting and Reading:   0 Watching TV:    3 Sitting inactive in a public place: 1 Passenger in car for one hour: 0 Lying down to rest in the afternoon: 3 Sitting and talking to someone: 0 Sitting quietly after lunch:  3 In a car, stopped in traffic:  0  Total (out of 24):     11/24  mild EDS   She has NIDDM. 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  REVIEW OF SYSTEMS: Constitutional: No fevers, chills, sweats, or change in appetite Eyes: No visual changes, double vision, eye pain Ear, nose and throat: No hearing loss, ear pain, nasal congestion, sore throat Cardiovascular: No chest pain, palpitations Respiratory:  No shortness of  breath at rest or with exertion.   No wheezes GastrointestinaI: No nausea, vomiting, diarrhea, abdominal pain, fecal incontinence Genitourinary:  No dysuria, urinary retention or frequency.  No nocturia. Musculoskeletal:  No neck pain, back pain Integumentary: No rash, pruritus, skin lesions Neurological: as above Psychiatric: No depression at this time.  No anxiety Endocrine: No palpitations, diaphoresis, change in appetite, change in weigh or increased thirst Hematologic/Lymphatic:  No anemia, purpura, petechiae. Allergic/Immunologic: No itchy/runny eyes, nasal congestion, recent allergic reactions, rashes  ALLERGIES: Allergies  Allergen Reactions   Clindamycin/Lincomycin Swelling    Tongue swelled   Penicillins Swelling and Rash   Amlodipine     Constipation, dizziness    HOME MEDICATIONS:  Current Outpatient Medications:    blood glucose meter kit and supplies, Dispense based on patient and insurance preference. Use up to two times daily as directed. (FOR ICD-10 E10.9, E11.9)., Disp: 1 each, Rfl: 0   carvedilol (COREG) 3.125 MG tablet, TAKE 1 TABLET BY MOUTH TWICE DAILY WITH A MEAL, Disp: 180 tablet, Rfl: 0   docusate sodium (COLACE) 50 MG capsule, Take 50 mg by mouth daily., Disp: , Rfl:    Dulaglutide (TRULICITY) 0.75 MG/0.5ML SOPN, INJECT 0.75 MG INTO THE SKIN ONCE A WEEK, Disp: 6 mL, Rfl: 1   empagliflozin (JARDIANCE) 25 MG TABS tablet, Take 1 tablet (25 mg total) by mouth  daily., Disp: 90 tablet, Rfl: 1   indapamide (LOZOL) 1.25 MG tablet, Take 1 tablet by mouth once daily, Disp: 90 tablet, Rfl: 0   metFORMIN (GLUCOPHAGE-XR) 750 MG 24 hr tablet, Take 2 tablets (1,500 mg total) by mouth daily with breakfast., Disp: 180 tablet, Rfl: 1   pravastatin (PRAVACHOL) 40 MG tablet, Take 1 tablet by mouth once daily, Disp: 90 tablet, Rfl: 0   thiamine (VITAMIN B-1) 50 MG tablet, Take 1 tablet (50 mg total) by mouth daily., Disp: 90 tablet, Rfl: 1   trimethoprim-polymyxin b (POLYTRIM)  ophthalmic solution, Place 1 drop into both eyes every 4 (four) hours for 7 days., Disp: 10 mL, Rfl: 0   TRUEPLUS LANCETS 30G MISC, USE TWICE DAILY, Disp: 100 each, Rfl: 11   lamoTRIgine (LAMICTAL) 25 MG tablet, Take 2 tablets (50 mg total) by mouth 2 (two) times daily., Disp: 120 tablet, Rfl: 11  PAST MEDICAL HISTORY: Past Medical History:  Diagnosis Date   Anemia    history age 69'S   Arthritis    [employer] 02/24/2020   Bronchitis    Carpal tunnel syndrome of right wrist    Diabetes mellitus    Diabetes mellitus without complication (HCC)    [employer] 02/24/2020   Hyperlipidemia    Hypertension    Obesity    Peripheral vascular disease (HCC)    undiagnosis, but complains of numbness in legs   Shortness of breath dyspnea    with exertion   Sleep apnea    [employer] 02/24/2020   Tooth abscess 09/30/14   tooth was extracted    PAST SURGICAL HISTORY: Past Surgical History:  Procedure Laterality Date   ABDOMINAL HYSTERECTOMY  09/2015   BILATERAL SALPINGECTOMY Right 09/17/2014   Procedure: RIGHT SALPINGECTOMY;  Surgeon: Willodean Rosenthal, MD;  Location: [facility];  Service: Gynecology;  Laterality: Right;   BREAST BIOPSY Left    COLONOSCOPY  04/2014   DILATION AND CURETTAGE OF UTERUS     ROBOTIC ASSISTED TOTAL HYSTERECTOMY N/A 09/17/2014   Procedure: ROBOTIC ASSISTED TOTAL HYSTERECTOMY;  Surgeon: Willodean Rosenthal, MD;  Location: [facility];  Service: Gynecology;  Laterality: N/A;   TOOTH EXTRACTION  09/30/14   TUBAL LIGATION      FAMILY HISTORY: Family History  Problem Relation Age of Onset   Diabetes Maternal Aunt    Breast cancer Maternal Aunt    Hypertension Mother    Diabetes Mother    Breast cancer Mother    Diabetes Maternal Uncle    Breast cancer Maternal Aunt    COPD Neg Hx    Early death Neg Hx    Heart disease Neg Hx    Hyperlipidemia Neg Hx    Kidney disease Neg Hx    Stroke Neg Hx     SOCIAL HISTORY: Social History   Socioeconomic History    Marital status: Married    Spouse name: Not on file   Number of children: Not on file   Years of education: Not on file   Highest education level: Not on file  Occupational History   Occupation: [employer]  Tobacco Use   Smoking status: Every Day    Packs/day: 0.50    Years: 20.00    Additional pack years: 0.00    Total pack years: 10.00    Types: Cigarettes   Smokeless tobacco: Never  Substance and Sexual Activity   Alcohol use: No   Drug use: No   Sexual activity: Never    Birth control/protection: Surgical  Other  Topics Concern   Not on file  Social History Narrative   Not on file   Social Determinants of Health   Financial Resource Strain: Not on file  Food Insecurity: Not on file  Transportation Needs: Not on file  Physical Activity: Not on file  Stress: Not on file  Social Connections: Not on file  Intimate Partner Violence: Not on file       PHYSICAL EXAM  Vitals:   10/13/22 1439  BP: 114/77  Pulse: 94  Weight: (!) 310 lb 6.4 oz (140.8 kg)  Height: 5\' 11"  (1.803 m)    Body mass index is 43.29 kg/m.   General: The patient is an obese woman in no acute distress  HEENT:  Head is [place]/AT.  Sclera are anicteric.    Neck: No carotid bruits are noted.  The neck is nontender.  Skin: She has a callus adjacent to the second toe on the right.  Musculoskeletal: She has reduced ROM in right shoulder.   She has mildly reduced range of motion in the right shoulder.  The subacromial bursa pain is improved compared to the last visit.  Neurologic Exam  Mental status: The patient is alert and oriented x 3 at the time of the examination. The patient has apparent normal recent and remote memory, with an apparently normal attention span and concentration ability.   Speech is normal.  Cranial nerves: Extraocular movements are full. PFacial stregth and sesation were normal.    . No obvious hearing deficits are noted.  Motor:  Muscle bulk is normal.   Tone  is normal. Strength is  5 / 5 in all 4 extremities except 4+/5 right APB.   Sensory: Today, sensation was normal in the hands  Other:  Tinels sign at the right wrist.   Coordination: Cerebellar testing reveals good finger-nose-finger and heel-to-shin bilaterally.  Gait and station: Station is normal.   Gait is normal. Tandem gait is normal. Romberg is negative.   Reflexes: Deep tendon reflexes are symmetric and normal bilaterally.      DIAGNOSTIC DATA (LABS, IMAGING, TESTING) - I reviewed patient records, labs, notes, testing and imaging myself where available.  Lab Results  Component Value Date   WBC 5.3 06/17/2022   HGB 15.0 06/17/2022   HCT 46.0 06/17/2022   MCV 89.3 06/17/2022   PLT 245.0 06/17/2022      Component Value Date/Time   NA 142 06/17/2022 1144   NA 141 02/29/2020 1115   K 3.9 06/17/2022 1144   CL 103 06/17/2022 1144   CO2 30 06/17/2022 1144   GLUCOSE 131 (H) 06/17/2022 1144   BUN 12 06/17/2022 1144   BUN 14 02/29/2020 1115   CREATININE 0.80 06/17/2022 1144   CALCIUM 9.7 06/17/2022 1144   PROT 7.5 06/17/2022 1144   PROT 7.2 02/29/2020 1115   ALBUMIN 3.9 06/17/2022 1144   ALBUMIN 4.1 02/29/2020 1115   AST 16 06/17/2022 1144   ALT 17 06/17/2022 1144   ALKPHOS 129 (H) 06/17/2022 1144   BILITOT 0.4 06/17/2022 1144   BILITOT 0.2 02/29/2020 1115   GFRNONAA 84 02/29/2020 1115   GFRAA 97 02/29/2020 1115   Lab Results  Component Value Date   CHOL 182 06/17/2022   HDL 51.00 06/17/2022   LDLCALC 108 (H) 06/17/2022   TRIG 112.0 06/17/2022   CHOLHDL 4 06/17/2022   Lab Results  Component Value Date   HGBA1C 7.5 (H) 06/17/2022   Lab Results  Component Value Date   VITAMINB12 683 12/16/2021  Lab Results  Component Value Date   TSH 2.41 06/18/2021       ASSESSMENT AND PLAN  Nocturnal hypoxemia - Plan: PR CANNULA NASAL  OSA (obstructive sleep apnea)  Carpal tunnel syndrome of right wrist  Bursitis of left shoulder  Memory loss  Numbness  and tingling   I advised CPAP but she prefers to just treat the hypoxeimia (with nasal cannula and 2L) Left subacromial bursa injected with 40 mg Depo-Medrol and 2.5 cc Marcaine using sterile technique.  She tolerated the procedure well and pain was better afterwards. Stay active and exercise as tolerated.  Advised weight loss Return in 12 months or sooner if there are new or worsening neurologic symptoms.     Evony Rezek A. Epimenio Foot, MD, [facility] 10/13/2022, 9:04 PM Certified in Neurology, Clinical Neurophysiology, Sleep Medicine and Neuroimaging  [facility] [address] [place], [place] [address] [phone]

## 2022-10-25 ENCOUNTER — Telehealth: Payer: Self-pay

## 2022-10-25 NOTE — Telephone Encounter (Signed)
Per pharmacy Jardiance requires PA.  PA started via [facility]: [id number]

## 2022-10-26 ENCOUNTER — Other Ambulatory Visit (HOSPITAL_COMMUNITY): Payer: Self-pay

## 2022-11-08 ENCOUNTER — Other Ambulatory Visit: Payer: Self-pay | Admitting: Internal Medicine

## 2022-11-08 DIAGNOSIS — I10 Essential (primary) hypertension: Secondary | ICD-10-CM

## 2022-11-08 DIAGNOSIS — I110 Hypertensive heart disease with heart failure: Secondary | ICD-10-CM

## 2022-11-10 ENCOUNTER — Other Ambulatory Visit (HOSPITAL_COMMUNITY): Payer: Self-pay

## 2022-11-10 NOTE — Telephone Encounter (Signed)
Pharmacy Patient Advocate Encounter  Prior Authorization for Jardiance 25MG  tablets has been APPROVED by [facility] from 10/26/2022 to 10/26/2023.   PA # [id number]

## 2022-11-11 ENCOUNTER — Ambulatory Visit: Payer: 59 | Admitting: Neurology

## 2022-12-12 ENCOUNTER — Other Ambulatory Visit: Payer: Self-pay | Admitting: Internal Medicine

## 2022-12-12 DIAGNOSIS — I10 Essential (primary) hypertension: Secondary | ICD-10-CM

## 2022-12-12 DIAGNOSIS — I110 Hypertensive heart disease with heart failure: Secondary | ICD-10-CM

## 2022-12-16 ENCOUNTER — Encounter: Payer: Self-pay | Admitting: Internal Medicine

## 2022-12-16 ENCOUNTER — Ambulatory Visit: Payer: 59 | Admitting: Internal Medicine

## 2022-12-16 VITALS — BP 136/84 | HR 95 | Temp 97.9°F | Resp 16 | Ht 71.0 in | Wt 308.0 lb

## 2022-12-16 DIAGNOSIS — J431 Panlobular emphysema: Secondary | ICD-10-CM | POA: Diagnosis not present

## 2022-12-16 DIAGNOSIS — Z1231 Encounter for screening mammogram for malignant neoplasm of breast: Secondary | ICD-10-CM

## 2022-12-16 DIAGNOSIS — E785 Hyperlipidemia, unspecified: Secondary | ICD-10-CM

## 2022-12-16 DIAGNOSIS — D508 Other iron deficiency anemias: Secondary | ICD-10-CM

## 2022-12-16 DIAGNOSIS — E118 Type 2 diabetes mellitus with unspecified complications: Secondary | ICD-10-CM | POA: Diagnosis not present

## 2022-12-16 DIAGNOSIS — Z0001 Encounter for general adult medical examination with abnormal findings: Secondary | ICD-10-CM

## 2022-12-16 DIAGNOSIS — D492 Neoplasm of unspecified behavior of bone, soft tissue, and skin: Secondary | ICD-10-CM | POA: Diagnosis not present

## 2022-12-16 DIAGNOSIS — E2839 Other primary ovarian failure: Secondary | ICD-10-CM

## 2022-12-16 DIAGNOSIS — I2510 Atherosclerotic heart disease of native coronary artery without angina pectoris: Secondary | ICD-10-CM | POA: Diagnosis not present

## 2022-12-16 DIAGNOSIS — I10 Essential (primary) hypertension: Secondary | ICD-10-CM | POA: Diagnosis not present

## 2022-12-16 DIAGNOSIS — I700 Atherosclerosis of aorta: Secondary | ICD-10-CM | POA: Diagnosis not present

## 2022-12-16 DIAGNOSIS — I110 Hypertensive heart disease with heart failure: Secondary | ICD-10-CM

## 2022-12-16 DIAGNOSIS — E7841 Elevated Lipoprotein(a): Secondary | ICD-10-CM

## 2022-12-16 LAB — BASIC METABOLIC PANEL
BUN: 17 mg/dL (ref 6–23)
CO2: 31 mEq/L (ref 19–32)
Calcium: 9.5 mg/dL (ref 8.4–10.5)
Chloride: 103 mEq/L (ref 96–112)
Creatinine, Ser: 0.79 mg/dL (ref 0.40–1.20)
GFR: 81.87 mL/min (ref >60.00)
Glucose, Bld: 99 mg/dL (ref 70–99)
Potassium: 3.6 mEq/L (ref 3.5–5.1)
Sodium: 140 mEq/L (ref 135–145)

## 2022-12-16 LAB — CBC WITH DIFFERENTIAL/PLATELET
Basophils Absolute: 0 10*3/uL (ref 0.0–0.1)
Basophils Relative: 0.7 % (ref 0.0–3.0)
Eosinophils Absolute: 0.2 10*3/uL (ref 0.0–0.7)
Eosinophils Relative: 4.2 % (ref 0.0–5.0)
HCT: 45.6 % (ref 36.0–46.0)
Hemoglobin: 14.7 g/dL (ref 12.0–15.0)
Lymphocytes Relative: 30.1 % (ref 12.0–46.0)
Lymphs Abs: 1.8 10*3/uL (ref 0.7–4.0)
MCHC: 32.1 g/dL (ref 30.0–36.0)
MCV: 89.6 fl (ref 78.0–100.0)
Monocytes Absolute: 0.5 10*3/uL (ref 0.1–1.0)
Monocytes Relative: 9.4 % (ref 3.0–12.0)
Neutro Abs: 3.2 10*3/uL (ref 1.4–7.7)
Neutrophils Relative %: 55.6 % (ref 43.0–77.0)
Platelets: 227 10*3/uL (ref 150.0–400.0)
RBC: 5.09 Mil/uL (ref 3.87–5.11)
RDW: 15.6 % — ABNORMAL HIGH (ref 11.5–15.5)
WBC: 5.8 10*3/uL (ref 4.0–10.5)

## 2022-12-16 LAB — TROPONIN I (HIGH SENSITIVITY): High Sens Troponin I: 4 ng/L (ref 2–17)

## 2022-12-16 LAB — HEMOGLOBIN A1C: Hgb A1c MFr Bld: 6.4 % (ref 4.6–6.5)

## 2022-12-16 NOTE — Progress Notes (Unsigned)
Subjective:  Patient ID: Lorraine Holt, female    DOB: 05-Mar-1966  Age: 57 y.o. MRN: [id number]  CC: Coronary Artery Disease, Hypertension, Diabetes, Hyperlipidemia, and Annual Exam   HPI TELIA AMUNDSON presents for f/up ----  Discussed the use of AI scribe software for clinical note transcription with the patient, who gave verbal consent to proceed.  History of Present Illness   The patient, a smoker with no known lung disease, presents with complaints of lower extremity stiffness and pain. The discomfort is primarily located in the shins and the posterior aspect of the legs, and is exacerbated upon standing after sitting. The stiffness tends to improve after a few steps. Additionally, they experience cramping in the calves radiating up to the buttocks during ambulation. They deny any chest pain or shortness of breath. They are due for an annual lung scan later in the year.       Outpatient Medications Prior to Visit  Medication Sig Dispense Refill   blood glucose meter kit and supplies Dispense based on patient and insurance preference. Use up to two times daily as directed. (FOR ICD-10 E10.9, E11.9). 1 each 0   docusate sodium (COLACE) 50 MG capsule Take 50 mg by mouth daily.     Dulaglutide (TRULICITY) 0.75 MG/0.5ML SOPN INJECT 0.75 MG INTO THE SKIN ONCE A WEEK 6 mL 1   empagliflozin (JARDIANCE) 25 MG TABS tablet Take 1 tablet (25 mg total) by mouth daily. 90 tablet 1   indapamide (LOZOL) 1.25 MG tablet Take 1 tablet by mouth once daily 90 tablet 0   lamoTRIgine (LAMICTAL) 25 MG tablet Take 2 tablets (50 mg total) by mouth 2 (two) times daily. 120 tablet 11   metFORMIN (GLUCOPHAGE-XR) 750 MG 24 hr tablet Take 2 tablets (1,500 mg total) by mouth daily with breakfast. 180 tablet 1   pravastatin (PRAVACHOL) 40 MG tablet Take 1 tablet by mouth once daily 90 tablet 0   thiamine (VITAMIN B-1) 50 MG tablet Take 1 tablet (50 mg total) by mouth daily. 90 tablet 1   TRUEPLUS LANCETS 30G  MISC USE TWICE DAILY 100 each 11   carvedilol (COREG) 3.125 MG tablet TAKE 1 TABLET BY MOUTH TWICE DAILY WITH A MEAL 180 tablet 0   No facility-administered medications prior to visit.    ROS Review of Systems  Constitutional: Negative.  Negative for diaphoresis and fatigue.  HENT: Negative.    Respiratory:  Positive for shortness of breath. Negative for cough, chest tightness and wheezing.   Cardiovascular:  Negative for chest pain, palpitations and leg swelling.  Gastrointestinal:  Negative for abdominal pain, constipation, diarrhea, nausea and vomiting.  Genitourinary: Negative.  Negative for difficulty urinating.  Musculoskeletal:  Positive for arthralgias and gait problem. Negative for back pain and myalgias.  Skin: Negative.   Neurological:  Negative for dizziness and weakness.  Hematological:  Negative for adenopathy. Does not bruise/bleed easily.  Psychiatric/Behavioral: Negative.      Objective:  BP 136/84 (BP Location: Right Arm, Patient Position: Sitting, Cuff Size: Large)   Pulse 95   Temp 97.9 F (36.6 C) (Oral)   Resp 16   Ht 5\' 11"  (1.803 m)   Wt (!) 308 lb (139.7 kg)   LMP 08/08/2014   SpO2 93%   BMI 42.96 kg/m   BP Readings from Last 3 Encounters:  12/16/22 136/84  10/13/22 114/77  10/11/22 (!) 142/82    Wt Readings from Last 3 Encounters:  12/16/22 Marland Kitchen)  308 lb (139.7 kg)  10/13/22 (!) 310 lb 6.4 oz (140.8 kg)  10/11/22 (!) 313 lb (142 kg)    Physical Exam Vitals reviewed.  Constitutional:      Appearance: She is obese. She is ill-appearing.  HENT:     Mouth/Throat:     Mouth: Mucous membranes are moist.  Eyes:     General: No scleral icterus.    Conjunctiva/sclera: Conjunctivae normal.  Cardiovascular:     Rate and Rhythm: Normal rate and regular rhythm.     Heart sounds: No murmur heard.    No friction rub. No gallop.  Pulmonary:     Effort: Pulmonary effort is normal.     Breath sounds: No stridor. No wheezing, rhonchi or rales.   Abdominal:     General: Abdomen is protuberant. Bowel sounds are normal. There is no distension.     Palpations: Abdomen is soft. There is no hepatomegaly, splenomegaly or mass.     Tenderness: There is no abdominal tenderness. There is no guarding.  Musculoskeletal:        General: Normal range of motion.     Cervical back: Neck supple.     Right lower leg: No edema.     Left lower leg: No edema.  Lymphadenopathy:     Cervical: No cervical adenopathy.  Skin:    Findings: Lesion present.  Neurological:     General: No focal deficit present.     Mental Status: She is alert and oriented to person, place, and time.  Psychiatric:        Mood and Affect: Mood normal.        Behavior: Behavior normal.     Lab Results  Component Value Date   WBC 5.8 12/16/2022   HGB 14.7 12/16/2022   HCT 45.6 12/16/2022   PLT 227.0 12/16/2022   GLUCOSE 99 12/16/2022   CHOL 182 06/17/2022   TRIG 112.0 06/17/2022   HDL 51.00 06/17/2022   LDLCALC 108 (H) 06/17/2022   ALT 17 06/17/2022   AST 16 06/17/2022   NA 140 12/16/2022   K 3.6 12/16/2022   CL 103 12/16/2022   CREATININE 0.79 12/16/2022   BUN 17 12/16/2022   CO2 31 12/16/2022   TSH 2.41 06/18/2021   HGBA1C 6.4 12/16/2022   MICROALBUR 0.8 12/16/2021    CT CHEST LUNG CA SCREEN LOW DOSE W/O CM  Result Date: 07/02/2022 CLINICAL DATA:  57 year old female current smoker with 33 pack-year history of smoking. Lung cancer screening examination. EXAM: CT CHEST WITHOUT CONTRAST LOW-DOSE FOR LUNG CANCER SCREENING TECHNIQUE: Multidetector CT imaging of the chest was performed following the standard protocol without IV contrast. RADIATION DOSE REDUCTION: This exam was performed according to the departmental dose-optimization program which includes automated exposure control, adjustment of the mA and/or kV according to patient size and/or use of iterative reconstruction technique. COMPARISON:  Low-dose lung cancer screening chest CT 07/01/2021. FINDINGS:  Cardiovascular: Heart size is normal. There is no significant pericardial fluid, thickening or pericardial calcification. There is aortic atherosclerosis, as well as atherosclerosis of the great vessels of the mediastinum and the coronary arteries, including calcified atherosclerotic plaque in the left main and left anterior descending coronary arteries. Mediastinum/Nodes: No pathologically enlarged mediastinal or hilar lymph nodes. Please note that accurate exclusion of hilar adenopathy is limited on noncontrast CT scans. Esophagus is unremarkable in appearance. No axillary lymphadenopathy. Lungs/Pleura: Small pulmonary nodules are again noted, largest of which is in the periphery of the right upper lobe where there  is a slightly ill-defined nodular area of architectural distortion with a volume derived mean diameter of 8.5 mm, similar to prior studies dating back to 2022. No other larger more suspicious appearing pulmonary nodules or masses are noted. No acute consolidative airspace disease. No pleural effusions. Mild diffuse bronchial wall thickening with mild centrilobular and paraseptal emphysema. Upper Abdomen: Peripherally calcified gallstone in the region of the neck of the gallbladder measuring 2.7 cm in diameter. Musculoskeletal: There are no aggressive appearing lytic or blastic lesions noted in the visualized portions of the skeleton. IMPRESSION: 1. Lung-RADS 2S, benign appearance or behavior. Continue annual screening with low-dose chest CT without contrast in 12 months. 2. The "S" modifier above refers to potentially clinically significant non lung cancer related findings. Specifically, there is aortic atherosclerosis, in addition to left main and left anterior descending coronary artery disease. Please note that although the presence of coronary artery calcium documents the presence of coronary artery disease, the severity of this disease and any potential stenosis cannot be assessed on this non-gated  CT examination. Assessment for potential risk factor modification, dietary therapy or pharmacologic therapy may be warranted, if clinically indicated. 3. Mild diffuse bronchial wall thickening with mild centrilobular and paraseptal emphysema; imaging findings suggestive of underlying COPD. 4. Cholelithiasis. Aortic Atherosclerosis (ICD10-I70.0) and Emphysema (ICD10-J43.9). Electronically Signed   By: Trudie Reed M.D.   On: 07/02/2022 10:29   Assessment & Plan:   Atherosclerosis of aorta (HCC) -     Lipoprotein A (LPA); Future  Essential hypertension, benign -     Basic metabolic panel; Future -     Urinalysis, Routine w reflex microscopic; Future -     Carvedilol; Take 1 tablet (3.125 mg total) by mouth 2 (two) times daily with a meal.  Dispense: 180 tablet; Refill: 0  Other iron deficiency anemia -     CBC with Differential/Platelet; Future  Type II diabetes mellitus with manifestations (HCC) -     Hemoglobin A1c; Future -     Basic metabolic panel; Future -     HM Diabetes Foot Exam -     Microalbumin / creatinine urine ratio; Future -     Urinalysis, Routine w reflex microscopic; Future  Hyperlipidemia with target LDL less than 100 -     Lipoprotein A (LPA); Future  Coronary artery disease involving native coronary artery of native heart without angina pectoris -     Troponin I (High Sensitivity); Future -     CT CORONARY MORPH W/CTA COR W/SCORE W/CA W/CM &/OR WO/CM; Future -     Carvedilol; Take 1 tablet (3.125 mg total) by mouth 2 (two) times daily with a meal.  Dispense: 180 tablet; Refill: 0 -     Ambulatory referral to Cardiology  Soft tissue tumor of right foot -     Ambulatory referral to Podiatry  Panlobular emphysema (HCC)  Screening mammogram for breast cancer -     Digital Screening Mammogram, Left and Right; Future  Estrogen deficiency -     DG Bone Density; Future  LVH (left ventricular hypertrophy) due to hypertensive disease, with heart failure  (HCC) -     Carvedilol; Take 1 tablet (3.125 mg total) by mouth 2 (two) times daily with a meal.  Dispense: 180 tablet; Refill: 0  High serum lipoprotein(a) -     Ambulatory referral to Cardiology     Follow-up: Return in about 6 months (around 06/18/2023).  Sanda Linger, MD

## 2022-12-16 NOTE — Patient Instructions (Signed)

## 2022-12-17 ENCOUNTER — Other Ambulatory Visit: Payer: Self-pay | Admitting: Internal Medicine

## 2022-12-17 ENCOUNTER — Encounter: Payer: Self-pay | Admitting: Internal Medicine

## 2022-12-17 DIAGNOSIS — J431 Panlobular emphysema: Secondary | ICD-10-CM | POA: Insufficient documentation

## 2022-12-17 DIAGNOSIS — D492 Neoplasm of unspecified behavior of bone, soft tissue, and skin: Secondary | ICD-10-CM | POA: Insufficient documentation

## 2022-12-17 DIAGNOSIS — I110 Hypertensive heart disease with heart failure: Secondary | ICD-10-CM

## 2022-12-17 DIAGNOSIS — I10 Essential (primary) hypertension: Secondary | ICD-10-CM

## 2022-12-20 LAB — LIPOPROTEIN A (LPA): Lipoprotein (a): 218 nmol/L — ABNORMAL HIGH (ref <75)

## 2022-12-20 MED ORDER — CARVEDILOL 3.125 MG PO TABS
3.1250 mg | ORAL_TABLET | Freq: Two times a day (BID) | ORAL | 0 refills | Status: DC
Start: 2022-12-20 — End: 2023-05-23

## 2022-12-22 DIAGNOSIS — E7841 Elevated Lipoprotein(a): Secondary | ICD-10-CM | POA: Insufficient documentation

## 2022-12-22 DIAGNOSIS — Z0001 Encounter for general adult medical examination with abnormal findings: Secondary | ICD-10-CM | POA: Insufficient documentation

## 2022-12-22 DIAGNOSIS — Z1231 Encounter for screening mammogram for malignant neoplasm of breast: Secondary | ICD-10-CM | POA: Insufficient documentation

## 2022-12-22 DIAGNOSIS — E2839 Other primary ovarian failure: Secondary | ICD-10-CM | POA: Insufficient documentation

## 2022-12-22 NOTE — Assessment & Plan Note (Signed)
Exam completed °Labs reviewed °Vaccines reviewed °Cancer screenings addressed °Patient education was given °

## 2022-12-25 ENCOUNTER — Other Ambulatory Visit: Payer: Self-pay | Admitting: Internal Medicine

## 2022-12-25 DIAGNOSIS — I110 Hypertensive heart disease with heart failure: Secondary | ICD-10-CM

## 2022-12-25 DIAGNOSIS — I10 Essential (primary) hypertension: Secondary | ICD-10-CM

## 2022-12-27 MED ORDER — INDAPAMIDE 1.25 MG PO TABS
1.2500 mg | ORAL_TABLET | Freq: Every day | ORAL | 0 refills | Status: DC
Start: 2022-12-27 — End: 2023-07-09

## 2022-12-28 ENCOUNTER — Other Ambulatory Visit: Payer: Self-pay | Admitting: Internal Medicine

## 2022-12-28 DIAGNOSIS — E118 Type 2 diabetes mellitus with unspecified complications: Secondary | ICD-10-CM

## 2022-12-29 ENCOUNTER — Telehealth (HOSPITAL_COMMUNITY): Payer: Self-pay | Admitting: *Deleted

## 2022-12-29 ENCOUNTER — Encounter (HOSPITAL_COMMUNITY): Payer: Self-pay

## 2022-12-29 MED ORDER — METOPROLOL TARTRATE 100 MG PO TABS: ORAL_TABLET | ORAL | 0 refills | Status: DC

## 2022-12-29 NOTE — Telephone Encounter (Signed)
Reaching out to patient to offer assistance regarding upcoming cardiac imaging study; pt verbalizes understanding of appt date/time, parking situation and where to check in, pre-test NPO status and medications ordered, and verified current allergies; name and call back number provided for further questions should they arise  Larey Brick RN Navigator Cardiac Imaging [facility] [phone] office [phone] cell  Patient to hold her daily carvedilol and take 100mg  metoprolol tartrate two hours prior to her cardiac CT scan.  She is aware to arrive at 12 PM.

## 2022-12-29 NOTE — Telephone Encounter (Signed)
Calling to reschedule patient's CCTA due to lack of insurance authorization. New appointment made for Aug. 9. She is requesting an earlier appt if her insurance approves the study.  Larey Brick RN Navigator Cardiac Imaging [facility] [phone] Office [phone] Cell

## 2022-12-30 ENCOUNTER — Ambulatory Visit (HOSPITAL_COMMUNITY): Admission: RE | Admit: 2022-12-30 | Payer: 59 | Source: Ambulatory Visit

## 2023-01-03 ENCOUNTER — Ambulatory Visit (INDEPENDENT_AMBULATORY_CARE_PROVIDER_SITE_OTHER): Payer: 59

## 2023-01-03 ENCOUNTER — Ambulatory Visit: Payer: 59 | Admitting: Podiatry

## 2023-01-03 ENCOUNTER — Encounter: Payer: Self-pay | Admitting: Podiatry

## 2023-01-03 VITALS — BP 135/88 | HR 87

## 2023-01-03 DIAGNOSIS — E118 Type 2 diabetes mellitus with unspecified complications: Secondary | ICD-10-CM

## 2023-01-03 DIAGNOSIS — M79675 Pain in left toe(s): Secondary | ICD-10-CM

## 2023-01-03 DIAGNOSIS — L918 Other hypertrophic disorders of the skin: Secondary | ICD-10-CM | POA: Diagnosis not present

## 2023-01-03 DIAGNOSIS — M79674 Pain in right toe(s): Secondary | ICD-10-CM | POA: Diagnosis not present

## 2023-01-03 DIAGNOSIS — M6740 Ganglion, unspecified site: Secondary | ICD-10-CM

## 2023-01-03 DIAGNOSIS — B351 Tinea unguium: Secondary | ICD-10-CM

## 2023-01-03 NOTE — Progress Notes (Signed)
  Subjective:  Patient ID: Lorraine Holt, female    DOB: December 27, 1965,  MRN: [id number]  Chief Complaint  Patient presents with   Callouses    "I have this place on the bottom of my foot." N - place on bottom  L - 2nd met right D - 20+ years O - suddenly, about the same C - grows back, tender when shaved A - none T - shave it    57 y.o. female presents with the above complaint. History confirmed with patient.  This has been present for a long time, over 20 years.  Does not particular bother her, when the skin gets thick and rough she clipped the back.  Occasionally bleeds after she does this but not on its own.  Has not changed colors.  Her nails are also thickened and causing discomfort.  She think she has nail fungus.  Her diabetes is well-controlled  Objective:  Physical Exam: warm, good capillary refill, no trophic changes or ulcerative lesions, normal DP and PT pulses, and normal sensory exam. Left Foot: dystrophic yellowed discolored nail plates with subungual debris Right Foot: dystrophic yellowed discolored nail plates with subungual debris and submetatarsal 2 skin tag benign-appearing    Radiographs: Multiple views x-ray of the right foot: no fracture, dislocation, swelling or degenerative changes noted Assessment:   1. Skin tag   2. Pain due to onychomycosis of toenails of both feet   3. Type II diabetes mellitus with manifestations (HCC)      Plan:  Patient was evaluated and treated and all questions answered.  Patient educated on diabetes. Discussed proper diabetic foot care and discussed risks and complications of disease. Educated patient in depth on reasons to return to the office immediately should he/she discover anything concerning or new on the feet. All questions answered. Discussed proper shoes as well.   Discussed the etiology and treatment options for the condition in detail with the patient.  We discussed topical and oral treatment options for mycotic  nails.  I am doubtful topical therapy would be helpful.  Recommended debridement of the nails today. Sharp and mechanical debridement performed of all painful and mycotic nails today. Nails debrided in length and thickness using a nail nipper to level of comfort. Discussed treatment options including appropriate shoe gear. Follow up as needed for painful nails.  We also discussed the presence of the skin tag on the right plantar forefoot given the chronicity of this and lack of painful symptoms I recommend continuing to monitor it she says she is able to comfortably do most of her activities and does not typically bother her.  We discussed that if becomes painful rapidly changes in size appearance or color or ulcerates, would recommend excisional biopsy, this would carry quite a bit of risk with her due to the plantar foot location, and her smoking status as well as her diabetes although it is well-controlled.  She will follow-up me as needed if she requires further treatment for this.  Return if symptoms worsen or fail to improve.

## 2023-01-03 NOTE — Patient Instructions (Addendum)
Look for urea 40% cream or ointment and apply to the thickened dry skin / calluses. This can be bought over the counter, at a pharmacy or online such as [employer].  

## 2023-01-08 ENCOUNTER — Other Ambulatory Visit: Payer: Self-pay | Admitting: Internal Medicine

## 2023-01-08 DIAGNOSIS — E118 Type 2 diabetes mellitus with unspecified complications: Secondary | ICD-10-CM

## 2023-01-11 ENCOUNTER — Other Ambulatory Visit: Payer: Self-pay | Admitting: Internal Medicine

## 2023-01-11 ENCOUNTER — Ambulatory Visit: Admission: RE | Admit: 2023-01-11 | Payer: 59 | Source: Ambulatory Visit

## 2023-01-11 DIAGNOSIS — E2839 Other primary ovarian failure: Secondary | ICD-10-CM

## 2023-01-11 DIAGNOSIS — Z1231 Encounter for screening mammogram for malignant neoplasm of breast: Secondary | ICD-10-CM

## 2023-01-11 DIAGNOSIS — I110 Hypertensive heart disease with heart failure: Secondary | ICD-10-CM

## 2023-01-11 DIAGNOSIS — D492 Neoplasm of unspecified behavior of bone, soft tissue, and skin: Secondary | ICD-10-CM

## 2023-01-11 DIAGNOSIS — I2510 Atherosclerotic heart disease of native coronary artery without angina pectoris: Secondary | ICD-10-CM

## 2023-01-11 DIAGNOSIS — D508 Other iron deficiency anemias: Secondary | ICD-10-CM

## 2023-01-11 DIAGNOSIS — I700 Atherosclerosis of aorta: Secondary | ICD-10-CM

## 2023-01-11 DIAGNOSIS — E785 Hyperlipidemia, unspecified: Secondary | ICD-10-CM

## 2023-01-11 DIAGNOSIS — E118 Type 2 diabetes mellitus with unspecified complications: Secondary | ICD-10-CM

## 2023-01-11 DIAGNOSIS — J431 Panlobular emphysema: Secondary | ICD-10-CM

## 2023-01-11 DIAGNOSIS — Z0001 Encounter for general adult medical examination with abnormal findings: Secondary | ICD-10-CM

## 2023-01-11 DIAGNOSIS — I10 Essential (primary) hypertension: Secondary | ICD-10-CM

## 2023-01-11 DIAGNOSIS — E7841 Elevated Lipoprotein(a): Secondary | ICD-10-CM

## 2023-01-13 ENCOUNTER — Ambulatory Visit (HOSPITAL_COMMUNITY): Admission: RE | Admit: 2023-01-13 | Payer: 59 | Source: Ambulatory Visit

## 2023-01-21 ENCOUNTER — Other Ambulatory Visit: Payer: Self-pay | Admitting: Internal Medicine

## 2023-01-21 DIAGNOSIS — E118 Type 2 diabetes mellitus with unspecified complications: Secondary | ICD-10-CM

## 2023-01-21 DIAGNOSIS — I5032 Chronic diastolic (congestive) heart failure: Secondary | ICD-10-CM

## 2023-01-21 DIAGNOSIS — E785 Hyperlipidemia, unspecified: Secondary | ICD-10-CM

## 2023-02-18 NOTE — Progress Notes (Signed)
Lorraine Farmer, MD Reason for referral-coronary artery disease  HPI: 57 year old female for evaluation of coronary artery disease at request of of Sanda Linger, MD.  Monitor May 2018 showed normal sinus rhythm.  Echocardiogram July 2019 showed normal LV function, grade 1 diastolic dysfunction.  ABIs December 2020 normal.  Chest CT January 2024 showed aortic atherosclerosis, calcification in the left main and LAD.  Laboratories July 2024 showed creatinine 0.79, hemoglobin 14.7, LP(a) 218.  Cardiology asked to evaluate.  Patient has dyspnea with more vigorous activities but not routine activities.  No orthopnea, PND, pedal edema, chest pain or syncope.  She does have pain in her legs.  She notices this at night and while sitting.  When she stands and begins moving her the pain in her knees improves.  However she does describe some pain in her calfs with ambulation.  Cardiology now asked to evaluate.  Current Outpatient Medications  Medication Sig Dispense Refill   blood glucose meter kit and supplies Dispense based on patient and insurance preference. Use up to two times daily as directed. (FOR ICD-10 E10.9, E11.9). 1 each 0   carvedilol (COREG) 3.125 MG tablet Take 1 tablet (3.125 mg total) by mouth 2 (two) times daily with a meal. 180 tablet 0   docusate sodium (COLACE) 50 MG capsule Take 50 mg by mouth daily.     indapamide (LOZOL) 1.25 MG tablet Take 1 tablet (1.25 mg total) by mouth daily. 90 tablet 0   JARDIANCE 25 MG TABS tablet Take 1 tablet by mouth once daily 90 tablet 0   lamoTRIgine (LAMICTAL) 25 MG tablet Take 2 tablets (50 mg total) by mouth 2 (two) times daily. 120 tablet 11   metFORMIN (GLUCOPHAGE-XR) 750 MG 24 hr tablet TAKE 2 TABLETS BY MOUTH ONCE DAILY WITH BREAKFAST 180 tablet 0   pravastatin (PRAVACHOL) 40 MG tablet Take 1 tablet by mouth once daily 90 tablet 0   thiamine (VITAMIN B-1) 50 MG tablet Take 1 tablet (50 mg total) by mouth daily. 90 tablet 1   TRUEPLUS  LANCETS 30G MISC USE TWICE DAILY 100 each 11   TRULICITY 0.75 MG/0.5ML SOPN INJECT 0.75 MG INTO THE SKIN ONCE A WEEK 12 mL 0   metoprolol tartrate (LOPRESSOR) 100 MG tablet Take tablet (100mg ) by mouth TWO hours prior to your cardiac CT scan. (Patient not taking: Reported on 03/01/2023) 1 tablet 0   No current facility-administered medications for this visit.    Allergies  Allergen Reactions   Clindamycin/Lincomycin Swelling    Tongue swelled   Penicillins Swelling and Rash   Amlodipine     Constipation, dizziness     Past Medical History:  Diagnosis Date   Anemia    history age 50'S   Arthritis    [employer] 02/24/2020   Bronchitis    Carpal tunnel syndrome of right wrist    Diabetes mellitus without complication (HCC)    [employer] 02/24/2020   Hyperlipidemia    Hypertension    Obesity    Peripheral vascular disease (HCC)    undiagnosis, but complains of numbness in legs   Shortness of breath dyspnea    with exertion   Sleep apnea    [employer] 02/24/2020   Tooth abscess 09/30/2014   tooth was extracted    Past Surgical History:  Procedure Laterality Date   ABDOMINAL HYSTERECTOMY  09/2015   BILATERAL SALPINGECTOMY Right 09/17/2014   Procedure: RIGHT SALPINGECTOMY;  Surgeon: Willodean Rosenthal, MD;  Location: [facility];  Service:  Gynecology;  Laterality: Right;   BREAST BIOPSY Left 03/2014   COLONOSCOPY  04/2014   DILATION AND CURETTAGE OF UTERUS     ROBOTIC ASSISTED TOTAL HYSTERECTOMY N/A 09/17/2014   Procedure: ROBOTIC ASSISTED TOTAL HYSTERECTOMY;  Surgeon: Willodean Rosenthal, MD;  Location: [facility];  Service: Gynecology;  Laterality: N/A;   TOOTH EXTRACTION  09/30/2014   TUBAL LIGATION      Social History   Socioeconomic History   Marital status: Married    Spouse name: Not on file   Number of children: 4   Years of education: Not on file   Highest education level: Not on file  Occupational History   Occupation: [employer]  Tobacco Use    Smoking status: Every Day    Current packs/day: 0.50    Average packs/day: 0.5 packs/day for 20.0 years (10.0 ttl pk-yrs)    Types: Cigarettes   Smokeless tobacco: Never   Tobacco comments:    03/01/2023 patient smokes 5-6 cigarettes daily  Substance and Sexual Activity   Alcohol use: No   Drug use: No   Sexual activity: Never    Birth control/protection: Surgical  Other Topics Concern   Not on file  Social History Narrative   Not on file   Social Determinants of Health   Financial Resource Strain: Not on file  Food Insecurity: Not on file  Transportation Needs: Not on file  Physical Activity: Not on file  Stress: Not on file  Social Connections: Not on file  Intimate Partner Violence: Not on file    Family History  Problem Relation Age of Onset   Hypertension Mother    Diabetes Mother    Breast cancer Mother    Diabetes Maternal Aunt    Breast cancer Maternal Aunt    Breast cancer Maternal Aunt    Diabetes Maternal Uncle    COPD Neg Hx    Early death Neg Hx    Heart disease Neg Hx    Hyperlipidemia Neg Hx    Kidney disease Neg Hx    Stroke Neg Hx     ROS: no fevers or chills, productive cough, hemoptysis, dysphasia, odynophagia, melena, hematochezia, dysuria, hematuria, rash, seizure activity, orthopnea, PND, pedal edema, claudication. Remaining systems are negative.  Physical Exam:   Blood pressure 126/82, pulse 95, height 5\' 11"  (1.803 m), weight (!) 305 lb (138.3 kg), last menstrual period 08/08/2014.  General:  Well developed/well nourished in NAD Skin warm/dry Patient not depressed No peripheral clubbing Back-normal HEENT-normal/normal eyelids Neck supple/normal carotid upstroke bilaterally; no bruits; no JVD; no thyromegaly chest - CTA/ normal expansion CV - RRR/normal S1 and S2; no murmurs, rubs or gallops;  PMI nondisplaced Abdomen -NT/ND, no HSM, no mass, + bowel sounds, no bruit 2+ femoral pulses, no bruits Ext-no edema, chords, 2+  DP Neuro-grossly nonfocal  EKG Interpretation Date/Time:  Tuesday March 01 2023 13:44:00 EDT Ventricular Rate:  95 PR Interval:  180 QRS Duration:  78 QT Interval:  366 QTC Calculation: 459 R Axis:   2  Text Interpretation: Normal sinus rhythm Low voltage QRS When compared with ECG of 29-Dec-2011 11:58, Borderline criteria for Inferior infarct are now Present Confirmed by Olga Millers ([id number]) on 03/01/2023 1:48:30 PM    A/P  1 coronary calcification-patient noted to have calcium in the left main and the LAD on previous CT scan that was a screen for smoking/lung cancer screening.  We will arrange a calcium score for full evaluation.  Note she is not having significant  symptoms.  Continue aspirin and statin.           2 hyperlipidemia-LP(a) elevated.  Will continue pravastatin.  However will refer to lipid clinic for PCSK9 inhibitor given elevation in LP(a).  3 hypertension-blood pressure controlled.  Continue present medical regimen.  4 sleep apnea-she will follow-up with primary care for this issue.  She did not tolerate CPAP in the past.  Hopefully there will be other options.  5 tobacco abuse-we discussed the importance of discontinuing.  6 morbid obesity-patient is working on losing weight.  She has lost 22 pounds recently and will continue efforts with diet, exercise.  7 leg pain-patient has good pulses distally and symptoms are not completely consistent with peripheral vascular disease.  However we will arrange ABIs with Doppler to screen.  Olga Millers, MD

## 2023-02-28 ENCOUNTER — Other Ambulatory Visit: Payer: Self-pay | Admitting: Internal Medicine

## 2023-02-28 DIAGNOSIS — E118 Type 2 diabetes mellitus with unspecified complications: Secondary | ICD-10-CM

## 2023-03-01 ENCOUNTER — Encounter: Payer: Self-pay | Admitting: Cardiology

## 2023-03-01 ENCOUNTER — Ambulatory Visit: Payer: 59 | Attending: Cardiology | Admitting: Cardiology

## 2023-03-01 VITALS — BP 126/82 | HR 95 | Ht 71.0 in | Wt 305.0 lb

## 2023-03-01 DIAGNOSIS — I10 Essential (primary) hypertension: Secondary | ICD-10-CM

## 2023-03-01 DIAGNOSIS — I2510 Atherosclerotic heart disease of native coronary artery without angina pectoris: Secondary | ICD-10-CM | POA: Diagnosis not present

## 2023-03-01 DIAGNOSIS — I739 Peripheral vascular disease, unspecified: Secondary | ICD-10-CM

## 2023-03-01 DIAGNOSIS — E785 Hyperlipidemia, unspecified: Secondary | ICD-10-CM

## 2023-03-01 MED ORDER — METFORMIN HCL ER 750 MG PO TB24
1500.0000 mg | ORAL_TABLET | Freq: Every day | ORAL | 0 refills | Status: DC
Start: 2023-03-01 — End: 2023-08-11

## 2023-03-01 NOTE — Patient Instructions (Signed)
    Testing/Procedures:  CORONARY CALCIUM SCORING CT SCAN AT THE [facility]   Your physician has requested that you have an ankle brachial index (ABI). During this test an ultrasound and blood pressure cuff are used to evaluate the arteries that supply the arms and legs with blood. Allow thirty minutes for this exam. There are no restrictions or special instructions. [facility]   Follow-Up: At [facility], you and your health needs are our priority.  As part of our continuing mission to provide you with exceptional heart care, we have created designated Provider Care Teams.  These Care Teams include your primary Cardiologist (physician) and Advanced Practice Providers (APPs -  Physician Assistants and Nurse Practitioners) who all work together to provide you with the care you need, when you need it.  We recommend signing up for the patient portal called "MyChart".  Sign up information is provided on this After Visit Summary.  MyChart is used to connect with patients for Virtual Visits (Telemedicine).  Patients are able to view lab/test results, encounter notes, upcoming appointments, etc.  Non-urgent messages can be sent to your provider as well.   To learn more about what you can do with MyChart, go to [email or web address].    Your next appointment:   12 month(s)  Provider:   Olga Millers MD

## 2023-03-02 ENCOUNTER — Encounter: Payer: Self-pay | Admitting: Internal Medicine

## 2023-03-07 ENCOUNTER — Ambulatory Visit (HOSPITAL_COMMUNITY)
Admission: RE | Admit: 2023-03-07 | Discharge: 2023-03-07 | Disposition: A | Discharge status: Home / Self Care | Payer: 59 | Source: Ambulatory Visit | Attending: Internal Medicine | Admitting: Internal Medicine

## 2023-03-07 DIAGNOSIS — I739 Peripheral vascular disease, unspecified: Secondary | ICD-10-CM | POA: Insufficient documentation

## 2023-03-07 LAB — VAS US ABI WITH/WO TBI
Left ABI: 1.15
Right ABI: 1.18

## 2023-03-09 ENCOUNTER — Encounter: Payer: Self-pay | Admitting: *Deleted

## 2023-03-10 ENCOUNTER — Ambulatory Visit: Payer: 59

## 2023-03-10 ENCOUNTER — Encounter: Payer: Self-pay | Admitting: Internal Medicine

## 2023-03-10 ENCOUNTER — Ambulatory Visit: Payer: 59 | Attending: Internal Medicine | Admitting: Student

## 2023-03-10 ENCOUNTER — Encounter: Payer: Self-pay | Admitting: Student

## 2023-03-10 DIAGNOSIS — E785 Hyperlipidemia, unspecified: Secondary | ICD-10-CM

## 2023-03-10 NOTE — Assessment & Plan Note (Signed)
Assessment:  LDL goal: < 55 mg/dl last LDLc  161 mg/dl (  /09) Tolerates moderate intensity statins well without any side effects  Risk factors - CAD, HTN, OSA, T2DM, obesity,tobacco abuse, elevated lipoprotein (a)218 Discussed next potential options (PCSK-9 inhibitors, bempedoic acid and inclisiran); cost, dosing efficacy, side effects  Given risk factors will consider adding PCSK9i to max tolerated statin  Plan: Continue taking current medications (pravastatin 40 mg ) Will apply for PA for PCSK9i; will inform patient upon approval  Lipid lab due in 2-3 months after starting [facility]

## 2023-03-10 NOTE — Assessment & Plan Note (Deleted)
Assessment:  LDL goal: <55 mg/dl last LDLc 113 mg/dl (10 /2023) Currently not on any lipid lowering agent   Unable to tolerate  moderate intensity and low intensity statinsand ezetimibe - severe muscle cramps affects mobility Discussed next potential options (PCSK-9 inhibitors, bempedoic acid and inclisiran); cost, dosing efficacy, side effects  Given the risk factors and patient's preference initiating PCSK9i if covered by insurance   Plan: Will apply for PA for PCSK9i; will inform patient upon approval  Lipid lab due in 2-3 months after starting [facility]

## 2023-03-10 NOTE — Progress Notes (Deleted)
Patient ID: Lorraine Holt                 DOB: [birth year]                    MRN: [id number]      HPI: Lorraine Holt is a 57 y.o. female patient referred to lipid clinic by [facility]. PMH is significant for LVH,chronic diastolic heart failure,CAD, HTN, OSA, T2DM, obesity,tobacco abuse, elevated lipoprotein (a)218.   Reviewed options for lowering LDL cholesterol, including ezetimibe, PCSK-9 inhibitors, bempedoic acid and inclisiran.  Discussed mechanisms of action, dosing, side effects and potential decreases in LDL cholesterol.  Also reviewed cost information and potential options for patient assistance.  Current Medications: Pravastatin 40 mg daily  Intolerances:  Risk Factors: CAD, HTN, OSA, T2DM, obesity,tobacco abuse, elevated lipoprotein (a)218 LDL goal: <55 mg /dl  Last Lab:  TC 829,FAO:13, LDLc 108, TG 112- while on pravastatin 40 mg daily   Diet:   Exercise:   Family History:   Social History:   Labs: Lipid Panel     Component Value Date/Time   CHOL 182 06/17/2022 1144   CHOL 177 02/29/2020 1115   TRIG 112.0 06/17/2022 1144   HDL 51.00 06/17/2022 1144   HDL 48 02/29/2020 1115   CHOLHDL 4 06/17/2022 1144   VLDL 22.4 06/17/2022 1144   LDLCALC 108 (H) 06/17/2022 1144   LDLCALC 112 (H) 02/29/2020 1115   LABVLDL 17 02/29/2020 1115    Past Medical History:  Diagnosis Date   Anemia    history age 55'S   Arthritis    Phreesia 02/24/2020   Bronchitis    Carpal tunnel syndrome of right wrist    Diabetes mellitus without complication (HCC)    Phreesia 02/24/2020   Hyperlipidemia    Hypertension    Obesity    Peripheral vascular disease (HCC)    undiagnosis, but complains of numbness in legs   Shortness of breath dyspnea    with exertion   Sleep apnea    Phreesia 02/24/2020   Tooth abscess 09/30/2014   tooth was extracted    Current Outpatient Medications on File Prior to Visit  Medication Sig Dispense Refill   blood glucose meter kit and supplies Dispense  based on patient and insurance preference. Use up to two times daily as directed. (FOR ICD-10 E10.9, E11.9). 1 each 0   carvedilol (COREG) 3.125 MG tablet Take 1 tablet (3.125 mg total) by mouth 2 (two) times daily with a meal. 180 tablet 0   docusate sodium (COLACE) 50 MG capsule Take 50 mg by mouth daily.     indapamide (LOZOL) 1.25 MG tablet Take 1 tablet (1.25 mg total) by mouth daily. 90 tablet 0   JARDIANCE 25 MG TABS tablet Take 1 tablet by mouth once daily 90 tablet 0   lamoTRIgine (LAMICTAL) 25 MG tablet Take 2 tablets (50 mg total) by mouth 2 (two) times daily. 120 tablet 11   metFORMIN (GLUCOPHAGE-XR) 750 MG 24 hr tablet Take 2 tablets (1,500 mg total) by mouth daily with breakfast. 180 tablet 0   metoprolol tartrate (LOPRESSOR) 100 MG tablet Take tablet (100mg ) by mouth TWO hours prior to your cardiac CT scan. (Patient not taking: Reported on 03/01/2023) 1 tablet 0   pravastatin (PRAVACHOL) 40 MG tablet Take 1 tablet by mouth once daily 90 tablet 0   thiamine (VITAMIN B-1) 50 MG tablet Take 1 tablet (50 mg total) by mouth daily. 90 tablet 1   TRUEPLUS  LANCETS 30G MISC USE TWICE DAILY 100 each 11   TRULICITY 0.75 MG/0.5ML SOPN INJECT 0.75 MG INTO THE SKIN ONCE A WEEK 12 mL 0   No current facility-administered medications on file prior to visit.    Allergies  Allergen Reactions   Clindamycin/Lincomycin Swelling    Tongue swelled   Penicillins Swelling and Rash   Amlodipine     Constipation, dizziness    Assessment/Plan:  1. Hyperlipidemia -  No problems updated. No problem-specific Assessment & Plan notes found for this encounter.    Thank you,  Carmela Hurt, Pharm.D [facility] A Division of [facility] [address] [place], [place] [address]  Phone: [phone]; Fax: [phone]

## 2023-03-10 NOTE — Progress Notes (Signed)
Patient ID: Lorraine Holt                 DOB: 02-23-1966                    MRN: [id number]      HPI: Lorraine Holt is a 57 y.o. female patient referred to lipid clinic by [facility]. PMH is significant for LVH,chronic diastolic heart failure,CAD, HTN, OSA, T2DM, obesity,tobacco abuse, elevated lipoprotein (a)218.   Reviewed options for lowering LDL cholesterol, including ezetimibe, PCSK-9 inhibitors, bempedoic acid and inclisiran.  Discussed mechanisms of action, dosing, side effects and potential decreases in LDL cholesterol.  Also reviewed cost information and potential options for patient assistance.  Current Medications: Pravastatin 40 mg daily  Intolerances: none  Risk Factors: CAD, HTN, OSA, T2DM, obesity,tobacco abuse, elevated lipoprotein (a)218 LDL goal: <55 mg /dl  Last Lab:  TC 409,WJX:91, LDLc 108, TG 112- while on pravastatin 40 mg daily   Diet: low fat, low carb  Eats lots of veg an grilled chicken  Eat out once a week 2 meals  Exercise: walks a lot at work,   Family History:  Relation Problem Comments  Mother (Alive) Breast cancer   Diabetes   Hypertension 2 stroke age 66      Father (Alive) Heart issues, MI at age 68   Maternal Aunt (Alive) Breast cancer   Diabetes     Maternal Aunt Breast cancer     Maternal Uncle Diabetes     Social History:  Alcohol: none  Smoking: 5 cig per day - will work on cutting down   Labs: Lipid Panel     Component Value Date/Time   CHOL 182 06/17/2022 1144   CHOL 177 02/29/2020 1115   TRIG 112.0 06/17/2022 1144   HDL 51.00 06/17/2022 1144   HDL 48 02/29/2020 1115   CHOLHDL 4 06/17/2022 1144   VLDL 22.4 06/17/2022 1144   LDLCALC 108 (H) 06/17/2022 1144   LDLCALC 112 (H) 02/29/2020 1115   LABVLDL 17 02/29/2020 1115    Past Medical History:  Diagnosis Date   Anemia    history age 79'S   Arthritis    Phreesia 02/24/2020   Bronchitis    Carpal tunnel syndrome of right wrist    Diabetes mellitus without  complication (HCC)    Phreesia 02/24/2020   Hyperlipidemia    Hypertension    Obesity    Peripheral vascular disease (HCC)    undiagnosis, but complains of numbness in legs   Shortness of breath dyspnea    with exertion   Sleep apnea    Phreesia 02/24/2020   Tooth abscess 09/30/2014   tooth was extracted    Current Outpatient Medications on File Prior to Visit  Medication Sig Dispense Refill   blood glucose meter kit and supplies Dispense based on patient and insurance preference. Use up to two times daily as directed. (FOR ICD-10 E10.9, E11.9). 1 each 0   carvedilol (COREG) 3.125 MG tablet Take 1 tablet (3.125 mg total) by mouth 2 (two) times daily with a meal. 180 tablet 0   docusate sodium (COLACE) 50 MG capsule Take 50 mg by mouth daily.     indapamide (LOZOL) 1.25 MG tablet Take 1 tablet (1.25 mg total) by mouth daily. 90 tablet 0   JARDIANCE 25 MG TABS tablet Take 1 tablet by mouth once daily 90 tablet 0   lamoTRIgine (LAMICTAL) 25 MG tablet Take 2 tablets (50 mg total) by mouth 2 (  two) times daily. 120 tablet 11   metFORMIN (GLUCOPHAGE-XR) 750 MG 24 hr tablet Take 2 tablets (1,500 mg total) by mouth daily with breakfast. 180 tablet 0   metoprolol tartrate (LOPRESSOR) 100 MG tablet Take tablet (100mg ) by mouth TWO hours prior to your cardiac CT scan. (Patient not taking: Reported on 03/01/2023) 1 tablet 0   pravastatin (PRAVACHOL) 40 MG tablet Take 1 tablet by mouth once daily 90 tablet 0   thiamine (VITAMIN B-1) 50 MG tablet Take 1 tablet (50 mg total) by mouth daily. 90 tablet 1   TRUEPLUS LANCETS 30G MISC USE TWICE DAILY 100 each 11   TRULICITY 0.75 MG/0.5ML SOPN INJECT 0.75 MG INTO THE SKIN ONCE A WEEK 12 mL 0   No current facility-administered medications on file prior to visit.    Allergies  Allergen Reactions   Clindamycin/Lincomycin Swelling    Tongue swelled   Penicillins Swelling and Rash   Amlodipine     Constipation, dizziness    Assessment/Plan:  1.  Hyperlipidemia -  Problem  Hyperlipidemia Ldl Goal <55   The 10-year ASCVD risk score (Arnett DK, et al., 2019) is: 24.3%   Values used to calculate the score:     Age: 30 years     Sex: Female     Is Non-Hispanic African American: Yes     Diabetic: Yes     Tobacco smoker: Yes     Systolic Blood Pressure: 130 mmHg     Is BP treated: Yes     HDL Cholesterol: 48.7 mg/dL     Total Cholesterol: 161 mg/dL    No problem-specific Assessment & Plan notes found for this encounter.    Thank you,  Carmela Hurt, Pharm.D [facility] A Division of [place] [facility] [address], [place], [place] [address]  Phone: [phone]; Fax: [phone]

## 2023-03-10 NOTE — Patient Instructions (Signed)
Your Results:             Your most recent labs Goal  Total Cholesterol 182 < 200  Triglycerides 112 < 150  HDL (happy/good cholesterol) 51 > 40  LDL (lousy/bad cholesterol 108 < 55   Medication changes: We will start the process to get PCSK9i(Repatha or Praluent  covered by your insurance.  Once the prior authorization is complete, we will call you to let you know and confirm pharmacy information.     Praluent is a cholesterol medication that improved your body's ability to get rid of "bad cholesterol" known as LDL. It can lower your LDL up to 60%. It is an injection that is given under the skin every 2 weeks. The most common side effects of Praluent include runny nose, symptoms of the common cold, rarely flu or flu-like symptoms, back/muscle pain in about 3-4% of the patients, and redness, pain, or bruising at the injection site.    Repatha is a cholesterol medication that improved your body's ability to get rid of "bad cholesterol" known as LDL. It can lower your LDL up to 60%! It is an injection that is given under the skin every 2 weeks. The most common side effects of Repatha include runny nose, symptoms of the common cold, rarely flu or flu-like symptoms, back/muscle pain in about 3-4% of the patients, and redness, pain, or bruising at the injection site.   Lab orders: We want to repeat labs after 2-3 months.  We will send you a lab order to remind you once we get closer to that time.

## 2023-03-11 ENCOUNTER — Other Ambulatory Visit: Payer: Self-pay | Admitting: Internal Medicine

## 2023-03-11 DIAGNOSIS — E118 Type 2 diabetes mellitus with unspecified complications: Secondary | ICD-10-CM

## 2023-03-11 MED ORDER — TIRZEPATIDE 2.5 MG/0.5ML ~~LOC~~ SOAJ: 2.5000 mg | SUBCUTANEOUS | 0 refills | Status: DC

## 2023-03-17 ENCOUNTER — Other Ambulatory Visit (HOSPITAL_COMMUNITY): Payer: Self-pay

## 2023-03-17 ENCOUNTER — Telehealth: Payer: Self-pay

## 2023-03-17 NOTE — Telephone Encounter (Signed)
Pharmacy Patient Advocate Encounter   Received notification from Physician's Office that prior authorization for [facility] is required/requested.   Insurance verification completed.   The patient is insured through [facility] .   Per test claim: PA required; PA submitted to [facility] via CoverMyMeds Key/confirmation #/EOC Key: [id number] Status is pending

## 2023-03-18 ENCOUNTER — Other Ambulatory Visit (HOSPITAL_COMMUNITY): Payer: Self-pay

## 2023-03-21 NOTE — Telephone Encounter (Signed)
Pharmacy Patient Advocate Encounter  Received notification from [facility] that Prior Authorization for [facility] has been DENIED.  Full denial letter will be uploaded to the media tab. See denial reason below.   PA #/Case ID/Reference #: [id number]   DENIAL REASON: Your plan only covers this drug when you meet one of these options: A) You have tried other products your plan covers (preferred products), and they did not work well for you, or B) Your doctor gives [place] a medical reason you cannot take those other products. For your plan, you may need to try up to three preferred products. We have denied your request because you do not meet any of these conditions. We reviewed the information we had. Your request has been denied. Your doctor can send [place] any new or missing information for [place] to review. The preferred products for your plan are: Trulicity, Victoza

## 2023-03-24 ENCOUNTER — Ambulatory Visit (HOSPITAL_BASED_OUTPATIENT_CLINIC_OR_DEPARTMENT_OTHER)
Admission: RE | Admit: 2023-03-24 | Discharge: 2023-03-24 | Disposition: A | Discharge status: Home / Self Care | Payer: 59 | Source: Ambulatory Visit | Attending: Cardiology | Admitting: Cardiology

## 2023-03-24 DIAGNOSIS — I2510 Atherosclerotic heart disease of native coronary artery without angina pectoris: Secondary | ICD-10-CM | POA: Insufficient documentation

## 2023-03-28 ENCOUNTER — Telehealth: Payer: Self-pay | Admitting: Cardiology

## 2023-03-28 NOTE — Telephone Encounter (Signed)
Pt called in asking if PharmD has started her application for new medications. She states they were waiting on CT cardiac score but the results are in now.

## 2023-03-29 ENCOUNTER — Telehealth: Payer: Self-pay | Admitting: Pharmacist

## 2023-03-29 ENCOUNTER — Other Ambulatory Visit (HOSPITAL_COMMUNITY): Payer: Self-pay

## 2023-03-29 ENCOUNTER — Telehealth: Payer: Self-pay | Admitting: Pharmacy Technician

## 2023-03-29 NOTE — Telephone Encounter (Signed)
PCSK9i PA request sent to the pool.  Call patient to inform, N/A LVM

## 2023-03-29 NOTE — Telephone Encounter (Signed)
Pharmacy Patient Advocate Encounter   Received notification from Pt Calls Messages that prior authorization for repatha is required/requested.   Insurance verification completed.   The patient is insured through [facility] .   Per test claim: PA required; PA submitted to [facility] via CoverMyMeds Key/confirmation #/EOC [id number] Status is pending

## 2023-03-30 ENCOUNTER — Other Ambulatory Visit (HOSPITAL_COMMUNITY): Payer: Self-pay

## 2023-03-30 NOTE — Telephone Encounter (Signed)
Pharmacy Patient Advocate Encounter  Received notification from [facility] that Prior Authorization for repatha has been DENIED.  See denial reason below. No denial letter attached in CMM. Will attache denial letter to Media tab once received.    PA #/Case ID/Reference #: [id number] MA

## 2023-03-31 ENCOUNTER — Telehealth: Payer: Self-pay | Admitting: Pharmacist

## 2023-03-31 DIAGNOSIS — E785 Hyperlipidemia, unspecified: Secondary | ICD-10-CM

## 2023-03-31 NOTE — Telephone Encounter (Signed)
Call patient to discuss denial reason. Her labs are from Jan,2024 will get update fasting lipid lab, patient is in agreement to get it done in 04/01/2023. Will submit appeal with updated lab early next week

## 2023-04-01 ENCOUNTER — Other Ambulatory Visit: Payer: Self-pay

## 2023-04-01 DIAGNOSIS — E785 Hyperlipidemia, unspecified: Secondary | ICD-10-CM

## 2023-04-01 LAB — LIPID PANEL
Chol/HDL Ratio: 3.7 ratio (ref 0.0–4.4)
Cholesterol, Total: 190 mg/dL (ref 100–199)
HDL: 51 mg/dL (ref >39)
LDL Chol Calc (NIH): 116 mg/dL — ABNORMAL HIGH (ref 0–99)
Triglycerides: 130 mg/dL (ref 0–149)
VLDL Cholesterol Cal: 23 mg/dL (ref 5–40)

## 2023-04-04 ENCOUNTER — Other Ambulatory Visit (HOSPITAL_COMMUNITY): Payer: Self-pay

## 2023-04-04 NOTE — Telephone Encounter (Signed)
PA denied, recent lab ordered and will appeal the denial.

## 2023-04-04 NOTE — Telephone Encounter (Signed)
Patient made aware of Mounjaro denial. Patient has to try and fail their preferred GLP1 first ( Trulicity and Victoza)

## 2023-04-05 ENCOUNTER — Other Ambulatory Visit: Payer: Self-pay | Admitting: Internal Medicine

## 2023-04-05 ENCOUNTER — Other Ambulatory Visit (HOSPITAL_COMMUNITY): Payer: Self-pay

## 2023-04-05 DIAGNOSIS — E118 Type 2 diabetes mellitus with unspecified complications: Secondary | ICD-10-CM

## 2023-04-05 MED ORDER — ROSUVASTATIN CALCIUM 40 MG PO TABS: 40.0000 mg | ORAL_TABLET | Freq: Every day | ORAL | 3 refills | Status: DC

## 2023-04-05 MED ORDER — EZETIMIBE 10 MG PO TABS: 10.0000 mg | ORAL_TABLET | Freq: Every day | ORAL | 3 refills | Status: DC

## 2023-04-05 MED ORDER — TRULICITY 0.75 MG/0.5ML ~~LOC~~ SOAJ
0.7500 mg | SUBCUTANEOUS | 0 refills | Status: DC
Start: 2023-04-05 — End: 2023-07-09

## 2023-04-05 NOTE — Telephone Encounter (Deleted)
.  ops

## 2023-04-05 NOTE — Telephone Encounter (Signed)
See other encounter patient will be trying Crestor and Zetia

## 2023-04-05 NOTE — Addendum Note (Signed)
Addended by: Tylene Fantasia on: 04/05/2023 01:33 PM   Modules accepted: Orders

## 2023-04-05 NOTE — Telephone Encounter (Signed)
See other encounter from 03/29/2023 for Repatha denial and therapy modification

## 2023-04-05 NOTE — Telephone Encounter (Signed)
Spoke to patient about denial. One os the PCSK9i coverage criteria trial and failure of high intensity statin that is  listed in CMM (rosuvastatin  and atorvastatin)plus zetia Patient is in agreement to try Crestor and Zetia.  Advised patient to start Crestor 40 mg daily for 2 weeks then add Zetia 10 mg daily to the regimen.

## 2023-04-05 NOTE — Telephone Encounter (Signed)
Pharmacy Patient Advocate Encounter  Received notification from [facility] that Prior Authorization for repatha has been DENIED.  Full denial letter will be uploaded to the media tab. See denial reason below.   PA #/Case ID/Reference #: [id number] SG

## 2023-04-26 ENCOUNTER — Other Ambulatory Visit: Payer: Self-pay | Admitting: Internal Medicine

## 2023-04-26 DIAGNOSIS — I5032 Chronic diastolic (congestive) heart failure: Secondary | ICD-10-CM

## 2023-04-26 DIAGNOSIS — E118 Type 2 diabetes mellitus with unspecified complications: Secondary | ICD-10-CM

## 2023-04-26 DIAGNOSIS — E785 Hyperlipidemia, unspecified: Secondary | ICD-10-CM

## 2023-04-26 MED ORDER — EMPAGLIFLOZIN 25 MG PO TABS
25.0000 mg | ORAL_TABLET | Freq: Every day | ORAL | 0 refills | Status: DC
Start: 2023-04-26 — End: 2023-08-11

## 2023-05-03 ENCOUNTER — Other Ambulatory Visit (INDEPENDENT_AMBULATORY_CARE_PROVIDER_SITE_OTHER): Payer: 59

## 2023-05-03 ENCOUNTER — Telehealth: Payer: Self-pay

## 2023-05-03 ENCOUNTER — Ambulatory Visit: Payer: 59 | Admitting: Orthopaedic Surgery

## 2023-05-03 ENCOUNTER — Other Ambulatory Visit (INDEPENDENT_AMBULATORY_CARE_PROVIDER_SITE_OTHER): Payer: Self-pay

## 2023-05-03 VITALS — Ht 68.0 in | Wt 309.0 lb

## 2023-05-03 DIAGNOSIS — M25561 Pain in right knee: Secondary | ICD-10-CM

## 2023-05-03 DIAGNOSIS — G8929 Other chronic pain: Secondary | ICD-10-CM

## 2023-05-03 DIAGNOSIS — M25562 Pain in left knee: Secondary | ICD-10-CM

## 2023-05-03 DIAGNOSIS — Z6841 Body Mass Index (BMI) 40.0 and over, adult: Secondary | ICD-10-CM

## 2023-05-03 MED ORDER — METHYLPREDNISOLONE ACETATE 40 MG/ML IJ SUSP
40.0000 mg | INTRAMUSCULAR | Status: AC | PRN
Start: 2023-05-03 — End: 2023-05-03
  Administered 2023-05-03: 40 mg via INTRA_ARTICULAR

## 2023-05-03 MED ORDER — LIDOCAINE HCL 1 % IJ SOLN
2.0000 mL | INTRAMUSCULAR | Status: AC | PRN
Start: 2023-05-03 — End: 2023-05-03
  Administered 2023-05-03: 2 mL

## 2023-05-03 MED ORDER — BUPIVACAINE HCL 0.5 % IJ SOLN
2.0000 mL | INTRAMUSCULAR | Status: AC | PRN
Start: 2023-05-03 — End: 2023-05-03
  Administered 2023-05-03: 2 mL via INTRA_ARTICULAR

## 2023-05-03 NOTE — Progress Notes (Signed)
Office Visit Note   Patient: Lorraine Holt           Date of Birth: 1965/09/24           MRN: [id number] Visit Date: 05/03/2023              Requested by: Etta Grandchild, MD [address] [place],  [place] [address] PCP: Etta Grandchild, MD   Assessment & Plan: Visit Diagnoses:  1. Chronic pain of both knees   2. Body mass index 45.0-49.9, adult (HCC)     Plan: Lorraine Holt is a 57 year old female with advanced bilateral knee DJD with varus deformity.  Treatment options were explained and I strongly encouraged her to focus on weight loss and muscular strengthening but ultimately she will likely be facing a knee replacement given the severity of the arthritis.  Cortisone injections were administered today.  She is interested in viscosupplementation.  And home exercise program provided.  This patient is diagnosed with osteoarthritis of the knee(s).    Radiographs show evidence of joint space narrowing, osteophytes, subchondral sclerosis and/or subchondral cysts.  This patient has knee pain which interferes with functional and activities of daily living.    This patient has experienced inadequate response, adverse effects and/or intolerance with conservative treatments such as acetaminophen, NSAIDS, topical creams, physical therapy or regular exercise, knee bracing and/or weight loss.   This patient has experienced inadequate response or has a contraindication to intra articular steroid injections for at least 3 months.   This patient is not scheduled to have a total knee replacement within 6 months of starting treatment with viscosupplementation.  The patient meets the AMA guidelines for Morbid (severe) obesity with a BMI > 40.0 and I have recommended weight loss.  Follow-Up Instructions: No follow-ups on file.   Orders:  Orders Placed This Encounter  Procedures   XR KNEE 3 VIEW LEFT   XR KNEE 3 VIEW RIGHT   No orders of the defined types were placed in this encounter.      Procedures: Large Joint Inj: bilateral knee on 05/03/2023 5:13 PM Indications: pain Details: 22 G needle  Arthrogram: No  Medications (Right): 2 mL lidocaine 1 %; 2 mL bupivacaine 0.5 %; 40 mg methylPREDNISolone acetate 40 MG/ML Medications (Left): 2 mL lidocaine 1 %; 2 mL bupivacaine 0.5 %; 40 mg methylPREDNISolone acetate 40 MG/ML Outcome: tolerated well, no immediate complications Patient was prepped and draped in the usual sterile fashion.       Clinical Data: No additional findings.   Subjective: Chief Complaint  Patient presents with   Right Knee - Pain   Left Knee - Pain    HPI Lorraine Holt is a 57 year old female who comes in for follow-up evaluation of bilateral knee pain.  I saw her couple years ago for this and we did cortisone injections.  She states that recently her pain has gotten much worse.  She denies any injuries.  She reports posterior knee pain and swelling.  Review of Systems  Constitutional: Negative.   HENT: Negative.    Eyes: Negative.   Respiratory: Negative.    Cardiovascular: Negative.   Endocrine: Negative.   Musculoskeletal: Negative.   Neurological: Negative.   Hematological: Negative.   Psychiatric/Behavioral: Negative.    All other systems reviewed and are negative.    Objective: Vital Signs: Ht 5\' 8"  (1.727 m)   Wt (!) 309 lb (140.2 kg)   LMP 08/08/2014   BMI 46.98 kg/m   Physical Exam Vitals  and nursing note reviewed.  Constitutional:      Appearance: She is well-developed.  HENT:     Head: Atraumatic.     Nose: Nose normal.  Eyes:     Extraocular Movements: Extraocular movements intact.  Cardiovascular:     Pulses: Normal pulses.  Pulmonary:     Effort: Pulmonary effort is normal.  Abdominal:     Palpations: Abdomen is soft.  Musculoskeletal:     Cervical back: Neck supple.  Skin:    General: Skin is warm.     Capillary Refill: Capillary refill takes less than 2 seconds.  Neurological:     Mental Status: She is  alert. Mental status is at baseline.  Psychiatric:        Behavior: Behavior normal.        Thought Content: Thought content normal.        Judgment: Judgment normal.     Ortho Exam Exam of bilateral knees showed no joint effusion.  2+ patellofemoral crepitus with range of motion.  Collaterals and cruciates are stable.  Medial joint line tenderness. Specialty Comments:  No specialty comments available.  Imaging: No results found.   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   Atherosclerosis of aorta (HCC) 01/28/2021   Claudication of both lower extremities (HCC) 05/10/2019   Chronic diastolic heart failure (HCC) 05/10/2019   Mucopurulent chronic bronchitis (HCC) 05/10/2019   Visit for screening mammogram 07/18/2018   LVH (left ventricular hypertrophy) due to hypertensive disease, with heart failure (HCC) 12/28/2017   Meralgia paresthetica, bilateral lower limbs 04/19/2017   Chronic idiopathic constipation 05/25/2016   OSA (obstructive sleep apnea) 05/25/2016   Primary osteoarthritis of both knees 05/25/2016   Tobacco abuse 07/23/2015   Iron deficiency anemia 05/22/2015   Abnormal mammogram of both breasts 10/01/2014   Severe obesity (BMI >= 40) (HCC) 07/23/2014   Carpal tunnel syndrome of right wrist 06/26/2014   Hyperlipidemia LDL goal <55 08/08/2013   Cervical cancer screening 04/30/2013   Nonspecific abnormal electrocardiogram (ECG) (EKG) 07/12/2012    Type II diabetes mellitus with manifestations (HCC) 02/21/2012   Essential hypertension, benign 02/21/2012   Past Medical History:  Diagnosis Date   Anemia    history age 75'S   Arthritis    Phreesia 02/24/2020   Bronchitis    Carpal tunnel syndrome of right wrist    Diabetes mellitus without complication (HCC)    Phreesia 02/24/2020   Hyperlipidemia    Hypertension    Obesity    Peripheral vascular disease (HCC)    undiagnosis, but complains of numbness in legs   Shortness of breath dyspnea    with exertion   Sleep apnea    Phreesia 02/24/2020   Tooth abscess 09/30/2014   tooth was extracted    Family History  Problem Relation Age of Onset   Hypertension Mother    Diabetes Mother    Breast cancer Mother    Diabetes Maternal Aunt    Breast cancer Maternal Aunt    Breast cancer Maternal Aunt  Diabetes Maternal Uncle    COPD Neg Hx    Early death Neg Hx    Heart disease Neg Hx    Hyperlipidemia Neg Hx    Kidney disease Neg Hx    Stroke Neg Hx     Past Surgical History:  Procedure Laterality Date   ABDOMINAL HYSTERECTOMY  09/2015   BILATERAL SALPINGECTOMY Right 09/17/2014   Procedure: RIGHT SALPINGECTOMY;  Surgeon: Willodean Rosenthal, MD;  Location: [facility];  Service: Gynecology;  Laterality: Right;   BREAST BIOPSY Left 03/2014   COLONOSCOPY  04/2014   DILATION AND CURETTAGE OF UTERUS     ROBOTIC ASSISTED TOTAL HYSTERECTOMY N/A 09/17/2014   Procedure: ROBOTIC ASSISTED TOTAL HYSTERECTOMY;  Surgeon: Willodean Rosenthal, MD;  Location: [facility];  Service: Gynecology;  Laterality: N/A;   TOOTH EXTRACTION  09/30/2014   TUBAL LIGATION     Social History   Occupational History   Occupation: [employer]  Tobacco Use   Smoking status: Every Day    Current packs/day: 0.50    Average packs/day: 0.5 packs/day for 20.0 years (10.0 ttl pk-yrs)    Types: Cigarettes   Smokeless tobacco: Never   Tobacco comments:    03/01/2023 patient smokes 5-6  cigarettes daily  Substance and Sexual Activity   Alcohol use: No   Drug use: No   Sexual activity: Never    Birth control/protection: Surgical

## 2023-05-03 NOTE — Telephone Encounter (Signed)
Please precert for bilateral gel injections. Dr.Xu's patient. Thanks!

## 2023-05-12 ENCOUNTER — Telehealth: Payer: Self-pay | Admitting: Orthopaedic Surgery

## 2023-05-12 NOTE — Telephone Encounter (Signed)
Pt called stating Dr Roda Shutters was to send in to insurance company for bil knee injection. Please submit to insurance company and call pt when approved. Pt phone number is [phone].

## 2023-05-12 NOTE — Telephone Encounter (Signed)
VOB submitted for Monovisc, bilateral knee  

## 2023-05-12 NOTE — Telephone Encounter (Signed)
Talked with patient concerning approval for gel injection and appt. Has been scheduled.

## 2023-05-19 ENCOUNTER — Ambulatory Visit: Payer: 59 | Admitting: Orthopaedic Surgery

## 2023-05-19 ENCOUNTER — Other Ambulatory Visit: Payer: Self-pay

## 2023-05-19 ENCOUNTER — Encounter: Payer: Self-pay | Admitting: Orthopaedic Surgery

## 2023-05-19 DIAGNOSIS — M170 Bilateral primary osteoarthritis of knee: Secondary | ICD-10-CM

## 2023-05-19 MED ORDER — HYALURONAN 88 MG/4ML IX SOSY
88.0000 mg | PREFILLED_SYRINGE | INTRA_ARTICULAR | Status: AC | PRN
Start: 2023-05-19 — End: 2023-05-19
  Administered 2023-05-19: 88 mg via INTRA_ARTICULAR

## 2023-05-19 NOTE — Progress Notes (Signed)
   Procedure Note  Patient: Lorraine Holt             Date of Birth: [birth year]           MRN: [id number]             Visit Date: 05/19/2023  Procedures: Visit Diagnoses:  1. Bilateral primary osteoarthritis of knee    [name] underwent bilateral Monovisc injections today.  She tolerated this well.  Large Joint Inj: bilateral knee on 05/19/2023 8:56 AM Indications: pain Details: 22 G needle  Arthrogram: No  Medications (Right): 88 mg Hyaluronan 88 MG/4ML Medications (Left): 88 mg Hyaluronan 88 MG/4ML Outcome: tolerated well, no immediate complications Patient was prepped and draped in the usual sterile fashion.

## 2023-05-23 ENCOUNTER — Other Ambulatory Visit: Payer: Self-pay | Admitting: Internal Medicine

## 2023-05-23 DIAGNOSIS — I2510 Atherosclerotic heart disease of native coronary artery without angina pectoris: Secondary | ICD-10-CM

## 2023-05-23 DIAGNOSIS — I10 Essential (primary) hypertension: Secondary | ICD-10-CM

## 2023-05-23 DIAGNOSIS — I110 Hypertensive heart disease with heart failure: Secondary | ICD-10-CM

## 2023-05-27 ENCOUNTER — Ambulatory Visit (INDEPENDENT_AMBULATORY_CARE_PROVIDER_SITE_OTHER)
Admission: RE | Admit: 2023-05-27 | Discharge: 2023-05-27 | Disposition: A | Discharge status: Home / Self Care | Payer: 59 | Source: Ambulatory Visit | Attending: Internal Medicine | Admitting: Internal Medicine

## 2023-05-27 DIAGNOSIS — E2839 Other primary ovarian failure: Secondary | ICD-10-CM | POA: Diagnosis not present

## 2023-06-14 ENCOUNTER — Other Ambulatory Visit: Payer: Self-pay | Admitting: Internal Medicine

## 2023-06-14 DIAGNOSIS — I10 Essential (primary) hypertension: Secondary | ICD-10-CM

## 2023-06-14 DIAGNOSIS — I110 Hypertensive heart disease with heart failure: Secondary | ICD-10-CM

## 2023-06-26 ENCOUNTER — Other Ambulatory Visit: Payer: Self-pay | Admitting: Internal Medicine

## 2023-06-26 DIAGNOSIS — E118 Type 2 diabetes mellitus with unspecified complications: Secondary | ICD-10-CM

## 2023-06-27 ENCOUNTER — Encounter: Payer: Self-pay | Admitting: Orthopaedic Surgery

## 2023-06-27 ENCOUNTER — Other Ambulatory Visit: Payer: Self-pay | Admitting: Internal Medicine

## 2023-06-27 DIAGNOSIS — E118 Type 2 diabetes mellitus with unspecified complications: Secondary | ICD-10-CM

## 2023-06-27 NOTE — Telephone Encounter (Signed)
Yes that's fine 

## 2023-06-28 ENCOUNTER — Telehealth: Payer: Self-pay | Admitting: Orthopaedic Surgery

## 2023-06-28 NOTE — Telephone Encounter (Signed)
Note has been placed in MyChart.

## 2023-06-28 NOTE — Telephone Encounter (Signed)
Patient called concerning the note for jury duty. Patient asked if the note can be put in mychart?  The number to contact patient is [phone]

## 2023-07-04 ENCOUNTER — Ambulatory Visit
Admission: RE | Admit: 2023-07-04 | Discharge: 2023-07-04 | Disposition: A | Discharge status: Home / Self Care | Payer: No Typology Code available for payment source | Source: Ambulatory Visit | Attending: Family Medicine | Admitting: Family Medicine

## 2023-07-04 DIAGNOSIS — F17210 Nicotine dependence, cigarettes, uncomplicated: Secondary | ICD-10-CM

## 2023-07-04 DIAGNOSIS — Z87891 Personal history of nicotine dependence: Secondary | ICD-10-CM

## 2023-07-04 DIAGNOSIS — Z122 Encounter for screening for malignant neoplasm of respiratory organs: Secondary | ICD-10-CM

## 2023-07-09 ENCOUNTER — Other Ambulatory Visit: Payer: Self-pay | Admitting: Internal Medicine

## 2023-07-09 DIAGNOSIS — I110 Hypertensive heart disease with heart failure: Secondary | ICD-10-CM

## 2023-07-09 DIAGNOSIS — I10 Essential (primary) hypertension: Secondary | ICD-10-CM

## 2023-07-09 DIAGNOSIS — E118 Type 2 diabetes mellitus with unspecified complications: Secondary | ICD-10-CM

## 2023-07-11 ENCOUNTER — Telehealth: Payer: Self-pay | Admitting: Pharmacist

## 2023-07-11 ENCOUNTER — Other Ambulatory Visit (HOSPITAL_COMMUNITY): Payer: Self-pay

## 2023-07-11 ENCOUNTER — Telehealth: Payer: Self-pay | Admitting: Pharmacy Technician

## 2023-07-11 DIAGNOSIS — E785 Hyperlipidemia, unspecified: Secondary | ICD-10-CM

## 2023-07-11 NOTE — Telephone Encounter (Signed)
Pharmacy Patient Advocate Encounter   Received notification from Pt Calls Messages that prior authorization for Repatha SureClick 140MG /ML auto-injectors is required/requested.   Insurance verification completed.   The patient is insured through [facility] .   Per test claim: PA required; PA submitted to above mentioned insurance via CoverMyMeds Key/confirmation #/EOC [id number] Status is pending

## 2023-07-11 NOTE — Telephone Encounter (Signed)
Pharmacy Patient Advocate Encounter  Received notification from [facility] that Prior Authorization for Repatha SureClick 140MG /ML auto-injectors  has been APPROVED from 07/11/23 to 07/10/24. Ran test claim, Copay is $50.00. This test claim was processed through [facility]- copay amounts may vary at other pharmacies due to pharmacy/plan contracts, or as the patient moves through the different stages of their insurance plan.   PA #/Case ID/Reference #: [id number]

## 2023-07-12 MED ORDER — REPATHA SURECLICK 140 MG/ML ~~LOC~~ SOAJ
140.0000 mg | SUBCUTANEOUS | 2 refills | Status: DC
Start: 2023-07-12 — End: 2023-10-07

## 2023-07-12 NOTE — Addendum Note (Signed)
Addended by: Tylene Fantasia on: 07/12/2023 10:26 AM   Modules accepted: Orders

## 2023-07-12 NOTE — Telephone Encounter (Signed)
Spoke to patient, will start taking Repatha. F/u lab due Apr 15.

## 2023-07-12 NOTE — Telephone Encounter (Signed)
PA for Repatha approved 

## 2023-07-15 ENCOUNTER — Telehealth: Payer: Self-pay | Admitting: Acute Care

## 2023-07-15 ENCOUNTER — Other Ambulatory Visit: Payer: Self-pay | Admitting: Acute Care

## 2023-07-15 ENCOUNTER — Telehealth: Payer: Self-pay

## 2023-07-15 DIAGNOSIS — R918 Other nonspecific abnormal finding of lung field: Secondary | ICD-10-CM

## 2023-07-15 NOTE — Telephone Encounter (Signed)
 Received call from Tiffany regarding call report. Impression below.    IMPRESSION: 1. Lung-RADS 4B, suspicious. Additional imaging evaluation or consultation with Pulmonology or Thoracic Surgery recommended. Very slowly growing apical right upper lobe part solid 1.8 cm pulmonary nodule with 0.4 cm solid component, increased from 1.4 cm by 0.2 cm solid component on baseline 12/10/2020 CT, cannot exclude indolent/low-grade primary bronchogenic adenocarcinoma. 2. A few new clustered indistinct pulmonary nodules in the basilar right lower lobe up to 6.8 mm, favoring a mild infectious/inflammatory bronchiolitis. 3. One vessel coronary atherosclerosis. 4. Cholelithiasis. 5. Aortic Atherosclerosis (ICD10-I70.0) and Emphysema (ICD10-J43.9).   These results will be called to the ordering clinician or representative by the Radiologist Assistant, and communication documented in the PACS or [employer].     Electronically Signed   By: Selinda DELENA Blue M.D.   On: 07/14/2023 22:44

## 2023-07-15 NOTE — Telephone Encounter (Addendum)
 I have attempted to call the patient with the results of their  Low Dose CT Chest Lung cancer screening scan. There was no answer on the mobile/ home number. There was no option to leave a message on that number.  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   There was no answer. I have left a HIPPA compliant VM requesting the patient call the office for the scan results. I included the office contact information in the message. We will await their return call. If no return call we will continue to call until patient is contacted.    Pt needs PET scan and follow up with me. Thanks so much.  Please fax pcp and let them know plan.   Thanks so much   Pt. Returned the call. I have ordered the PET scan. She will need follow up with me in the office to 1 week after to review results. Thanks so much

## 2023-07-15 NOTE — Telephone Encounter (Deleted)
 Reviewed results with Margit Shelling, NP. Due to new nodule of 7.3 mm since last scan a 3 month f/u scan will be needed. Please place 3 month order and sent f/u plan and results to PCP.

## 2023-07-15 NOTE — Telephone Encounter (Signed)
 See provider note. Results reviewed by Margit Shelling, NP and she has spoken with the patient.

## 2023-07-15 NOTE — Telephone Encounter (Signed)
 Tiffany calling with call report. For CT scan.Tiffany phone number is [phone].

## 2023-07-18 NOTE — Telephone Encounter (Signed)
 Error

## 2023-07-28 ENCOUNTER — Other Ambulatory Visit (HOSPITAL_COMMUNITY): Payer: 59

## 2023-08-02 ENCOUNTER — Ambulatory Visit (HOSPITAL_COMMUNITY)
Admission: RE | Admit: 2023-08-02 | Discharge: 2023-08-02 | Disposition: A | Discharge status: Home / Self Care | Payer: 59 | Source: Ambulatory Visit | Attending: Acute Care | Admitting: Acute Care

## 2023-08-02 DIAGNOSIS — R918 Other nonspecific abnormal finding of lung field: Secondary | ICD-10-CM | POA: Diagnosis not present

## 2023-08-02 DIAGNOSIS — R911 Solitary pulmonary nodule: Secondary | ICD-10-CM | POA: Diagnosis not present

## 2023-08-02 LAB — GLUCOSE, CAPILLARY: Glucose-Capillary: 108 mg/dL — ABNORMAL HIGH (ref 70–99)

## 2023-08-02 MED ORDER — FLUDEOXYGLUCOSE F - 18 (FDG) INJECTION
15.4200 | Freq: Once | INTRAVENOUS | Status: AC
  Administered 2023-08-02: 15.42 via INTRAVENOUS

## 2023-08-04 ENCOUNTER — Other Ambulatory Visit: Payer: Self-pay | Admitting: Internal Medicine

## 2023-08-04 DIAGNOSIS — E118 Type 2 diabetes mellitus with unspecified complications: Secondary | ICD-10-CM

## 2023-08-04 DIAGNOSIS — I5032 Chronic diastolic (congestive) heart failure: Secondary | ICD-10-CM

## 2023-08-10 DIAGNOSIS — D6869 Other thrombophilia: Secondary | ICD-10-CM | POA: Diagnosis not present

## 2023-08-10 DIAGNOSIS — E785 Hyperlipidemia, unspecified: Secondary | ICD-10-CM | POA: Diagnosis not present

## 2023-08-10 DIAGNOSIS — Z833 Family history of diabetes mellitus: Secondary | ICD-10-CM | POA: Diagnosis not present

## 2023-08-10 DIAGNOSIS — E1136 Type 2 diabetes mellitus with diabetic cataract: Secondary | ICD-10-CM | POA: Diagnosis not present

## 2023-08-10 DIAGNOSIS — M1990 Unspecified osteoarthritis, unspecified site: Secondary | ICD-10-CM | POA: Diagnosis not present

## 2023-08-10 DIAGNOSIS — E1151 Type 2 diabetes mellitus with diabetic peripheral angiopathy without gangrene: Secondary | ICD-10-CM | POA: Diagnosis not present

## 2023-08-10 DIAGNOSIS — I10 Essential (primary) hypertension: Secondary | ICD-10-CM | POA: Diagnosis not present

## 2023-08-10 DIAGNOSIS — E1142 Type 2 diabetes mellitus with diabetic polyneuropathy: Secondary | ICD-10-CM | POA: Diagnosis not present

## 2023-08-10 DIAGNOSIS — Z8249 Family history of ischemic heart disease and other diseases of the circulatory system: Secondary | ICD-10-CM | POA: Diagnosis not present

## 2023-08-10 DIAGNOSIS — I4891 Unspecified atrial fibrillation: Secondary | ICD-10-CM | POA: Diagnosis not present

## 2023-08-10 DIAGNOSIS — Z87891 Personal history of nicotine dependence: Secondary | ICD-10-CM | POA: Diagnosis not present

## 2023-08-10 DIAGNOSIS — Z7982 Long term (current) use of aspirin: Secondary | ICD-10-CM | POA: Diagnosis not present

## 2023-08-11 ENCOUNTER — Ambulatory Visit (INDEPENDENT_AMBULATORY_CARE_PROVIDER_SITE_OTHER): Payer: Self-pay | Admitting: Internal Medicine

## 2023-08-11 ENCOUNTER — Encounter: Payer: Self-pay | Admitting: Internal Medicine

## 2023-08-11 VITALS — BP 132/80 | HR 83 | Temp 97.6°F | Resp 16 | Ht 68.0 in | Wt 310.0 lb

## 2023-08-11 DIAGNOSIS — E118 Type 2 diabetes mellitus with unspecified complications: Secondary | ICD-10-CM | POA: Diagnosis not present

## 2023-08-11 DIAGNOSIS — I5032 Chronic diastolic (congestive) heart failure: Secondary | ICD-10-CM | POA: Diagnosis not present

## 2023-08-11 DIAGNOSIS — R918 Other nonspecific abnormal finding of lung field: Secondary | ICD-10-CM | POA: Diagnosis not present

## 2023-08-11 DIAGNOSIS — I2510 Atherosclerotic heart disease of native coronary artery without angina pectoris: Secondary | ICD-10-CM

## 2023-08-11 DIAGNOSIS — Z7985 Long-term (current) use of injectable non-insulin antidiabetic drugs: Secondary | ICD-10-CM

## 2023-08-11 DIAGNOSIS — E785 Hyperlipidemia, unspecified: Secondary | ICD-10-CM | POA: Diagnosis not present

## 2023-08-11 DIAGNOSIS — I10 Essential (primary) hypertension: Secondary | ICD-10-CM

## 2023-08-11 DIAGNOSIS — Z7984 Long term (current) use of oral hypoglycemic drugs: Secondary | ICD-10-CM

## 2023-08-11 DIAGNOSIS — I110 Hypertensive heart disease with heart failure: Secondary | ICD-10-CM | POA: Diagnosis not present

## 2023-08-11 LAB — CBC WITH DIFFERENTIAL/PLATELET
Basophils Absolute: 0 10*3/uL (ref 0.0–0.1)
Basophils Relative: 0.6 % (ref 0.0–3.0)
Eosinophils Absolute: 0.2 10*3/uL (ref 0.0–0.7)
Eosinophils Relative: 3.6 % (ref 0.0–5.0)
HCT: 44.5 % (ref 36.0–46.0)
Hemoglobin: 14.2 g/dL (ref 12.0–15.0)
Lymphocytes Relative: 32.3 % (ref 12.0–46.0)
Lymphs Abs: 1.8 10*3/uL (ref 0.7–4.0)
MCHC: 31.8 g/dL (ref 30.0–36.0)
MCV: 89.9 fl (ref 78.0–100.0)
Monocytes Absolute: 0.5 10*3/uL (ref 0.1–1.0)
Monocytes Relative: 9.3 % (ref 3.0–12.0)
Neutro Abs: 3.1 10*3/uL (ref 1.4–7.7)
Neutrophils Relative %: 54.2 % (ref 43.0–77.0)
Platelets: 205 10*3/uL (ref 150.0–400.0)
RBC: 4.95 Mil/uL (ref 3.87–5.11)
RDW: 15 % (ref 11.5–15.5)
WBC: 5.6 10*3/uL (ref 4.0–10.5)

## 2023-08-11 LAB — TSH: TSH: 4.27 u[IU]/mL (ref 0.35–5.50)

## 2023-08-11 LAB — URINALYSIS, ROUTINE W REFLEX MICROSCOPIC
Bilirubin Urine: NEGATIVE
Hgb urine dipstick: NEGATIVE
Ketones, ur: NEGATIVE
Leukocytes,Ua: NEGATIVE
Nitrite: NEGATIVE
RBC / HPF: NONE SEEN (ref 0–?)
Specific Gravity, Urine: 1.015 (ref 1.000–1.030)
Total Protein, Urine: NEGATIVE
Urine Glucose: 500 — AB
Urobilinogen, UA: 0.2 (ref 0.0–1.0)
pH: 6 (ref 5.0–8.0)

## 2023-08-11 LAB — HEPATIC FUNCTION PANEL
ALT: 45 U/L — ABNORMAL HIGH (ref 0–35)
AST: 30 U/L (ref 0–37)
Albumin: 3.8 g/dL (ref 3.5–5.2)
Alkaline Phosphatase: 131 U/L — ABNORMAL HIGH (ref 39–117)
Bilirubin, Direct: 0.1 mg/dL (ref 0.0–0.3)
Total Bilirubin: 0.5 mg/dL (ref 0.2–1.2)
Total Protein: 7 g/dL (ref 6.0–8.3)

## 2023-08-11 LAB — HEMOGLOBIN A1C: Hgb A1c MFr Bld: 6.9 % — ABNORMAL HIGH (ref 4.6–6.5)

## 2023-08-11 LAB — BASIC METABOLIC PANEL
BUN: 11 mg/dL (ref 6–23)
CO2: 31 meq/L (ref 19–32)
Calcium: 9.2 mg/dL (ref 8.4–10.5)
Chloride: 102 meq/L (ref 96–112)
Creatinine, Ser: 0.59 mg/dL (ref 0.40–1.20)
GFR: 98.19 mL/min (ref >60.00)
Glucose, Bld: 113 mg/dL — ABNORMAL HIGH (ref 70–99)
Potassium: 3.9 meq/L (ref 3.5–5.1)
Sodium: 139 meq/L (ref 135–145)

## 2023-08-11 LAB — MICROALBUMIN / CREATININE URINE RATIO
Creatinine,U: 53.1 mg/dL
Microalb Creat Ratio: 13.2 mg/g (ref 0.0–30.0)
Microalb, Ur: <0.7 mg/dL (ref 0.0–1.9)

## 2023-08-11 MED ORDER — CARVEDILOL 3.125 MG PO TABS: 3.1250 mg | ORAL_TABLET | Freq: Two times a day (BID) | ORAL | 0 refills | Status: DC

## 2023-08-11 MED ORDER — METFORMIN HCL ER 750 MG PO TB24: 750.0000 mg | ORAL_TABLET | Freq: Every day | ORAL | 0 refills | Status: DC

## 2023-08-11 MED ORDER — TRULICITY 1.5 MG/0.5ML ~~LOC~~ SOAJ: 1.5000 mg | SUBCUTANEOUS | 0 refills | Status: DC

## 2023-08-11 MED ORDER — EMPAGLIFLOZIN 25 MG PO TABS: 25.0000 mg | ORAL_TABLET | Freq: Every day | ORAL | 0 refills | Status: DC

## 2023-08-11 NOTE — Progress Notes (Signed)
 Subjective:  Patient ID: Lorraine Holt, female    DOB: [birth year]  Age: 58 y.o. MRN: [id number]  CC: Hypertension, Diabetes, and Hyperlipidemia   HPI Lorraine Holt presents for f/up ----  Discussed the use of AI scribe software for clinical note transcription with the patient, who gave verbal consent to proceed.  History of Present Illness   Lorraine Holt is a 58 year old female who presents with a growing nodule in the right upper lung.  A growing nodule in the right upper lung was identified on a CT scan performed approximately two weeks ago. She is asymptomatic with no chest pain or shortness of breath, but experiences some lightheadedness and dizziness. She remains active and is scheduled to see a pulmonologist next week for further evaluation.  She has recently quit smoking, having reduced her intake to four or five cigarettes a day before quitting completely a few weeks ago. She has mixed feelings about her health improvement since quitting, with increased cravings for sweets and weight gain. She experiences a craving for sweets and difficulty sleeping unless she eats something substantial, such as half a chicken salad sandwich, which has led to regaining weight she had previously lost.  She experiences sinus drainage, particularly at night, leading to nasal congestion. Despite having a lift bed, she finds herself sliding down during sleep, which affects her ability to keep her head elevated. No pain or swelling in her legs, although she notes discomfort in her shin bone, which she attributes to her knees.       Outpatient Medications Prior to Visit  Medication Sig Dispense Refill   aspirin EC 81 MG tablet Take 81 mg by mouth daily. Swallow whole.     blood glucose meter kit and supplies Dispense based on patient and insurance preference. Use up to two times daily as directed. (FOR ICD-10 E10.9, E11.9). 1 each 0   docusate sodium (COLACE) 50 MG capsule Take 50 mg by mouth daily.      Evolocumab (REPATHA SURECLICK) 140 MG/ML SOAJ Inject 140 mg into the skin every 14 (fourteen) days. 2 mL 2   ezetimibe (ZETIA) 10 MG tablet Take 1 tablet (10 mg total) by mouth daily. 90 tablet 3   ibuprofen (ADVIL) 200 MG tablet Take 400 mg by mouth 3 (three) times daily.     indapamide (LOZOL) 1.25 MG tablet Take 1 tablet by mouth once daily 90 tablet 0   lamoTRIgine (LAMICTAL) 25 MG tablet Take 2 tablets (50 mg total) by mouth 2 (two) times daily. 120 tablet 11   thiamine (VITAMIN B-1) 50 MG tablet Take 1 tablet (50 mg total) by mouth daily. 90 tablet 1   TRUEPLUS LANCETS 30G MISC USE TWICE DAILY 100 each 11   carvedilol (COREG) 3.125 MG tablet TAKE 1 TABLET BY MOUTH TWICE DAILY WITH MEALS 180 tablet 0   empagliflozin (JARDIANCE) 25 MG TABS tablet Take 1 tablet (25 mg total) by mouth daily. 90 tablet 0   metFORMIN (GLUCOPHAGE-XR) 750 MG 24 hr tablet Take 2 tablets (1,500 mg total) by mouth daily with breakfast. 180 tablet 0   rosuvastatin (CRESTOR) 40 MG tablet Take 1 tablet (40 mg total) by mouth daily. 90 tablet 3   TRULICITY 0.75 MG/0.5ML SOAJ INJECT 0.75MG  INTO THE SKIN ONCE A WEEK 12 mL 0   No facility-administered medications prior to visit.    ROS Review of Systems  Constitutional:  Positive for unexpected weight change (wt gain). Negative for appetite change, chills,  diaphoresis and fatigue.  HENT:  Positive for congestion. Negative for postnasal drip, sinus pressure and sinus pain.   Eyes: Negative.   Respiratory:  Negative for cough, chest tightness, shortness of breath and wheezing.   Cardiovascular:  Negative for chest pain, palpitations and leg swelling.  Gastrointestinal: Negative.  Negative for abdominal pain, constipation, diarrhea, nausea and vomiting.  Endocrine: Negative.   Genitourinary: Negative.  Negative for difficulty urinating.  Musculoskeletal:  Positive for arthralgias. Negative for myalgias.  Skin: Negative.   Neurological:  Negative for dizziness.   Hematological:  Negative for adenopathy. Does not bruise/bleed easily.  Psychiatric/Behavioral: Negative.      Objective:  BP 132/80 (BP Location: Left Arm, Patient Position: Sitting, Cuff Size: Large)   Pulse 83   Temp 97.6 F (36.4 C) (Oral)   Resp 16   Ht 5\' 8"  (1.727 m)   Wt (!) 310 lb (140.6 kg)   LMP 08/08/2014   SpO2 98%   BMI 47.14 kg/m   BP Readings from Last 3 Encounters:  08/11/23 132/80  03/01/23 126/82  01/03/23 135/88    Wt Readings from Last 3 Encounters:  08/11/23 (!) 310 lb (140.6 kg)  05/03/23 (!) 309 lb (140.2 kg)  03/01/23 (!) 305 lb (138.3 kg)    Physical Exam Vitals reviewed.  Constitutional:      Appearance: Normal appearance.  HENT:     Nose: Nose normal.     Mouth/Throat:     Mouth: Mucous membranes are moist.  Eyes:     General: No scleral icterus.    Conjunctiva/sclera: Conjunctivae normal.  Cardiovascular:     Rate and Rhythm: Normal rate and regular rhythm.     Pulses: Normal pulses.     Heart sounds: No murmur heard.    No gallop.  Pulmonary:     Effort: Pulmonary effort is normal.     Breath sounds: No stridor. No wheezing, rhonchi or rales.  Abdominal:     General: Abdomen is protuberant. Bowel sounds are normal. There is no distension.     Palpations: Abdomen is soft. There is no hepatomegaly, splenomegaly or mass.     Tenderness: There is no abdominal tenderness.  Musculoskeletal:        General: Normal range of motion.     Cervical back: Neck supple.     Right lower leg: No edema.     Left lower leg: No edema.  Lymphadenopathy:     Cervical: No cervical adenopathy.  Skin:    General: Skin is warm and dry.  Neurological:     General: No focal deficit present.     Mental Status: She is alert. Mental status is at baseline.  Psychiatric:        Mood and Affect: Mood normal.        Behavior: Behavior normal.     Lab Results  Component Value Date   WBC 5.6 08/11/2023   HGB 14.2 08/11/2023   HCT 44.5 08/11/2023    PLT 205.0 08/11/2023   GLUCOSE 113 (H) 08/11/2023   CHOL 190 04/01/2023   TRIG 130 04/01/2023   HDL 51 04/01/2023   LDLCALC 116 (H) 04/01/2023   ALT 45 (H) 08/11/2023   AST 30 08/11/2023   NA 139 08/11/2023   K 3.9 08/11/2023   CL 102 08/11/2023   CREATININE 0.59 08/11/2023   BUN 11 08/11/2023   CO2 31 08/11/2023   TSH 4.27 08/11/2023   HGBA1C 6.9 (H) 08/11/2023   MICROALBUR <0.7 08/11/2023  CT CHEST LUNG CA SCREEN LOW DOSE W/O CM Addendum Date: 08/02/2023 ADDENDUM REPORT: 08/02/2023 17:11 ADDENDUM: A dictation error in the CLINICAL DATA section is corrected as follows: CLINICAL DATA: 58 year old female current smoker with 34 pack-year smoking history. The original report is otherwise unchanged. Electronically Signed   By: Delbert Phenix M.D.   On: 08/02/2023 17:11   Result Date: 08/02/2023 CLINICAL DATA:  58 year old pregnant female with current smoker with 34 pack-year smoking history. EXAM: CT CHEST WITHOUT CONTRAST LOW-DOSE FOR LUNG CANCER SCREENING TECHNIQUE: Multidetector CT imaging of the chest was performed following the standard protocol without IV contrast. RADIATION DOSE REDUCTION: This exam was performed according to the departmental dose-optimization program which includes automated exposure control, adjustment of the mA and/or kV according to patient size and/or use of iterative reconstruction technique. COMPARISON:  03/24/2023 coronary CT.  07/01/2022 screening chest CT. FINDINGS: Cardiovascular: Normal heart size. No significant pericardial effusion/thickening. Left anterior descending coronary atherosclerosis. Atherosclerotic nonaneurysmal thoracic aorta. Normal caliber pulmonary arteries. Mediastinum/Nodes: No significant thyroid nodules. Unremarkable esophagus. No pathologically enlarged axillary, mediastinal or hilar lymph nodes, noting limited sensitivity for the detection of hilar adenopathy on this noncontrast study. Lungs/Pleura: No pneumothorax. No pleural effusion.  Mild centrilobular emphysema with diffuse bronchial wall thickening. No acute consolidative airspace disease or lung masses. Peripheral apical right upper lobe part solid 1.8 x 1.7 cm pulmonary nodule with 0.4 cm solid component (series 3/image 48), previously 1.7 x 1.6 cm with 0.4 cm solid component on 07/01/2022 CT and 1.4 x 1.1 cm with 0.2 cm solid component on baseline 12/10/2020 CT, very slowly growing. A few new clustered indistinct pulmonary nodules in the basilar right lower lobe up to 6.8 mm on series 3/image 193. Upper abdomen: Cholelithiasis. Musculoskeletal: No aggressive appearing focal osseous lesions. Moderate thoracic spondylosis. IMPRESSION: 1. Lung-RADS 4B, suspicious. Additional imaging evaluation or consultation with Pulmonology or Thoracic Surgery recommended. Very slowly growing apical right upper lobe part solid 1.8 cm pulmonary nodule with 0.4 cm solid component, increased from 1.4 cm by 0.2 cm solid component on baseline 12/10/2020 CT, cannot exclude indolent/low-grade primary bronchogenic adenocarcinoma. 2. A few new clustered indistinct pulmonary nodules in the basilar right lower lobe up to 6.8 mm, favoring a mild infectious/inflammatory bronchiolitis. 3. One vessel coronary atherosclerosis. 4. Cholelithiasis. 5. Aortic Atherosclerosis (ICD10-I70.0) and Emphysema (ICD10-J43.9). These results will be called to the ordering clinician or representative by the Radiologist Assistant, and communication documented in the PACS or [employer]. Electronically Signed: By: Delbert Phenix M.D. On: 07/14/2023 22:44   DG Bone Density Result Date: 05/27/2023 Table formatting from the original result was not included. Date of study: 05/27/2023 Exam: DUAL X-RAY ABSORPTIOMETRY (DXA) FOR BONE MINERAL DENSITY (BMD) Instrument: [employer] Requesting Provider: PCP Indication: screening for osteoporosis Comparison: none (please note that it is not possible to compare data from different  instruments) Clinical data: Pt is a 58 y.o. female without previous fractures. Results:  Lumbar spine L1-L4 Femoral neck (FN) T-score + 2.4 RFN: +1.1 LFN: + 0.6 Assessment: the BMD is normal according to the [facility] classification for osteoporosis (see below). Fracture risk: low FRAX score: not calculated due to normal BMD Comments: the technical quality of the study is good Recommend optimizing calcium (1200 mg/day) and vitamin D (800 IU/day) intake. No pharmacological treatment is indicated. Followup: Repeat BMD is appropriate after 2 years. WHO criteria for diagnosis of osteoporosis in postmenopausal women and in men 64 y/o or older: - normal: T-score -1.0 to + 1.0 - osteopenia/low  bone density: T-score between -2.5 and -1.0 - osteoporosis: T-score below -2.5 - severe osteoporosis: T-score below -2.5 with history of fragility fracture Note: although not part of the WHO classification, the presence of a fragility fracture, regardless of the T-score, should be considered diagnostic of osteoporosis, provided other causes for the fracture have been excluded. Interpreted by : Lyndle Herrlich, MD [facility]     Assessment & Plan:   Type II diabetes mellitus with manifestations (HCC)- Her blood sugar is well controlled. -     Urinalysis, Routine w reflex microscopic; Future -     Hemoglobin A1c; Future -     Basic metabolic panel; Future -     Microalbumin / creatinine urine ratio; Future -     Ambulatory referral to Ophthalmology -     Trulicity; Inject 1.5 mg into the skin once a week.  Dispense: 2 mL; Refill: 0 -     metFORMIN HCl ER; Take 1 tablet (750 mg total) by mouth daily with breakfast.  Dispense: 90 tablet; Refill: 0 -     Empagliflozin; Take 1 tablet (25 mg total) by mouth daily.  Dispense: 90 tablet; Refill: 0 -     AMB Referral [facility]BCI Care Management  Essential hypertension, benign- BP is well controlled. -     TSH; Future -     Urinalysis, Routine w reflex microscopic;  Future -     Basic metabolic panel; Future -     CBC with Differential/Platelet; Future -     AMB Referral [facility]BCI Care Management -     Carvedilol; Take 1 tablet (3.125 mg total) by mouth 2 (two) times daily with a meal.  Dispense: 180 tablet; Refill: 0  Hyperlipidemia LDL goal <55 -     TSH; Future -     Hepatic function panel; Future  Mass of right lung- She sees Pulm soon.  Chronic diastolic heart failure (HCC) -     Empagliflozin; Take 1 tablet (25 mg total) by mouth daily.  Dispense: 90 tablet; Refill: 0  Coronary artery disease involving native coronary artery of native heart without angina pectoris -     Carvedilol; Take 1 tablet (3.125 mg total) by mouth 2 (two) times daily with a meal.  Dispense: 180 tablet; Refill: 0  LVH (left ventricular hypertrophy) due to hypertensive disease, with heart failure (HCC) -     Carvedilol; Take 1 tablet (3.125 mg total) by mouth 2 (two) times daily with a meal.  Dispense: 180 tablet; Refill: 0     Follow-up: Return in about 6 months (around 02/11/2024).  Sanda Linger, MD

## 2023-08-11 NOTE — Patient Instructions (Signed)

## 2023-08-12 MED ORDER — ROSUVASTATIN CALCIUM 40 MG PO TABS
40.0000 mg | ORAL_TABLET | Freq: Every day | ORAL | 1 refills | Status: DC
Start: 2023-08-12 — End: 2024-03-08

## 2023-08-15 ENCOUNTER — Other Ambulatory Visit: Payer: Self-pay | Admitting: Internal Medicine

## 2023-08-15 ENCOUNTER — Telehealth: Payer: Self-pay | Admitting: *Deleted

## 2023-08-15 DIAGNOSIS — Z1231 Encounter for screening mammogram for malignant neoplasm of breast: Secondary | ICD-10-CM

## 2023-08-15 NOTE — Progress Notes (Signed)
 Care Guide Pharmacy Note  08/15/2023 Name: Lorraine Holt MRN: [id number] DOB: 09-28-1965  Referred By: Etta Grandchild, MD Reason for referral: Care Coordination (Outreach to schedule referral with pharmacist )   Lorraine Holt is a 58 y.o. year old female who is a primary care patient of Etta Grandchild, MD.  Lorraine Holt was referred to the pharmacist for assistance related to: DMII  Successful contact was made with the patient to discuss pharmacy services including being ready for the pharmacist to call at least 5 minutes before the scheduled appointment time and to have medication bottles and any blood pressure readings ready for review. The patient agreed to meet with the pharmacist via telephone visit on 08/31/2023  Burman Nieves, CMA, Care Guide [facility] Guide Direct Dial: [phone]  Fax: [phone] Website: [email or web address]

## 2023-08-16 ENCOUNTER — Telehealth: Payer: Self-pay | Admitting: Internal Medicine

## 2023-08-16 NOTE — Telephone Encounter (Signed)
 Copied from CRM [id number]. Topic: General - Other >> Aug 16, 2023 12:30 PM Gurney Maxin H wrote: Reason for CRM: Patient is calling in regards to message she sent to provider, patient states she wants to know the providers plan of care for her, she wants to speak with provider directly regarding her liver issues and other issues, wants a call instead of a message.  [place] [phone]

## 2023-08-17 NOTE — Telephone Encounter (Signed)
**Note De-identified  [place] Obfuscation** Please advise 

## 2023-08-17 NOTE — Telephone Encounter (Signed)
 Discussed this with patient. Via phone

## 2023-08-17 NOTE — Telephone Encounter (Signed)
 Patient has been made aware. Gave a verbal understanding.

## 2023-08-23 ENCOUNTER — Encounter: Payer: Self-pay | Admitting: Acute Care

## 2023-08-23 ENCOUNTER — Ambulatory Visit: Payer: 59 | Admitting: Acute Care

## 2023-08-23 ENCOUNTER — Other Ambulatory Visit: Payer: Self-pay

## 2023-08-23 VITALS — BP 118/78 | HR 90 | Ht 70.0 in | Wt 307.8 lb

## 2023-08-23 DIAGNOSIS — R911 Solitary pulmonary nodule: Secondary | ICD-10-CM

## 2023-08-23 DIAGNOSIS — Z122 Encounter for screening for malignant neoplasm of respiratory organs: Secondary | ICD-10-CM

## 2023-08-23 DIAGNOSIS — Z87891 Personal history of nicotine dependence: Secondary | ICD-10-CM

## 2023-08-23 NOTE — Progress Notes (Signed)
 History of Present Illness Lorraine Holt is a 58 y.o. female some day smoker ( 34 pack year smoking history) followed through the lung cancer screening program. Here for consult after an abnormal CT chest 07/04/2023.   08/23/2023 Pt. Presents for follow up, after abnormal low-dose screening CT 07/04/2023.  Patient scan was read as a lung RADS 4B.  There is a very slowly growing apical right upper lobe part solid 1.8 cm pulmonary nodule with a 0.4 centimeter solid component.  This is increased from 1.4 cm diam by 0.2 cm in the last 6 months.  PET scan was ordered to better evaluate this finding.  It showed that the subsolid right upper lobe nodule has a maximum SUV of 1.6.  Per radiology, this low-level activity could be associated with a benign inflammatory/postinflammatory process or low-grade adenocarcinoma. Particular attention to morphologic changes and size changes in the lesion recommended in determining whether surveillance or tissue sampling/resection is most appropriate.  Lorraine Holt  and I discussed this finding.  She states that she quit smoking in January, however she did smell like cigarette smoke today in the office.  We discussed watchful waiting with a 67-month follow-up scan as looking back she does feel that there is potential she had an upper respiratory infection around the time of the scan.  I explained I do not want a wait long-term so we will do a repeat scan in 3 months, if this area continues to look concerning we will move forward with a biopsy.  Patient is in agreement with this plan.  I asked her to call the office to be seen sooner if she develops hemoptysis or unexplained weight loss.  She denies a chronic cough.  Patient verbalized understanding and agreement with the plan  for a 72-month follow-up low-dose CT, followed by office visit with me to review the scan results.  Test Results: LDCT 07/04/2023 Lung-RADS 4B, suspicious. Additional imaging evaluation or consultation  with Pulmonology or Thoracic Surgery recommended. Very slowly growing apical right upper lobe part solid 1.8 cm pulmonary nodule with 0.4 cm solid component, increased from 1.4 cm by 0.2 cm solid component on baseline 12/10/2020 CT, cannot exclude indolent/low-grade primary bronchogenic adenocarcinoma. 2. A few new clustered indistinct pulmonary nodules in the basilar right lower lobe up to 6.8 mm, favoring a mild infectious/inflammatory bronchiolitis. 3. One vessel coronary atherosclerosis. 4. Cholelithiasis. 5. Aortic Atherosclerosis (ICD10-I70.0) and Emphysema (ICD10-J43.9).  PET scan 08/02/2023 The subsolid right upper lobe nodule has a maximum SUV of 1.6. This low-level activity could be associated with a benign inflammatory/postinflammatory process or low-grade adenocarcinoma. Particular attention to morphologic changes and size changes in the lesion recommended in determining whether surveillance or tissue sampling/resection is most appropriate. 2. Upper normal sized 0.9 cm in short axis right paratracheal lymph node, no accentuated metabolic activity. Likely benign. 3. Suspected gallstone measuring up to 2.5 cm in the neck of the gallbladder. 4. Lower lumbar spondylosis. 5. Aortic and coronary atherosclerosis.     Latest Ref Rng & Units 08/11/2023    1:39 PM 12/16/2022   11:52 AM 06/17/2022   11:44 AM  CBC  WBC 4.0 - 10.5 K/uL 5.6  5.8  5.3   Hemoglobin 12.0 - 15.0 g/dL 21.3  08.6  57.8   Hematocrit 36.0 - 46.0 % 44.5  45.6  46.0   Platelets 150.0 - 400.0 K/uL 205.0  227.0  245.0        Latest Ref Rng & Units 08/11/2023  1:39 PM 12/16/2022   11:52 AM 06/17/2022   11:44 AM  BMP  Glucose 70 - 99 mg/dL 086  99  578   BUN 6 - 23 mg/dL 11  17  12    Creatinine 0.40 - 1.20 mg/dL 4.69  6.29  5.28   Sodium 135 - 145 mEq/L 139  140  142   Potassium 3.5 - 5.1 mEq/L 3.9  3.6  3.9   Chloride 96 - 112 mEq/L 102  103  103   CO2 19 - 32 mEq/L 31  31  30    Calcium 8.4 - 10.5 mg/dL  9.2  9.5  9.7     BNP No results found for: "BNP"  ProBNP    Component Value Date/Time   PROBNP 133 04/14/2018 1038   PROBNP 237.0 (H) 12/19/2017 1646    PFT No results found for: "FEV1PRE", "FEV1POST", "FVCPRE", "FVCPOST", "TLC", "DLCOUNC", "PREFEV1FVCRT", "PSTFEV1FVCRT"  NM PET Image Initial (PI) Skull Base To Thigh (F-18 FDG) Result Date: 08/22/2023 CLINICAL DATA:  Initial treatment strategy for right apical lung nodule. EXAM: NUCLEAR MEDICINE PET SKULL BASE TO THIGH TECHNIQUE: 15.4 mCi F-18 FDG was injected intravenously. Full-ring PET imaging was performed from the skull base to thigh after the radiotracer. CT data was obtained and used for attenuation correction and anatomic localization. Fasting blood glucose: 108 mg/dl COMPARISON:  Chest CT 41/32/4401 FINDINGS: Mediastinal blood pool activity: SUV max 2.9 Liver activity: SUV max NA NECK: No significant abnormal hypermetabolic activity in this region. Incidental CT findings: None. CHEST: The subsolid right upper lobe nodule has a maximum SUV of 1.6. This nodule measures about 1.5 by 1.6 cm on image 11 series 7. Incidental CT findings: Mild atheromatous vascular calcification of the aortic arch and left subclavian artery. Left anterior descending coronary atherosclerosis noted with mild cardiomegaly. Upper normal sized 0.9 cm in short axis right paratracheal lymph node on image 59 series 4, no accentuated metabolic activity. ABDOMEN/PELVIS: No significant abnormal hypermetabolic activity in this region. Incidental CT findings: Suspected gallstone measuring up to 2.5 cm in the neck of the gallbladder. Atherosclerosis is present, including aortoiliac atherosclerotic disease. Uterus absent. SKELETON: No significant abnormal hypermetabolic activity in this region. Incidental CT findings: Lower lumbar spondylosis. IMPRESSION: 1. The subsolid right upper lobe nodule has a maximum SUV of 1.6. This low-level activity could be associated with a benign  inflammatory/postinflammatory process or low-grade adenocarcinoma. Particular attention to morphologic changes and size changes in the lesion recommended in determining whether surveillance or tissue sampling/resection is most appropriate. 2. Upper normal sized 0.9 cm in short axis right paratracheal lymph node, no accentuated metabolic activity. Likely benign. 3. Suspected gallstone measuring up to 2.5 cm in the neck of the gallbladder. 4. Lower lumbar spondylosis. 5. Aortic and coronary atherosclerosis. Aortic Atherosclerosis (ICD10-I70.0). Electronically Signed   By: Gaylyn Rong M.D.   On: 08/22/2023 11:21     Past medical hx Past Medical History:  Diagnosis Date   Anemia    history age 40'S   Arthritis    [employer] 02/24/2020   Bronchitis    Carpal tunnel syndrome of right wrist    Diabetes mellitus without complication (HCC)    [employer] 02/24/2020   Hyperlipidemia    Hypertension    Obesity    Peripheral vascular disease (HCC)    undiagnosis, but complains of numbness in legs   Shortness of breath dyspnea    with exertion   Sleep apnea    [employer] 02/24/2020   Tooth abscess 09/30/2014  tooth was extracted     Social History   Tobacco Use   Smoking status: Former    Current packs/day: 0.00    Types: Cigarettes    Quit date: 07/08/2023    Years since quitting: 0.1    Passive exposure: Past   Smokeless tobacco: Never   Tobacco comments:    Pt quit smoking the end of January 2025-08/23/2023  Substance Use Topics   Alcohol use: No   Drug use: No    Lorraine Holt reports that she quit smoking about 6 weeks ago. Her smoking use included cigarettes. She has been exposed to tobacco smoke. She has never used smokeless tobacco. She reports that she does not drink alcohol and does not use drugs.  Tobacco Cessation: Counseling given: Not Answered Tobacco comments: Pt quit smoking the end of January 2025-08/23/2023 Patient states she quit smoking 6 weeks ago. She has a 20+  pack year smoking history  Past surgical hx, Family hx, Social hx all reviewed.  Current Outpatient Medications on File Prior to Visit  Medication Sig   aspirin EC 81 MG tablet Take 81 mg by mouth daily. Swallow whole.   blood glucose meter kit and supplies Dispense based on patient and insurance preference. Use up to two times daily as directed. (FOR ICD-10 E10.9, E11.9).   carvedilol (COREG) 3.125 MG tablet Take 1 tablet (3.125 mg total) by mouth 2 (two) times daily with a meal.   docusate sodium (COLACE) 50 MG capsule Take 50 mg by mouth daily.   Dulaglutide (TRULICITY) 1.5 MG/0.5ML SOAJ Inject 1.5 mg into the skin once a week.   empagliflozin (JARDIANCE) 25 MG TABS tablet Take 1 tablet (25 mg total) by mouth daily.   Evolocumab (REPATHA SURECLICK) 140 MG/ML SOAJ Inject 140 mg into the skin every 14 (fourteen) days.   ibuprofen (ADVIL) 200 MG tablet Take 400 mg by mouth 3 (three) times daily.   indapamide (LOZOL) 1.25 MG tablet Take 1 tablet by mouth once daily   lamoTRIgine (LAMICTAL) 25 MG tablet Take 2 tablets (50 mg total) by mouth 2 (two) times daily.   metFORMIN (GLUCOPHAGE-XR) 750 MG 24 hr tablet Take 1 tablet (750 mg total) by mouth daily with breakfast.   rosuvastatin (CRESTOR) 40 MG tablet Take 1 tablet (40 mg total) by mouth daily.   thiamine (VITAMIN B-1) 50 MG tablet Take 1 tablet (50 mg total) by mouth daily.   TRUEPLUS LANCETS 30G MISC USE TWICE DAILY   ezetimibe (ZETIA) 10 MG tablet Take 1 tablet (10 mg total) by mouth daily.   No current facility-administered medications on file prior to visit.     Allergies  Allergen Reactions   Clindamycin/Lincomycin Swelling    Tongue swelled   Penicillins Swelling and Rash   Amlodipine     Constipation, dizziness    Review Of Systems:  Constitutional:   No  weight loss, night sweats,  Fevers, chills, fatigue, or  lassitude.  HEENT:   No headaches,  Difficulty swallowing,  Tooth/dental problems, or  Sore throat,                 No sneezing, itching, ear ache, nasal congestion, post nasal drip,   CV:  No chest pain,  Orthopnea, PND, swelling in lower extremities, anasarca, dizziness, palpitations, syncope.   GI  No heartburn, indigestion, abdominal pain, nausea, vomiting, diarrhea, change in bowel habits, loss of appetite, bloody stools.   Resp: + shortness of breath with exertion less  at rest.  +  excess mucus, no productive cough,  No non-productive cough,  No coughing up of blood.  No change in color of mucus.  Occasional wheezing.  No chest wall deformity  Skin: no rash or lesions.  GU: no dysuria, change in color of urine, no urgency or frequency.  No flank pain, no hematuria   MS:  No joint pain or swelling.  No decreased range of motion.  No back pain.  Psych:  No change in mood or affect. No depression or anxiety.  No memory loss.   Vital Signs BP 118/78 (BP Location: Left Arm, Patient Position: Sitting, Cuff Size: Large)   Pulse 90   Ht 5\' 10"  (1.778 m)   Wt (!) 307 lb 12.8 oz (139.6 kg)   LMP 08/08/2014   SpO2 95%   BMI 44.16 kg/m    Physical Exam:  General- No distress,  A&Ox3, very pleasant ENT: No sinus tenderness, TM clear, pale nasal mucosa, no oral exudate,no post nasal drip, no LAN Cardiac: S1, S2, regular rate and rhythm, no murmur Chest: No wheeze/ rales/ dullness; no accessory muscle use, no nasal flaring, no sternal retractions, slightly diminished per bases Abd.: Soft Non-tender, nondistended, bowel sounds positive,Body mass index is 44.16 kg/m.  Ext: No clubbing cyanosis, edema Neuro:  normal strength, moving all extremities x 4, alert and oriented x 3 Skin: No rashes, warm and dry, no lesions Psych: normal mood and behavior   Assessment/Plan Abnormal low-dose screening CT inpatient with a 20+ pack year smoking history Quit smoking 6 weeks ago High risk for lung cancer with smoking history Plan Your PET scan shows low level activity in the right upper lobe nodule of  concern.  We will do a 3 month follow up LDCT and if the nodule has changed, we will do a biopsy of the nodule to determine tissue diagnosis. This will be due 10/2023.  You will get a call to get this scheduled closer to the time.  Follow up with me after the scan . You will get a call to get this scheduled.  If you develop any blood in your sputum call to be seen sooner.  Continue to remain smoke free.  You can receive free nicotine replacement therapy (patches, gum, or mints) by calling 1-800-QUIT NOW. Please call so we can get you on the path to becoming a non-smoker. I know it is hard, but you can do this!  Hypnosis for smoking cessation  [employer] [phone]  Acupuncture for smoking cessation  [employer] [phone]     I spent 20 minutes dedicated to the care of this patient on the date of this encounter to include pre-visit review of records, face-to-face time with the patient discussing conditions above, post visit ordering of testing, clinical documentation with the electronic health record, making appropriate referrals as documented, and communicating necessary information to the patient's healthcare team.   Bevelyn Ngo, NP 08/23/2023  9:36 AM

## 2023-08-23 NOTE — Patient Instructions (Addendum)
 It is good to see you today. Your PET scan shows low level activity in the right upper lobe nodule of concern.  We will do a 3 month follow up LDCT and if the nodule has changed, we will do a biopsy of the nodule to determine tissue diagnosis. This will be due 10/2023.  You will get a call to get this scheduled closer to the time.  Follow up with me after the scan . You will get a call to get this scheduled.  If you develop any blood in your sputum call to be seen sooner.  Continue to remain smoke free.  You can receive free nicotine replacement therapy (patches, gum, or mints) by calling 1-800-QUIT NOW. Please call so we can get you on the path to becoming a non-smoker. I know it is hard, but you can do this!  Hypnosis for smoking cessation  [employer] [phone]  Acupuncture for smoking cessation  [employer] [phone]

## 2023-08-24 LAB — HM DIABETES EYE EXAM

## 2023-08-31 ENCOUNTER — Other Ambulatory Visit (INDEPENDENT_AMBULATORY_CARE_PROVIDER_SITE_OTHER): Admitting: Pharmacist

## 2023-08-31 ENCOUNTER — Encounter: Payer: Self-pay | Admitting: Internal Medicine

## 2023-08-31 ENCOUNTER — Other Ambulatory Visit: Payer: Self-pay | Admitting: Internal Medicine

## 2023-08-31 DIAGNOSIS — E118 Type 2 diabetes mellitus with unspecified complications: Secondary | ICD-10-CM

## 2023-08-31 DIAGNOSIS — I10 Essential (primary) hypertension: Secondary | ICD-10-CM

## 2023-08-31 NOTE — Progress Notes (Signed)
 08/31/2023 Name: Lorraine Holt MRN: [id number] DOB: 1965-08-18  Chief Complaint  Patient presents with   Diabetes   Hypertension   Medication Management    Lorraine Holt is a 58 y.o. year old female who presented for a telephone visit.   They were referred to the pharmacist by their PCP for assistance in managing diabetes and hypertension.   Subjective:  Care Team: Primary Care Provider: Etta Grandchild, MD ; Next Scheduled Visit: due 02/11/24   Medication Access/Adherence  Current Pharmacy:  [facility] [address] [place] - [address] [place] [address] Phone: [phone] Fax: [phone]   Patient reports affordability concerns with their medications: No  Patient reports access/transportation concerns to their pharmacy: No  Patient reports adherence concerns with their medications:  No    She notes she has labs planned for 4/14 to get her cholesterol checked - has been working with lipid clinic to start Repatha  Diabetes:  Current medications: Trulicity 1.5 mg SQ once weekly, metformin XR 750 mg once daily, Jardiance 25 mg daily Medications tried in the past: Xigduo ER  Current glucose readings: BG this AM 137 (30-45 min after eating), recent fasting BG 109, 98 Using glucometer; testing 1-2x times daily  Patient denies hypoglycemic s/sx including dizziness, shakiness, sweating. Patient denies hyperglycemic symptoms including polyuria, polydipsia, polyphagia, nocturia, neuropathy, blurred vision.  Current meal patterns: Eats breakfast and an early dinner. Has been working on reducing sweets and decreased portion sizes.   Current physical activity: Goes on walks when able   Hypertension:  Current medications: carvedilol 3.125 mg twice daily, indapamide 1.25 mg daily Medications previously tried: Oceanographer, chlorthalidone, hydrochlorothiazide, losartan, lisinopril, nebivolol   Objective:  BP Readings from Last 3  Encounters:  08/23/23 118/78  08/11/23 132/80  03/01/23 126/82     Lab Results  Component Value Date   HGBA1C 6.9 (H) 08/11/2023    Lab Results  Component Value Date   CREATININE 0.59 08/11/2023   BUN 11 08/11/2023   NA 139 08/11/2023   K 3.9 08/11/2023   CL 102 08/11/2023   CO2 31 08/11/2023    Lab Results  Component Value Date   CHOL 190 04/01/2023   HDL 51 04/01/2023   LDLCALC 116 (H) 04/01/2023   TRIG 130 04/01/2023   CHOLHDL 3.7 04/01/2023    Medications Reviewed Today     Reviewed by Bonita Quin, RPH (Pharmacist) on 08/31/23 at 1025  Med List Status: <None>   Medication Order Taking? Sig Documenting Provider Last Dose Status Informant  aspirin EC 81 MG tablet [id number]  Take 81 mg by mouth daily. Swallow whole. [provider]  Active   blood glucose meter kit and supplies [id number]  Dispense based on patient and insurance preference. Use up to two times daily as directed. (FOR ICD-10 E10.9, E11.9). Etta Grandchild, MD  Active   carvedilol (COREG) 3.125 MG tablet [id number] Yes Take 1 tablet (3.125 mg total) by mouth 2 (two) times daily with a meal. Etta Grandchild, MD Taking Active   docusate sodium (COLACE) 50 MG capsule [id number]  Take 50 mg by mouth daily. [provider]  Active   Dulaglutide (TRULICITY) 1.5 MG/0.5ML Ivory Broad [id number] Yes Inject 1.5 mg into the skin once a week. Etta Grandchild, MD Taking Active   empagliflozin (JARDIANCE) 25 MG TABS tablet [id number] Yes Take 1 tablet (25 mg total) by mouth daily. Etta Grandchild, MD Taking Active  Evolocumab (REPATHA SURECLICK) 140 MG/ML SOAJ [id number] Yes Inject 140 mg into the skin every 14 (fourteen) days. Lewayne Bunting, MD Taking Active   ezetimibe (ZETIA) 10 MG tablet [id number] Yes Take 1 tablet (10 mg total) by mouth daily. Lewayne Bunting, MD Taking Active   ibuprofen (ADVIL) 200 MG tablet [id number] Yes Take 400 mg by mouth 3 (three) times daily. [provider]  Taking Active   indapamide (LOZOL) 1.25 MG tablet [id number] Yes Take 1 tablet by mouth once daily Worthy Rancher B, FNP Taking Active   lamoTRIgine (LAMICTAL) 25 MG tablet [id number] Yes Take 2 tablets (50 mg total) by mouth 2 (two) times daily. Sater, Pearletha Furl, MD Taking Active   metFORMIN (GLUCOPHAGE-XR) 750 MG 24 hr tablet [id number] Yes Take 1 tablet (750 mg total) by mouth daily with breakfast. Etta Grandchild, MD Taking Active   rosuvastatin (CRESTOR) 40 MG tablet [id number] Yes Take 1 tablet (40 mg total) by mouth daily. Etta Grandchild, MD Taking Active   thiamine (VITAMIN B-1) 50 MG tablet [id number]  Take 1 tablet (50 mg total) by mouth daily. Etta Grandchild, MD  Active   TRUEPLUS LANCETS 30G MISC [id number]  USE TWICE DAILY Etta Grandchild, MD  Active               Assessment/Plan:   Diabetes: - Currently controlled, A1c goal <7% - Reviewed long term cardiovascular and renal outcomes of uncontrolled blood sugar - Reviewed goal A1c - Reviewed dietary modifications including increased protein, fruits, and vegetables, carbs/sweets in moderation - Reviewed lifestyle modifications including: increased walking - Recommend to continue current regimen and continue working on lifestyle changes   Hypertension: - Currently controlled, BP goal <130/80 - Recommend to continue current regimen  - Counseled on risks of long term NSAID use - advised to use sparingly when able   Follow Up Plan: PRN  Arbutus Leas, PharmD, BCPS, CPP Clinical Pharmacist Practitioner [facility] [phone]

## 2023-08-31 NOTE — Patient Instructions (Signed)
 It was a pleasure speaking with you today!  Continue your current medication regimen.  Continue monitor blood sugars at home. Goal fasting is 89-130. Goal blood sugar 2 hours after eating is less than 180.  Continue working on diet and exercise to help with diabetes, cholesterol, and overall health goals.  Feel free to call with any questions or concerns!  Arbutus Leas, PharmD, BCPS, CPP Clinical Pharmacist Practitioner [facility] [phone]

## 2023-09-06 ENCOUNTER — Other Ambulatory Visit: Payer: Self-pay | Admitting: Internal Medicine

## 2023-09-06 DIAGNOSIS — E118 Type 2 diabetes mellitus with unspecified complications: Secondary | ICD-10-CM

## 2023-09-20 LAB — LIPOPROTEIN A (LPA): Lipoprotein (a): 240.8 nmol/L — ABNORMAL HIGH (ref <75.0)

## 2023-09-20 LAB — LIPID PANEL
Chol/HDL Ratio: 1.8 ratio (ref 0.0–4.4)
Cholesterol, Total: 100 mg/dL (ref 100–199)
HDL: 55 mg/dL (ref >39)
LDL Chol Calc (NIH): 31 mg/dL (ref 0–99)
Triglycerides: 61 mg/dL (ref 0–149)
VLDL Cholesterol Cal: 14 mg/dL (ref 5–40)

## 2023-09-22 ENCOUNTER — Ambulatory Visit: Payer: Self-pay

## 2023-09-22 ENCOUNTER — Encounter: Payer: Self-pay | Admitting: Pharmacist

## 2023-09-22 NOTE — Telephone Encounter (Signed)
 Patient has been made aware and gave a verbal understanding.

## 2023-09-22 NOTE — Telephone Encounter (Signed)
**Note De-identified  [place] Obfuscation** Please advise 

## 2023-09-22 NOTE — Telephone Encounter (Signed)
 Chief Complaint: upper abdominal pain, constipation Symptoms: upper left abdominal, upper left back pain, nausea, decreased/poor appetite, constipation/small stool Frequency: x 3 days Pertinent Negatives: Patient denies chest pain, difficulty breathing, sweating, vomiting. Disposition: [x] ED /[] Urgent Care (no appt availability in office) / [] Appointment(In office/virtual)/ []  [facility]/ [] Home Care/ [x] Refused Recommended Disposition /[] [place] [facility]/ []  Follow-up with PCP Additional Notes: Patient states she thought it could be constipation, takes colace twice daily. She states she also added a powder laxative yesterday. She states in the past when she felt this way she would take Linzess, however she is not on the medication anymore. She states she has been treating at home with ibuprofen. No available appts with PCP or at [facility]. Advised patient to go to ED, refused and asked if Dr Yetta Barre can send her in the Linzess. Called [facility] and notified staff of patient's refusal of ED.   Copied from CRM [id number]. Topic: Clinical - Red Word Triage >> Sep 22, 2023  3:37 PM Denese Killings wrote: Red Word that prompted transfer to Nurse Triage: Patient has abdominal pain in her upper stomach area and upper left side of back for 2 days . She states pain is persistent and is heading to extreme. Patient hasn't been able to sleep she has to sleep in an upright position. Reason for Disposition  [1] Pain lasts > 10 minutes AND [2] age > 72  Answer Assessment - Initial Assessment Questions 1. LOCATION: "Where does it hurt?"      Upper/upper left abdomen.  2. RADIATION: "Does the pain shoot anywhere else?" (e.g., chest, back)     Left upper back (constant).  3. ONSET: "When did the pain begin?" (e.g., minutes, hours or days ago)      3 days ago.  4. SUDDEN: "Gradual or sudden onset?"     Gradual, worsened 2 days ago.  5. PATTERN "Does the pain come and go, or is it constant?"     - If it comes and goes: "How long does it last?" "Do you have pain now?"     (Note: Comes and goes means the pain is intermittent. It goes away completely between bouts.)    - If constant: "Is it getting better, staying the same, or getting worse?"      (Note: Constant means the pain never goes away completely; most serious pain is constant and gets worse.)      Intermittent; lasts 30-40 minutes.  6. SEVERITY: "How bad is the pain?"  (e.g., Scale 1-10; mild, moderate, or severe)    - MILD (1-3): Doesn't interfere with normal activities, abdomen soft and not tender to touch..     - MODERATE (4-7): Interferes with normal activities or awakens from sleep, abdomen tender to touch.     - SEVERE (8-10): Excruciating pain, doubled over, unable to do any normal activities.       6/10.   7. RECURRENT SYMPTOM: "Have you ever had this type of stomach pain before?" If Yes, ask: "When was the last time?" and "What happened that time?"      Yes, she states the last time she had this pain has been years . She states she used to treat it with Linzess.  8. AGGRAVATING FACTORS: "Does anything seem to cause this pain?" (e.g., foods, stress, alcohol)     Worse when lying flat.  9. CARDIAC SYMPTOMS: "Do you have any of the following symptoms: chest pain, difficulty breathing, sweating, nausea?"     Nausea.  10. OTHER SYMPTOMS: "Do you have any other symptoms?" (e.g., back pain, diarrhea, fever, urination pain, vomiting)       Poor appetite, small sized bowel movements, feels like a knot at the top of her abdomen and tender to palpation.  11. PREGNANCY: "Is there any chance you are pregnant?" "When was your last menstrual period?"       N/A.  Protocols used: Abdominal Pain - Upper-A-AH

## 2023-09-23 ENCOUNTER — Encounter (HOSPITAL_COMMUNITY)
Admission: EM | Disposition: A | Discharge status: Home / Self Care | Payer: Self-pay | Source: Home / Self Care | Attending: Emergency Medicine

## 2023-09-23 ENCOUNTER — Ambulatory Visit (HOSPITAL_COMMUNITY)
Admission: EM | Admit: 2023-09-23 | Discharge: 2023-09-23 | Disposition: A | Discharge status: Home / Self Care | Attending: General Surgery | Admitting: General Surgery

## 2023-09-23 ENCOUNTER — Other Ambulatory Visit: Payer: Self-pay

## 2023-09-23 ENCOUNTER — Emergency Department (HOSPITAL_BASED_OUTPATIENT_CLINIC_OR_DEPARTMENT_OTHER): Admitting: Anesthesiology

## 2023-09-23 ENCOUNTER — Emergency Department (HOSPITAL_COMMUNITY)

## 2023-09-23 ENCOUNTER — Encounter (HOSPITAL_COMMUNITY): Payer: Self-pay | Admitting: *Deleted

## 2023-09-23 ENCOUNTER — Emergency Department (HOSPITAL_COMMUNITY): Admitting: Anesthesiology

## 2023-09-23 DIAGNOSIS — G4730 Sleep apnea, unspecified: Secondary | ICD-10-CM | POA: Insufficient documentation

## 2023-09-23 DIAGNOSIS — I2510 Atherosclerotic heart disease of native coronary artery without angina pectoris: Secondary | ICD-10-CM | POA: Diagnosis not present

## 2023-09-23 DIAGNOSIS — Z79899 Other long term (current) drug therapy: Secondary | ICD-10-CM | POA: Insufficient documentation

## 2023-09-23 DIAGNOSIS — R10816 Epigastric abdominal tenderness: Secondary | ICD-10-CM | POA: Diagnosis not present

## 2023-09-23 DIAGNOSIS — E119 Type 2 diabetes mellitus without complications: Secondary | ICD-10-CM | POA: Diagnosis not present

## 2023-09-23 DIAGNOSIS — K8010 Calculus of gallbladder with chronic cholecystitis without obstruction: Secondary | ICD-10-CM | POA: Diagnosis not present

## 2023-09-23 DIAGNOSIS — K829 Disease of gallbladder, unspecified: Secondary | ICD-10-CM | POA: Diagnosis not present

## 2023-09-23 DIAGNOSIS — R1084 Generalized abdominal pain: Secondary | ICD-10-CM | POA: Diagnosis not present

## 2023-09-23 DIAGNOSIS — E66813 Obesity, class 3: Secondary | ICD-10-CM | POA: Insufficient documentation

## 2023-09-23 DIAGNOSIS — Z7984 Long term (current) use of oral hypoglycemic drugs: Secondary | ICD-10-CM | POA: Diagnosis not present

## 2023-09-23 DIAGNOSIS — K8000 Calculus of gallbladder with acute cholecystitis without obstruction: Secondary | ICD-10-CM | POA: Diagnosis not present

## 2023-09-23 DIAGNOSIS — K828 Other specified diseases of gallbladder: Secondary | ICD-10-CM | POA: Insufficient documentation

## 2023-09-23 DIAGNOSIS — I10 Essential (primary) hypertension: Secondary | ICD-10-CM | POA: Diagnosis not present

## 2023-09-23 DIAGNOSIS — K8020 Calculus of gallbladder without cholecystitis without obstruction: Secondary | ICD-10-CM

## 2023-09-23 DIAGNOSIS — Z6841 Body Mass Index (BMI) 40.0 and over, adult: Secondary | ICD-10-CM | POA: Diagnosis not present

## 2023-09-23 DIAGNOSIS — E118 Type 2 diabetes mellitus with unspecified complications: Secondary | ICD-10-CM

## 2023-09-23 DIAGNOSIS — Z87891 Personal history of nicotine dependence: Secondary | ICD-10-CM

## 2023-09-23 DIAGNOSIS — R1011 Right upper quadrant pain: Secondary | ICD-10-CM | POA: Diagnosis not present

## 2023-09-23 DIAGNOSIS — J449 Chronic obstructive pulmonary disease, unspecified: Secondary | ICD-10-CM | POA: Insufficient documentation

## 2023-09-23 DIAGNOSIS — K760 Fatty (change of) liver, not elsewhere classified: Secondary | ICD-10-CM | POA: Insufficient documentation

## 2023-09-23 DIAGNOSIS — R1013 Epigastric pain: Secondary | ICD-10-CM | POA: Diagnosis not present

## 2023-09-23 HISTORY — PX: CHOLECYSTECTOMY: SHX55

## 2023-09-23 LAB — URINALYSIS, ROUTINE W REFLEX MICROSCOPIC
Bacteria, UA: NONE SEEN
Bilirubin Urine: NEGATIVE
Glucose, UA: >=500 mg/dL — AB
Ketones, ur: 20 mg/dL — AB
Leukocytes,Ua: NEGATIVE
Nitrite: NEGATIVE
Protein, ur: NEGATIVE mg/dL
Specific Gravity, Urine: 1.039 — ABNORMAL HIGH (ref 1.005–1.030)
pH: 5 (ref 5.0–8.0)

## 2023-09-23 LAB — LIPASE, BLOOD: Lipase: 26 U/L (ref 11–51)

## 2023-09-23 LAB — COMPREHENSIVE METABOLIC PANEL WITH GFR
ALT: 44 U/L (ref 0–44)
AST: 25 U/L (ref 15–41)
Albumin: 3.9 g/dL (ref 3.5–5.0)
Alkaline Phosphatase: 140 U/L — ABNORMAL HIGH (ref 38–126)
Anion gap: 8 (ref 5–15)
BUN: 13 mg/dL (ref 6–20)
CO2: 24 mmol/L (ref 22–32)
Calcium: 9.3 mg/dL (ref 8.9–10.3)
Chloride: 105 mmol/L (ref 98–111)
Creatinine, Ser: 0.63 mg/dL (ref 0.44–1.00)
GFR, Estimated: >60 mL/min (ref >60)
Glucose, Bld: 103 mg/dL — ABNORMAL HIGH (ref 70–99)
Potassium: 3.5 mmol/L (ref 3.5–5.1)
Sodium: 137 mmol/L (ref 135–145)
Total Bilirubin: 1 mg/dL (ref 0.0–1.2)
Total Protein: 7.6 g/dL (ref 6.5–8.1)

## 2023-09-23 LAB — GLUCOSE, CAPILLARY
Glucose-Capillary: 104 mg/dL — ABNORMAL HIGH (ref 70–99)
Glucose-Capillary: 144 mg/dL — ABNORMAL HIGH (ref 70–99)

## 2023-09-23 LAB — CBC WITH DIFFERENTIAL/PLATELET
Abs Immature Granulocytes: 0.01 10*3/uL (ref 0.00–0.07)
Basophils Absolute: 0 10*3/uL (ref 0.0–0.1)
Basophils Relative: 1 %
Eosinophils Absolute: 0.1 10*3/uL (ref 0.0–0.5)
Eosinophils Relative: 2 %
HCT: 47.5 % — ABNORMAL HIGH (ref 36.0–46.0)
Hemoglobin: 14.7 g/dL (ref 12.0–15.0)
Immature Granulocytes: 0 %
Lymphocytes Relative: 36 %
Lymphs Abs: 2.1 10*3/uL (ref 0.7–4.0)
MCH: 28.3 pg (ref 26.0–34.0)
MCHC: 30.9 g/dL (ref 30.0–36.0)
MCV: 91.3 fL (ref 80.0–100.0)
Monocytes Absolute: 0.5 10*3/uL (ref 0.1–1.0)
Monocytes Relative: 9 %
Neutro Abs: 3 10*3/uL (ref 1.7–7.7)
Neutrophils Relative %: 52 %
Platelets: 212 10*3/uL (ref 150–400)
RBC: 5.2 MIL/uL — ABNORMAL HIGH (ref 3.87–5.11)
RDW: 14.1 % (ref 11.5–15.5)
WBC: 5.7 10*3/uL (ref 4.0–10.5)
nRBC: 0 % (ref 0.0–0.2)

## 2023-09-23 SURGERY — LAPAROSCOPIC CHOLECYSTECTOMY
Anesthesia: General | Laterality: Left

## 2023-09-23 MED ORDER — HYDROMORPHONE HCL 1 MG/ML IJ SOLN
1.0000 mg | Freq: Once | INTRAMUSCULAR | Status: AC
  Administered 2023-09-23: 1 mg via INTRAVENOUS
  Filled 2023-09-23: qty 1

## 2023-09-23 MED ORDER — SUCCINYLCHOLINE CHLORIDE 200 MG/10ML IV SOSY
PREFILLED_SYRINGE | INTRAVENOUS | Status: DC | PRN
  Administered 2023-09-23: 140 mg via INTRAVENOUS

## 2023-09-23 MED ORDER — FENTANYL CITRATE PF 50 MCG/ML IJ SOSY
25.0000 ug | PREFILLED_SYRINGE | INTRAMUSCULAR | Status: DC | PRN
  Administered 2023-09-23: 25 ug via INTRAVENOUS

## 2023-09-23 MED ORDER — PROPOFOL 10 MG/ML IV BOLUS
INTRAVENOUS | Status: DC | PRN
  Administered 2023-09-23: 200 mg via INTRAVENOUS

## 2023-09-23 MED ORDER — 0.9 % SODIUM CHLORIDE (POUR BTL) OPTIME
TOPICAL | Status: DC | PRN
  Administered 2023-09-23: 1000 mL

## 2023-09-23 MED ORDER — BUPIVACAINE-EPINEPHRINE (PF) 0.25% -1:200000 IJ SOLN
INTRAMUSCULAR | Status: AC
  Filled 2023-09-23: qty 30

## 2023-09-23 MED ORDER — OXYCODONE HCL 5 MG PO TABS: 5.0000 mg | ORAL_TABLET | Freq: Four times a day (QID) | ORAL | 0 refills | Status: DC | PRN

## 2023-09-23 MED ORDER — KETOROLAC TROMETHAMINE 15 MG/ML IJ SOLN
INTRAMUSCULAR | Status: DC | PRN
  Administered 2023-09-23: 15 mg via INTRAVENOUS

## 2023-09-23 MED ORDER — PHENYLEPHRINE HCL-NACL 20-0.9 MG/250ML-% IV SOLN
INTRAVENOUS | Status: DC | PRN
Start: 2023-09-23 — End: 2023-09-23
  Administered 2023-09-23: 50 ug/min via INTRAVENOUS

## 2023-09-23 MED ORDER — KETOROLAC TROMETHAMINE 30 MG/ML IJ SOLN
INTRAMUSCULAR | Status: AC
  Filled 2023-09-23: qty 1

## 2023-09-23 MED ORDER — LIDOCAINE HCL (PF) 2 % IJ SOLN
INTRAMUSCULAR | Status: DC | PRN
  Administered 2023-09-23: 50 mg via INTRADERMAL

## 2023-09-23 MED ORDER — ROCURONIUM BROMIDE 10 MG/ML (PF) SYRINGE
PREFILLED_SYRINGE | INTRAVENOUS | Status: AC
  Filled 2023-09-23: qty 10

## 2023-09-23 MED ORDER — ONDANSETRON HCL 4 MG/2ML IJ SOLN
INTRAMUSCULAR | Status: AC
  Filled 2023-09-23: qty 2

## 2023-09-23 MED ORDER — LIDOCAINE HCL (PF) 2 % IJ SOLN
INTRAMUSCULAR | Status: AC
  Filled 2023-09-23: qty 5

## 2023-09-23 MED ORDER — INDOCYANINE GREEN 25 MG IV SOLR: 1.2500 mg | Freq: Once | INTRAVENOUS | Status: DC

## 2023-09-23 MED ORDER — DROPERIDOL 2.5 MG/ML IJ SOLN: 0.6250 mg | Freq: Once | INTRAMUSCULAR | Status: DC | PRN

## 2023-09-23 MED ORDER — PROPOFOL 10 MG/ML IV BOLUS
INTRAVENOUS | Status: AC
  Filled 2023-09-23: qty 20

## 2023-09-23 MED ORDER — MIDAZOLAM HCL 2 MG/2ML IJ SOLN
INTRAMUSCULAR | Status: AC
  Filled 2023-09-23: qty 2

## 2023-09-23 MED ORDER — SPY AGENT GREEN - (INDOCYANINE FOR INJECTION)
INTRAMUSCULAR | Status: DC | PRN
  Administered 2023-09-23: .5 mL via INTRAVENOUS

## 2023-09-23 MED ORDER — ONDANSETRON HCL 4 MG/2ML IJ SOLN
INTRAMUSCULAR | Status: DC | PRN
  Administered 2023-09-23: 4 mg via INTRAVENOUS

## 2023-09-23 MED ORDER — BUPIVACAINE-EPINEPHRINE 0.25% -1:200000 IJ SOLN
INTRAMUSCULAR | Status: DC | PRN
  Administered 2023-09-23: 25 mL

## 2023-09-23 MED ORDER — FENTANYL CITRATE (PF) 100 MCG/2ML IJ SOLN
INTRAMUSCULAR | Status: AC
Start: 2023-09-23 — End: ?
  Filled 2023-09-23: qty 2

## 2023-09-23 MED ORDER — DEXAMETHASONE SODIUM PHOSPHATE 10 MG/ML IJ SOLN
INTRAMUSCULAR | Status: AC
  Filled 2023-09-23: qty 1

## 2023-09-23 MED ORDER — FENTANYL CITRATE (PF) 100 MCG/2ML IJ SOLN
INTRAMUSCULAR | Status: AC
  Filled 2023-09-23: qty 2

## 2023-09-23 MED ORDER — OXYCODONE HCL 5 MG PO TABS
ORAL_TABLET | ORAL | Status: DC
Start: 2023-09-23 — End: 2023-09-24
  Filled 2023-09-23: qty 1

## 2023-09-23 MED ORDER — MIDAZOLAM HCL 5 MG/5ML IJ SOLN
INTRAMUSCULAR | Status: DC | PRN
Start: 2023-09-23 — End: 2023-09-23
  Administered 2023-09-23: 1 mg via INTRAVENOUS

## 2023-09-23 MED ORDER — ROCURONIUM BROMIDE 10 MG/ML (PF) SYRINGE
PREFILLED_SYRINGE | INTRAVENOUS | Status: DC | PRN
  Administered 2023-09-23: 10 mg via INTRAVENOUS

## 2023-09-23 MED ORDER — LACTATED RINGERS IV SOLN: INTRAVENOUS | Status: DC

## 2023-09-23 MED ORDER — ACETAMINOPHEN 500 MG PO TABS
1000.0000 mg | ORAL_TABLET | Freq: Once | ORAL | Status: AC
  Administered 2023-09-23: 1000 mg via ORAL
  Filled 2023-09-23: qty 2

## 2023-09-23 MED ORDER — LACTATED RINGERS IR SOLN
Status: DC | PRN
  Administered 2023-09-23: 1000 mL

## 2023-09-23 MED ORDER — SUGAMMADEX SODIUM 200 MG/2ML IV SOLN
INTRAVENOUS | Status: DC | PRN
  Administered 2023-09-23: 300 mg via INTRAVENOUS

## 2023-09-23 MED ORDER — PHENYLEPHRINE 80 MCG/ML (10ML) SYRINGE FOR IV PUSH (FOR BLOOD PRESSURE SUPPORT)
PREFILLED_SYRINGE | INTRAVENOUS | Status: DC | PRN
  Administered 2023-09-23: 160 ug via INTRAVENOUS
  Administered 2023-09-23 (×3): 120 ug via INTRAVENOUS

## 2023-09-23 MED ORDER — OXYCODONE HCL 5 MG/5ML PO SOLN: 5.0000 mg | Freq: Once | ORAL | Status: AC | PRN

## 2023-09-23 MED ORDER — FENTANYL CITRATE (PF) 100 MCG/2ML IJ SOLN
INTRAMUSCULAR | Status: DC | PRN
  Administered 2023-09-23 (×4): 50 ug via INTRAVENOUS
  Administered 2023-09-23: 100 ug via INTRAVENOUS

## 2023-09-23 MED ORDER — FENTANYL CITRATE PF 50 MCG/ML IJ SOSY
PREFILLED_SYRINGE | INTRAMUSCULAR | Status: AC
  Filled 2023-09-23: qty 1

## 2023-09-23 MED ORDER — OXYCODONE HCL 5 MG PO TABS
5.0000 mg | ORAL_TABLET | Freq: Once | ORAL | Status: AC | PRN
  Administered 2023-09-23: 5 mg via ORAL

## 2023-09-23 MED ORDER — ACETAMINOPHEN 500 MG PO TABS
1000.0000 mg | ORAL_TABLET | Freq: Three times a day (TID) | ORAL | Status: AC
Start: 2023-09-23 — End: 2023-09-28

## 2023-09-23 MED ORDER — SODIUM CHLORIDE 0.9 % IV SOLN
2.0000 g | Freq: Once | INTRAVENOUS | Status: AC
  Administered 2023-09-23: 2 g via INTRAVENOUS
  Filled 2023-09-23: qty 2

## 2023-09-23 MED ORDER — DEXAMETHASONE SODIUM PHOSPHATE 10 MG/ML IJ SOLN
INTRAMUSCULAR | Status: DC | PRN
Start: 2023-09-23 — End: 2023-09-23
  Administered 2023-09-23: 10 mg via INTRAVENOUS

## 2023-09-23 MED ORDER — ACETAMINOPHEN 10 MG/ML IV SOLN
INTRAVENOUS | Status: AC
  Filled 2023-09-23: qty 100

## 2023-09-23 MED ORDER — CHLORHEXIDINE GLUCONATE 0.12 % MT SOLN
15.0000 mL | Freq: Once | OROMUCOSAL | Status: AC
  Administered 2023-09-23: 15 mL via OROMUCOSAL

## 2023-09-23 SURGICAL SUPPLY — 46 items
APPLICATOR ARISTA FLEXITIP XL (MISCELLANEOUS) IMPLANT
APPLIER CLIP 5 13 M/L LIGAMAX5 (MISCELLANEOUS) IMPLANT
APPLIER CLIP ROT 10 11.4 M/L (STAPLE) ×1 IMPLANT
BAG COUNTER SPONGE SURGICOUNT (BAG) IMPLANT
CABLE HIGH FREQUENCY MONO STRZ (ELECTRODE) ×1 IMPLANT
CHLORAPREP W/TINT 26 (MISCELLANEOUS) ×1 IMPLANT
CLIP APPLIE 5 13 M/L LIGAMAX5 (MISCELLANEOUS) IMPLANT
CLIP APPLIE ROT 10 11.4 M/L (STAPLE) IMPLANT
CLIP LIGATING HEMO O LOK GREEN (MISCELLANEOUS) IMPLANT
COVER MAYO STAND XLG (MISCELLANEOUS) IMPLANT
COVER SURGICAL LIGHT HANDLE (MISCELLANEOUS) ×1 IMPLANT
DERMABOND ADVANCED .7 DNX12 (GAUZE/BANDAGES/DRESSINGS) IMPLANT
DRAPE C-ARM 42X120 X-RAY (DRAPES) IMPLANT
DRSG TEGADERM 2-3/8X2-3/4 SM (GAUZE/BANDAGES/DRESSINGS) ×3 IMPLANT
DRSG TEGADERM 4X4.75 (GAUZE/BANDAGES/DRESSINGS) ×1 IMPLANT
ELECT REM PT RETURN 15FT ADLT (MISCELLANEOUS) ×1 IMPLANT
GAUZE SPONGE 2X2 8PLY STRL LF (GAUZE/BANDAGES/DRESSINGS) ×1 IMPLANT
GLOVE BIO SURGEON STRL SZ7.5 (GLOVE) ×1 IMPLANT
GLOVE INDICATOR 8.0 STRL GRN (GLOVE) ×1 IMPLANT
GOWN STRL REUS W/ TWL XL LVL3 (GOWN DISPOSABLE) ×1 IMPLANT
GRASPER SUT TROCAR 14GX15 (MISCELLANEOUS) IMPLANT
HEMOSTAT ARISTA ABSORB 3G PWDR (HEMOSTASIS) IMPLANT
HEMOSTAT SNOW SURGICEL 2X4 (HEMOSTASIS) IMPLANT
IRRIG SUCT STRYKERFLOW 2 WTIP (MISCELLANEOUS) ×1 IMPLANT
IRRIGATION SUCT STRKRFLW 2 WTP (MISCELLANEOUS) ×1 IMPLANT
KIT BASIN OR (CUSTOM PROCEDURE TRAY) ×1 IMPLANT
KIT TURNOVER KIT A (KITS) IMPLANT
L-HOOK LAP DISP 36CM (ELECTROSURGICAL) IMPLANT
LHOOK LAP DISP 36CM (ELECTROSURGICAL) IMPLANT
POUCH RETRIEVAL ECOSAC 10 (ENDOMECHANICALS) ×1 IMPLANT
SCISSORS LAP 5X35 DISP (ENDOMECHANICALS) ×1 IMPLANT
SET CHOLANGIOGRAPH MIX (MISCELLANEOUS) IMPLANT
SET TUBE SMOKE EVAC HIGH FLOW (TUBING) ×1 IMPLANT
SLEEVE ADV FIXATION 5X100MM (TROCAR) ×1 IMPLANT
SPIKE FLUID TRANSFER (MISCELLANEOUS) ×1 IMPLANT
STRIP CLOSURE SKIN 1/2X4 (GAUZE/BANDAGES/DRESSINGS) ×1 IMPLANT
SUT MNCRL AB 4-0 PS2 18 (SUTURE) ×1 IMPLANT
SUT VIC AB 0 UR5 27 (SUTURE) IMPLANT
SUT VICRYL 0 TIES 12 18 (SUTURE) IMPLANT
SUT VICRYL 0 UR6 27IN ABS (SUTURE) IMPLANT
TOWEL OR 17X26 10 PK STRL BLUE (TOWEL DISPOSABLE) ×1 IMPLANT
TRAY LAPAROSCOPIC (CUSTOM PROCEDURE TRAY) ×1 IMPLANT
TROCAR ADV FIXATION 12X100MM (TROCAR) IMPLANT
TROCAR ADV FIXATION 5X100MM (TROCAR) ×1 IMPLANT
TROCAR BALLN 12MMX100 BLUNT (TROCAR) IMPLANT
TROCAR XCEL NON-BLD 5MMX100MML (ENDOMECHANICALS) IMPLANT

## 2023-09-23 NOTE — Anesthesia Procedure Notes (Signed)
 Procedure Name: Intubation Date/Time: 09/23/2023 2:51 PM  Performed by: Vernadine Golas, MDPre-anesthesia Checklist: Patient identified, Emergency Drugs available, Suction available and Patient being monitored Patient Re-evaluated:Patient Re-evaluated prior to induction Oxygen Delivery Method: Circle system utilized Preoxygenation: Pre-oxygenation with 100% oxygen Induction Type: IV induction and Rapid sequence Laryngoscope Size: 2 and Miller Grade View: Grade II Tube type: Oral Tube size: 7.0 mm Number of attempts: 1 Airway Equipment and Method: Stylet Placement Confirmation: ETT inserted through vocal cords under direct vision, positive ETCO2 and breath sounds checked- equal and bilateral Secured at: 21 cm Tube secured with: Tape Dental Injury: Teeth and Oropharynx as per pre-operative assessment

## 2023-09-23 NOTE — Anesthesia Postprocedure Evaluation (Signed)
 Anesthesia Post Note  Patient: Lorraine Holt  Procedure(s) Performed: LAPAROSCOPIC CHOLECYSTECTOMY (Left)     Patient location during evaluation: PACU Anesthesia Type: General Level of consciousness: awake and alert Pain management: pain level controlled Vital Signs Assessment: post-procedure vital signs reviewed and stable Respiratory status: spontaneous breathing, nonlabored ventilation and respiratory function stable Cardiovascular status: blood pressure returned to baseline Postop Assessment: no apparent nausea or vomiting Anesthetic complications: no   No notable events documented.  Last Vitals:  Vitals:   09/23/23 1713 09/23/23 1715  BP: (!) 131/104 (!) 131/104  Pulse: 88 85  Resp: 16   Temp: (!) 36.4 C   SpO2: 93% 92%    Last Pain:  Vitals:   09/23/23 1713  TempSrc: Temporal  PainSc: 3                  Vertell Row

## 2023-09-23 NOTE — Transfer of Care (Addendum)
 Immediate Anesthesia Transfer of Care Note  Patient: Lorraine Holt  Procedure(s) Performed: LAPAROSCOPIC CHOLECYSTECTOMY (Left)  Patient Location: PACU  Anesthesia Type:General  Level of Consciousness: awake and patient cooperative  Airway & Oxygen Therapy: Patient Spontanous Breathing and Patient connected to face mask  Post-op Assessment: Report given to RN and Post -op Vital signs reviewed and stable  Post vital signs: Reviewed and stable  Last Vitals:  Vitals Value Taken Time  BP 156/101 09/23/23 1620  Temp    Pulse 99 09/23/23 1624  Resp 18 09/23/23 1624  SpO2 98 % 09/23/23 1624  Vitals shown include unfiled device data.  Last Pain:  Vitals:   09/23/23 1433  TempSrc: Oral  PainSc: 3       Patients Stated Pain Goal: 0 (09/23/23 1433)  Complications: No notable events documented.

## 2023-09-23 NOTE — Anesthesia Preprocedure Evaluation (Addendum)
 Anesthesia Evaluation  Patient identified by MRN, date of birth, ID band Patient awake    Reviewed: Allergy & Precautions, NPO status , Patient's Chart, lab work & pertinent test results, reviewed documented beta bl

## 2023-09-23 NOTE — ED Provider Notes (Signed)
 [place] EMERGENCY DEPARTMENT AT [facility] Provider Note   CSN: [id number] Arrival date & time: 09/23/23  1035     History  Chief Complaint  Patient presents with   Abdominal Pain    Lorraine Holt is a 58 y.o. female.  HPI 58 year old female presents with upper abdominal pain.  Symptoms have been ongoing for about 3 days.  Pain comes and goes and feels like a sharp pain.  Nothing specifically makes it occur though it is worse at night and she feels like she has to sit up more upright.  She denies any chest pain, shortness of breath.  She is having some pain along the right side of her mid back and in the right upper quadrant and epigastrium of her anterior abdomen.  She has had some nausea and feels like she can only take a couple bites because then she feels full immediately.  She has also had some constipation with smaller and harder bowel movements.  No urinary symptoms.  Pain is about a 4 or 5 out of 10.  Tylenol  and ibuprofen  temporarily help the symptoms.  Home Medications Prior to Admission medications   Medication Sig Start Date End Date Taking? Authorizing Provider  aspirin  EC 81 MG tablet Take 81 mg by mouth daily. Swallow whole.   Yes [provider]  carvedilol  (COREG ) 3.125 MG tablet Take 1 tablet (3.125 mg total) by mouth 2 (two) times daily with a meal. 08/11/23  Yes Arcadio Knuckles, MD  docusate sodium  (COLACE) 50 MG capsule Take 50 mg by mouth daily.   Yes [provider]  empagliflozin  (JARDIANCE ) 25 MG TABS tablet Take 1 tablet (25 mg total) by mouth daily. 08/11/23  Yes Arcadio Knuckles, MD  ezetimibe  (ZETIA ) 10 MG tablet Take 1 tablet (10 mg total) by mouth daily. 04/05/23  Yes Lenise Quince, MD  ibuprofen  (ADVIL ) 200 MG tablet Take 400 mg by mouth 3 (three) times daily.   Yes [provider]  indapamide  (LOZOL ) 1.25 MG tablet Take 1 tablet by mouth once daily 07/09/23  Yes Webb, Padonda B, FNP  lamoTRIgine  (LAMICTAL ) 25 MG  tablet Take 2 tablets (50 mg total) by mouth 2 (two) times daily. 10/13/22  Yes Sater, Sherida Dimmer, MD  metFORMIN  (GLUCOPHAGE -XR) 750 MG 24 hr tablet Take 1 tablet (750 mg total) by mouth daily with breakfast. 08/11/23  Yes Arcadio Knuckles, MD  rosuvastatin  (CRESTOR ) 40 MG tablet Take 1 tablet (40 mg total) by mouth daily. 08/12/23 11/10/23 Yes Arcadio Knuckles, MD  thiamine  (VITAMIN B-1) 50 MG tablet Take 1 tablet (50 mg total) by mouth daily. 12/20/21  Yes Arcadio Knuckles, MD  Evolocumab  (REPATHA  SURECLICK) 140 MG/ML SOAJ Inject 140 mg into the skin every 14 (fourteen) days. 07/12/23   Lenise Quince, MD  TRUEPLUS LANCETS 30G MISC USE TWICE DAILY 04/18/17   Arcadio Knuckles, MD  TRULICITY  1.5 MG/0.5ML SOAJ INJECT 1.5 MG SUBCUTANEOUSLY ONCE A WEEK 09/06/23   Arcadio Knuckles, MD      Allergies    Clindamycin /lincomycin, Penicillins, and Amlodipine     Review of Systems   Review of Systems  Constitutional:  Negative for fever.  Respiratory:  Negative for shortness of breath.   Cardiovascular:  Negative for chest pain.  Gastrointestinal:  Positive for abdominal pain, constipation and nausea. Negative for diarrhea and vomiting.  Genitourinary:  Negative for dysuria.    Physical Exam Updated Vital Signs BP 134/79   Pulse 75  Temp 98.8 F (37.1 C) (Oral)   Resp 16   Ht 5\' 10"  (1.778 m)   Wt (!) 138.8 kg   LMP 08/08/2014   SpO2 96%   BMI 43.91 kg/m  Physical Exam Vitals and nursing note reviewed.  Constitutional:      General: She is not in acute distress.    Appearance: She is well-developed. She is obese. She is not ill-appearing or diaphoretic.  HENT:     Head: Normocephalic and atraumatic.  Cardiovascular:     Rate and Rhythm: Normal rate and regular rhythm.     Heart sounds: Normal heart sounds.  Pulmonary:     Effort: Pulmonary effort is normal.     Breath sounds: Normal breath sounds.  Abdominal:     Palpations: Abdomen is soft.     Tenderness: There is abdominal tenderness in the  right upper quadrant and epigastric area.     Comments: No rash to the abdomen or flank/back  Skin:    General: Skin is warm and dry.  Neurological:     Mental Status: She is alert.     ED Results / Procedures / Treatments   Labs (all labs ordered are listed, but only abnormal results are displayed) Labs Reviewed  COMPREHENSIVE METABOLIC PANEL WITH GFR - Abnormal; Notable for the following components:      Result Value   Glucose, Bld 103 (*)    Alkaline Phosphatase 140 (*)    All other components within normal limits  URINALYSIS, ROUTINE W REFLEX MICROSCOPIC - Abnormal; Notable for the following components:   Specific Gravity, Urine 1.039 (*)    Glucose, UA >=500 (*)    Hgb urine dipstick SMALL (*)    Ketones, ur 20 (*)    All other components within normal limits  CBC WITH DIFFERENTIAL/PLATELET - Abnormal; Notable for the following components:   RBC 5.20 (*)    HCT 47.5 (*)    All other components within normal limits  LIPASE, BLOOD    EKG EKG Interpretation Date/Time:  Friday September 23 2023 11:07:07 EDT Ventricular Rate:  78 PR Interval:  184 QRS Duration:  92 QT Interval:  394 QTC Calculation: 449 R Axis:   11  Text Interpretation: Sinus rhythm Consider left atrial enlargement Low voltage, precordial leads no acute ST/T changes similar to Sept 2024 Confirmed by Jerilynn Montenegro [phone]) on 09/23/2023 11:10:42 AM  Radiology US  Abdomen Limited RUQ (LIVER/GB) Result Date: 09/23/2023 CLINICAL DATA:  Right upper quadrant pain EXAM: ULTRASOUND ABDOMEN LIMITED RIGHT UPPER QUADRANT COMPARISON:  PET-CT 08/02/2023 FINDINGS: Gallbladder: Gallbladder is underdistended. Borderline wall thickening. Shadowing stone. No adjacent fluid. The sonographer does report pain when scanning the gallbladder but gallbladder is deep to the liver and there is also a large amount of overlapping bowel and soft tissue. The gallbladder is also not dilated on this examination. Common bile duct: Diameter: 6  mm Liver: Diffusely echogenic hepatic parenchyma. Portal vein is patent on color Doppler imaging with normal direction of blood flow towards the liver. Other: Evaluation significant limited by overlapping bowel gas and soft tissue. IMPRESSION: Stone in the gallbladder. Gallbladder is nondilated. No ductal dilatation. Fatty liver infiltration. The sonographer does report pain when scanning the gallbladder but the gallbladder is in a difficult location for true a sonographic Murphy's sign evaluation. If there is clinical concern of acute cholecystitis, HIDA scan would be useful to confirm etiology. Electronically Signed   By: Adrianna Horde M.D.   On: 09/23/2023 12:32  Procedures Procedures    Medications Ordered in ED Medications  indocyanine green  (IC-GREEN ) injection 1.25 mg (has no administration in time range)  cefoTEtan  (CEFOTAN ) 2 g in sodium chloride  0.9 % 100 mL IVPB ( Intravenous MAR Hold 09/23/23 1419)  acetaminophen  (TYLENOL ) tablet 1,000 mg ( Oral MAR Hold 09/23/23 1419)  HYDROmorphone  (DILAUDID ) injection 1 mg (1 mg Intravenous Given 09/23/23 1159)    ED Course/ Medical Decision Making/ A&P                                 Medical Decision Making Amount and/or Complexity of Data Reviewed Labs: ordered.    Details: Normal WBC Radiology: ordered and independent interpretation performed.    Details: cholelithiasis ECG/medicine tests: ordered and independent interpretation performed.    Details: No ischemia  Risk Prescription drug management. Decision regarding hospitalization.   Patient continues to have abdominal pain.  Due to this as well as the equivocal ultrasound findings of possible Murphy sign in addition to cholelithiasis, I consulted Dr. Elvan Hamel.  He has seen and evaluated the patient and will take to the OR for symptomatic cholelithiasis.        Final Clinical Impression(s) / ED Diagnoses Final diagnoses:  Calculus of gallbladder without cholecystitis without  obstruction    Rx / DC Orders ED Discharge Orders     None         Jerilynn Montenegro, MD 09/23/23 1428

## 2023-09-23 NOTE — ED Notes (Signed)
Patient has urine culture in the main lab °

## 2023-09-23 NOTE — H&P (Signed)
 Reason for consult: Epigastric pain, upper quadrant pain, cholelithiasis  CC: Stomach pain since Monday  Requesting provider: Dr. Aldean Amass  HPI: Lorraine Holt is an 58 y.o. female who is here for evaluation because of persistent epigastric and right upper quadrant pain that started earlier in the week.  Patient states it is intermittent but when it comes on it will last for a while.  Sometimes associated with nausea but no vomiting.  She has had anorexia and is not really ate a lot over the past few days.  She does have some baseline constipation.  No hematemesis.  No diarrhea.  She took some Tums without much relief.  She has been taking Tylenol  and ibuprofen  for the pain which helps decrease it.  The pain does radiate to her back.  Bowel movements have been smaller and harder.  Had a prior laparoscopic hysterectomy.  Quit smoking about 2 weeks ago.  About 1 pack that would last 1 and half days.  No drug or alcohol  use  No chest pain, chest pressure, shortness of breath,  Past medical history includes diabetes mellitus type 2, obstructive sleep apnea not on CPAP, hypertension, hyperlipidemia  Past Medical History:  Diagnosis Date   Anemia    history age 63'S   Arthritis    [employer] 02/24/2020   Bronchitis    Carpal tunnel syndrome of right wrist    Diabetes mellitus without complication (HCC)    [employer] 02/24/2020   Hyperlipidemia    Hypertension    Obesity    Peripheral vascular disease (HCC)    undiagnosis, but complains of numbness in legs   Shortness of breath dyspnea    with exertion   Sleep apnea    [employer] 02/24/2020   Tooth abscess 09/30/2014   tooth was extracted    Past Surgical History:  Procedure Laterality Date   ABDOMINAL HYSTERECTOMY  09/2015   BILATERAL SALPINGECTOMY Right 09/17/2014   Procedure: RIGHT SALPINGECTOMY;  Surgeon: Lenord Radon, MD;  Location: [facility];  Service: Gynecology;  Laterality: Right;   BREAST BIOPSY Left 03/2014    COLONOSCOPY  04/2014   DILATION AND CURETTAGE OF UTERUS     ROBOTIC ASSISTED TOTAL HYSTERECTOMY N/A 09/17/2014   Procedure: ROBOTIC ASSISTED TOTAL HYSTERECTOMY;  Surgeon: Lenord Radon, MD;  Location: [facility];  Service: Gynecology;  Laterality: N/A;   TOOTH EXTRACTION  09/30/2014   TUBAL LIGATION      Family History  Problem Relation Age of Onset   Hypertension Mother    Diabetes Mother    Breast cancer Mother    Diabetes Maternal Aunt    Breast cancer Maternal Aunt    Breast cancer Maternal Aunt    Diabetes Maternal Uncle    COPD Neg Hx    Early death Neg Hx    Heart disease Neg Hx    Hyperlipidemia Neg Hx    Kidney disease Neg Hx    Stroke Neg Hx     Social:  reports that she quit smoking about 2 months ago. Her smoking use included cigarettes. She has been exposed to tobacco smoke. She has never used smokeless tobacco. She reports that she does not drink alcohol  and does not use drugs.  Allergies:  Allergies  Allergen Reactions   Clindamycin /Lincomycin Swelling    Tongue swelled   Penicillins Swelling and Rash   Amlodipine      Constipation, dizziness    Medications: I have reviewed the patient's current medications.   ROS - all of the  below systems have been reviewed with the patient and positives are indicated with bold text General: chills, fever or night sweats Eyes: blurry vision or double vision ENT: epistaxis or sore throat Allergy/Immunology: itchy/watery eyes or nasal congestion Hematologic/Lymphatic: bleeding problems, blood clots or swollen lymph nodes Endocrine: temperature intolerance or unexpected weight changes Breast: new or changing breast lumps or nipple discharge Resp: cough, shortness of breath, or wheezing CV: chest pain or dyspnea on exertion GI: as per HPI GU: dysuria, trouble voiding, or hematuria MSK: joint pain or joint stiffness Neuro: TIA or stroke symptoms Derm: pruritus and skin lesion changes Psych: anxiety and  depression  PE Blood pressure 134/79, pulse 75, temperature 98.8 F (37.1 C), temperature source Oral, resp. rate 16, height 5\' 10"  (1.778 m), weight (!) 138.8 kg, last menstrual period 08/08/2014, SpO2 96%. Constitutional: NAD; conversant; no deformities Eyes: Moist conjunctiva; no lid lag; anicteric; PERRL Neck: Trachea midline; no thyromegaly Lungs: Normal respiratory effort; no tactile fremitus CV: RRR; no palpable thrills; no pitting edema GI: Abd soft, tender to palpation epigastric and right upper quadrant.  Some voluntary guarding.  No rebound or peritonitis; no palpable hepatosplenomegaly MSK: Normal gait; no clubbing/cyanosis Psychiatric: Appropriate affect; alert and oriented x3 Lymphatic: No palpable cervical or axillary lymphadenopathy Skin: No rash, lesions or jaundice  Results for orders placed or performed during the hospital encounter of 09/23/23 (from the past 48 hours)  Urinalysis, Routine w reflex microscopic -Urine, Clean Catch     Status: Abnormal   Collection Time: 09/23/23 11:08 AM  Result Value Ref Range   Color, Urine YELLOW YELLOW   APPearance CLEAR CLEAR   Specific Gravity, Urine 1.039 (H) 1.005 - 1.030   pH 5.0 5.0 - 8.0   Glucose, UA >=500 (A) NEGATIVE mg/dL   Hgb urine dipstick SMALL (A) NEGATIVE   Bilirubin Urine NEGATIVE NEGATIVE   Ketones, ur 20 (A) NEGATIVE mg/dL   Protein, ur NEGATIVE NEGATIVE mg/dL   Nitrite NEGATIVE NEGATIVE   Leukocytes,Ua NEGATIVE NEGATIVE   RBC / HPF 0-5 0 - 5 RBC/hpf   WBC, UA 0-5 0 - 5 WBC/hpf   Bacteria, UA NONE SEEN NONE SEEN   Squamous Epithelial / HPF 0-5 0 - 5 /HPF   Mucus PRESENT     Comment: Performed at [facility], [address] [place], [place] [address]  Lipase, blood     Status: None   Collection Time: 09/23/23 11:26 AM  Result Value Ref Range   Lipase 26 11 - 51 U/L    Comment: Performed at [facility], [address] [place], [place] [address]  Comprehensive  metabolic panel     Status: Abnormal   Collection Time: 09/23/23 11:26 AM  Result Value Ref Range   Sodium 137 135 - 145 mmol/L   Potassium 3.5 3.5 - 5.1 mmol/L   Chloride 105 98 - 111 mmol/L   CO2 24 22 - 32 mmol/L   Glucose, Bld 103 (H) 70 - 99 mg/dL    Comment: Glucose reference range applies only to samples taken after fasting for at least 8 hours.   BUN 13 6 - 20 mg/dL   Creatinine, Ser 0.98 0.44 - 1.00 mg/dL   Calcium  9.3 8.9 - 10.3 mg/dL   Total Protein 7.6 6.5 - 8.1 g/dL   Albumin 3.9 3.5 - 5.0 g/dL   AST 25 15 - 41 U/L   ALT 44 0 - 44 U/L   Alkaline Phosphatase 140 (H) 38 - 126  U/L   Total Bilirubin 1.0 0.0 - 1.2 mg/dL   GFR, Estimated >16 >10 mL/min    Comment: (NOTE) Calculated using the CKD-EPI Creatinine Equation (2021)    Anion gap 8 5 - 15    Comment: Performed at [facility], [address], [place], [place] [address]  CBC with Differential     Status: Abnormal   Collection Time: 09/23/23 11:26 AM  Result Value Ref Range   WBC 5.7 4.0 - 10.5 K/uL   RBC 5.20 (H) 3.87 - 5.11 MIL/uL   Hemoglobin 14.7 12.0 - 15.0 g/dL   HCT 40.9 (H) 81.1 - 91.4 %   MCV 91.3 80.0 - 100.0 fL   MCH 28.3 26.0 - 34.0 pg   MCHC 30.9 30.0 - 36.0 g/dL   RDW 78.2 95.6 - 21.3 %   Platelets 212 150 - 400 K/uL   nRBC 0.0 0.0 - 0.2 %   Neutrophils Relative % 52 %   Neutro Abs 3.0 1.7 - 7.7 K/uL   Lymphocytes Relative 36 %   Lymphs Abs 2.1 0.7 - 4.0 K/uL   Monocytes Relative 9 %   Monocytes Absolute 0.5 0.1 - 1.0 K/uL   Eosinophils Relative 2 %   Eosinophils Absolute 0.1 0.0 - 0.5 K/uL   Basophils Relative 1 %   Basophils Absolute 0.0 0.0 - 0.1 K/uL   Immature Granulocytes 0 %   Abs Immature Granulocytes 0.01 0.00 - 0.07 K/uL    Comment: Performed at [facility], [address] [place], [place] [address]    US  Abdomen Limited RUQ (LIVER/GB) Result Date: 09/23/2023 CLINICAL DATA:  Right upper quadrant pain EXAM: ULTRASOUND ABDOMEN LIMITED  RIGHT UPPER QUADRANT COMPARISON:  PET-CT 08/02/2023 FINDINGS: Gallbladder: Gallbladder is underdistended. Borderline wall thickening. Shadowing stone. No adjacent fluid. The sonographer does report pain when scanning the gallbladder but gallbladder is deep to the liver and there is also a large amount of overlapping bowel and soft tissue. The gallbladder is also not dilated on this examination. Common bile duct: Diameter: 6 mm Liver: Diffusely echogenic hepatic parenchyma. Portal vein is patent on color Doppler imaging with normal direction of blood flow towards the liver. Other: Evaluation significant limited by overlapping bowel gas and soft tissue. IMPRESSION: Stone in the gallbladder. Gallbladder is nondilated. No ductal dilatation. Fatty liver infiltration. The sonographer does report pain when scanning the gallbladder but the gallbladder is in a difficult location for true a sonographic Murphy's sign evaluation. If there is clinical concern of acute cholecystitis, HIDA scan would be useful to confirm etiology. Electronically Signed   By: Adrianna Horde M.D.   On: 09/23/2023 12:32    Imaging: Reviewed  A/P: Lorraine Holt is an 58 y.o. female with  Symptomatic cholelithiasis, possible early acute cholecystitis Severe obesity Diabetes mellitus type 2 Hypertension hyperLipidemia Obstructive sleep apnea History of tobacco use Atypical lung nodule  I believe the patient's symptoms are consistent with gallbladder disease.  I think less likely would be gastritis/ulcer  We discussed gallbladder disease.We discussed non-operative and operative management. We discussed the signs & symptoms of acute cholecystitis  I think the patient has failed outpatient management.  She has had continued symptoms since earlier in the week.  She is still tender on exam.  While there is no overt radiological or laboratory signs of cholecystitis she is still tender on exam and I think she has a high risk of bounce back  to the emergency room so I offered her  surgery  I discussed laparoscopic cholecystectomy  in detail.  The patient was given educational material as well as diagrams detailing the procedure.  We discussed the risks and benefits of a laparoscopic cholecystectomy including, but not limited to bleeding, infection, injury to surrounding structures such as the intestine or liver, bile leak, retained gallstones, need to convert to an open procedure, prolonged diarrhea, blood clots such as  DVT, common bile duct injury, anesthesia risks, and possible need for additional procedures.  We discussed the typical post-operative recovery course. I explained that the likelihood of improvement of their symptoms is good.  Would like to proceed with surgery  Data reviewed: I reviewed ED notes, vital signs today, labs for the past month, PCP office note March 6, pulmonary office note August 23, 2023, ultrasound imaging   Marianna Shirk. Elvan Hamel, MD, FACS General, Bariatric, & Minimally Invasive Surgery [facility]

## 2023-09-23 NOTE — Op Note (Signed)
 Lorraine Holt [id number] 1965/06/17 09/23/2023  Laparoscopic Cholecystectomy with near infrared fluorescent cholangiography procedure Note  Indications: This patient presents with symptomatic gallbladder disease and will undergo laparoscopic cholecystectomy.  Pre-operative Diagnosis: symptomatic cholelithiasis  Post-operative Diagnosis: early acute calculous cholecystitis; omental adhesions to anterior abdominal wall, fatty liver  Surgeon: Aldean Hummingbird MD  Assistants: Circulator: Hausheer, Claudine Cullens, RN; Rogue Clear, RN; Little Riff, RNRA Scrub Person: Raylene Calamity; Jaylene Metz, Anette Barb, RN Circulator Assistant: Lendia Quay, RN   Anesthesia: General endotracheal anesthesia   Procedure Details  The patient was seen again in the Holding Room. The risks, benefits, complications, treatment options, and expected outcomes were discussed with the patient. The possibilities of reaction to medication, pulmonary aspiration, perforation of viscus, bleeding, recurrent infection, finding a normal gallbladder, the need for additional procedures, failure to diagnose a condition, the possible need to convert to an open procedure, and creating a complication requiring transfusion or operation were discussed with the patient. The likelihood of improving the patient's symptoms with return to their baseline status is good.  The patient and/or family concurred with the proposed plan, giving informed consent. The site of surgery properly noted. The patient was taken to Operating Room, identified as Lorraine Holt and the procedure verified as Laparoscopic Cholecystectomy with ICG dye.  A Time Out was held and the above information confirmed. Antibiotic prophylaxis was administered.    ICG dye was administered preoperatively.    General endotracheal anesthesia was then administered and tolerated well. After the induction, the abdomen was prepped with Chloraprep and draped in the sterile fashion.  The patient was positioned in the supine position.  Access to the abdomen was obtained via the Optiview technique.  A small incision was made to the left of the midline just below the subcostal margin.  Then using a 0 degree 5 mm laparoscope through a 5 mm trocar the laparoscope was advanced through all layers of the abdominal wall and carefully entered the abdominal cavity.  Pneumoperitoneum was smoothly established up to a patient pressure of 15 mmHg without any change in patient vital signs.  The laparoscope was advanced in the abdominal cavity was surveilled.  There is no evidence of injury to surrounding structures.  However the patient had omental adhesions from below her umbilicus all the way up to the falciform ligament.  We positioned the patient in reverse Trendelenburg, tilted slightly to the patient's left.    Two 5-mm ports were placed in the right upper quadrant.  I then took down some of the abdominal wall and omental adhesions in the supraumbilical position enough to place a 5 mm umbilical trocar under direct visualization.  We also took down some of the omental adhesions near the falciform ligament to the anterior abdominal wall with hook electrocautery as well.   The optical entry trocar was exchanged for a 12 mm trocar. All skin incisions were infiltrated with a local anesthetic agent before making the incision and placing the trocars.     The gallbladder was identified, the fundus grasped and retracted cephalad. Adhesions were lysed bluntly and with the electrocautery where indicated, taking care not to injure any adjacent organs or viscus.  The patient had about a 2-1/2 cm gallstone in the infundibulum.  The infundibulum was grasped and retracted laterally, exposing the peritoneum overlying the triangle of Calot. This was then divided and exposed in a blunt fashion. A critical view of the cystic duct and cystic artery was obtained.  The cystic duct  was clearly identified and bluntly  dissected circumferentially.  Utilizing the Stryker camera system near infrared fluorescent activity was visualized in the liver, cystic duct, common hepatic duct and common bile duct and small bowel.  this served as a secondary confirmation of our anatomy.  The cystic duct was then ligated with 3 clips on the biliary aspect and divided. The cystic artery which had been identified & dissected free was ligated with clips and divided as well.   The gallbladder was dissected from the liver bed in retrograde fashion with the electrocautery. The gallbladder was removed and placed in an Ecco sac.  The liver bed was irrigated and inspected. Hemostasis was achieved with the electrocautery. Copious irrigation was utilized and was repeatedly aspirated until clear.  The gallbladder and Ecco sac were then removed through the left upper quadrant port site.   We again inspected the right upper quadrant for hemostasis.  The left upper quadrant port site was closed with four  0 Vicryl using a PMI suture passer with laparoscopic guidance.  The left upper quadrant closure was inspected and there was no air leak and nothing trapped within the closure. Pneumoperitoneum was released as we removed the trocars.  4-0 Monocryl was used to close the skin.  Dermabond was applied. The patient was then extubated and brought to the recovery room in stable condition. Instrument, sponge, and needle counts were correct at closure and at the conclusion of the case.   Findings: Early acute cholecystitis with Cholelithiasis Positive critical view Fatty liver Fluorescent cholangiography demonstrating activity in the common hepatic duct, common bile duct, cystic duct, small bowel and liver  Estimated Blood Loss: Minimal         Drains: none         Specimens: Gallbladder           Complications: None; patient tolerated the procedure well.         Disposition: PACU - hemodynamically stable.         Condition: stable  Marianna Shirk.  Elvan Hamel, MD, FACS General, Bariatric, & Minimally Invasive Surgery [facility],  [facility]

## 2023-09-23 NOTE — Discharge Instructions (Signed)

## 2023-09-23 NOTE — ED Notes (Signed)
Pre-op nurse at bedside.

## 2023-09-23 NOTE — ED Notes (Signed)
 Patient instructed to remove clothing. Family at bedside.

## 2023-09-23 NOTE — ED Triage Notes (Addendum)
 Sent from UC, with epigastric and back pain x 3 days, some constipation, decreased appetite, nausea

## 2023-09-24 ENCOUNTER — Encounter (HOSPITAL_COMMUNITY): Payer: Self-pay | Admitting: General Surgery

## 2023-09-26 ENCOUNTER — Other Ambulatory Visit: Payer: Self-pay | Admitting: Family

## 2023-09-26 DIAGNOSIS — I10 Essential (primary) hypertension: Secondary | ICD-10-CM

## 2023-09-26 DIAGNOSIS — I110 Hypertensive heart disease with heart failure: Secondary | ICD-10-CM

## 2023-09-27 LAB — SURGICAL PATHOLOGY

## 2023-10-05 ENCOUNTER — Other Ambulatory Visit: Payer: Self-pay | Admitting: Internal Medicine

## 2023-10-05 DIAGNOSIS — I110 Hypertensive heart disease with heart failure: Secondary | ICD-10-CM

## 2023-10-05 DIAGNOSIS — I10 Essential (primary) hypertension: Secondary | ICD-10-CM

## 2023-10-06 ENCOUNTER — Telehealth: Payer: Self-pay | Admitting: Internal Medicine

## 2023-10-06 ENCOUNTER — Encounter: Payer: Self-pay | Admitting: Cardiology

## 2023-10-06 ENCOUNTER — Other Ambulatory Visit: Payer: Self-pay | Admitting: Cardiology

## 2023-10-06 DIAGNOSIS — I10 Essential (primary) hypertension: Secondary | ICD-10-CM

## 2023-10-06 DIAGNOSIS — I110 Hypertensive heart disease with heart failure: Secondary | ICD-10-CM

## 2023-10-06 DIAGNOSIS — E785 Hyperlipidemia, unspecified: Secondary | ICD-10-CM

## 2023-10-06 NOTE — Telephone Encounter (Signed)
 Copied from CRM [id number]. Topic: Clinical - Prescription Issue >> Oct 06, 2023 12:39 PM Ovid Blow wrote: Reason for CRM: Patient called in checking the status of medication indapamide  (LOZOL ) 1.25 MG tablet  Preferred pharmacy: [id number] - [place], [place] - [address] Delivery method: Pickup  Pharmacy tried to reach out to provider. Please contact patient regarding this matter

## 2023-10-07 ENCOUNTER — Other Ambulatory Visit: Payer: Self-pay | Admitting: Family

## 2023-10-07 DIAGNOSIS — I10 Essential (primary) hypertension: Secondary | ICD-10-CM

## 2023-10-07 DIAGNOSIS — I110 Hypertensive heart disease with heart failure: Secondary | ICD-10-CM

## 2023-10-07 MED ORDER — INDAPAMIDE 1.25 MG PO TABS: 1.2500 mg | ORAL_TABLET | Freq: Every day | ORAL | 0 refills | Status: DC

## 2023-10-07 NOTE — Telephone Encounter (Signed)
 Copied from CRM [id number]. Topic: Clinical - Prescription Issue >> Oct 07, 2023  3:14 PM Sophia H wrote: Reason for CRM: pt is needing a fill of indapamide  (LOZOL ) 1.25 MG tablet, stating having issues with the pharmacy. Need filled asap

## 2023-10-10 NOTE — Telephone Encounter (Signed)
 Medication was refilled 10/07/2023

## 2023-10-10 NOTE — Telephone Encounter (Signed)
 Duplicate message. Medication has been refilled.

## 2023-10-13 ENCOUNTER — Ambulatory Visit: Payer: 59 | Admitting: Neurology

## 2023-10-13 ENCOUNTER — Encounter: Payer: Self-pay | Admitting: Neurology

## 2023-10-13 VITALS — BP 136/86 | HR 81 | Ht 70.0 in | Wt 307.0 lb

## 2023-10-13 DIAGNOSIS — R200 Anesthesia of skin: Secondary | ICD-10-CM

## 2023-10-13 DIAGNOSIS — R202 Paresthesia of skin: Secondary | ICD-10-CM | POA: Diagnosis not present

## 2023-10-13 DIAGNOSIS — G4734 Idiopathic sleep related nonobstructive alveolar hypoventilation: Secondary | ICD-10-CM | POA: Diagnosis not present

## 2023-10-13 DIAGNOSIS — M7552 Bursitis of left shoulder: Secondary | ICD-10-CM

## 2023-10-13 DIAGNOSIS — G4733 Obstructive sleep apnea (adult) (pediatric): Secondary | ICD-10-CM | POA: Diagnosis not present

## 2023-10-13 NOTE — Progress Notes (Signed)
 [facility]  PATIENT: Lorraine Holt DOB: 1965-10-29  REFERRING DOCTOR OR PCP: Sandra Crouch, MD SOURCE: Patient, notes from primary care, imaging and lab reports,  _________________________________   HISTORICAL  CHIEF COMPLAINT:  Chief Complaint  Patient presents with   Follow-up    Pt in 10 alone Pt wants to wants to discuss lamictal      HISTORY OF PRESENT ILLNESS:  Lorraine Holt is a 58 y.o. woman with numbness, shoulder pain and sleep apnea.  UPDATE 10/13/2023 She has moderate OSA (AHI = 25.9 on HST 07/20/2022 and SaO2 nadir was 78%.  She  tried CPAP in 2018 (mild OSA with hypoxemia) but had trouble tolerating it so she stopped.   We were hoping to have her try again but insurance was not going to cover well so she opted against.  We discussed treating the hypoxemia and she had access to an oxygen concentrator bu her husband had to ship it back.     She is trying to lose weight - she lost 15 pounds but gained some back when she quit smoking.   Her shoulder is doing much better.   We had done subacromial bursa injections with benefit first on the right then left at last visit.   She has moderate right CTS on NCV but feels better than last visit.  The hand brace and lamotrigine  have helped   EPWORTH SLEEPINESS SCALE  On a scale of 0 - 3 what is the chance of dozing:  Sitting and Reading:   0 Watching TV:    3 Sitting inactive in a public place: 1 Passenger in car for one hour: 0 Lying down to rest in the afternoon: 3 Sitting and talking to someone: 0 Sitting quietly after lunch:  3 In a car, stopped in traffic:  0  Total (out of 24):     11/24  mild EDS   She has NIDDM. 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  REVIEW OF SYSTEMS: Constitutional: No fevers, chills, sweats, or change in appetite Eyes: No visual changes, double vision, eye pain Ear, nose and throat: No hearing loss, ear pain, nasal congestion, sore  throat Cardiovascular: No chest pain, palpitations Respiratory:  No shortness of breath at rest or with exertion.   No wheezes GastrointestinaI: No nausea, vomiting, diarrhea, abdominal pain, fecal incontinence Genitourinary:  No dysuria, urinary retention or frequency.  No nocturia. Musculoskeletal:  No neck pain, back pain Integumentary: No rash, pruritus, skin lesions Neurological: as above Psychiatric: No depression at this time.  No anxiety Endocrine: No palpitations, diaphoresis, change in appetite, change in weigh or increased thirst Hematologic/Lymphatic:  No anemia, purpura, petechiae. Allergic/Immunologic: No itchy/runny eyes, nasal congestion, recent allergic reactions, rashes  ALLERGIES: Allergies  Allergen Reactions   Clindamycin /Lincomycin Swelling    Tongue swelled   Penicillins Swelling and Rash   Amlodipine      Constipation, dizziness    HOME MEDICATIONS:  Current Outpatient Medications:    aspirin  EC 81 MG tablet, Take 81 mg by mouth daily. Swallow whole., Disp: , Rfl:    carvedilol  (COREG ) 3.125 MG tablet, Take 1 tablet (3.125 mg total) by mouth 2 (two) times daily with a meal., Disp: 180 tablet, Rfl: 0   docusate sodium  (COLACE) 50 MG capsule, Take 50 mg by mouth daily., Disp: , Rfl:    empagliflozin  (JARDIANCE ) 25 MG TABS tablet, Take 1 tablet (25 mg total) by mouth daily., Disp: 90 tablet, Rfl: 0   Evolocumab  (REPATHA   SURECLICK) 140 MG/ML SOAJ, INJECT 140 MG INTO THE SKIN EVERY 14 DAYS, Disp: 2 mL, Rfl: 11   ezetimibe  (ZETIA ) 10 MG tablet, Take 1 tablet (10 mg total) by mouth daily., Disp: 90 tablet, Rfl: 3   ibuprofen  (ADVIL ) 200 MG tablet, Take 400 mg by mouth 3 (three) times daily., Disp: , Rfl:    indapamide  (LOZOL ) 1.25 MG tablet, Take 1 tablet (1.25 mg total) by mouth daily., Disp: 90 tablet, Rfl: 0   lamoTRIgine  (LAMICTAL ) 25 MG tablet, Take 2 tablets (50 mg total) by mouth 2 (two) times daily., Disp: 120 tablet, Rfl: 11   metFORMIN  (GLUCOPHAGE -XR) 750  MG 24 hr tablet, Take 1 tablet (750 mg total) by mouth daily with breakfast., Disp: 90 tablet, Rfl: 0   rosuvastatin  (CRESTOR ) 40 MG tablet, Take 1 tablet (40 mg total) by mouth daily., Disp: 90 tablet, Rfl: 1   thiamine  (VITAMIN B-1) 50 MG tablet, Take 1 tablet (50 mg total) by mouth daily., Disp: 90 tablet, Rfl: 1   TRUEPLUS LANCETS 30G MISC, USE TWICE DAILY, Disp: 100 each, Rfl: 11   TRULICITY  1.5 MG/0.5ML SOAJ, INJECT 1.5 MG SUBCUTANEOUSLY ONCE A WEEK, Disp: 4 mL, Rfl: 0   oxyCODONE  (OXY IR/ROXICODONE ) 5 MG immediate release tablet, Take 1 tablet (5 mg total) by mouth every 6 (six) hours as needed for severe pain (pain score 7-10)., Disp: 15 tablet, Rfl: 0  PAST MEDICAL HISTORY: Past Medical History:  Diagnosis Date   Anemia    history age 33'S   Arthritis    Phreesia 02/24/2020   Bronchitis    Carpal tunnel syndrome of right wrist    Diabetes mellitus without complication (HCC)    Phreesia 02/24/2020   Hyperlipidemia    Hypertension    Obesity    Peripheral vascular disease (HCC)    undiagnosis, but complains of numbness in legs   Shortness of breath dyspnea    with exertion   Sleep apnea    Phreesia 02/24/2020   Tooth abscess 09/30/2014   tooth was extracted    PAST SURGICAL HISTORY: Past Surgical History:  Procedure Laterality Date   ABDOMINAL HYSTERECTOMY  09/2015   BILATERAL SALPINGECTOMY Right 09/17/2014   Procedure: RIGHT SALPINGECTOMY;  Surgeon: Lenord Radon, MD;  Location: [facility];  Service: Gynecology;  Laterality: Right;   BREAST BIOPSY Left 03/2014   CHOLECYSTECTOMY Left 09/23/2023   Procedure: LAPAROSCOPIC CHOLECYSTECTOMY;  Surgeon: Aldean Hummingbird, MD;  Location: [facility];  Service: General;  Laterality: Left;   COLONOSCOPY  04/2014   DILATION AND CURETTAGE OF UTERUS     ROBOTIC ASSISTED TOTAL HYSTERECTOMY N/A 09/17/2014   Procedure: ROBOTIC ASSISTED TOTAL HYSTERECTOMY;  Surgeon: Lenord Radon, MD;  Location: [facility];  Service: Gynecology;   Laterality: N/A;   TOOTH EXTRACTION  09/30/2014   TUBAL LIGATION      FAMILY HISTORY: Family History  Problem Relation Age of Onset   Hypertension Mother    Diabetes Mother    Breast cancer Mother    Diabetes Maternal Aunt    Breast cancer Maternal Aunt    Breast cancer Maternal Aunt    Diabetes Maternal Uncle    COPD Neg Hx    Early death Neg Hx    Heart disease Neg Hx    Hyperlipidemia Neg Hx    Kidney disease Neg Hx    Stroke Neg Hx     SOCIAL HISTORY: Social History   Socioeconomic History   Marital status: Married    Spouse name: Not on  file   Number of children: 4   Years of education: Not on file   Highest education level: Some college, no degree  Occupational History   Occupation: [employer]  Tobacco Use   Smoking status: Former    Current packs/day: 0.00    Types: Cigarettes    Quit date: 07/08/2023    Years since quitting: 0.2    Passive exposure: Past   Smokeless tobacco: Never   Tobacco comments:    Pt quit smoking the end of January 2025-08/23/2023  Vaping Use   Vaping status: Not on file  Substance and Sexual Activity   Alcohol  use: No   Drug use: No   Sexual activity: Never    Birth control/protection: Surgical  Other Topics Concern   Not on file  Social History Narrative   Pt lives husband    Pt works    Social Drivers of Corporate investment banker Strain: Low Risk  (08/07/2023)   Overall Financial Resource Strain (CARDIA)    Difficulty of Paying Living Expenses: Not hard at all  Food Insecurity: No Food Insecurity (08/07/2023)   Hunger Vital Sign    Worried About Running Out of Food in the Last Year: Never true    Ran Out of Food in the Last Year: Never true  Transportation Needs: No Transportation Needs (08/07/2023)   PRAPARE - Administrator, Civil Service (Medical): No    Lack of Transportation (Non-Medical): No  Physical Activity: Insufficiently Active (08/07/2023)   Exercise Vital Sign    Days of Exercise per  Week: 2 days    Minutes of Exercise per Session: 10 min  Stress: No Stress Concern Present (08/07/2023)   [employer] of Occupational Health - Occupational Stress Questionnaire    Feeling of Stress : Only a little  Social Connections: Socially Integrated (08/07/2023)   Social Connection and Isolation Panel [NHANES]    Frequency of Communication with Friends and Family: More than three times a week    Frequency of Social Gatherings with Friends and Family: Once a week    Attends Religious Services: More than 4 times per year    Active Member of [employer] or Organizations: Yes    Attends Engineer, structural: More than 4 times per year    Marital Status: Married  Catering manager Violence: Not on file       PHYSICAL EXAM  Vitals:   10/13/23 1456  BP: 136/86  Pulse: 81  Weight: (!) 307 lb (139.3 kg)  Height: 5\' 10"  (1.778 m)    Body mass index is 44.05 kg/m.   General: The patient is an obese woman in no acute distress  HEENT:  Head is [place]/AT.  Sclera are anicteric.    Neck: No carotid bruits are noted.  The neck is nontender.  Skin: She has a callus adjacent to the second toe on the right.  Musculoskeletal: She has reduced ROM in right shoulder.   She has mildly reduced range of motion in the right shoulder.  The subacromial bursa pain is improved compared to the last visit.  Neurologic Exam  Mental status: The patient is alert and oriented x 3 at the time of the examination. The patient has apparent normal recent and remote memory, with an apparently normal attention span and concentration ability.   Speech is normal.  Cranial nerves: Extraocular movements are full. PFacial stregth and sesation were normal.    . No obvious hearing deficits are noted.  Motor:  Muscle bulk is normal.   Tone is normal. Strength is  5 / 5 in all 4 extremities except 4+/5 right APB.   Sensory: Today, sensation was normal in the hands  Other:  Tinels sign at the right wrist.    Coordination: Cerebellar testing reveals good finger-nose-finger and heel-to-shin bilaterally.  Gait and station: Station is normal.   Gait is normal. Tandem gait is normal. Romberg is negative.   Reflexes: Deep tendon reflexes are symmetric and normal bilaterally.      DIAGNOSTIC DATA (LABS, IMAGING, TESTING) - I reviewed patient records, labs, notes, testing and imaging myself where available.  Lab Results  Component Value Date   WBC 5.7 09/23/2023   HGB 14.7 09/23/2023   HCT 47.5 (H) 09/23/2023   MCV 91.3 09/23/2023   PLT 212 09/23/2023      Component Value Date/Time   NA 137 09/23/2023 1126   NA 141 02/29/2020 1115   K 3.5 09/23/2023 1126   CL 105 09/23/2023 1126   CO2 24 09/23/2023 1126   GLUCOSE 103 (H) 09/23/2023 1126   BUN 13 09/23/2023 1126   BUN 14 02/29/2020 1115   CREATININE 0.63 09/23/2023 1126   CALCIUM  9.3 09/23/2023 1126   PROT 7.6 09/23/2023 1126   PROT 7.2 02/29/2020 1115   ALBUMIN 3.9 09/23/2023 1126   ALBUMIN 4.1 02/29/2020 1115   AST 25 09/23/2023 1126   ALT 44 09/23/2023 1126   ALKPHOS 140 (H) 09/23/2023 1126   BILITOT 1.0 09/23/2023 1126   BILITOT 0.2 02/29/2020 1115   GFRNONAA >60 09/23/2023 1126   GFRAA 97 02/29/2020 1115   Lab Results  Component Value Date   CHOL 100 09/19/2023   HDL 55 09/19/2023   LDLCALC 31 09/19/2023   TRIG 61 09/19/2023   CHOLHDL 1.8 09/19/2023   Lab Results  Component Value Date   HGBA1C 6.9 (H) 08/11/2023   Lab Results  Component Value Date   VITAMINB12 683 12/16/2021   Lab Results  Component Value Date   TSH 4.27 08/11/2023       ASSESSMENT AND PLAN  Nocturnal hypoxemia  OSA (obstructive sleep apnea)  Bursitis of left shoulder  Numbness and tingling   She is willing to reconsider CPAP.  However, if her co-pay is high she is going to hold off.   We did discuss weight loss Shoulder pain has done well since her subacromial bursa injection Stay active and exercise as tolerated to help  with weight loss.    Return in 12 months or sooner if there are new or worsening neurologic symptoms.     Correna Meacham A. Godwin Lat, MD, [facility] 10/13/2023, 3:35 PM Certified in Neurology, Clinical Neurophysiology, Sleep Medicine and Neuroimaging  [facility] [address], [address] [place], [place] [address] [phone]

## 2023-10-17 ENCOUNTER — Other Ambulatory Visit: Payer: Self-pay | Admitting: *Deleted

## 2023-10-17 DIAGNOSIS — G4733 Obstructive sleep apnea (adult) (pediatric): Secondary | ICD-10-CM

## 2023-10-18 ENCOUNTER — Ambulatory Visit
Admission: RE | Admit: 2023-10-18 | Discharge: 2023-10-18 | Disposition: A | Discharge status: Home / Self Care | Source: Ambulatory Visit | Attending: Acute Care | Admitting: Acute Care

## 2023-10-18 DIAGNOSIS — R911 Solitary pulmonary nodule: Secondary | ICD-10-CM

## 2023-10-18 DIAGNOSIS — Z87891 Personal history of nicotine dependence: Secondary | ICD-10-CM

## 2023-10-18 DIAGNOSIS — E041 Nontoxic single thyroid nodule: Secondary | ICD-10-CM | POA: Diagnosis not present

## 2023-10-18 DIAGNOSIS — Z122 Encounter for screening for malignant neoplasm of respiratory organs: Secondary | ICD-10-CM

## 2023-10-18 DIAGNOSIS — I2510 Atherosclerotic heart disease of native coronary artery without angina pectoris: Secondary | ICD-10-CM | POA: Diagnosis not present

## 2023-10-18 DIAGNOSIS — R918 Other nonspecific abnormal finding of lung field: Secondary | ICD-10-CM | POA: Diagnosis not present

## 2023-10-20 HISTORY — PX: GALLBLADDER SURGERY: SHX652

## 2023-10-24 ENCOUNTER — Telehealth: Payer: Self-pay

## 2023-10-24 NOTE — Telephone Encounter (Signed)
 Called patient to inform them that scan will not be read in time for tomorrow and we will need tor reschedule

## 2023-10-25 ENCOUNTER — Ambulatory Visit: Admitting: Acute Care

## 2023-11-02 ENCOUNTER — Other Ambulatory Visit: Payer: Self-pay | Admitting: Internal Medicine

## 2023-11-02 DIAGNOSIS — I5032 Chronic diastolic (congestive) heart failure: Secondary | ICD-10-CM

## 2023-11-02 DIAGNOSIS — E118 Type 2 diabetes mellitus with unspecified complications: Secondary | ICD-10-CM

## 2023-11-07 ENCOUNTER — Telehealth: Payer: Self-pay | Admitting: *Deleted

## 2023-11-07 NOTE — Telephone Encounter (Signed)
 Call report from [facility]:   IMPRESSION: 1. 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 Findings remain worrisome for indolent adenocarcinoma. Lung-RADS 4A, suspicious. Follow up low-dose chest CT without contrast in 3 months (please use the following order, "CT CHEST LCS NODULE FOLLOW-UP W/O CM") is recommended. Alternatively, PET may be considered when there is a solid component 8mm or larger. These results will be called to the ordering clinician or representative by the Radiologist Assistant, and communication documented in the PACS or [employer]. 2. Age advanced left anterior descending coronary artery calcification. 3. 2.1 cm right thyroid  nodule. Recommend thyroid  ultrasound. (Ref: J Am Coll Radiol. 2015 Feb;12(2): 143-50). 4.  Aortic atherosclerosis (ICD10-I70.0). 5.  Emphysema (ICD10-J43.9).

## 2023-11-08 ENCOUNTER — Ambulatory Visit (INDEPENDENT_AMBULATORY_CARE_PROVIDER_SITE_OTHER): Admitting: Acute Care

## 2023-11-08 ENCOUNTER — Encounter: Payer: Self-pay | Admitting: Emergency Medicine

## 2023-11-08 ENCOUNTER — Encounter: Payer: Self-pay | Admitting: Acute Care

## 2023-11-08 ENCOUNTER — Telehealth: Payer: Self-pay

## 2023-11-08 DIAGNOSIS — R911 Solitary pulmonary nodule: Secondary | ICD-10-CM | POA: Diagnosis not present

## 2023-11-08 DIAGNOSIS — F17210 Nicotine dependence, cigarettes, uncomplicated: Secondary | ICD-10-CM | POA: Diagnosis not present

## 2023-11-08 DIAGNOSIS — Z87891 Personal history of nicotine dependence: Secondary | ICD-10-CM

## 2023-11-08 NOTE — H&P (View-Only) (Signed)
 Virtual Visit via Video Note  I connected with Lorraine Holt on 11/08/23 at 11:30 AM EDT by a video enabled telemedicine application and verified that I am speaking with the correct person using two identifiers.  Location: Patient:  At home Provider: [address] [place], [place], [address]    I discussed the limitations of evaluation and management by telemedicine and the availability of in person appointments. The patient expressed understanding and agreed to proceed.  Synopsis Lorraine Holt is a 58 y.o. female some day smoker ( 34 pack year smoking history) followed through the lung cancer screening program for an abnormal screening scan 07/04/2023.   History of Present Illness: Pt. Presents for follow up after repeat CT Chest as surveillance of a pulmonary nodule.  There is a very slowly growing apical right upper lobe part solid 1.8 cm pulmonary nodule with a 0.4 centimeter solid component.  This is increased from 1.4 cm diam by 0.2 cm in the last 6 months.  PET scan was ordered to better evaluate this finding.  It showed that the subsolid right upper lobe nodule has a maximum SUV of 1.6.  Per radiology, this low-level activity could be associated with a benign inflammatory/postinflammatory process or low-grade adenocarcinoma. Particular attention to morphologic changes and size changes in the lesion recommended in determining whether surveillance or tissue sampling/resection is most appropriate.   Lorraine Holt  and I discussed this finding.  She stated that she quit smoking in January 2025 , however she did smell like cigarette smoke today in the office that day.  We discussed watchful waiting with a 63-month follow-up scan as looking back she does feel that there is potential she had an upper respiratory infection around the time of the scan.   I explained I do not want a wait long-term so we will do a repeat scan in 3 months, if this area continues to look concerning we will move  forward with a biopsy.  Patient is in agreement with this plan.  I asked her to call the office to be seen sooner if she develops hemoptysis or unexplained weight loss.  She denies a chronic cough.   We have reviewed the repeat Ct Chest that was done 10/2023. This showed the right upper pulmonary nodule while stable, morphologically is concerning for an indolent adenocarcinoma. We had discussed moving forward with biopsy if this nodule continued to look concerning. She is in agreement with moving forward with biopsy.We have reviewed the risks of  bleeding, infection, puncture of the lung, and adverse reaction to anesthesia. She has made an informed decision to have the biopsy. She is on ASA daily, which I have instructed her to hold the day before and the day of the procedure. She is on Trulicity , which she will hold her 11/11/2023 dose so she will have been off her medication for over 1 week.     Observations/Objective: 10/18/2023 LDCT Follow up   Apical segment right upper lobe atypical pulmonary cyst with eccentric ground-glass wall thickening and nodularity, stable from 07/04/2023. Findings remain worrisome for indolent adenocarcinoma. Lung-RADS 4A, suspicious. Follow up low-dose chest CT without contrast in 3 months (please use the following order, "CT CHEST LCS NODULE FOLLOW-UP W/O CM") is recommended. Alternatively, PET may be considered when there is a solid component 8mm or larger. These results will be called to the ordering clinician or representative by the Radiologist Assistant, and communication documented in the PACS or [employer]. Age advanced left anterior  descending coronary artery calcification. 2.1 cm right thyroid  nodule. Recommend thyroid  ultrasound. (Ref: J Am Coll Radiol. 2015 Feb;12(2): 143-50). Aortic atherosclerosis (ICD10-I70.0). Emphysema (ICD10-J43.9).  PET Scan 08/02/2023 The subsolid right upper lobe nodule has a maximum SUV of 1.6. This low-level activity  could be associated with a benign inflammatory/postinflammatory process or low-grade adenocarcinoma. Particular attention to morphologic changes and size changes in the lesion recommended in determining whether surveillance or tissue sampling/resection is most appropriate. 2. Upper normal sized 0.9 cm in short axis right paratracheal lymph node, no accentuated metabolic activity. Likely benign. 3. Suspected gallstone measuring up to 2.5 cm in the neck of the gallbladder. 4. Lower lumbar spondylosis. 5. Aortic and coronary atherosclerosis.  LDCT Chest 07/04/2023 Lungs/Pleura: No pneumothorax. No pleural effusion. Mild centrilobular emphysema with diffuse bronchial wall thickening. No acute consolidative airspace disease or lung masses. Peripheral apical right upper lobe part solid 1.8 x 1.7 cm pulmonary nodule with 0.4 cm solid component (series 3/image 48), previously 1.7 x 1.6 cm with 0.4 cm solid component on 07/01/2022 CT and 1.4 x 1.1 cm with 0.2 cm solid component on baseline 12/10/2020 CT, very slowly growing. A few new clustered indistinct pulmonary nodules in the basilar right lower lobe up to 6.8 mm on series 3/image 193. Lung-RADS 4B, suspicious. Additional imaging evaluation or consultation with Pulmonology or Thoracic Surgery recommended. Very slowly growing apical right upper lobe part solid 1.8 cm pulmonary nodule with 0.4 cm solid component, increased from 1.4 cm by 0.2 cm solid component on baseline 12/10/2020 CT, cannot exclude indolent/low-grade primary bronchogenic adenocarcinoma. 2. A few new clustered indistinct pulmonary nodules in the basilar right lower lobe up to 6.8 mm, favoring a mild infectious/inflammatory bronchiolitis. 3. One vessel coronary atherosclerosis. 4. Cholelithiasis. 5. Aortic Atherosclerosis (ICD10-I70.0) and Emphysema (ICD10-J43.9).       Assessment and Plan: Continued Concern for Right Upper Lobe Lung Nodule Recently Former smoker   Plan The CT Chest continues to show the right upper lobe atypical pulmonary cysy is stable, but remains concerning for indolent adenocarcinoma. You prefer to go ahead with biopsy vs watchful waiting with a 3 month follow up CT Chest. You would prefer to proceed with biopsy. I have placed an order for a bronchoscopy with biopsies.  We have discussed the procedure in detail.  We have reviewed the risks and benefits of the procedure. These include bleeding, infection, puncture of the lung, and adverse reaction to anesthesia. You have agreed to proceed with biopsy to evaluate the lung nodule of concern Your procedure will be done by Dr. Racheal Buddle. You will receive a letter today with date time and information pertaining to the procedure. You will need someone to drive you to the procedure, stay with you during the procedure, and stay with you after the procedure. You will also need someone to stay with you for 24 hours after anesthesia to ensure you have cleared and are doing well. You will follow-up with me 1 week after the procedure to review the results and to ensure you are doing well. Hold ASA the day before and the day of the procedure. Hold Trulicity  dose 11/11/2023, so you have been off the medication for > 7 days at the time of the procedure. You should get a call from anesthesia to give instructions about medications before the procedure.  Call if you need us  prior to the procedure or if you have any questions at all. Please contact office for sooner follow up if symptoms do not improve or  worsen or seek emergency care   Follow Up Instructions: Follow up one week after Procedure with me to review the results and determine next best steps in plan of care.  Please see Letter with additional instructions    I discussed the assessment and treatment plan with the patient. The patient was provided an opportunity to ask questions and all were answered. The patient agreed with the plan and  demonstrated an understanding of the instructions.   The patient was advised to call back or seek an in-person evaluation if the symptoms worsen or if the condition fails to improve as anticipated.  I spent 40 minutes dedicated to the care of this patient on the date of this encounter to include pre-visit review of records, face-to-face time with the patient discussing conditions above, post visit ordering of testing, planning and ordering a bronchoscopy with biopsies , clinical documentation with the electronic health record, making appropriate referrals as documented, and communicating necessary information to the patient's healthcare team.   Raejean Bullock, NP

## 2023-11-08 NOTE — Progress Notes (Signed)
 Virtual Visit via Video Note  I connected with Lorraine Holt on 11/08/23 at 11:30 AM EDT by a video enabled telemedicine application and verified that I am speaking with the correct person using two identifiers.  Location: Patient:  At home Provider: [address] [place], [place], [address]    I discussed the limitations of evaluation and management by telemedicine and the availability of in person appointments. The patient expressed understanding and agreed to proceed.  Synopsis Lorraine Holt is a 58 y.o. female some day smoker ( 34 pack year smoking history) followed through the lung cancer screening program for an abnormal screening scan 07/04/2023.   History of Present Illness: Pt. Presents for follow up after repeat CT Chest as surveillance of a pulmonary nodule.  There is a very slowly growing apical right upper lobe part solid 1.8 cm pulmonary nodule with a 0.4 centimeter solid component.  This is increased from 1.4 cm diam by 0.2 cm in the last 6 months.  PET scan was ordered to better evaluate this finding.  It showed that the subsolid right upper lobe nodule has a maximum SUV of 1.6.  Per radiology, this low-level activity could be associated with a benign inflammatory/postinflammatory process or low-grade adenocarcinoma. Particular attention to morphologic changes and size changes in the lesion recommended in determining whether surveillance or tissue sampling/resection is most appropriate.   Lorraine Holt  and I discussed this finding.  She stated that she quit smoking in January 2025 , however she did smell like cigarette smoke today in the office that day.  We discussed watchful waiting with a 63-month follow-up scan as looking back she does feel that there is potential she had an upper respiratory infection around the time of the scan.   I explained I do not want a wait long-term so we will do a repeat scan in 3 months, if this area continues to look concerning we will move  forward with a biopsy.  Patient is in agreement with this plan.  I asked her to call the office to be seen sooner if she develops hemoptysis or unexplained weight loss.  She denies a chronic cough.   We have reviewed the repeat Ct Chest that was done 10/2023. This showed the right upper pulmonary nodule while stable, morphologically is concerning for an indolent adenocarcinoma. We had discussed moving forward with biopsy if this nodule continued to look concerning. She is in agreement with moving forward with biopsy.We have reviewed the risks of  bleeding, infection, puncture of the lung, and adverse reaction to anesthesia. She has made an informed decision to have the biopsy. She is on ASA daily, which I have instructed her to hold the day before and the day of the procedure. She is on Trulicity , which she will hold her 11/11/2023 dose so she will have been off her medication for over 1 week.     Observations/Objective: 10/18/2023 LDCT Follow up   Apical segment right upper lobe atypical pulmonary cyst with eccentric ground-glass wall thickening and nodularity, stable from 07/04/2023. Findings remain worrisome for indolent adenocarcinoma. Lung-RADS 4A, suspicious. Follow up low-dose chest CT without contrast in 3 months (please use the following order, "CT CHEST LCS NODULE FOLLOW-UP W/O CM") is recommended. Alternatively, PET may be considered when there is a solid component 8mm or larger. These results will be called to the ordering clinician or representative by the Radiologist Assistant, and communication documented in the PACS or [employer]. Age advanced left anterior  descending coronary artery calcification. 2.1 cm right thyroid  nodule. Recommend thyroid  ultrasound. (Ref: J Am Coll Radiol. 2015 Feb;12(2): 143-50). Aortic atherosclerosis (ICD10-I70.0). Emphysema (ICD10-J43.9).  PET Scan 08/02/2023 The subsolid right upper lobe nodule has a maximum SUV of 1.6. This low-level activity  could be associated with a benign inflammatory/postinflammatory process or low-grade adenocarcinoma. Particular attention to morphologic changes and size changes in the lesion recommended in determining whether surveillance or tissue sampling/resection is most appropriate. 2. Upper normal sized 0.9 cm in short axis right paratracheal lymph node, no accentuated metabolic activity. Likely benign. 3. Suspected gallstone measuring up to 2.5 cm in the neck of the gallbladder. 4. Lower lumbar spondylosis. 5. Aortic and coronary atherosclerosis.  LDCT Chest 07/04/2023 Lungs/Pleura: No pneumothorax. No pleural effusion. Mild centrilobular emphysema with diffuse bronchial wall thickening. No acute consolidative airspace disease or lung masses. Peripheral apical right upper lobe part solid 1.8 x 1.7 cm pulmonary nodule with 0.4 cm solid component (series 3/image 48), previously 1.7 x 1.6 cm with 0.4 cm solid component on 07/01/2022 CT and 1.4 x 1.1 cm with 0.2 cm solid component on baseline 12/10/2020 CT, very slowly growing. A few new clustered indistinct pulmonary nodules in the basilar right lower lobe up to 6.8 mm on series 3/image 193. Lung-RADS 4B, suspicious. Additional imaging evaluation or consultation with Pulmonology or Thoracic Surgery recommended. Very slowly growing apical right upper lobe part solid 1.8 cm pulmonary nodule with 0.4 cm solid component, increased from 1.4 cm by 0.2 cm solid component on baseline 12/10/2020 CT, cannot exclude indolent/low-grade primary bronchogenic adenocarcinoma. 2. A few new clustered indistinct pulmonary nodules in the basilar right lower lobe up to 6.8 mm, favoring a mild infectious/inflammatory bronchiolitis. 3. One vessel coronary atherosclerosis. 4. Cholelithiasis. 5. Aortic Atherosclerosis (ICD10-I70.0) and Emphysema (ICD10-J43.9).       Assessment and Plan: Continued Concern for Right Upper Lobe Lung Nodule Recently Former smoker   Plan The CT Chest continues to show the right upper lobe atypical pulmonary cysy is stable, but remains concerning for indolent adenocarcinoma. You prefer to go ahead with biopsy vs watchful waiting with a 3 month follow up CT Chest. You would prefer to proceed with biopsy. I have placed an order for a bronchoscopy with biopsies.  We have discussed the procedure in detail.  We have reviewed the risks and benefits of the procedure. These include bleeding, infection, puncture of the lung, and adverse reaction to anesthesia. You have agreed to proceed with biopsy to evaluate the lung nodule of concern Your procedure will be done by Dr. Racheal Buddle. You will receive a letter today with date time and information pertaining to the procedure. You will need someone to drive you to the procedure, stay with you during the procedure, and stay with you after the procedure. You will also need someone to stay with you for 24 hours after anesthesia to ensure you have cleared and are doing well. You will follow-up with me 1 week after the procedure to review the results and to ensure you are doing well. Hold ASA the day before and the day of the procedure. Hold Trulicity  dose 11/11/2023, so you have been off the medication for > 7 days at the time of the procedure. You should get a call from anesthesia to give instructions about medications before the procedure.  Call if you need us  prior to the procedure or if you have any questions at all. Please contact office for sooner follow up if symptoms do not improve or  worsen or seek emergency care   Follow Up Instructions: Follow up one week after Procedure with me to review the results and determine next best steps in plan of care.  Please see Letter with additional instructions    I discussed the assessment and treatment plan with the patient. The patient was provided an opportunity to ask questions and all were answered. The patient agreed with the plan and  demonstrated an understanding of the instructions.   The patient was advised to call back or seek an in-person evaluation if the symptoms worsen or if the condition fails to improve as anticipated.  I spent 40 minutes dedicated to the care of this patient on the date of this encounter to include pre-visit review of records, face-to-face time with the patient discussing conditions above, post visit ordering of testing, planning and ordering a bronchoscopy with biopsies , clinical documentation with the electronic health record, making appropriate referrals as documented, and communicating necessary information to the patient's healthcare team.   Lorraine Bullock, NP

## 2023-11-08 NOTE — Telephone Encounter (Signed)
 Called patient to go over bronch letter

## 2023-11-08 NOTE — Patient Instructions (Addendum)
 It was good to see you today. The CT Chest continues to show the right upper lobe atypical pulmonary cysy is stable, but remains concerning for indolent adenocarcinoma. You prefer to go ahead with biopsy vs watchful waiting with a 3 month follow up CT Chest. You would prefer to proceed with biopsy. I have placed an order for a bronchoscopy with biopsies.  We have discussed the procedure in detail.  We have reviewed the risks and benefits of the procedure. These include bleeding, infection, puncture of the lung, and adverse reaction to anesthesia. You have agreed to proceed with biopsy to evaluate the lung nodule of concern Your procedure will be done by Dr. Racheal Buddle. You will receive a letter today with date time and information pertaining to the procedure. You will need someone to drive you to the procedure, stay with you during the procedure, and stay with you after the procedure. You will also need someone to stay with you for 24 hours after anesthesia to ensure you have cleared and are doing well. You will follow-up with me 1 week after the procedure to review the results and to ensure you are doing well. Call if you need us  prior to the procedure or if you have any questions at all. Please contact office for sooner follow up if symptoms do not improve or worsen or seek emergency care

## 2023-11-10 NOTE — Telephone Encounter (Signed)
 Patient's LDCT was resulted on 11/07/2023 and was seen by Dara Ear NP in office on 11/08/2023. Patient was given results of scan at that appt. Patient is now scheduled for bronch on 11/15/23 with Dr. Baldwin Levee.

## 2023-11-14 ENCOUNTER — Other Ambulatory Visit: Payer: Self-pay | Admitting: Internal Medicine

## 2023-11-14 ENCOUNTER — Other Ambulatory Visit: Payer: Self-pay | Admitting: Neurology

## 2023-11-14 ENCOUNTER — Other Ambulatory Visit: Payer: Self-pay

## 2023-11-14 ENCOUNTER — Encounter (HOSPITAL_COMMUNITY): Payer: Self-pay | Admitting: Emergency Medicine

## 2023-11-14 DIAGNOSIS — E118 Type 2 diabetes mellitus with unspecified complications: Secondary | ICD-10-CM

## 2023-11-14 NOTE — Anesthesia Preprocedure Evaluation (Signed)
 Anesthesia Evaluation  Patient identified by MRN, date of birth, ID band Patient awake    Reviewed: Allergy & Precautions, H&P , NPO status , Patient's Chart, lab work & pertinent test results  Airway Mallampati: III  TM Distance: >3 FB Neck ROM: Full    Dental no notable dental hx. (+) Loose,    Pulmonary shortness of breath and with exertion, sleep apnea , COPD, Current Smoker and Patient abstained from smoking.   Pulmonary exam normal breath sounds clear to auscultation       Cardiovascular hypertension, Pt. on medications and Pt. on home beta blockers + Peripheral Vascular Disease   Rhythm:Regular Rate:Normal     Neuro/Psych negative neurological ROS  negative psych ROS   GI/Hepatic negative GI ROS, Neg liver ROS,,,  Endo/Other  diabetes, Type 2, Oral Hypoglycemic Agents  Class 3 obesity  Renal/GU negative Renal ROS  negative genitourinary   Musculoskeletal  (+) Arthritis , Osteoarthritis,    Abdominal   Peds  Hematology  (+) Blood dyscrasia, anemia   Anesthesia Other Findings   Reproductive/Obstetrics negative OB ROS                             Anesthesia Physical Anesthesia Plan  ASA: 3  Anesthesia Plan: General   Post-op Pain Management: Tylenol  PO (pre-op)*   Induction: Intravenous  PONV Risk Score and Plan: 3 and Ondansetron , Dexamethasone , Propofol  infusion, TIVA and Midazolam   Airway Management Planned: Oral ETT  Additional Equipment:   Intra-op Plan:   Post-operative Plan: Extubation in OR  Informed Consent: I have reviewed the patients History and Physical, chart, labs and discussed the procedure including the risks, benefits and alternatives for the proposed anesthesia with the patient or authorized representative who has indicated his/her understanding and acceptance.     Dental advisory given  Plan Discussed with: CRNA  Anesthesia Plan Comments: (PAT  note written 11/14/2023 by Kavian Peters, PA-C.  )       Anesthesia Quick Evaluation

## 2023-11-14 NOTE — Progress Notes (Signed)
 Anesthesia Chart Review: SAME DAY WORK-UP  Case: [id number] Date/Time: 11/15/23 1100   Procedure: BRONCHOSCOPY, WITH BIOPSY USING ELECTROMAGNETIC NAVIGATION (Right)   Anesthesia type: General   Diagnosis: Lung nodule seen on imaging study [R91.1]   Pre-op diagnosis: Right upper lobe atypical pulmonary cyst   [facility] / [facility]   Surgeons: Denson Flake, MD       DISCUSSION: Patient is a 58 year old female scheduled for the above procedure. 10/18/23 serival LCS chest CT showed an apical RUL atypical pulmonary cyst with eccentric ground-glass wall thickening and nodularity, stable from 07/04/2023, but remain concerning for indolent adenocarcinoma. Biopsy planned.   History includes former smoker (quit 06/2023), HTN, HLD, DM2, elevated Coronary calcium  score, PAD (normal ABIs 03/06/22), anemia, exertional dyspnea, OSA (intolerant to CPAP), cholecystectomy (09/23/23), RUL lung cyst, right thyroid  nodule (10/18/23 CT), hysterectomy (09/17/14).  She was [place] cardiologist [place] [id number] for evaluation of coronary atherosclerosis on CT imaging.  Reported DOE with more vigorous activities but not with routine activities. No chest pain or syncope. Possibly LE claudication symptoms, so ABIs ordered but were normal on 03/07/23. A CT cardiac calcium  score was ordered and done on 03/24/23 and was elevated at 182 (95% percentile). He advised continuing ASA and statin and referred her for PCSK9I. Smoking cessation encouraged.   Dr. Baldwin Levee provided the following instructions to patient:  Please stop the following medications:  Hold ASA day prior to procedure, Hold Trulicity  this Friday( Last dose was 11/04/2023) This will be > than 1 week after last dose.   Anesthesia team to evaluation on the day of surgery. PAT phone RN to inquire about last Jardiance  dose.    VS: LMP 08/08/2014  Wt Readings from Last 3 Encounters:  10/13/23 (!) 139.3 kg  09/23/23 (!) 138.8 kg   08/23/23 (!) 139.6 kg   BP Readings from Last 3 Encounters:  10/13/23 136/86  09/23/23 (!) 131/104  08/23/23 118/78   Pulse Readings from Last 3 Encounters:  10/13/23 81  09/23/23 85  08/23/23 90     PROVIDERS: Arcadio Knuckles, MD is PCP  Alexandria Angel, MD is cardiologist.  Hortensia Ma, MD is neurologist, last visit 10/13/23 for follow-up OSA, right CTS, left shoulder bursitis.   LABS: For day of surgery as indicated. Most recent results in [facility] include: Lab Results  Component Value Date   WBC 5.7 09/23/2023   HGB 14.7 09/23/2023   HCT 47.5 (H) 09/23/2023   PLT 212 09/23/2023   GLUCOSE 103 (H) 09/23/2023   CHOL 100 09/19/2023   TRIG 61 09/19/2023   HDL 55 09/19/2023   LDLCALC 31 09/19/2023   ALT 44 09/23/2023   AST 25 09/23/2023   NA 137 09/23/2023   K 3.5 09/23/2023   CL 105 09/23/2023   CREATININE 0.63 09/23/2023   BUN 13 09/23/2023   CO2 24 09/23/2023   TSH 4.27 08/11/2023   HGBA1C 6.9 (H) 08/11/2023   MICROALBUR <0.7 08/11/2023    Home Sleep Study 07/20/22: IMPRESSION: Moderate obstructive sleep apnea with pAHI 3% = 25.9 (severe during REM sleep with pAHI = 53.5.  The ODI 4% was 15.3/h (severe during REM at 40.1/h) Mild nocturnal hypoxemia with 7.2 minutes of sleep below SaO2 of 88% and 2.9 minutes below 85% Mildly reduced sleep efficiency of 77%    IMAGES: CT Chest [facility] 10/18/23: IMPRESSION: 1. 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 Findings  remain worrisome for indolent adenocarcinoma. Lung-RADS 4A, suspicious. Follow up low-dose chest CT without contrast in 3 months (please use the following order, "CT CHEST LCS NODULE FOLLOW-UP W/O CM") is recommended. Alternatively, PET may be considered when there is a solid component 8mm or larger. These results will be called to the ordering clinician or representative by the Radiologist Assistant, and  communication documented in the PACS or [employer]. 2. Age advanced left anterior descending coronary artery calcification. 3. 2.1 cm right thyroid  nodule. Recommend thyroid  ultrasound. (Ref: J Am Coll Radiol. 2015 Feb;12(2): 143-50). 4.  Aortic atherosclerosis (ICD10-I70.0). 5.  Emphysema (ICD10-J43.9).  PET Scan 08/02/23: IMPRESSION: 1. The subsolid right upper lobe nodule has a maximum SUV of 1.6. This low-level activity could be associated with a benign inflammatory/postinflammatory process or low-grade adenocarcinoma. Particular attention to morphologic changes and size changes in the lesion recommended in determining whether surveillance or tissue sampling/resection is most appropriate. 2. Upper normal sized 0.9 cm in short axis right paratracheal lymph node, no accentuated metabolic activity. Likely benign. 3. Suspected gallstone measuring up to 2.5 cm in the neck of the gallbladder. 4. Lower lumbar spondylosis. 5. Aortic and coronary atherosclerosis. Aortic Atherosclerosis (ICD10-I70.0).   EKG: 09/23/23: Consider left atrial enlargement Low voltage, precordial leads no acute ST/T changes similar to Sept 2024 Confirmed by Jerilynn Montenegro [id number]) on 09/23/2023 11:10:42 AM   CV: CT Cardiac Calcium  Scoring 03/24/23: IMPRESSION: 1. Coronary calcium  score of 182. This was 95th percentile for age, gender, and race matched controls. 2. Evidence of mild ascending aortic dilation, 41 mm, on non-contrasted study. Consider secondary imaging modality (echocardiogram, CTA Aorta Protocol, MRA Aorta Protocol) if clinically indicated. 3.  Aortic atherosclerosis.   RECOMMENDATIONS: The proposed cut-off value of 1,651 AU yielded a 93 % sensitivity and 75 % specificity in grading AS severity in patients with classical low-flow, low-gradient AS. Proposed different cut-off values to define severe AS for men and women as 2,065 AU and 1,274 AU, respectively. The joint European and  American recommendations for the assessment of AS consider the aortic valve calcium  score as a continuum - a very high calcium  score suggests severe AS and a low calcium  score suggests severe AS is unlikely.  Florette Hurry, et al. 2017 ESC/EACTS Guidelines for the management of valvular heart disease. Eur Heart J 2017;38:2739-91.   Coronary artery calcium  (CAC) score is a strong predictor of incident coronary heart disease (CHD) and provides predictive information beyond traditional risk factors. CAC scoring is reasonable to use in the decision to withhold, postpone, or initiate statin therapy in intermediate-risk or selected borderline-risk asymptomatic adults (age 49-75 years and LDL-C >=70 to <190 mg/dL) who do not have diabetes or established atherosclerotic cardiovascular disease (ASCVD).* In intermediate-risk (10-year ASCVD risk >=7.5% to <20%) adults or selected borderline-risk (10-year ASCVD risk >=5% to <7.5%) adults in whom a CAC score is measured for the purpose of making a treatment decision the following recommendations have been made:...    If CAC is >=100 or >=75th percentile, it is reasonable to initiate statin therapy at any age.   Cardiology referral should be considered for patients with CAC scores =400 or >=75th percentile.   *2018 AHA/ACC/AACVPR/AAPA/ABC/ACPM/ADA/AGS/APhA/ASPC/NLA/PCNA Guideline on the Management of Blood Cholesterol: A Report of the American College of Cardiology/American Heart Association Task Force on Clinical Practice Guidelines. J Am Coll Cardiol. 2019;73(24):3168-3209.   Echo 12/28/17: Study Conclusions  - Procedure narrative: Transthoracic echocardiography. Technically    difficulty study with reduced echo windows.  Intravenous contrast    (Definity ) was administered.  - Left ventricle: The cavity size was normal. There was severe    concentric hypertrophy. Systolic function was normal. The    estimated ejection fraction was in  the range of 60% to 65%. Wall    motion was normal; there were no regional wall motion    abnormalities. Doppler parameters are consistent with abnormal    left ventricular relaxation (grade 1 diastolic dysfunction). The    E/e&' ratio is between 8-15, suggesting indeterminate LV filling    pressure.  - Left atrium: The atrium was normal in size.  - Inferior vena cava: The vessel was normal in size. The    respirophasic diameter changes were in the normal range (= 50%),    consistent with normal central venous pressure.   Impressions:  - Technically difficult study. Definity  contrast given. LVEF    60-65%, severe LVH, normal wall motion, grade 1 DD, indeterminate    LV filling pressure, normal LA size, normal IVC.    Cardiac event monitor 10/06/16: Normal sinus rhythym No rhythm disturbance correlates with complaints   Normal study    Past Medical History:  Diagnosis Date   Anemia    history age 31'S   Arthritis    [employer] 02/24/2020   Bronchitis    Carpal tunnel syndrome of right wrist    Diabetes mellitus without complication (HCC)    [employer] 02/24/2020   Hyperlipidemia    Hypertension    Obesity    Peripheral vascular disease (HCC)    undiagnosis, but complains of numbness in legs   Shortness of breath dyspnea    with exertion   Sleep apnea    [employer] 02/24/2020   Tooth abscess 09/30/2014   tooth was extracted    Past Surgical History:  Procedure Laterality Date   ABDOMINAL HYSTERECTOMY  09/2015   BILATERAL SALPINGECTOMY Right 09/17/2014   Procedure: RIGHT SALPINGECTOMY;  Surgeon: Lenord Radon, MD;  Location: [facility];  Service: Gynecology;  Laterality: Right;   BREAST BIOPSY Left 03/2014   CHOLECYSTECTOMY Left 09/23/2023   Procedure: LAPAROSCOPIC CHOLECYSTECTOMY;  Surgeon: Aldean Hummingbird, MD;  Location: [facility] ORS;  Service: General;  Laterality: Left;   COLONOSCOPY  04/2014   DILATION AND CURETTAGE OF UTERUS     ROBOTIC ASSISTED TOTAL HYSTERECTOMY N/A  09/17/2014   Procedure: ROBOTIC ASSISTED TOTAL HYSTERECTOMY;  Surgeon: Lenord Radon, MD;  Location: [facility];  Service: Gynecology;  Laterality: N/A;   TOOTH EXTRACTION  09/30/2014   TUBAL LIGATION      MEDICATIONS: No current facility-administered medications for this encounter.    aspirin  EC 81 MG tablet   carvedilol  (COREG ) 3.125 MG tablet   docusate sodium  (COLACE) 50 MG capsule   Evolocumab  (REPATHA  SURECLICK) 140 MG/ML SOAJ   ezetimibe  (ZETIA ) 10 MG tablet   ibuprofen  (ADVIL ) 200 MG tablet   indapamide  (LOZOL ) 1.25 MG tablet   JARDIANCE  25 MG TABS tablet   lamoTRIgine  (LAMICTAL ) 25 MG tablet   metFORMIN  (GLUCOPHAGE -XR) 750 MG 24 hr tablet   rosuvastatin  (CRESTOR ) 40 MG tablet   thiamine  (VITAMIN B-1) 50 MG tablet   TRUEPLUS LANCETS 30G MISC   TRULICITY  1.5 MG/0.5ML SOAJ    Ella Gun, PA-C Surgical Short Stay/Anesthesiology [facility] Phone [phone] [facility] Phone [phone] 11/14/2023 9:39 AM

## 2023-11-14 NOTE — Progress Notes (Signed)
 SDW CALL  Patient was given pre-op instructions over the phone. The opportunity was given for the patient to ask questions. No further questions asked. Patient verbalized understanding of instructions given.   PCP - Arcadio Knuckles, MD  Cardiologist - Dr. Audery Blazing (yearly visit)  PPM/ICD - denies Device Orders - n/a Rep Notified - n/a  Chest x-ray - Chest CT-07-04-23 EKG - 09-23-23 Stress Test - 01-12-11 ECHO - 12-28-17 Cardiac Cath -   Sleep Study -  CPAP -  patient awaiting till after this procedure to obtain machine  Fasting Blood Sugar -  per patient sometimes checks are weekly but MD monitors A1c Last A1c 6.9 on 08-11-23  Last dose of GLP1 agonist-  TRULICITY   GLP1 instructions: last dose 11-04-23  Blood Thinner Instructions: Aspirin  Instructions: hold day prior to procedure per instructions from Dr. Baldwin Levee  ERAS Protcol - NPO   COVID TEST- n/a   Anesthesia review: Yes, HX OSA,DM,HTN.  Patient concerned about front tooth being loose, does not want to lose it.  Per patient it was a little loose prior to gallbladder removal recently and reports more unstable after procedure.  Patient denies shortness of breath, fever, cough and chest pain over the phone call   All instructions explained to the patient, with a verbal understanding of the material. Patient agrees to go over the instructions while at home for a better understanding.

## 2023-11-15 ENCOUNTER — Ambulatory Visit (HOSPITAL_BASED_OUTPATIENT_CLINIC_OR_DEPARTMENT_OTHER): Payer: Self-pay | Admitting: Vascular Surgery

## 2023-11-15 ENCOUNTER — Ambulatory Visit (HOSPITAL_COMMUNITY): Payer: Self-pay | Admitting: Vascular Surgery

## 2023-11-15 ENCOUNTER — Other Ambulatory Visit: Payer: Self-pay

## 2023-11-15 ENCOUNTER — Ambulatory Visit (HOSPITAL_COMMUNITY)
Admission: RE | Admit: 2023-11-15 | Discharge: 2023-11-15 | Disposition: A | Discharge status: Home / Self Care | Attending: Emergency Medicine | Admitting: Emergency Medicine

## 2023-11-15 ENCOUNTER — Encounter (HOSPITAL_COMMUNITY): Payer: Self-pay | Admitting: Emergency Medicine

## 2023-11-15 ENCOUNTER — Ambulatory Visit (HOSPITAL_COMMUNITY)

## 2023-11-15 ENCOUNTER — Encounter (HOSPITAL_COMMUNITY)
Admission: RE | Disposition: A | Discharge status: Home / Self Care | Payer: Self-pay | Source: Home / Self Care | Attending: Emergency Medicine

## 2023-11-15 DIAGNOSIS — E66813 Obesity, class 3: Secondary | ICD-10-CM | POA: Insufficient documentation

## 2023-11-15 DIAGNOSIS — E041 Nontoxic single thyroid nodule: Secondary | ICD-10-CM | POA: Insufficient documentation

## 2023-11-15 DIAGNOSIS — M1990 Unspecified osteoarthritis, unspecified site: Secondary | ICD-10-CM | POA: Diagnosis not present

## 2023-11-15 DIAGNOSIS — E1151 Type 2 diabetes mellitus with diabetic peripheral angiopathy without gangrene: Secondary | ICD-10-CM | POA: Diagnosis not present

## 2023-11-15 DIAGNOSIS — I739 Peripheral vascular disease, unspecified: Secondary | ICD-10-CM

## 2023-11-15 DIAGNOSIS — R0602 Shortness of breath: Secondary | ICD-10-CM | POA: Insufficient documentation

## 2023-11-15 DIAGNOSIS — R911 Solitary pulmonary nodule: Secondary | ICD-10-CM

## 2023-11-15 DIAGNOSIS — G4733 Obstructive sleep apnea (adult) (pediatric): Secondary | ICD-10-CM | POA: Diagnosis not present

## 2023-11-15 DIAGNOSIS — I700 Atherosclerosis of aorta: Secondary | ICD-10-CM | POA: Insufficient documentation

## 2023-11-15 DIAGNOSIS — Z87891 Personal history of nicotine dependence: Secondary | ICD-10-CM | POA: Diagnosis not present

## 2023-11-15 DIAGNOSIS — E118 Type 2 diabetes mellitus with unspecified complications: Secondary | ICD-10-CM

## 2023-11-15 DIAGNOSIS — I10 Essential (primary) hypertension: Secondary | ICD-10-CM | POA: Insufficient documentation

## 2023-11-15 DIAGNOSIS — Z7984 Long term (current) use of oral hypoglycemic drugs: Secondary | ICD-10-CM | POA: Diagnosis not present

## 2023-11-15 DIAGNOSIS — Z6841 Body Mass Index (BMI) 40.0 and over, adult: Secondary | ICD-10-CM | POA: Insufficient documentation

## 2023-11-15 DIAGNOSIS — J449 Chronic obstructive pulmonary disease, unspecified: Secondary | ICD-10-CM

## 2023-11-15 DIAGNOSIS — Z48813 Encounter for surgical aftercare following surgery on the respiratory system: Secondary | ICD-10-CM | POA: Diagnosis not present

## 2023-11-15 DIAGNOSIS — Z01818 Encounter for other preprocedural examination: Secondary | ICD-10-CM

## 2023-11-15 DIAGNOSIS — D649 Anemia, unspecified: Secondary | ICD-10-CM | POA: Diagnosis not present

## 2023-11-15 DIAGNOSIS — J432 Centrilobular emphysema: Secondary | ICD-10-CM | POA: Diagnosis not present

## 2023-11-15 DIAGNOSIS — I2510 Atherosclerotic heart disease of native coronary artery without angina pectoris: Secondary | ICD-10-CM | POA: Diagnosis not present

## 2023-11-15 HISTORY — PX: FIDUCIAL MARKER PLACEMENT: SHX6858

## 2023-11-15 HISTORY — PX: BRONCHIAL NEEDLE ASPIRATION BIOPSY: SHX5106

## 2023-11-15 HISTORY — PX: BRONCHIAL BRUSHINGS: SHX5108

## 2023-11-15 HISTORY — DX: Cardiomegaly: I51.7

## 2023-11-15 HISTORY — PX: BRONCHIAL BIOPSY: SHX5109

## 2023-11-15 HISTORY — PX: BRONCHOSCOPY, WITH BIOPSY USING ELECTROMAGNETIC NAVIGATION: SHX7536

## 2023-11-15 HISTORY — PX: BRONCHIAL WASHINGS: SHX5105

## 2023-11-15 LAB — BASIC METABOLIC PANEL WITH GFR
Anion gap: 12 (ref 5–15)
BUN: 11 mg/dL (ref 6–20)
CO2: 23 mmol/L (ref 22–32)
Calcium: 9.4 mg/dL (ref 8.9–10.3)
Chloride: 104 mmol/L (ref 98–111)
Creatinine, Ser: 0.65 mg/dL (ref 0.44–1.00)
GFR, Estimated: >60 mL/min (ref >60)
Glucose, Bld: 107 mg/dL — ABNORMAL HIGH (ref 70–99)
Potassium: 3.9 mmol/L (ref 3.5–5.1)
Sodium: 139 mmol/L (ref 135–145)

## 2023-11-15 LAB — GLUCOSE, CAPILLARY
Glucose-Capillary: 114 mg/dL — ABNORMAL HIGH (ref 70–99)
Glucose-Capillary: 107 mg/dL — ABNORMAL HIGH (ref 70–99)

## 2023-11-15 LAB — CBC
HCT: 47.4 % — ABNORMAL HIGH (ref 36.0–46.0)
Hemoglobin: 14.7 g/dL (ref 12.0–15.0)
MCH: 28.4 pg (ref 26.0–34.0)
MCHC: 31 g/dL (ref 30.0–36.0)
MCV: 91.5 fL (ref 80.0–100.0)
Platelets: 241 10*3/uL (ref 150–400)
RBC: 5.18 MIL/uL — ABNORMAL HIGH (ref 3.87–5.11)
RDW: 14.5 % (ref 11.5–15.5)
WBC: 4.6 10*3/uL (ref 4.0–10.5)
nRBC: 0 % (ref 0.0–0.2)

## 2023-11-15 SURGERY — BRONCHOSCOPY, WITH BIOPSY USING ELECTROMAGNETIC NAVIGATION
Anesthesia: General | Laterality: Right

## 2023-11-15 MED ORDER — DEXMEDETOMIDINE HCL IN NACL 80 MCG/20ML IV SOLN
INTRAVENOUS | Status: DC | PRN
Start: 2023-11-15 — End: 2023-11-15
  Administered 2023-11-15: 12 ug via INTRAVENOUS

## 2023-11-15 MED ORDER — LACTATED RINGERS IV SOLN: INTRAVENOUS | Status: DC

## 2023-11-15 MED ORDER — PROPOFOL 10 MG/ML IV BOLUS
INTRAVENOUS | Status: DC | PRN
  Administered 2023-11-15: 150 mg via INTRAVENOUS

## 2023-11-15 MED ORDER — INSULIN ASPART 100 UNIT/ML IJ SOLN: 0.0000 [IU] | INTRAMUSCULAR | Status: DC | PRN

## 2023-11-15 MED ORDER — PROPOFOL 500 MG/50ML IV EMUL
INTRAVENOUS | Status: DC | PRN
Start: 2023-11-15 — End: 2023-11-15
  Administered 2023-11-15: 100 ug/kg/min via INTRAVENOUS

## 2023-11-15 MED ORDER — DEXAMETHASONE SODIUM PHOSPHATE 10 MG/ML IJ SOLN
INTRAMUSCULAR | Status: DC | PRN
  Administered 2023-11-15: 10 mg via INTRAVENOUS

## 2023-11-15 MED ORDER — CHLORHEXIDINE GLUCONATE 0.12 % MT SOLN
15.0000 mL | Freq: Once | OROMUCOSAL | Status: AC
  Administered 2023-11-15: 15 mL via OROMUCOSAL
  Filled 2023-11-15: qty 15

## 2023-11-15 MED ORDER — ROCURONIUM BROMIDE 10 MG/ML (PF) SYRINGE
PREFILLED_SYRINGE | INTRAVENOUS | Status: DC | PRN
  Administered 2023-11-15: 50 mg via INTRAVENOUS

## 2023-11-15 MED ORDER — ACETAMINOPHEN 500 MG PO TABS: 1000.0000 mg | ORAL_TABLET | Freq: Once | ORAL | Status: DC

## 2023-11-15 MED ORDER — SUGAMMADEX SODIUM 200 MG/2ML IV SOLN
INTRAVENOUS | Status: DC | PRN
  Administered 2023-11-15: 200 mg via INTRAVENOUS

## 2023-11-15 MED ORDER — FENTANYL CITRATE (PF) 100 MCG/2ML IJ SOLN: 25.0000 ug | INTRAMUSCULAR | Status: DC | PRN

## 2023-11-15 MED ORDER — ONDANSETRON HCL 4 MG/2ML IJ SOLN
INTRAMUSCULAR | Status: DC | PRN
  Administered 2023-11-15: 4 mg via INTRAVENOUS

## 2023-11-15 MED ORDER — LIDOCAINE 2% (20 MG/ML) 5 ML SYRINGE
INTRAMUSCULAR | Status: DC | PRN
  Administered 2023-11-15: 60 mg via INTRAVENOUS

## 2023-11-15 SURGICAL SUPPLY — 1 items: superlock fiducial IMPLANT

## 2023-11-15 NOTE — Transfer of Care (Signed)
 Immediate Anesthesia Transfer of Care Note  Patient: Lorraine Holt  Procedure(s) Performed: BRONCHOSCOPY, WITH BIOPSY USING ELECTROMAGNETIC NAVIGATION (Right) BRONCHOSCOPY, WITH BIOPSY BRONCHOSCOPY, WITH NEEDLE ASPIRATION BIOPSY BRONCHOSCOPY, WITH BRUSH BIOPSY IRRIGATION, BRONCHUS INSERTION, FIDUCIAL MARKERS  Patient Location: PACU  Anesthesia Type:General  Level of Consciousness: awake, alert , and oriented  Airway & Oxygen Therapy: Patient Spontanous Breathing  Post-op Assessment: Report given to RN  Post vital signs: Reviewed and stable  Last Vitals:  Vitals Value Taken Time  BP 100/71 11/15/23 1202  Temp    Pulse 89 11/15/23 1204  Resp 15 11/15/23 1204  SpO2 91 % 11/15/23 1204  Vitals shown include unfiled device data.  Last Pain:  Vitals:   11/15/23 0849  TempSrc: Oral  PainSc:       Patients Stated Pain Goal: 0 (11/15/23 0847)  Complications: No notable events documented.

## 2023-11-15 NOTE — Op Note (Signed)
 Video Bronchoscopy with Robotic Assisted Bronchoscopic Navigation   Date of Operation: 11/15/2023   Pre-op Diagnosis: Right upper lobe nodule  Post-op Diagnosis: Same  Surgeon: Racheal Buddle  Assistants: None  Anesthesia: General endotracheal anesthesia  Operation: Flexible video fiberoptic bronchoscopy with robotic assistance and biopsies.  Estimated Blood Loss: Minimal  Complications: None  Indications and History: Lorraine Holt is a 57 y.o. female with history of tobacco use.  She has a mixed density right upper lobe pulmonary nodule that has changed slightly on serial CT scans of the chest.  Recommendation made to achieve a tissue diagnosis via robotic assisted navigational bronchoscopy.  The risks, benefits, complications, treatment options and expected outcomes were discussed with the patient.  The possibilities of pneumothorax, pneumonia, reaction to medication, pulmonary aspiration, perforation of a viscus, bleeding, failure to diagnose a condition and creating a complication requiring transfusion or operation were discussed with the patient who freely signed the consent.    Description of Procedure: The patient was seen in the Preoperative Area, was examined and was deemed appropriate to proceed.  The patient was taken to [facility] Endoscopy room 3, identified as Lorraine Holt and the procedure verified as Flexible Video Fiberoptic Bronchoscopy.  A Time Out was held and the above information confirmed.   Prior to the date of the procedure a high-resolution CT scan of the chest was performed. Utilizing ION software program a virtual tracheobronchial tree was generated to allow the creation of distinct navigation pathways to the patient's parenchymal abnormalities. After being taken to the operating room general anesthesia was initiated and the patient  was orally intubated. The video fiberoptic bronchoscope was introduced via the endotracheal tube and a general inspection was performed  which showed normal right and left lung anatomy. Aspiration of the bilateral mainstems was completed to remove any remaining secretions. Robotic catheter inserted into patient's endotracheal tube.   Target #1 right upper lobe nodule: The distinct navigation pathways prepared prior to this procedure were then utilized to navigate to patient's lesion identified on CT scan. The robotic catheter was secured into place and the vision probe was withdrawn.  Lesion location was approximated using fluoroscopy.  Local registration and targeting was performed using [employer] Cios mobile C-arm three-dimensional imaging. Under fluoroscopic guidance transbronchial needle brushings, transbronchial needle biopsies, and transbronchial forceps biopsies were performed to be sent for cytology and pathology.  Needle-in-lesion was confirmed using Cios mobile C-arm.  A bronchioalveolar lavage was performed in the right upper lobe and sent for cytology and microbiology.  Under fluoroscopic guidance a single fiducial marker was placed adjacent to the nodule.    At the end of the procedure a general airway inspection was performed and there was no evidence of active bleeding. The bronchoscope was removed.  The patient tolerated the procedure well. There was no significant blood loss and there were no obvious complications. A post-procedural chest x-ray is pending.  Samples Target #1: 1. Transbronchial needle brushings from right upper lobe nodule 2. Transbronchial Wang needle biopsies from right upper lobe nodule 3. Transbronchial forceps biopsies from right upper lobe nodule 4. Bronchoalveolar lavage from right upper lobe    Plans:  The patient will be discharged from the PACU to home when recovered from anesthesia and after chest x-ray is reviewed. We will review the cytology, pathology and microbiology results with the patient when they become available. Outpatient followup will be with Braden Caddy, NP and Dr  Baldwin Levee.    Racheal Buddle, MD, PhD 11/15/2023, 11:58  AM [facility] [phone] or if no answer before 7:00PM call [phone] For any issues after 7:00PM please call eLink [phone]

## 2023-11-15 NOTE — Anesthesia Postprocedure Evaluation (Signed)
 Anesthesia Post Note  Patient: Lorraine Holt  Procedure(s) Performed: BRONCHOSCOPY, WITH BIOPSY USING ELECTROMAGNETIC NAVIGATION (Right) BRONCHOSCOPY, WITH BIOPSY BRONCHOSCOPY, WITH NEEDLE ASPIRATION BIOPSY BRONCHOSCOPY, WITH BRUSH BIOPSY IRRIGATION, BRONCHUS INSERTION, FIDUCIAL MARKERS     Patient location during evaluation: PACU Anesthesia Type: General Level of consciousness: awake and alert Pain management: pain level controlled Vital Signs Assessment: post-procedure vital signs reviewed and stable Respiratory status: spontaneous breathing, nonlabored ventilation and respiratory function stable Cardiovascular status: blood pressure returned to baseline and stable Postop Assessment: no apparent nausea or vomiting Anesthetic complications: no  No notable events documented.  Last Vitals:  Vitals:   11/15/23 1245 11/15/23 1300  BP: 125/82 131/86  Pulse: 83 81  Resp: 16 18  Temp:  36.6 C  SpO2: 92% 93%    Last Pain:  Vitals:   11/15/23 1300  TempSrc:   PainSc: 0-No pain                 Arleth Mccullar,W. EDMOND

## 2023-11-15 NOTE — Interval H&P Note (Signed)
 History and Physical Interval Note:  11/15/2023 9:08 AM  Lorraine Holt  has presented today for surgery, with the diagnosis of Right upper lobe atypical pulmonary cyst.  The various methods of treatment have been discussed with the patient and family. After consideration of risks, benefits and other options for treatment, the patient has consented to  ROBOTIC ASSISTED NAVIGATIONAL BRONCHOSCOPY as a surgical intervention.  The patient's history has been reviewed, patient examined, no change in status, stable for surgery.  I have reviewed the patient's chart and labs.  Questions were answered to the patient's satisfaction.     Denson Flake

## 2023-11-15 NOTE — Discharge Instructions (Addendum)
 Flexible Bronchoscopy, Care After This sheet gives you information about how to care for yourself after your test. Your doctor may also give you more specific instructions. If you have problems or questions, contact your doctor. Follow these instructions at home: Eating and drinking When you are wide awake, your numbness is gone and your cough and gag reflexes have come back, you may: Start eating only soft foods. Slowly drink liquids. Six hours after the test, go back to your normal diet. Driving Do not drive for 24 hours if you were given a medicine to help you relax (sedative). Do not drive or use heavy machinery while taking prescription pain medicine. General instructions Take over-the-counter and prescription medicines only as told by your doctor. Return to your normal activities as told. Ask what activities are safe for you. Do not use any products that have nicotine or tobacco in them. This includes cigarettes and e-cigarettes. If you need help quitting, ask your doctor. Keep all follow-up visits as told by your doctor. This is important. It is very important if you had a tissue sample (biopsy) taken. Get help right away if: You have shortness of breath that gets worse. You get light-headed. You feel like you are going to pass out (faint). You have chest pain. You cough up: More than a little blood. More blood than before. Summary Do not use cigarettes. Do not use e-cigarettes. Seek care in the Emergency Department right away if you have chest pain or shortness of breath. Call or MyChart Message our office for any questions or problems at [phone].  Okay to restart aspirin  on 11/16/2023 Okay to restart your Jardiance  and Trulicity  as per your usual schedule   This information is not intended to replace advice given to you by your health care provider. Make sure you discuss any questions you have with your health care provider.

## 2023-11-15 NOTE — Anesthesia Procedure Notes (Signed)
 Procedure Name: Intubation Date/Time: 11/15/2023 11:04 AM  Performed by: Hershall Lory, CRNAPre-anesthesia Checklist: Patient identified, Emergency Drugs available, Suction available and Patient being monitored Patient Re-evaluated:Patient Re-evaluated prior to induction Oxygen Delivery Method: Circle System Utilized Preoxygenation: Pre-oxygenation with 100% oxygen Induction Type: IV induction Ventilation: Mask ventilation without difficulty Laryngoscope Size: Glidescope and 4 Grade View: Grade I Tube type: Oral Tube size: 8.0 mm Number of attempts: 1 Airway Equipment and Method: Stylet, Oral airway and Video-laryngoscopy Placement Confirmation: ETT inserted through vocal cords under direct vision, positive ETCO2 and breath sounds checked- equal and bilateral Secured at: 24 cm Tube secured with: Tape Dental Injury: Teeth and Oropharynx as per pre-operative assessment

## 2023-11-15 NOTE — Telephone Encounter (Signed)
 Last seen on 10/13/23 Follow up scheduled on 10/18/24   Did you want patient to continue medication?

## 2023-11-16 LAB — AEROBIC/ANAEROBIC CULTURE W GRAM STAIN (SURGICAL/DEEP WOUND)

## 2023-11-16 LAB — CYTOLOGY - NON PAP

## 2023-11-17 LAB — ACID FAST SMEAR (AFB, MYCOBACTERIA): Acid Fast Smear: NEGATIVE

## 2023-11-17 LAB — CULTURE, BAL-QUANTITATIVE W GRAM STAIN

## 2023-11-18 ENCOUNTER — Encounter (HOSPITAL_COMMUNITY): Payer: Self-pay | Admitting: Emergency Medicine

## 2023-11-22 ENCOUNTER — Ambulatory Visit (INDEPENDENT_AMBULATORY_CARE_PROVIDER_SITE_OTHER): Admitting: Acute Care

## 2023-11-22 ENCOUNTER — Other Ambulatory Visit: Payer: Self-pay

## 2023-11-22 ENCOUNTER — Encounter: Payer: Self-pay | Admitting: Acute Care

## 2023-11-22 VITALS — BP 115/76 | HR 92 | Ht 70.0 in | Wt 308.4 lb

## 2023-11-22 DIAGNOSIS — J069 Acute upper respiratory infection, unspecified: Secondary | ICD-10-CM

## 2023-11-22 DIAGNOSIS — Z9889 Other specified postprocedural states: Secondary | ICD-10-CM | POA: Diagnosis not present

## 2023-11-22 DIAGNOSIS — R911 Solitary pulmonary nodule: Secondary | ICD-10-CM

## 2023-11-22 DIAGNOSIS — Z87891 Personal history of nicotine dependence: Secondary | ICD-10-CM

## 2023-11-22 DIAGNOSIS — Z122 Encounter for screening for malignant neoplasm of respiratory organs: Secondary | ICD-10-CM

## 2023-11-22 MED ORDER — DOXYCYCLINE HYCLATE 100 MG PO TABS: 100.0000 mg | ORAL_TABLET | Freq: Two times a day (BID) | ORAL | 0 refills | Status: DC

## 2023-11-22 NOTE — Patient Instructions (Signed)
 It is good to see you today. I am glad you have done well after your bronchoscopy and biopsies. We have reviewed your cytology which was negative for malignancy which is good news. We have reviewed your cultures which were positive for a few bacteria. Because of your allergy to penicillin we will treat you with doxycycline Take 1 tablet in the morning and 1 tablet in the evening daily for 7 days. Remember this can make you sun sensitive so wear sunblock if you are out in the sun. We will do a follow-up low-dose CT scan in 3 months to reevaluate the nodule and maintain surveillance. You will get a call to get this scheduled closer to the time it is due in September 2025. Please give us  a call if you need us  sooner. Please contact office for sooner follow up if symptoms do not improve or worsen or seek emergency care

## 2023-11-22 NOTE — Progress Notes (Addendum)
 History of Present Illness Lorraine Holt is a 58 y.o. female some day smoker  ( 34 pack year smoking history) followed through the lung cancer screening program for an abnormal screening scan 07/04/2023. She will be followed by Dr. Baldwin Levee.  Synopsis Pt. Followed for  surveillance of a pulmonary nodule. CT Chest done 10/2023  showed  a very slowly growing apical right upper lobe part solid 1.8 cm pulmonary nodule with a 0.4 centimeter solid component.  This is increased from 1.4 cm diam by 0.2 cm in the last 6 months.  PET scan was ordered to better evaluate this finding.  It showed that the subsolid right upper lobe nodule has a maximum SUV of 1.6.  Per radiology, this low-level activity could be associated with a benign inflammatory/postinflammatory process or low-grade adenocarcinoma. Particular attention to morphologic changes and size changes in the lesion recommended in determining whether surveillance or tissue sampling/resection is most appropriate.   Lorraine Holt  and I discussed this finding.  She stated that she quit smoking in January 2025 .  We discussed watchful waiting with a 63-month follow-up scan as looking back she does feel that there is potential she had an upper respiratory infection around the time of the scan. The follow up scan 10/2023 showed the right upper pulmonary nodule while stable, morphologically is concerning for an indolent adenocarcinoma. We had discussed moving forward with biopsy if this nodule continued to look concerning. She  agreed to  moving forward with biopsy. She underwent navigational bronchoscopy with biopsies 11/15/2023. She is here today to review her biopsy results, and to ensure she has done well post procedure.   Pt. Has consented to use of [employer] soft wear to help capture the content of this OV     11/22/2023 Pt. Presents for  follow up after navigational bronchoscopy with biopsies on 11/15/2023. She states she did well after the procedure.  She has a  sore and scratchy throat following the procedure. No bleeding, fever, worsening shortness of breath, or anesthesia complications. She confirms she is back to her baseline health status.  We discussed her recent biopsy results , which were negative for malignant cells. Cultures showed normal respiratory flora with no pseudomonas. However, there were a few bacteria present in her aerobic/ anaerobic culture. She is allergic to penicillin, so we will treat with doxycyline as a precaution. She is totally asymptomatic of any upper respiratory illness.   We discussed that we will do a 3 month follow up Ct Chest to follow the lung nodule for stability, with the goal of getting her back into the  lung cancer screening population. She is in agreement with this plan.      Test Results: Cytology 11/15/2023 FINAL MICROSCOPIC DIAGNOSIS:  A. LUNG, RUL, FINE NEEDLE ASPIRATION:  - No malignant cells identified  - Benign bronchial cells and pulmonary macrophages   B. LUNG, RUL, BRUSHING:  - Nondiagnostic material  - Predominantly blood   Right upper lobe atypical pulmonary cyst  Specimen Submitted:   C. LUNG, RUL, LAVAGE:   FINAL MICROSCOPIC DIAGNOSIS:  - No malignant cells identified   Micro 11/15/2023 No Fungus observed AFB Negative  Aerobic and Anaerobic  FEW PREVOTELLA MELANINOGENICA  BETA LACTAMASE POSITIVE   BAL 40,000 colonies of normal respiratory flora, no staph aureus  or pseudomonas seen.   08/22/2023 Pet Scan  The subsolid right upper lobe nodule has a maximum SUV of 1.6. This low-level activity could be associated with a benign inflammatory/postinflammatory  process or low-grade adenocarcinoma. Particular attention to morphologic changes and size changes in the lesion recommended in determining whether surveillance or tissue sampling/resection is most appropriate. 2. Upper normal sized 0.9 cm in short axis right paratracheal lymph node, no accentuated metabolic activity. Likely  benign. 3. Suspected gallstone measuring up to 2.5 cm in the neck of the gallbladder. 4. Lower lumbar spondylosis. 5. Aortic and coronary atherosclerosis.  LDCT Chest 10/18/2023 Apical segment right upper lobe atypical pulmonary cyst with eccentric ground-glass wall thickening and nodularity, stable from 07/04/2023. Findings remain worrisome for indolent adenocarcinoma. Lung-RADS 4A, suspicious.       Latest Ref Rng & Units 11/15/2023    8:56 AM 09/23/2023   11:26 AM 08/11/2023    1:39 PM  CBC  WBC 4.0 - 10.5 K/uL 4.6  5.7  5.6   Hemoglobin 12.0 - 15.0 g/dL 16.1  09.6  04.5   Hematocrit 36.0 - 46.0 % 47.4  47.5  44.5   Platelets 150 - 400 K/uL 241  212  205.0        Latest Ref Rng & Units 11/15/2023    8:56 AM 09/23/2023   11:26 AM 08/11/2023    1:39 PM  BMP  Glucose 70 - 99 mg/dL 409  811  914   BUN 6 - 20 mg/dL 11  13  11    Creatinine 0.44 - 1.00 mg/dL 7.82  9.56  2.13   Sodium 135 - 145 mmol/L 139  137  139   Potassium 3.5 - 5.1 mmol/L 3.9  3.5  3.9   Chloride 98 - 111 mmol/L 104  105  102   CO2 22 - 32 mmol/L 23  24  31    Calcium  8.9 - 10.3 mg/dL 9.4  9.3  9.2     BNP No results found for: BNP  ProBNP    Component Value Date/Time   PROBNP 133 04/14/2018 1038   PROBNP 237.0 (H) 12/19/2017 1646    PFT No results found for: FEV1PRE, FEV1POST, FVCPRE, FVCPOST, TLC, DLCOUNC, PREFEV1FVCRT, PSTFEV1FVCRT  DG Chest Port 1 View Result Date: 11/15/2023 CLINICAL DATA:  Status post bronchoscopy with biopsy. EXAM: PORTABLE CHEST 1 VIEW COMPARISON:  December 19, 2017. FINDINGS: Stable cardiomediastinal silhouette. No pneumothorax is noted. No significant consolidative process is noted. Fiducial marker is noted in right upper lung. Bony thorax is unremarkable. IMPRESSION: No pneumothorax status post bronchoscopy with biopsy. Electronically Signed   By: Rosalene Colon M.D.   On: 11/15/2023 12:39   DG C-ARM BRONCHOSCOPY Result Date: 11/15/2023 C-ARM BRONCHOSCOPY:  Fluoroscopy was utilized by the requesting physician.  No radiographic interpretation.     Past medical hx Past Medical History:  Diagnosis Date   Anemia    history age 76'S   Arthritis    [employer] 02/24/2020   Bronchitis    Carpal tunnel syndrome of right wrist    Diabetes mellitus without complication (HCC)    [employer] 02/24/2020   Enlarged heart    per patient follow with cardiology   Hyperlipidemia    Hypertension    Obesity    Peripheral vascular disease (HCC)    undiagnosis, but complains of numbness in legs   Shortness of breath dyspnea    with exertion   Sleep apnea    [employer] 02/24/2020   Tooth abscess 09/30/2014   tooth was extracted     Social History   Tobacco Use   Smoking status: Former    Current packs/day: 0.00    Types: Cigarettes  Quit date: 07/08/2023    Years since quitting: 0.3    Passive exposure: Past   Smokeless tobacco: Never   Tobacco comments:    Pt quit smoking the end of January 2025-08/23/2023  Substance Use Topics   Alcohol  use: No   Drug use: No    LorraineCrumrine reports that she quit smoking about 4 months ago. Her smoking use included cigarettes. She has been exposed to tobacco smoke. She has never used smokeless tobacco. She reports that she does not drink alcohol  and does not use drugs.  Tobacco Cessation: Counseling given: Not Answered Tobacco comments: Pt quit smoking the end of January 2025-08/23/2023   Past surgical hx, Family hx, Social hx all reviewed.  Current Outpatient Medications on File Prior to Visit  Medication Sig   aspirin  EC 81 MG tablet Take 81 mg by mouth daily. Swallow whole.   carvedilol  (COREG ) 3.125 MG tablet Take 1 tablet (3.125 mg total) by mouth 2 (two) times daily with a meal.   Evolocumab  (REPATHA  SURECLICK) 140 MG/ML SOAJ INJECT 140 MG INTO THE SKIN EVERY 14 DAYS   ezetimibe  (ZETIA ) 10 MG tablet Take 1 tablet (10 mg total) by mouth daily.   ibuprofen  (ADVIL ) 200 MG tablet Take 400 mg by mouth 3  (three) times daily.   indapamide  (LOZOL ) 1.25 MG tablet Take 1 tablet (1.25 mg total) by mouth daily.   JARDIANCE  25 MG TABS tablet Take 1 tablet by mouth once daily   lamoTRIgine  (LAMICTAL ) 25 MG tablet Take 2 tablets by mouth twice daily   metFORMIN  (GLUCOPHAGE -XR) 750 MG 24 hr tablet Take 1 tablet (750 mg total) by mouth daily with breakfast.   rosuvastatin  (CRESTOR ) 40 MG tablet Take 1 tablet (40 mg total) by mouth daily.   thiamine  (VITAMIN B-1) 50 MG tablet Take 1 tablet (50 mg total) by mouth daily.   TRUEPLUS LANCETS 30G MISC USE TWICE DAILY   TRULICITY  1.5 MG/0.5ML SOAJ INJECT 1.5 MG SUBCUTANEOUSLY ONCE A WEEK   Wheat Dextrin (BENEFIBER PO) Take by mouth.   docusate sodium  (COLACE) 50 MG capsule Take 50 mg by mouth daily. (Patient not taking: Reported on 11/22/2023)   No current facility-administered medications on file prior to visit.     Allergies  Allergen Reactions   Clindamycin /Lincomycin Swelling    Tongue swelled   Penicillins Swelling and Rash   Amlodipine      Constipation, dizziness    Review Of Systems:  Constitutional:   No  weight loss, night sweats,  Fevers, chills, fatigue, or  lassitude.  HEENT:   No headaches,  Difficulty swallowing,  Tooth/dental problems, or  Sore throat,                No sneezing, itching, ear ache, nasal congestion, post nasal drip,   CV:  No chest pain,  Orthopnea, PND, swelling in lower extremities, anasarca, dizziness, palpitations, syncope.   GI  No heartburn, indigestion, abdominal pain, nausea, vomiting, diarrhea, change in bowel habits, loss of appetite, bloody stools.   Resp: No shortness of breath with exertion or at rest.  No excess mucus, no productive cough,  No non-productive cough,  No coughing up of blood.  No change in color of mucus.  No wheezing.  No chest wall deformity  Skin: no rash or lesions.  GU: no dysuria, change in color of urine, no urgency or frequency.  No flank pain, no hematuria   MS:  No joint  pain or swelling.  No decreased range of motion.  No back pain.  Psych:  No change in mood or affect. No depression or anxiety.  No memory loss.   Vital Signs BP 115/76 (BP Location: Left Arm, Patient Position: Sitting, Cuff Size: Large)   Pulse 92   Ht 5' 10 (1.778 m)   Wt (!) 308 lb 6.4 oz (139.9 kg)   LMP 08/08/2014   SpO2 96%   BMI 44.25 kg/m    Physical Exam:  General- No distress,  A&Ox3, pleasant ENT: No sinus tenderness, TM clear, pale nasal mucosa, no oral exudate,no post nasal drip, no LAN Cardiac: S1, S2, regular rate and rhythm, no murmur Chest: No wheeze/ rales/ dullness; no accessory muscle use, no nasal flaring, no sternal retractions Abd.: Soft Non-tender Ext: No clubbing cyanosis, edema Neuro:  normal strength, MAE x 4, A&O x 3 Skin: No rashes, warm and dry Psych: normal mood and behavior   Assessment/Plan  Post bronchoscopy with biopsies Recently quit smoker with a 32 pack year smoking history Biopsy negative for malignancy.  Cultures showed normal flora with few beta-lactamase positive bacteria.  Plan - Prescribe doxycycline, one tablet twice daily for one week. - Avoid dairy, antacids, and certain foods within one to two hours of doxycycline. - Advise on photosensitivity; recommend sunblock use.  Lung cancer screening follow-up Previous area of concern increased in size, requiring close monitoring. - Schedule follow-up CT scan in three months. - Will be reviewed by screening navigational nurses, and results will be called.   I spent 25 minutes dedicated to the care of this patient on the date of this encounter to include pre-visit review of records, face-to-face time with the patient discussing conditions above, post visit ordering of testing, clinical documentation with the electronic health record, making appropriate referrals as documented, and communicating necessary information to the patient's healthcare team.    Raejean Bullock, NP 11/22/2023   10:15 AM

## 2023-11-28 ENCOUNTER — Encounter: Payer: Self-pay | Admitting: Neurology

## 2023-11-29 ENCOUNTER — Telehealth: Payer: Self-pay | Admitting: Neurology

## 2023-11-29 ENCOUNTER — Ambulatory Visit: Admitting: Neurology

## 2023-11-29 ENCOUNTER — Encounter: Payer: Self-pay | Admitting: Neurology

## 2023-11-29 ENCOUNTER — Encounter: Payer: Self-pay | Admitting: Internal Medicine

## 2023-11-29 VITALS — BP 123/82 | HR 76 | Ht 70.0 in | Wt 309.0 lb

## 2023-11-29 DIAGNOSIS — M7551 Bursitis of right shoulder: Secondary | ICD-10-CM

## 2023-11-29 DIAGNOSIS — G4733 Obstructive sleep apnea (adult) (pediatric): Secondary | ICD-10-CM | POA: Diagnosis not present

## 2023-11-29 DIAGNOSIS — M7552 Bursitis of left shoulder: Secondary | ICD-10-CM

## 2023-11-29 DIAGNOSIS — G4734 Idiopathic sleep related nonobstructive alveolar hypoventilation: Secondary | ICD-10-CM

## 2023-11-29 DIAGNOSIS — R21 Rash and other nonspecific skin eruption: Secondary | ICD-10-CM | POA: Diagnosis not present

## 2023-11-29 MED ORDER — METHYLPREDNISOLONE ACETATE 80 MG/ML IJ SUSP
80.0000 mg | Freq: Once | INTRAMUSCULAR | Status: AC
  Administered 2023-11-29: 80 mg via INTRAMUSCULAR

## 2023-11-29 NOTE — Telephone Encounter (Signed)
 Noted

## 2023-11-29 NOTE — Progress Notes (Signed)
 [facility]  PATIENT: Lorraine Holt DOB: 02/02/1966  REFERRING DOCTOR OR PCP: Debby Molt, MD SOURCE: Patient, notes from primary care, imaging and lab reports,  _________________________________   HISTORICAL  CHIEF COMPLAINT:  Chief Complaint  Patient presents with   RM11/Nocturnal Hypoxemia    Pt is here Alone. Pt states that she toss and turn at night. Pt states that she has some fatigue throughout the day. Pt would like to discuss medication and injection in her shoulders.     HISTORY OF PRESENT ILLNESS:  Lorraine Holt is a 58 y.o. woman with numbness, shoulder pain and sleep apnea.  UPDATE 6/24/01/2024 She started lamotrigine  a year ago for dysesthesias and has benefit.  She is only taking 50 mg po qAM and 25 mg(sometimes 50 m) at night.  Her hand dysesthesias are reduced on this dose.  .   She initially did well but noted a breakout in the vaginal region about 4-5 months after starting the lamotrigine .  Also of note she has had some other changes in medications - Repatha  and Jardience.      She had  UTI ruled out.   Fungal ruled out.   She is diabetic.   She has changed detergents and underwear style with no benefit.  She had an evaluation for abnormal lung scans initially concerning for cancer but she states it was diagnosed as an infection and she is better.     She has moderate OSA (AHI = 25.9 on HST 07/20/2022 and SaO2 nadir was 78%.  She  tried CPAP in 2018 (mild OSA with hypoxemia) but had trouble tolerating it so she stopped.   She is going to restart and has insurance worked out.     She is trying to lose weight - she lost 15 pounds but gained some back when she quit smoking.   Her shoulder is doing much better.   We had done subacromial bursa injections with benefit first on the right then left at last visit.   She has moderate right CTS on NCV but feels better than last visit.  The hand brace and lamotrigine  have helped   EPWORTH SLEEPINESS  SCALE  On a scale of 0 - 3 what is the chance of dozing:  Sitting and Reading:   0 Watching TV:    3 Sitting inactive in a public place: 1 Passenger in car for one hour: 0 Lying down to rest in the afternoon: 3 Sitting and talking to someone: 0 Sitting quietly after lunch:  3 In a car, stopped in traffic:  0  Total (out of 24):     11/24  mild EDS   She has NIDDM. 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  REVIEW OF SYSTEMS: Constitutional: No fevers, chills, sweats, or change in appetite Eyes: No visual changes, double vision, eye pain Ear, nose and throat: No hearing loss, ear pain, nasal congestion, sore throat Cardiovascular: No chest pain, palpitations Respiratory:  No shortness of breath at rest or with exertion.   No wheezes GastrointestinaI: No nausea, vomiting, diarrhea, abdominal pain, fecal incontinence Genitourinary:  No dysuria, urinary retention or frequency.  No nocturia. Musculoskeletal:  No neck pain, back pain Integumentary: No rash, pruritus, skin lesions Neurological: as above Psychiatric: No depression at this time.  No anxiety Endocrine: No palpitations, diaphoresis, change in appetite, change in weigh or increased thirst Hematologic/Lymphatic:  No anemia, purpura, petechiae. Allergic/Immunologic: No itchy/runny eyes, nasal congestion, recent allergic reactions, rashes  ALLERGIES: Allergies  Allergen Reactions   Clindamycin /Lincomycin Swelling    Tongue swelled   Penicillins Swelling and Rash   Amlodipine      Constipation, dizziness    HOME MEDICATIONS:  Current Outpatient Medications:    aspirin  EC 81 MG tablet, Take 81 mg by mouth daily. Swallow whole., Disp: , Rfl:    carvedilol  (COREG ) 3.125 MG tablet, Take 1 tablet (3.125 mg total) by mouth 2 (two) times daily with a meal., Disp: 180 tablet, Rfl: 0   doxycycline (VIBRA-TABS) 100 MG tablet, Take 1 tablet (100 mg total) by mouth 2 (two) times daily., Disp: 14 tablet, Rfl: 0    Evolocumab  (REPATHA  SURECLICK) 140 MG/ML SOAJ, INJECT 140 MG INTO THE SKIN EVERY 14 DAYS, Disp: 2 mL, Rfl: 11   ezetimibe  (ZETIA ) 10 MG tablet, Take 1 tablet (10 mg total) by mouth daily., Disp: 90 tablet, Rfl: 3   ibuprofen  (ADVIL ) 200 MG tablet, Take 400 mg by mouth 3 (three) times daily., Disp: , Rfl:    indapamide  (LOZOL ) 1.25 MG tablet, Take 1 tablet (1.25 mg total) by mouth daily., Disp: 90 tablet, Rfl: 0   JARDIANCE  25 MG TABS tablet, Take 1 tablet by mouth once daily, Disp: 90 tablet, Rfl: 0   lamoTRIgine  (LAMICTAL ) 25 MG tablet, Take 2 tablets by mouth twice daily, Disp: 120 tablet, Rfl: 11   metFORMIN  (GLUCOPHAGE -XR) 750 MG 24 hr tablet, Take 1 tablet (750 mg total) by mouth daily with breakfast., Disp: 90 tablet, Rfl: 0   rosuvastatin  (CRESTOR ) 40 MG tablet, Take 1 tablet (40 mg total) by mouth daily., Disp: 90 tablet, Rfl: 1   thiamine  (VITAMIN B-1) 50 MG tablet, Take 1 tablet (50 mg total) by mouth daily., Disp: 90 tablet, Rfl: 1   TRUEPLUS LANCETS 30G MISC, USE TWICE DAILY, Disp: 100 each, Rfl: 11   TRULICITY  1.5 MG/0.5ML SOAJ, INJECT 1.5 MG SUBCUTANEOUSLY ONCE A WEEK, Disp: 4 mL, Rfl: 0   Wheat Dextrin (BENEFIBER PO), Take by mouth., Disp: , Rfl:    docusate sodium  (COLACE) 50 MG capsule, Take 50 mg by mouth daily. (Patient not taking: Reported on 11/29/2023), Disp: , Rfl:   PAST MEDICAL HISTORY: Past Medical History:  Diagnosis Date   Anemia    history age 55'S   Arthritis    [employer]reesia 02/24/2020   Bronchitis    Carpal tunnel syndrome of right wrist    Diabetes mellitus without complication (HCC)    [employer]reesia 02/24/2020   Enlarged heart    per patient follow with cardiology   Hyperlipidemia    Hypertension    Obesity    Peripheral vascular disease (HCC)    undiagnosis, but complains of numbness in legs   Shortness of breath dyspnea    with exertion   Sleep apnea    Phreesia 02/24/2020   Tooth abscess 09/30/2014   tooth was extracted    PAST SURGICAL  HISTORY: Past Surgical History:  Procedure Laterality Date   ABDOMINAL HYSTERECTOMY  09/2015   BILATERAL SALPINGECTOMY Right 09/17/2014   Procedure: RIGHT SALPINGECTOMY;  Surgeon: Elveria Mungo, MD;  Location: [facility];  Service: Gynecology;  Laterality: Right;   BREAST BIOPSY Left 03/2014   BRONCHIAL BIOPSY  11/15/2023   Procedure: BRONCHOSCOPY, WITH BIOPSY;  Surgeon: Shelah Lamar RAMAN, MD;  Location: [facility] ENDOSCOPY;  Service: Pulmonary;;   BRONCHIAL BRUSHINGS  11/15/2023   Procedure: BRONCHOSCOPY, WITH BRUSH BIOPSY;  Surgeon: Shelah Lamar RAMAN, MD;  Location: [facility] ENDOSCOPY;  Service: Pulmonary;;   BRONCHIAL NEEDLE ASPIRATION BIOPSY  11/15/2023   Procedure: BRONCHOSCOPY, WITH NEEDLE ASPIRATION BIOPSY;  Surgeon: Shelah Lamar RAMAN, MD;  Location[facility] ENDOSCOPY;  Service: Pulmonary;;   BRONCHIAL WASHINGS  11/15/2023   Procedure: IRRIGATION, BRONCHUS;  Surgeon: Shelah Lamar RAMAN, MD;  Location: [facility] ENDOSCOPY;  Service: Pulmonary;;   BRONCHOSCOPY, WITH BIOPSY USING ELECTROMAGNETIC NAVIGATION Right 11/15/2023   Procedure: BRONCHOSCOPY, WITH BIOPSY USING ELECTROMAGNETIC NAVIGATION;  Surgeon: Shelah Lamar RAMAN, MD;  Location: [facility] ENDOSCOPY;  Service: Pulmonary;  Laterality: Right;   CHOLECYSTECTOMY Left 09/23/2023   Procedure: LAPAROSCOPIC CHOLECYSTECTOMY;  Surgeon: Tanda Locus, MD;  Location: [facility];  Service: General;  Laterality: Left;   COLONOSCOPY  04/2014   DILATION AND CURETTAGE OF UTERUS     FIDUCIAL MARKER PLACEMENT  11/15/2023   Procedure: INSERTION, FIDUCIAL MARKERS;  Surgeon: Shelah Lamar RAMAN, MD;  Location: [facility] ENDOSCOPY;  Service: Pulmonary;;   GALLBLADDER SURGERY  10/20/2023   ROBOTIC ASSISTED TOTAL HYSTERECTOMY N/A 09/17/2014   Procedure: ROBOTIC ASSISTED TOTAL HYSTERECTOMY;  Surgeon: Elveria Mungo, MD;  Location: [facility];  Service: Gynecology;  Laterality: N/A;   TOOTH EXTRACTION  09/30/2014   TUBAL LIGATION      FAMILY HISTORY: Family History  Problem Relation Age of Onset    Hypertension Mother    Diabetes Mother    Breast cancer Mother    Diabetes Maternal Aunt    Breast cancer Maternal Aunt    Breast cancer Maternal Aunt    Diabetes Maternal Uncle    COPD Neg Hx    Early death Neg Hx    Heart disease Neg Hx    Hyperlipidemia Neg Hx    Kidney disease Neg Hx    Stroke Neg Hx     SOCIAL HISTORY: Social History   Socioeconomic History   Marital status: Married    Spouse name: Not on file   Number of children: 4   Years of education: Not on file   Highest education level: Some college, no degree  Occupational History   Occupation: [employer]  Tobacco Use   Smoking status: Former    Current packs/day: 1.00    Types: Cigarettes    Passive exposure: Past   Smokeless tobacco: Never   Tobacco comments:    Pt quit smoking the end of January 2025-08/23/2023    Pt. With a 32 pack year smoking history  Vaping Use   Vaping status: Not on file  Substance and Sexual Activity   Alcohol  use: No   Drug use: No   Sexual activity: Never    Birth control/protection: Surgical  Other Topics Concern   Not on file  Social History Narrative   Pt lives husband    Pt works    Social Drivers of Corporate investment banker Strain: Low Risk  (08/07/2023)   Overall Financial Resource Strain (CARDIA)    Difficulty of Paying Living Expenses: Not hard at all  Food Insecurity: No Food Insecurity (08/07/2023)   Hunger Vital Sign    Worried About Running Out of Food in the Last Year: Never true    Ran Out of Food in the Last Year: Never true  Transportation Needs: No Transportation Needs (08/07/2023)   PRAPARE - Administrator, Civil Service (Medical): No    Lack of Transportation (Non-Medical): No  Physical Activity: Insufficiently Active (08/07/2023)   Exercise Vital Sign    Days of Exercise per Week: 2 days    Minutes of Exercise per Session: 10 min  Stress: No Stress Concern Present (  08/07/2023)   [employer] [employer] -  Occupational Stress Questionnaire    Feeling of Stress : Only a little  Social Connections: Socially Integrated (08/07/2023)   Social Connection and Isolation Panel    Frequency of Communication with Friends and Family: More than three times a week    Frequency of Social Gatherings with Friends and Family: Once a week    Attends Religious Services: More than 4 times per year    Active Member of [employer] or Organizations: Yes    Attends Engineer, structural: More than 4 times per year    Marital Status: Married  Catering manager Violence: Not on file       PHYSICAL EXAM  Vitals:   11/29/23 1424  BP: 123/82  Pulse: 76  SpO2: 96%  Weight: (!) 309 lb (140.2 kg)  Height: 5' 10 (1.778 m)    Body mass index is 44.34 kg/m.   General: The patient is an obese woman in no acute distress  HEENT:  Head is [place]/AT.  Sclera are anicteric.    Neck: No carotid bruits are noted.  The neck is nontender.  Skin: She has a callus adjacent to the second toe on the right.  Musculoskeletal: She has reduced ROM in shoulders   Pain over both subacromial bursae  Neurologic Exam  Mental status: The patient is alert and oriented x 3 at the time of the examination. The patient has apparent normal recent and remote memory, with an apparently normal attention span and concentration ability.   Speech is normal.  Cranial nerves: Extraocular movements are full. PFacial stregth and sesation were normal.    . No obvious hearing deficits are noted.  Motor:  Muscle bulk is normal.   Tone is normal. Strength is  5 / 5 in all 4 extremities except 4+/5 right APB.   Sensory: Today, sensation was normal in the hands  Other:  Tinels sign at the right wrist.   Coordination: Cerebellar testing reveals good finger-nose-finger and heel-to-shin bilaterally.  Gait and station: Station is normal.   Gait is normal. Tandem gait is mildly wide. Romberg is negative.   Reflexes: Deep tendon reflexes are symmetric  and normal bilaterally.      DIAGNOSTIC DATA (LABS, IMAGING, TESTING) - I reviewed patient records, labs, notes, testing and imaging myself where available.  Lab Results  Component Value Date   WBC 4.6 11/15/2023   HGB 14.7 11/15/2023   HCT 47.4 (H) 11/15/2023   MCV 91.5 11/15/2023   PLT 241 11/15/2023      Component Value Date/Time   NA 139 11/15/2023 0856   NA 141 02/29/2020 1115   K 3.9 11/15/2023 0856   CL 104 11/15/2023 0856   CO2 23 11/15/2023 0856   GLUCOSE 107 (H) 11/15/2023 0856   BUN 11 11/15/2023 0856   BUN 14 02/29/2020 1115   CREATININE 0.65 11/15/2023 0856   CALCIUM  9.4 11/15/2023 0856   PROT 7.6 09/23/2023 1126   PROT 7.2 02/29/2020 1115   ALBUMIN 3.9 09/23/2023 1126   ALBUMIN 4.1 02/29/2020 1115   AST 25 09/23/2023 1126   ALT 44 09/23/2023 1126   ALKPHOS 140 (H) 09/23/2023 1126   BILITOT 1.0 09/23/2023 1126   BILITOT 0.2 02/29/2020 1115   GFRNONAA >60 11/15/2023 0856   GFRAA 97 02/29/2020 1115   Lab Results  Component Value Date   CHOL 100 09/19/2023   HDL 55 09/19/2023   LDLCALC 31 09/19/2023   TRIG 61 09/19/2023  CHOLHDL 1.8 09/19/2023   Lab Results  Component Value Date   HGBA1C 6.9 (H) 08/11/2023   Lab Results  Component Value Date   VITAMINB12 683 12/16/2021   Lab Results  Component Value Date   TSH 4.27 08/11/2023       ASSESSMENT AND PLAN  OSA (obstructive sleep apnea)  Nocturnal hypoxemia  Bursitis of left shoulder  Bursitis of right shoulder  Rash   She will retry CPAP.  We did discuss weight loss Bilateral subacromial bursa injected with 40 mg Depo-Medrol  and 2.5 cc Marcaine  into each  (80 mg total) using sterile technique. She tolerated the procedure well and pain was better afterwards.  Stay active and exercise as tolerated to help with weight loss.    I doubt lamotrigine  is causing rash as first appeared > 4 months after starting and rash is localized to groin.  However, if rash spreads, advised to  discontinue.   Return in 12 months or sooner if there are new or worsening neurologic symptoms.     Orie Cuttino A. Vear, MD, [facility] 11/29/2023, 3:30 PM Certified in Neurology, Clinical Neurophysiology, Sleep Medicine and Neuroimaging  [facility] [address] [place], [place] [address] [phone]

## 2023-11-29 NOTE — Telephone Encounter (Signed)
 Patient said having a allergic reaction to Lamictal , having a rash. Patient did not wish to elaborate on reaction. Patient request to schedule an appt. Patient said would send a message through MyChart for earlier appt than 06/2024

## 2023-11-29 NOTE — Telephone Encounter (Signed)
 Called pt at [phone]. LVM offering today at 2:30pm with Dr. Vear. Asked her to call back. I also replied to [employer].

## 2023-11-29 NOTE — Telephone Encounter (Signed)
 Pt called to confirm she can come in today . Scheduled Pt appt for today at 2:30 with Dr. Vear

## 2023-11-30 ENCOUNTER — Other Ambulatory Visit: Payer: Self-pay | Admitting: Internal Medicine

## 2023-11-30 DIAGNOSIS — Z124 Encounter for screening for malignant neoplasm of cervix: Secondary | ICD-10-CM

## 2023-12-10 ENCOUNTER — Other Ambulatory Visit: Payer: Self-pay | Admitting: Internal Medicine

## 2023-12-10 DIAGNOSIS — E118 Type 2 diabetes mellitus with unspecified complications: Secondary | ICD-10-CM

## 2023-12-13 LAB — FUNGUS CULTURE WITH STAIN

## 2023-12-13 LAB — FUNGAL ORGANISM REFLEX

## 2023-12-13 LAB — FUNGUS CULTURE RESULT

## 2023-12-30 ENCOUNTER — Other Ambulatory Visit: Payer: Self-pay | Admitting: Family

## 2023-12-30 DIAGNOSIS — I110 Hypertensive heart disease with heart failure: Secondary | ICD-10-CM

## 2023-12-30 DIAGNOSIS — I10 Essential (primary) hypertension: Secondary | ICD-10-CM

## 2023-12-30 LAB — ACID FAST CULTURE WITH REFLEXED SENSITIVITIES (MYCOBACTERIA): Acid Fast Culture: NEGATIVE

## 2024-01-06 ENCOUNTER — Other Ambulatory Visit: Payer: Self-pay | Admitting: Internal Medicine

## 2024-01-06 DIAGNOSIS — E118 Type 2 diabetes mellitus with unspecified complications: Secondary | ICD-10-CM

## 2024-01-08 ENCOUNTER — Other Ambulatory Visit: Payer: Self-pay | Admitting: Internal Medicine

## 2024-01-08 DIAGNOSIS — E118 Type 2 diabetes mellitus with unspecified complications: Secondary | ICD-10-CM

## 2024-01-10 NOTE — Telephone Encounter (Signed)
 Last OV 08/11/23, f/u 6 months  Next OV not scheduled - due for f/u 02/2024  Last refill 08/11/23 Qty #90/0

## 2024-01-10 NOTE — Telephone Encounter (Signed)
 Last OV 08/11/23 Next OV not scheduled  Last refill 12/14/23 Qty #4 mL / 0

## 2024-01-12 ENCOUNTER — Ambulatory Visit
Admission: RE | Admit: 2024-01-12 | Discharge: 2024-01-12 | Disposition: A | Discharge status: Home / Self Care | Source: Ambulatory Visit | Attending: Internal Medicine | Admitting: Internal Medicine

## 2024-01-12 DIAGNOSIS — Z1231 Encounter for screening mammogram for malignant neoplasm of breast: Secondary | ICD-10-CM

## 2024-01-23 ENCOUNTER — Telehealth: Payer: Self-pay

## 2024-01-23 NOTE — Telephone Encounter (Signed)
 Copied from CRM #[id number]. Topic: Clinical - Prescription Issue >> Jan 23, 2024 11:21 AM Berneda FALCON wrote: Reason for CRM: Pt states that this medication keeps getting kicked back because it was prescribed by [facility] while Dr. Joshua was out and needs a new order under Dr. Joshua.  Medication: indapamide  (LOZOL ) 1.25 MG tablet  Pharmacy: [facility] [address] [place] - [address] [place] [address] Phone: [phone] Fax: [phone] Hours: Not open 24 hours

## 2024-01-24 ENCOUNTER — Encounter: Payer: Self-pay | Admitting: Internal Medicine

## 2024-01-25 NOTE — Telephone Encounter (Signed)
 Copied from CRM [id number]. Topic: Clinical - Medication Question >> Jan 24, 2024  2:37 PM Lorraine ORN wrote: Reason for CRM: Patient called wanting her  indapamide  (LOZOL ) 1.25 MG tablet to be prescribed by her PCP, Dr. Joshua. Claims she's never known a Dr. [name] and doesn't know why her medication is being prescribed by her.  Please call the patient back and advise.

## 2024-01-26 ENCOUNTER — Other Ambulatory Visit: Payer: Self-pay

## 2024-01-26 DIAGNOSIS — I10 Essential (primary) hypertension: Secondary | ICD-10-CM

## 2024-01-26 DIAGNOSIS — I110 Hypertensive heart disease with heart failure: Secondary | ICD-10-CM

## 2024-01-26 MED ORDER — INDAPAMIDE 1.25 MG PO TABS: 1.2500 mg | ORAL_TABLET | Freq: Every day | ORAL | 1 refills | Status: AC

## 2024-01-26 NOTE — Telephone Encounter (Signed)
 Patients medication has been filled and patient has been made aware and gave a verbal understanding.

## 2024-01-26 NOTE — Telephone Encounter (Signed)
 Medication has refilled and she has been made aware.

## 2024-01-31 ENCOUNTER — Encounter: Payer: Self-pay | Admitting: Acute Care

## 2024-02-01 ENCOUNTER — Telehealth: Payer: Self-pay

## 2024-02-01 NOTE — Telephone Encounter (Signed)
 Patient's f/u Lung CT has been scheduled for 02/23/2024 and request her one week f/u appt to review results be scheduled with Dr. Shelah.   Dr. Karleen you okay if I schedule her to see you 9/28 at 10:30? You did her bronch 6/10/205, which was negative.

## 2024-02-01 NOTE — Telephone Encounter (Signed)
 Lets check the schedule - don't think it is 9/28

## 2024-02-01 NOTE — Telephone Encounter (Signed)
 Lorraine Holt, can you please verify what day you are referring to?  03/04/2024 is on a Sunday.  Thank you!

## 2024-02-03 ENCOUNTER — Encounter: Payer: Self-pay | Admitting: Internal Medicine

## 2024-02-03 ENCOUNTER — Other Ambulatory Visit: Payer: Self-pay | Admitting: Internal Medicine

## 2024-02-03 DIAGNOSIS — E118 Type 2 diabetes mellitus with unspecified complications: Secondary | ICD-10-CM

## 2024-02-06 ENCOUNTER — Other Ambulatory Visit: Payer: Self-pay | Admitting: Internal Medicine

## 2024-02-06 DIAGNOSIS — I2510 Atherosclerotic heart disease of native coronary artery without angina pectoris: Secondary | ICD-10-CM

## 2024-02-06 DIAGNOSIS — I110 Hypertensive heart disease with heart failure: Secondary | ICD-10-CM

## 2024-02-06 DIAGNOSIS — I10 Essential (primary) hypertension: Secondary | ICD-10-CM

## 2024-02-08 ENCOUNTER — Encounter: Payer: Self-pay | Admitting: Acute Care

## 2024-02-17 ENCOUNTER — Telehealth: Payer: Self-pay | Admitting: Cardiology

## 2024-02-17 ENCOUNTER — Encounter: Payer: Self-pay | Admitting: Cardiology

## 2024-02-17 NOTE — Telephone Encounter (Signed)
 Patient stated she updated her [place] policy and will need pre-authorization approval for visit on 12/5.

## 2024-02-23 ENCOUNTER — Encounter: Payer: Self-pay | Admitting: Internal Medicine

## 2024-02-23 ENCOUNTER — Ambulatory Visit
Admission: RE | Admit: 2024-02-23 | Discharge: 2024-02-23 | Disposition: A | Discharge status: Home / Self Care | Source: Ambulatory Visit | Attending: Internal Medicine | Admitting: Internal Medicine

## 2024-02-23 ENCOUNTER — Ambulatory Visit (INDEPENDENT_AMBULATORY_CARE_PROVIDER_SITE_OTHER): Admitting: Internal Medicine

## 2024-02-23 VITALS — BP 138/86 | HR 82 | Temp 97.7°F | Resp 16 | Ht 70.0 in | Wt 311.0 lb

## 2024-02-23 DIAGNOSIS — E118 Type 2 diabetes mellitus with unspecified complications: Secondary | ICD-10-CM | POA: Diagnosis not present

## 2024-02-23 DIAGNOSIS — I10 Essential (primary) hypertension: Secondary | ICD-10-CM

## 2024-02-23 DIAGNOSIS — Z87891 Personal history of nicotine dependence: Secondary | ICD-10-CM

## 2024-02-23 DIAGNOSIS — Z122 Encounter for screening for malignant neoplasm of respiratory organs: Secondary | ICD-10-CM

## 2024-02-23 DIAGNOSIS — Z23 Encounter for immunization: Secondary | ICD-10-CM

## 2024-02-23 DIAGNOSIS — K760 Fatty (change of) liver, not elsewhere classified: Secondary | ICD-10-CM | POA: Diagnosis not present

## 2024-02-23 DIAGNOSIS — J439 Emphysema, unspecified: Secondary | ICD-10-CM | POA: Diagnosis not present

## 2024-02-23 DIAGNOSIS — E785 Hyperlipidemia, unspecified: Secondary | ICD-10-CM | POA: Diagnosis not present

## 2024-02-23 DIAGNOSIS — R911 Solitary pulmonary nodule: Secondary | ICD-10-CM

## 2024-02-23 LAB — BASIC METABOLIC PANEL WITH GFR
BUN: 12 mg/dL (ref 6–23)
CO2: 27 meq/L (ref 19–32)
Calcium: 9.3 mg/dL (ref 8.4–10.5)
Chloride: 104 meq/L (ref 96–112)
Creatinine, Ser: 0.64 mg/dL (ref 0.40–1.20)
GFR: 95.92 mL/min (ref >60.00)
Glucose, Bld: 126 mg/dL — ABNORMAL HIGH (ref 70–99)
Potassium: 3.7 meq/L (ref 3.5–5.1)
Sodium: 138 meq/L (ref 135–145)

## 2024-02-23 LAB — CBC WITH DIFFERENTIAL/PLATELET
Basophils Absolute: 0.1 K/uL (ref 0.0–0.1)
Basophils Relative: 1.1 % (ref 0.0–3.0)
Eosinophils Absolute: 0.1 K/uL (ref 0.0–0.7)
Eosinophils Relative: 2.9 % (ref 0.0–5.0)
HCT: 42.3 % (ref 36.0–46.0)
Hemoglobin: 13.8 g/dL (ref 12.0–15.0)
Lymphocytes Relative: 33.5 % (ref 12.0–46.0)
Lymphs Abs: 1.6 K/uL (ref 0.7–4.0)
MCHC: 32.6 g/dL (ref 30.0–36.0)
MCV: 87.9 fl (ref 78.0–100.0)
Monocytes Absolute: 0.4 K/uL (ref 0.1–1.0)
Monocytes Relative: 7.7 % (ref 3.0–12.0)
Neutro Abs: 2.6 K/uL (ref 1.4–7.7)
Neutrophils Relative %: 54.8 % (ref 43.0–77.0)
Platelets: 194 K/uL (ref 150.0–400.0)
RBC: 4.82 Mil/uL (ref 3.87–5.11)
RDW: 15.1 % (ref 11.5–15.5)
WBC: 4.8 K/uL (ref 4.0–10.5)

## 2024-02-23 LAB — HEMOGLOBIN A1C: Hgb A1c MFr Bld: 7.3 % — ABNORMAL HIGH (ref 4.6–6.5)

## 2024-02-23 LAB — HEPATIC FUNCTION PANEL
ALT: 42 U/L — ABNORMAL HIGH (ref 0–35)
AST: 24 U/L (ref 0–37)
Albumin: 3.8 g/dL (ref 3.5–5.2)
Alkaline Phosphatase: 133 U/L — ABNORMAL HIGH (ref 39–117)
Bilirubin, Direct: 0.2 mg/dL (ref 0.0–0.3)
Total Bilirubin: 0.6 mg/dL (ref 0.2–1.2)
Total Protein: 6.9 g/dL (ref 6.0–8.3)

## 2024-02-23 LAB — PROTIME-INR
INR: 1 ratio (ref 0.8–1.0)
Prothrombin Time: 10.8 s (ref 9.6–13.1)

## 2024-02-23 NOTE — Progress Notes (Signed)
 Subjective:  Patient ID: Lorraine Holt, female    DOB: [birth year]  Age: 58 y.o. MRN: [id number]  CC: Hypertension, Hyperlipidemia, and Diabetes   HPI Lorraine Holt presents for f/up ----       Discussed the use of AI scribe software for clinical note transcription with the patient, who gave verbal consent to proceed.  History of Present Illness Lorraine Holt is a 58 year old female who presents with fatigue and knee pain after a recent fall.  She experiences fatigue, particularly during physical activity. She denies chest pain, shortness of breath, dizziness, or lightheadedness during activity. She occasionally experiences slight dizziness upon standing, which she attributes to intermittent elevated blood pressure.  She has knee pain and swelling localized around the knee area, without radiation to the feet. She takes ibuprofen  200 mg, two tablets in the morning and two at night, which helps alleviate the pain. She sometimes adjusts the dose based on her pain level.  She recently fell, tripping over a tire bumper, and describes the fall as 'slow motion'. She caught herself with her arm and reported some soreness and an abrasion. Despite bruising her knee, she does not believe an x-ray is necessary.  She has a history of gallbladder removal and manages constipation with Metamucil and occasionally milk of magnesia. She previously used Linzess , which was effective, but currently does not experience constipation as frequently. She sometimes goes days without a bowel movement or has small amounts. No abdominal pain or discomfort. Bowel movements are generally okay.  No issues with toenails, which she trims herself, and she does not feel the need to see a podiatrist.     Outpatient Medications Prior to Visit  Medication Sig Dispense Refill   aspirin  EC 81 MG tablet Take 81 mg by mouth daily. Swallow whole.     carvedilol  (COREG ) 3.125 MG tablet TAKE 1 TABLET BY MOUTH TWICE DAILY WITH A  MEAL 180 tablet 0   docusate sodium  (COLACE) 50 MG capsule Take 50 mg by mouth daily.     doxycycline  (VIBRA -TABS) 100 MG tablet Take 1 tablet (100 mg total) by mouth 2 (two) times daily. 14 tablet 0   ezetimibe  (ZETIA ) 10 MG tablet Take 1 tablet (10 mg total) by mouth daily. 90 tablet 3   ibuprofen  (ADVIL ) 200 MG tablet Take 400 mg by mouth 3 (three) times daily.     indapamide  (LOZOL ) 1.25 MG tablet Take 1 tablet (1.25 mg total) by mouth daily. 90 tablet 1   JARDIANCE  25 MG TABS tablet Take 1 tablet by mouth once daily 90 tablet 0   lamoTRIgine  (LAMICTAL ) 25 MG tablet Take 2 tablets by mouth twice daily 120 tablet 11   metFORMIN  (GLUCOPHAGE -XR) 750 MG 24 hr tablet Take 1 tablet by mouth once daily with breakfast 90 tablet 0   rosuvastatin  (CRESTOR ) 40 MG tablet Take 1 tablet (40 mg total) by mouth daily. 90 tablet 1   thiamine  (VITAMIN B-1) 50 MG tablet Take 1 tablet (50 mg total) by mouth daily. 90 tablet 1   TRUEPLUS LANCETS 30G MISC USE TWICE DAILY 100 each 11   TRULICITY  1.5 MG/0.5ML SOAJ INJECT 1.5 MG SUBCUTANEOUSLY ONCE A WEEK 4 mL 0   Wheat Dextrin (BENEFIBER PO) Take by mouth.     Evolocumab  (REPATHA  SURECLICK) 140 MG/ML SOAJ INJECT 140 MG INTO THE SKIN EVERY 14 DAYS (Patient not taking: Reported on 02/23/2024) 2 mL 11   No facility-administered medications prior to visit.  ROS Review of Systems  Constitutional: Negative.  Negative for appetite change, chills, diaphoresis, fatigue and fever.  HENT: Negative.  Negative for trouble swallowing.   Respiratory: Negative.  Negative for cough, chest tightness, shortness of breath and wheezing.   Cardiovascular:  Negative for chest pain, palpitations and leg swelling.  Gastrointestinal:  Positive for constipation. Negative for abdominal pain, blood in stool, nausea and vomiting.  Genitourinary:  Negative for difficulty urinating.  Musculoskeletal:  Positive for arthralgias and gait problem.  Skin: Negative.   Neurological:  Negative  for dizziness and weakness.  Hematological:  Negative for adenopathy. Does not bruise/bleed easily.  Psychiatric/Behavioral: Negative.      Objective:  BP 138/86 (BP Location: Left Arm, Patient Position: Sitting, Cuff Size: Large)   Pulse 82   Temp 97.7 F (36.5 C) (Oral)   Resp 16   Ht 5' 10 (1.778 m)   Wt (!) 311 lb (141.1 kg)   LMP 08/08/2014   SpO2 96%   BMI 44.62 kg/m   BP Readings from Last 3 Encounters:  02/23/24 138/86  11/29/23 123/82  11/22/23 115/76    Wt Readings from Last 3 Encounters:  02/23/24 (!) 311 lb (141.1 kg)  11/29/23 (!) 309 lb (140.2 kg)  11/22/23 (!) 308 lb 6.4 oz (139.9 kg)    Physical Exam Vitals reviewed.  Constitutional:      Appearance: Normal appearance.  HENT:     Nose: Nose normal.     Mouth/Throat:     Mouth: Mucous membranes are moist.  Eyes:     General: No scleral icterus.    Conjunctiva/sclera: Conjunctivae normal.  Cardiovascular:     Rate and Rhythm: Normal rate and regular rhythm.     Heart sounds: No murmur heard.    No friction rub. No gallop.  Pulmonary:     Effort: Pulmonary effort is normal.     Breath sounds: No stridor. No wheezing, rhonchi or rales.  Abdominal:     General: Abdomen is protuberant. Bowel sounds are normal. There is no distension.     Palpations: Abdomen is soft. There is no hepatomegaly, splenomegaly or mass.     Tenderness: There is no abdominal tenderness. There is no guarding or rebound.  Musculoskeletal:        General: Deformity (DJD) and signs of injury present. No tenderness. Normal range of motion.     Cervical back: Neck supple.     Right lower leg: No edema.     Left lower leg: No edema.  Lymphadenopathy:     Cervical: No cervical adenopathy.  Skin:    General: Skin is warm and dry.  Neurological:     General: No focal deficit present.     Mental Status: She is alert. Mental status is at baseline.  Psychiatric:        Mood and Affect: Mood normal.        Behavior: Behavior  normal.     Lab Results  Component Value Date   WBC 4.6 11/15/2023   HGB 14.7 11/15/2023   HCT 47.4 (H) 11/15/2023   PLT 241 11/15/2023   GLUCOSE 107 (H) 11/15/2023   CHOL 100 09/19/2023   TRIG 61 09/19/2023   HDL 55 09/19/2023   LDLCALC 31 09/19/2023   ALT 44 09/23/2023   AST 25 09/23/2023   NA 139 11/15/2023   K 3.9 11/15/2023   CL 104 11/15/2023   CREATININE 0.65 11/15/2023   BUN 11 11/15/2023   CO2 23 11/15/2023  TSH 4.27 08/11/2023   HGBA1C 6.9 (H) 08/11/2023   MICROALBUR <0.7 08/11/2023    MM 3D SCREENING MAMMOGRAM BILATERAL BREAST Result Date: 01/16/2024 CLINICAL DATA:  Screening. EXAM: DIGITAL SCREENING BILATERAL MAMMOGRAM WITH TOMOSYNTHESIS AND CAD TECHNIQUE: Bilateral screening digital craniocaudal and mediolateral oblique mammograms were obtained. Bilateral screening digital breast tomosynthesis was performed. The images were evaluated with computer-aided detection. COMPARISON:  Previous exam(s). ACR Breast Density Category b: There are scattered areas of fibroglandular density. FINDINGS: There are no findings suspicious for malignancy. IMPRESSION: No mammographic evidence of malignancy. A result letter of this screening mammogram will be mailed directly to the patient. RECOMMENDATION: Screening mammogram in one year. (Code:SM-B-01Y) BI-RADS CATEGORY  1: Negative. Electronically Signed   By: Norleen Croak M.D.   On: 01/16/2024 08:08    Assessment & Plan:  Essential hypertension, benign -     Basic metabolic panel with GFR; Future -     CBC with Differential/Platelet; Future  Type II diabetes mellitus with manifestations (HCC) -     Hemoglobin A1c; Future -     Basic metabolic panel with GFR; Future -     HM Diabetes Foot Exam  Hyperlipidemia LDL goal <55 -     Hepatic function panel; Future  Metabolic dysfunction-associated fatty liver disease (MAFLD) -     Protime-INR; Future  Flu vaccine need -     Flu vaccine trivalent PF, 6mos and  older(Flulaval,Afluria,Fluarix,Fluzone)     Follow-up: No follow-ups on file.  Debby Molt, MD

## 2024-02-23 NOTE — Patient Instructions (Signed)
 Fatty Liver Disease (Steatotic Liver Disease): What to Know  Your liver is an organ with many jobs. It makes proteins and helps change food into energy. It also gets rid of harmful things in your blood and absorbs vitamins from food. Fatty liver disease happens when too much fat builds up in your liver cells. It's also called steatotic liver disease. In many cases, fatty liver disease doesn't cause symptoms. But over time, it can cause irritation and swelling. This can lead to other liver problems, such as: Cirrhosis, or scarring of the liver. Liver cancer. Liver failure. What are the causes? Fatty liver disease may be caused by: Being overweight. Having: High cholesterol. High blood pressure. Cushing syndrome. Not getting enough nutrients in your diet. Other causes include: Certain drugs. Poisons. Some infections caused by a germ called a virus. What increases the risk? You're more likely to get fatty liver disease if: You drink alcohol. You're overweight. You have diabetes. You have hepatitis. You have a high triglyceride level. You're pregnant. What are the signs or symptoms? You may not have symptoms. If you do, they may include: Feeling weak and tired. Losing weight. Feeling like you may throw up. Throwing up. Jaundice. This is when your skin or the white parts of your eyes turn yellow. Swelling in your belly or legs. Tenderness in the right-upper part of your belly. How is this diagnosed? Fatty liver disease may be diagnosed based on your medical history and an exam. You may also need tests. These may include: Blood tests. An ultrasound. A CT scan. An MRI. A biopsy. This is when a small piece of tissue is removed from your liver for testing. How is this treated? Fatty liver disease is often caused by other conditions. You may need to take medicines and make changes to your daily life. These changes may help you manage conditions, such as: Alcohol use disorder. This  is a condition where you may not be able to stop drinking. High cholesterol. Diabetes. Being overweight. Follow these instructions at home: Eat healthy. Work with your health care provider or an expert in healthy eating called a dietitian. They can help you make an eating plan. Get enough exercise. This can help you lose weight. It can also help you manage your cholesterol and diabetes. Talk to your provider about an exercise plan. Ask what things are best for you to do. Do not drink alcohol. If you have trouble quitting, ask your provider for help. Take your medicines only as told. Keep all follow-up visits. Your provider will check if you're getting better. Contact a health care provider if: You can't control your blood sugar. This is extra important if you have diabetes. You have a fever. You have swelling in your belly or legs. You have belly pain. You have jaundice. You feel like you may throw up. You throw up. Get help right away if: You throw up, and it looks like: Bright red blood. Coffee grounds. You throw up something that looks like coffee ground. Your poop looks bloody or black. You get confused. These symptoms may be an emergency. Call 911 right away. Do not wait to see if the symptoms will go away. Do not drive yourself to the hospital. This information is not intended to replace advice given to you by your health care provider. Make sure you discuss any questions you have with your health care provider. Document Revised: 11/11/2022 Document Reviewed: 11/11/2022 Elsevier Patient Education  2024 [employer].

## 2024-02-24 ENCOUNTER — Ambulatory Visit: Payer: Self-pay | Admitting: Internal Medicine

## 2024-03-01 ENCOUNTER — Telehealth: Payer: Self-pay | Admitting: Acute Care

## 2024-03-01 NOTE — Telephone Encounter (Signed)
 Call report:  IMPRESSION: 1. Lung-RADS 4A, suspicious. Follow up low-dose chest CT without contrast in 3 months (please use the following order, CT CHEST LCS NODULE FOLLOW-UP W/O CM) is recommended. Alternatively, PET may be considered when there is a solid component 8mm or larger. New 6.8 mm nodule in the peripheral right lower lobe and ground-glass nodule in the medial left upper lobe. 2. Unchanged lateral right apical atypical pulmonary cyst with mural thickening, nodularity, and ground-glass density. Interval bronchoscopy. 3. Partially imaged right thyroid  nodule, as before. Thyroid  ultrasound examination is again recommended. 4. Aortic Atherosclerosis (ICD10-I70.0) and Emphysema (ICD10-J43.9). Coronary artery calcifications. Assessment for potential risk factor modification, dietary therapy or pharmacologic therapy may be warranted, if clinically indicated.   These results will be called to the ordering clinician or representative by the Radiologist Assistant, and communication documented in the PACS or [employer].     Electronically Signed   By: Limin  Xu M.D.   On: 03/01/2024 12:03

## 2024-03-02 ENCOUNTER — Ambulatory Visit: Admitting: Emergency Medicine

## 2024-03-05 ENCOUNTER — Other Ambulatory Visit: Payer: Self-pay

## 2024-03-05 ENCOUNTER — Telehealth: Payer: Self-pay

## 2024-03-05 ENCOUNTER — Other Ambulatory Visit: Payer: Self-pay | Admitting: Internal Medicine

## 2024-03-05 DIAGNOSIS — Z87891 Personal history of nicotine dependence: Secondary | ICD-10-CM

## 2024-03-05 DIAGNOSIS — R911 Solitary pulmonary nodule: Secondary | ICD-10-CM

## 2024-03-05 DIAGNOSIS — Z122 Encounter for screening for malignant neoplasm of respiratory organs: Secondary | ICD-10-CM

## 2024-03-05 DIAGNOSIS — E041 Nontoxic single thyroid nodule: Secondary | ICD-10-CM | POA: Insufficient documentation

## 2024-03-05 NOTE — Telephone Encounter (Signed)
 Results reviewed by Ruthell, NP. Please call patient and review recent Lung CT results. She will need a 3 month follow up scan to evaluate the new 6.8 mm nodule since 10/2023 scan. She will also need to follow up with PCP to schedule an US  to evaluate the thyroid  nodule if not already done. Place follow up CT order, send results/plan to PCP.

## 2024-03-05 NOTE — Telephone Encounter (Signed)
 See provider note 03/05/2024

## 2024-03-05 NOTE — Telephone Encounter (Signed)
 I have called and spoken with the patient. She is in agreement to complete a 3 month follow up scan due to new 6.8 mm nodule. Scheduled for 05/24/2024. She will follow up with Dr. Darra and discuss thyroid  US . Results and plan to PCP. She has also bee scheduled for an OV to review 3 month f/u results with Groce. NP 06/06/2024 per patient request.   Dr. Shirlee request results be sent to your office for follow up. If possible, patient would like to complete her thyroid  US  the same day she completes her Chest CT unless she needs the US  sooner. Will your office please follow up with the patient on scheduling a thyroid  US ?  Isaiah, RN

## 2024-03-07 ENCOUNTER — Other Ambulatory Visit: Payer: Self-pay | Admitting: Internal Medicine

## 2024-03-07 DIAGNOSIS — E118 Type 2 diabetes mellitus with unspecified complications: Secondary | ICD-10-CM

## 2024-03-07 NOTE — Telephone Encounter (Unsigned)
 Copied from CRM #[id number]. Topic: Clinical - Medication Refill >> Mar 07, 2024  3:14 PM Timindy P wrote: Medication: TRULICITY  1.5 MG/0.5ML SOAJ  Has the patient contacted their pharmacy? Yes-Pt is stating the pharmacy has been requesting this since last week and she is now out of medication. She was advised to call PCP's office  This is the patient's preferred pharmacy:  [facility] [address] [place] - [address] [place] [address] Phone: [phone] Fax: [phone]  Is this the correct pharmacy for this prescription? Yes If no, delete pharmacy and type the correct one.   Has the prescription been filled recently? No  Is the patient out of the medication? Yes  Has the patient been seen for an appointment in the last year OR does the patient have an upcoming appointment? Yes  Can we respond through MyChart? No- please call [phone]  Agent: Please be advised that Rx refills may take up to 3 business days. We ask that you follow-up with your pharmacy.

## 2024-03-08 ENCOUNTER — Telehealth: Payer: Self-pay

## 2024-03-08 ENCOUNTER — Other Ambulatory Visit: Payer: Self-pay | Admitting: Internal Medicine

## 2024-03-08 ENCOUNTER — Other Ambulatory Visit (HOSPITAL_COMMUNITY): Payer: Self-pay

## 2024-03-08 ENCOUNTER — Ambulatory Visit
Admission: RE | Admit: 2024-03-08 | Discharge: 2024-03-08 | Disposition: A | Discharge status: Home / Self Care | Source: Ambulatory Visit | Attending: Internal Medicine | Admitting: Internal Medicine

## 2024-03-08 DIAGNOSIS — E785 Hyperlipidemia, unspecified: Secondary | ICD-10-CM

## 2024-03-08 DIAGNOSIS — E118 Type 2 diabetes mellitus with unspecified complications: Secondary | ICD-10-CM

## 2024-03-08 DIAGNOSIS — E042 Nontoxic multinodular goiter: Secondary | ICD-10-CM | POA: Diagnosis not present

## 2024-03-08 DIAGNOSIS — K5904 Chronic idiopathic constipation: Secondary | ICD-10-CM

## 2024-03-08 DIAGNOSIS — E041 Nontoxic single thyroid nodule: Secondary | ICD-10-CM

## 2024-03-08 MED ORDER — TRULICITY 1.5 MG/0.5ML ~~LOC~~ SOAJ: 1.5000 mg | SUBCUTANEOUS | 1 refills | Status: AC

## 2024-03-08 MED ORDER — LUBIPROSTONE 24 MCG PO CAPS: 24.0000 ug | ORAL_CAPSULE | Freq: Two times a day (BID) | ORAL | 0 refills | Status: DC

## 2024-03-08 MED ORDER — ROSUVASTATIN CALCIUM 40 MG PO TABS: 40.0000 mg | ORAL_TABLET | Freq: Every day | ORAL | 1 refills | Status: AC

## 2024-03-08 NOTE — Telephone Encounter (Signed)
 Pharmacy Patient Advocate Encounter   Received notification from CoverMyMeds that prior authorization for Lubiprostone  capsules is required/requested.   Insurance verification completed.   The patient is insured through [facility] [facility].   Per test claim: PA required; PA submitted to above mentioned insurance via Latent Key/confirmation #/EOC [id number] Status is pending

## 2024-03-09 NOTE — Telephone Encounter (Signed)
 Pharmacy Patient Advocate Encounter  Received notification from [facility] that Prior Authorization for Lubiprostone  capsules has been APPROVED from 03/09/2024 to 03/10/2027   PA #/Case ID/Reference #: [id number]

## 2024-03-11 ENCOUNTER — Other Ambulatory Visit: Payer: Self-pay | Admitting: Internal Medicine

## 2024-03-11 ENCOUNTER — Ambulatory Visit: Payer: Self-pay | Admitting: Internal Medicine

## 2024-03-11 DIAGNOSIS — E041 Nontoxic single thyroid nodule: Secondary | ICD-10-CM

## 2024-03-27 ENCOUNTER — Telehealth: Payer: Self-pay

## 2024-03-27 DIAGNOSIS — Z6841 Body Mass Index (BMI) 40.0 and over, adult: Secondary | ICD-10-CM | POA: Diagnosis not present

## 2024-03-27 DIAGNOSIS — Z01419 Encounter for gynecological examination (general) (routine) without abnormal findings: Secondary | ICD-10-CM | POA: Diagnosis not present

## 2024-03-27 DIAGNOSIS — Z1211 Encounter for screening for malignant neoplasm of colon: Secondary | ICD-10-CM | POA: Diagnosis not present

## 2024-03-27 DIAGNOSIS — Z1272 Encounter for screening for malignant neoplasm of vagina: Secondary | ICD-10-CM | POA: Diagnosis not present

## 2024-03-27 DIAGNOSIS — Z1151 Encounter for screening for human papillomavirus (HPV): Secondary | ICD-10-CM | POA: Diagnosis not present

## 2024-03-27 NOTE — Telephone Encounter (Signed)
 Copied from CRM #[id number]. Topic: Clinical - Medication Question >> Mar 27, 2024  1:37 PM Russell PARAS wrote: Reason for CRM:   Pt is contacting clinic to speak with Groce or her nurse concerning the bacteria present in her lungs. She had a recent CT that showed bacteria still present and wants to know if another medication or treatment can be prescribed to assist with this before she has another CT performed.  Requests call back   CB#  [phone]  Called and spoke to patient sent sarah a secure chat awating response will call back

## 2024-04-03 ENCOUNTER — Other Ambulatory Visit: Payer: Self-pay | Admitting: Cardiology

## 2024-04-03 ENCOUNTER — Other Ambulatory Visit: Payer: Self-pay | Admitting: Internal Medicine

## 2024-04-03 DIAGNOSIS — E118 Type 2 diabetes mellitus with unspecified complications: Secondary | ICD-10-CM

## 2024-04-09 ENCOUNTER — Encounter: Payer: Self-pay | Admitting: Radiology

## 2024-04-09 ENCOUNTER — Ambulatory Visit (INDEPENDENT_AMBULATORY_CARE_PROVIDER_SITE_OTHER)

## 2024-04-09 ENCOUNTER — Encounter (INDEPENDENT_AMBULATORY_CARE_PROVIDER_SITE_OTHER): Payer: Self-pay

## 2024-04-09 VITALS — BP 137/88 | HR 77 | Ht 70.0 in | Wt 314.0 lb

## 2024-04-09 DIAGNOSIS — H9483 Other specified disorders of ear in diseases classified elsewhere, bilateral: Secondary | ICD-10-CM

## 2024-04-09 DIAGNOSIS — E041 Nontoxic single thyroid nodule: Secondary | ICD-10-CM | POA: Diagnosis not present

## 2024-04-09 DIAGNOSIS — H6121 Impacted cerumen, right ear: Secondary | ICD-10-CM

## 2024-04-09 DIAGNOSIS — H6593 Unspecified nonsuppurative otitis media, bilateral: Secondary | ICD-10-CM

## 2024-04-09 NOTE — Patient Instructions (Signed)
 I have ordered an imaging study for you to complete prior to your next visit. Please call [facility] Scheduling at [phone] to schedule your imaging if you have not received a call within 24 hours. If you are unable to complete your imaging study prior to your next scheduled visit please call our office to let us  know.

## 2024-04-09 NOTE — Progress Notes (Signed)
 Dear Dr. Joshua, Here is my assessment for our mutual patient, Lorraine Holt. Thank you for allowing me the opportunity to care for your patient. Please do not hesitate to contact me should you have any other questions. Sincerely, Dr. Hadassah Parody  Otolaryngology Clinic Note Referring provider: Dr. Joshua HPI:   Initial HPI (04/09/2024) Discussed the use of AI scribe software for clinical note transcription with the patient, who gave verbal consent to proceed.  History of Present Illness Lorraine Holt is a 58 year old female who presents with a thyroid  nodule and right ear discomfort.  Thyroid  nodules and dysphagia - Recent ultrasound identified multiple thyroid  nodules, with one nodule requiring further evaluation by biopsy. - No significant compressive symptoms   Otic discomfort - Bilateral ear discomfort characterized by aching, intermittent pressure, and itching, worse on right  - Attempts to alleviate symptoms by cleaning her ears. - History of frequent earaches during childhood. - No recent ear infections    Independent Review of Additional Tests or Records:  Telephone note Lorraine Joshua, MD (03/11/24): ordered referral due to suspicious nodule on right thyroid   02/23/24 CT chest shows thyroid  nodule on right side  03/08/24 Thyroid  US  shows multinodular goiter with TR3 nodule at right inferior lobe   TSH 08/11/23: 4.27  PMH/Meds/All/SocHx/FamHx/ROS:   Past Medical History:  Diagnosis Date   Anemia    history age 90'S   Arthritis    Phreesia 02/24/2020   Bronchitis    Carpal tunnel syndrome of right wrist    Diabetes mellitus without complication (HCC)    Phreesia 02/24/2020   Enlarged heart    per patient follow with cardiology   Hyperlipidemia    Hypertension    Obesity    Peripheral vascular disease    undiagnosis, but complains of numbness in legs   Shortness of breath dyspnea    with exertion   Sleep apnea    Phreesia 02/24/2020   Tooth abscess 09/30/2014    tooth was extracted     Past Surgical History:  Procedure Laterality Date   ABDOMINAL HYSTERECTOMY  09/2015   BILATERAL SALPINGECTOMY Right 09/17/2014   Procedure: RIGHT SALPINGECTOMY;  Surgeon: Elveria Mungo, MD;  Location: [facility];  Service: Gynecology;  Laterality: Right;   BREAST BIOPSY Left 03/2014   BRONCHIAL BIOPSY  11/15/2023   Procedure: BRONCHOSCOPY, WITH BIOPSY;  Surgeon: Shelah Lamar RAMAN, MD;  Location: [facility];  Service: Pulmonary;;   BRONCHIAL BRUSHINGS  11/15/2023   Procedure: BRONCHOSCOPY, WITH BRUSH BIOPSY;  Surgeon: Shelah Lamar RAMAN, MD;  Location: [facility];  Service: Pulmonary;;   BRONCHIAL NEEDLE ASPIRATION BIOPSY  11/15/2023   Procedure: BRONCHOSCOPY, WITH NEEDLE ASPIRATION BIOPSY;  Surgeon: Shelah Lamar RAMAN, MD;  Location: [facility];  Service: Pulmonary;;   BRONCHIAL WASHINGS  11/15/2023   Procedure: IRRIGATION, BRONCHUS;  Surgeon: Shelah Lamar RAMAN, MD;  Location: [facility];  Service: Pulmonary;;   BRONCHOSCOPY, WITH BIOPSY USING ELECTROMAGNETIC NAVIGATION Right 11/15/2023   Procedure: BRONCHOSCOPY, WITH BIOPSY USING ELECTROMAGNETIC NAVIGATION;  Surgeon: Shelah Lamar RAMAN, MD;  Location: [facility];  Service: Pulmonary;  Laterality: Right;   CHOLECYSTECTOMY Left 09/23/2023   Procedure: LAPAROSCOPIC CHOLECYSTECTOMY;  Surgeon: Tanda Locus, MD;  Location: [facility];  Service: General;  Laterality: Left;   COLONOSCOPY  04/2014   DILATION AND CURETTAGE OF UTERUS     FIDUCIAL MARKER PLACEMENT  11/15/2023   Procedure: INSERTION, FIDUCIAL MARKERS;  Surgeon: Shelah Lamar RAMAN, MD;  Location: [facility];  Service: Pulmonary;;   GALLBLADDER SURGERY  10/20/2023   ROBOTIC ASSISTED TOTAL HYSTERECTOMY N/A 09/17/2014   Procedure: ROBOTIC ASSISTED TOTAL HYSTERECTOMY;  Surgeon: Elveria Mungo, MD;  Location: [facility];  Service: Gynecology;  Laterality: N/A;   TOOTH EXTRACTION  09/30/2014   TUBAL LIGATION      Family History  Problem Relation Age of Onset    Hypertension Mother    Diabetes Mother    Breast cancer Mother    Diabetes Maternal Aunt    Breast cancer Maternal Aunt    Breast cancer Maternal Aunt    Diabetes Maternal Uncle    COPD Neg Hx    Early death Neg Hx    Heart disease Neg Hx    Hyperlipidemia Neg Hx    Kidney disease Neg Hx    Stroke Neg Hx      Social Connections: Socially Integrated (02/22/2024)   Social Connection and Isolation Panel    Frequency of Communication with Friends and Family: Three times a week    Frequency of Social Gatherings with Friends and Family: Once a week    Attends Religious Services: More than 4 times per year    Active Member of [employer] or Organizations: Yes    Attends Engineer, Structural: More than 4 times per year    Marital Status: Married     Current Outpatient Medications  Medication Instructions   aspirin  EC 81 mg, Daily   carvedilol  (COREG ) 3.125 mg, Oral, 2 times daily with meals   docusate sodium  (COLACE) 50 mg, Daily   Evolocumab  (REPATHA  SURECLICK) 140 MG/ML SOAJ INJECT 140 MG INTO THE SKIN EVERY 14 DAYS   ezetimibe  (ZETIA ) 10 mg, Oral, Daily   ibuprofen  (ADVIL ) 400 mg, 3 times daily   indapamide  (LOZOL ) 1.25 mg, Oral, Daily   Jardiance  25 mg, Oral, Daily   lamoTRIgine  (LAMICTAL ) 50 mg, Oral, 2 times daily   lubiprostone  (AMITIZA ) 24 mcg, Oral, 2 times daily with meals   metFORMIN  (GLUCOPHAGE -XR) 750 mg, Oral, Daily with breakfast   rosuvastatin  (CRESTOR ) 40 mg, Oral, Daily   thiamine  (VITAMIN B-1) 50 mg, Oral, Daily   TRUEPLUS LANCETS 30G MISC USE TWICE DAILY   Trulicity  1.5 mg, Subcutaneous, Weekly   Wheat Dextrin (BENEFIBER PO) Take by mouth.     Physical Exam:   BP 137/88 (BP Location: Right Arm, Patient Position: Sitting)   Pulse 77   Ht 5' 10 (1.778 m)   Wt (!) 314 lb (142.4 kg)   LMP 08/08/2014   SpO2 96%   BMI 45.05 kg/m   Salient findings:  CN II-XII intact Given history and complaints, ear microscopy was indicated and performed for  evaluation with findings as below in physical exam section and in procedures Left: EAC was patent. TM was intact . Middle ear had clear effusion present. Drainage: none Right: EAC was impacted. TM was intact . Middle ear with effusion present. Drainage: none Patient tolerated the procedure well. No lesions of oral cavity/oropharynx No obviously palpable neck masses/lymphadenopathy Right thyroid  palpable  No respiratory distress or stridor  Seprately Identifiable Procedures:  Prior to initiating any procedures, risks/benefits/alternatives were explained to the patient and verbal consent obtained.  Procedure (04/09/2024): Bilateral ear microscopy and cerumen removal using microscope (CPT [phone]) - Mod 25- right Pre-procedure diagnosis: right Cerumen impaction  Post-procedure diagnosis: same Indication: right cerumen impaction; given patient's otologic complaints and history as well as for improved and comprehensive examination of external ear and tympanic membrane, bilateral otologic examination using microscope was performed and impacted cerumen removed  Procedure: Patient was placed semi-recumbent. Both ear canals were examined using the microscope with findings above. Cerumen removed on right using suction and currette with improvement in EAC examination and patency.   Impression & Plans:  Lorraine Holt is a 58 y.o. female with   1. Thyroid  nodule   2. Fluid level behind tympanic membrane of both ears   3. Impacted cerumen of right ear    Assessment and Plan Assessment & Plan Multinodular thyroid  with right thyroid  nodule Ultrasound showed a right thyroid  nodule requiring biopsy. Characteristics necessitate further evaluation to determine benignancy or malignancy.  - Ordered biopsy of right thyroid  nodule. - Scheduled follow-up appointment post-biopsy to discuss results and potential treatment options.  Bilateral middle ear effusion Fluid present behind both ears causing discomfort.  No infection noted. Hearing test planned to assess impact. Ear tubes considered if fluid persists and affects hearing. - Will order hearing test to be done prior to next visit     See below regarding exact medications prescribed this encounter including dosages and route: No orders of the defined types were placed in this encounter.   Thank you for allowing me the opportunity to care for your patient. Please do not hesitate to contact me should you have any other questions.  Sincerely, Hadassah Parody, MD Otolaryngologist (ENT), [facility] Phone: [phone] Fax: [phone]

## 2024-04-10 ENCOUNTER — Encounter: Payer: Self-pay | Admitting: Gastroenterology

## 2024-04-23 ENCOUNTER — Encounter: Payer: Self-pay | Admitting: Neurology

## 2024-04-24 ENCOUNTER — Ambulatory Visit: Admitting: Orthopaedic Surgery

## 2024-04-24 ENCOUNTER — Other Ambulatory Visit (INDEPENDENT_AMBULATORY_CARE_PROVIDER_SITE_OTHER): Payer: Self-pay

## 2024-04-24 DIAGNOSIS — M170 Bilateral primary osteoarthritis of knee: Secondary | ICD-10-CM

## 2024-04-24 DIAGNOSIS — Z6841 Body Mass Index (BMI) 40.0 and over, adult: Secondary | ICD-10-CM

## 2024-04-24 NOTE — Progress Notes (Signed)
 Office Visit Note   Patient: Lorraine Holt           Date of Birth: 10/02/1965           MRN: [id number] Visit Date: 04/24/2024              Requested by: Joshua Debby CROME, MD [address] [place],  [place] [address] PCP: Joshua Debby CROME, MD   Assessment & Plan: Visit Diagnoses:  1. Bilateral primary osteoarthritis of knee   2. Body mass index 45.0-49.9, adult (HCC)     Plan: History of Present Illness Lorraine Holt is a 58 year old female with bilateral knee arthritis who presents for follow-up and consideration of additional gel injections.  She received monovisc injections in December, which provided relief until a few months ago. She is now seeking additional gel injections. Approximately one month ago, she fell, landing on her right knee and using her left knee to push herself back up. She has not experienced any recent falls or injuries aside from this incident. She manages her knee pain with Tylenol , and her pain is less severe than last year.  Bilateral knee exams are unchanged from prior visit.  Assessment and Plan Bilateral primary osteoarthritis of knee Chronic bilateral knee osteoarthritis with degenerative changes. Previous Monovisc injections effective until recently. Recent fall on right knee without acute fractures or dislocations. Buckling due to arthritis and cartilage degradation. Pain not severe enough for immediate cortisone injections due to potential tissue degradation. - Submitted approval for gel injections. - Advised to wait for gel injections unless pain becomes severe.  This patient is diagnosed with osteoarthritis of the knee(s).    Radiographs show evidence of joint space narrowing, osteophytes, subchondral sclerosis and/or subchondral cysts.  This patient has knee pain which interferes with functional and activities of daily living.    This patient has experienced inadequate response, adverse effects and/or intolerance with conservative treatments  such as acetaminophen , NSAIDS, topical creams, physical therapy or regular exercise, knee bracing and/or weight loss.   This patient has experienced inadequate response or has a contraindication to intra articular steroid injections for at least 3 months.   This patient is not scheduled to have a total knee replacement within 6 months of starting treatment with viscosupplementation.  Follow-Up Instructions: No follow-ups on file.   Orders:  Orders Placed This Encounter  Procedures   XR KNEE 3 VIEW RIGHT   XR KNEE 3 VIEW LEFT   Ambulatory request for injection medication   No orders of the defined types were placed in this encounter.     Procedures: No procedures performed   Clinical Data: No additional findings.   Subjective: Chief Complaint  Patient presents with   Right Knee - Pain   Left Knee - Pain    HPI  Review of Systems  Constitutional: Negative.   HENT: Negative.    Eyes: Negative.   Respiratory: Negative.    Cardiovascular: Negative.   Endocrine: Negative.   Musculoskeletal: Negative.   Neurological: Negative.   Hematological: Negative.   Psychiatric/Behavioral: Negative.    All other systems reviewed and are negative.    Objective: Vital Signs: LMP 08/08/2014   Physical Exam Vitals and nursing note reviewed.  Constitutional:      Appearance: She is well-developed.  HENT:     Head: Atraumatic.     Nose: Nose normal.  Eyes:     Extraocular Movements: Extraocular movements intact.  Cardiovascular:     Pulses:  Normal pulses.  Pulmonary:     Effort: Pulmonary effort is normal.  Abdominal:     Palpations: Abdomen is soft.  Musculoskeletal:     Cervical back: Neck supple.  Skin:    General: Skin is warm.     Capillary Refill: Capillary refill takes less than 2 seconds.  Neurological:     Mental Status: She is alert. Mental status is at baseline.  Psychiatric:        Behavior: Behavior normal.        Thought Content: Thought content  normal.        Judgment: Judgment normal.     Ortho Exam  Specialty Comments:  No specialty comments available.  Imaging: XR KNEE 3 VIEW RIGHT Result Date: 04/24/2024 X-rays demonstrate severe tricompartmental osteoarthritis.  Bone-on-bone joint space narrowing.  Kellgren-Lawrence stage IV  XR KNEE 3 VIEW LEFT Result Date: 04/24/2024 X-rays of the left knee show advanced tricompartmental osteoarthritis.  Bone-on-bone joint space narrowing.  Kellgren-Lawrence stage IV    PMFS History: Patient Active Problem List   Diagnosis Date Noted   Right thyroid  nodule 03/05/2024   Metabolic dysfunction-associated fatty liver disease (MAFLD) 02/23/2024   Lung nodule seen on imaging study 11/08/2023   Screening mammogram for breast cancer 12/22/2022   Estrogen deficiency 12/22/2022   High serum lipoprotein(a) 12/22/2022   Encounter for general adult medical examination with abnormal findings 12/22/2022   Panlobular emphysema (HCC) 12/17/2022   Coronary artery disease involving native coronary artery of native heart without angina pectoris 12/16/2022   Subacromial bursitis of right shoulder joint 02/16/2022   Thiamine  deficiency neuropathy 12/20/2021   Atherosclerosis of aorta 01/28/2021   Claudication of both lower extremities 05/10/2019   Chronic diastolic heart failure (HCC) 05/10/2019   Mucopurulent chronic bronchitis (HCC) 05/10/2019   Visit for screening mammogram 07/18/2018   LVH (left ventricular hypertrophy) due to hypertensive disease, with heart failure (HCC) 12/28/2017   Meralgia paresthetica, bilateral lower limbs 04/19/2017   Chronic idiopathic constipation 05/25/2016   OSA (obstructive sleep apnea) 05/25/2016   Primary osteoarthritis of both knees 05/25/2016   Tobacco abuse 07/23/2015   Iron deficiency anemia 05/22/2015   Abnormal mammogram of both breasts 10/01/2014   Severe obesity (BMI >= 40) (HCC) 07/23/2014   Carpal tunnel syndrome of right wrist 06/26/2014    Hyperlipidemia LDL goal <55 08/08/2013   Cervical cancer screening 04/30/2013   Nonspecific abnormal electrocardiogram (ECG) (EKG) 07/12/2012   Type II diabetes mellitus with manifestations (HCC) 02/21/2012   Essential hypertension, benign 02/21/2012   Past Medical History:  Diagnosis Date   Anemia    history age 20'S   Arthritis    Phreesia 02/24/2020   Bronchitis    Carpal tunnel syndrome of right wrist    Diabetes mellitus without complication (HCC)    Phreesia 02/24/2020   Enlarged heart    per patient follow with cardiology   Hyperlipidemia    Hypertension    Obesity    Peripheral vascular disease    undiagnosis, but complains of numbness in legs   Shortness of breath dyspnea    with exertion   Sleep apnea    Phreesia 02/24/2020   Tooth abscess 09/30/2014   tooth was extracted    Family History  Problem Relation Age of Onset   Hypertension Mother    Diabetes Mother    Breast cancer Mother    Diabetes Maternal Aunt    Breast cancer Maternal Aunt    Breast cancer Maternal Aunt  Diabetes Maternal Uncle    COPD Neg Hx    Early death Neg Hx    Heart disease Neg Hx    Hyperlipidemia Neg Hx    Kidney disease Neg Hx    Stroke Neg Hx     Past Surgical History:  Procedure Laterality Date   ABDOMINAL HYSTERECTOMY  09/2015   BILATERAL SALPINGECTOMY Right 09/17/2014   Procedure: RIGHT SALPINGECTOMY;  Surgeon: Elveria Mungo, MD;  Location: [facility];  Service: Gynecology;  Laterality: Right;   BREAST BIOPSY Left 03/2014   BRONCHIAL BIOPSY  11/15/2023   Procedure: BRONCHOSCOPY, WITH BIOPSY;  Surgeon: Shelah Lamar RAMAN, MD;  Location: [facility] END[facility]COPY;  Service: Pulmonary;;   BRONCHIAL BRUSHINGS  11/15/2023   Procedure: BRONCHOSCOPY, WITH BRUSH BIOPSY;  Surgeon: Shelah Lamar RAMAN, MD;  Location: [facility];  Service: Pulmonary;;   BRONCHIAL NEEDLE ASPIRATION BIOPSY  11/15/2023   Procedure: BRONCHOSCOPY, WITH NEEDLE ASPIRATION BIOPSY;  Surgeon: Shelah Lamar RAMAN, MD;   Location: [facility];  Service: Pulmonary;;   BRONCHIAL WASHINGS  11/15/2023   Procedure: IRRIGATION, BRONCHUS;  Surgeon: Shelah Lamar RAMAN, MD;  Location: [facility];  Service: Pulmonary;;   BRONCHOSCOPY, WITH BIOPSY USING ELECTROMAGNETIC NAVIGATION Right 11/15/2023   Procedure: BRONCHOSCOPY, WITH BIOPSY USING ELECTROMAGNETIC NAVIGATION;  Surgeon: Shelah Lamar RAMAN, MD;  Location: [facility];  Service: Pulmonary;  Laterality: Right;   CHOLECYSTECTOMY Left 09/23/2023   Procedure: LAPAROSCOPIC CHOLECYSTECTOMY;  Surgeon: Tanda Locus, MD;  Location: [facility];  Service: General;  Laterality: Left;   COLONOSCOPY  04/2014   DILATION AND CURETTAGE OF UTERUS     FIDUCIAL MARKER PLACEMENT  11/15/2023   Procedure: INSERTION, FIDUCIAL MARKERS;  Surgeon: Shelah Lamar RAMAN, MD;  Location: [facility];  Service: Pulmonary;;   GALLBLADDER SURGERY  10/20/2023   ROBOTIC ASSISTED TOTAL HYSTERECTOMY N/A 09/17/2014   Procedure: ROBOTIC ASSISTED TOTAL HYSTERECTOMY;  Surgeon: Elveria Mungo, MD;  Location: [facility];  Service: Gynecology;  Laterality: N/A;   TOOTH EXTRACTION  09/30/2014   TUBAL LIGATION     Social History   Occupational History   Occupation: [employer]  Tobacco Use   Smoking status: Former    Current packs/day: 1.00    Types: Cigarettes    Passive exposure: Past   Smokeless tobacco: Never   Tobacco comments:    Pt quit smoking the end of January 2025-08/23/2023    Pt. With a 32 pack year smoking history  Vaping Use   Vaping status: Not on file  Substance and Sexual Activity   Alcohol  use: No   Drug use: No   Sexual activity: Never    Birth control/protection: Surgical

## 2024-04-26 ENCOUNTER — Ambulatory Visit (AMBULATORY_SURGERY_CENTER)

## 2024-04-26 ENCOUNTER — Encounter: Admitting: Emergency Medicine

## 2024-04-26 ENCOUNTER — Encounter: Payer: Self-pay | Admitting: Internal Medicine

## 2024-04-26 VITALS — Ht 70.0 in | Wt 312.0 lb

## 2024-04-26 DIAGNOSIS — Z1211 Encounter for screening for malignant neoplasm of colon: Secondary | ICD-10-CM

## 2024-04-26 MED ORDER — NA SULFATE-K SULFATE-MG SULF 17.5-3.13-1.6 GM/177ML PO SOLN: 1.0000 | Freq: Once | ORAL | 0 refills | Status: AC

## 2024-04-26 NOTE — Progress Notes (Signed)
 No egg or soy allergy known to patient  No issues known to pt with past sedation with any surgeries or procedures Patient denies ever being told they had issues or difficulty with intubation  No FH of Malignant Hyperthermia Pt is on diet pills- Trulicity , hold instructions provided Pt is not on  home 02  Pt is not on blood thinners  Pt has intermittent issues with constipation, takes lubiprostone  daily.  Will take extra Miralax  with suprep No A fib or A flutter Have any cardiac testing pending--No, only routine appt Pt can ambulate  Pt denies use of chewing tobacco Discussed diabetic I weight loss medication holds Discussed NSAID holds Checked BMI Pt instructed to use Single[employer] or GoodRx for a price reduction on prep  Patient's chart reviewed by Norleen Schillings CNRA prior to previsit and patient appropriate for the [facility].  Pre visit completed and red dot placed by patient's name on their procedure day (on provider's schedule).

## 2024-04-27 ENCOUNTER — Other Ambulatory Visit: Payer: Self-pay | Admitting: Internal Medicine

## 2024-04-27 DIAGNOSIS — E118 Type 2 diabetes mellitus with unspecified complications: Secondary | ICD-10-CM

## 2024-04-27 MED ORDER — BLOOD GLUCOSE MONITORING SUPPL DEVI: 1.0000 | 0 refills | Status: AC

## 2024-04-27 MED ORDER — LANCETS MISC
1.0000 | 0 refills | Status: AC
Start: 2024-04-27 — End: ?

## 2024-04-27 MED ORDER — BLOOD GLUCOSE TEST VI STRP: 1.0000 | ORAL_STRIP | 0 refills | Status: AC

## 2024-04-27 MED ORDER — LANCET DEVICE MISC: 1.0000 | 0 refills | Status: AC

## 2024-04-27 NOTE — Progress Notes (Signed)
 HPI: Follow-up coronary artery disease. Monitor May 2018 showed normal sinus rhythm. Echocardiogram July 2019 showed normal LV function, grade 1 diastolic dysfunction. ABIs December 2020 normal. Laboratories July 2024 showed LP(a) 218.  ABI September 2024 normal.  Calcium  score October 2024 182 which was 95th percentile, 41 mm ascending thoracic aorta.  Since last seen she has dyspnea on exertion unchanged.  No orthopnea, PND, pedal edema, exertional chest pain or syncope.  Current Outpatient Medications  Medication Sig Dispense Refill   acetaminophen  (TYLENOL ) 500 MG tablet Take 500 mg by mouth 2 (two) times daily.     aspirin  EC 81 MG tablet Take 81 mg by mouth daily. Swallow whole.     Blood Glucose Monitoring Suppl DEVI 1 each by Does not apply route as directed. Dispense based on patient and insurance preference. Use up to four times daily as directed. (FOR ICD-10 E10.9, E11.9). 1 each 0   carvedilol  (COREG ) 3.125 MG tablet TAKE 1 TABLET BY MOUTH TWICE DAILY WITH A MEAL 180 tablet 0   Dulaglutide  (TRULICITY ) 1.5 MG/0.5ML SOAJ Inject 1.5 mg into the skin once a week. 6 mL 1   Evolocumab  (REPATHA  SURECLICK) 140 MG/ML SOAJ INJECT 140 MG INTO THE SKIN EVERY 14 DAYS 2 mL 11   ezetimibe  (ZETIA ) 10 MG tablet Take 1 tablet by mouth once daily 90 tablet 0   Glucose Blood (BLOOD GLUCOSE TEST STRIPS) STRP 1 each by Does not apply route as directed. Dispense based on patient and insurance preference. Use up to four times daily as directed. (FOR ICD-10 E10.9, E11.9). 100 strip 0   indapamide  (LOZOL ) 1.25 MG tablet Take 1 tablet (1.25 mg total) by mouth daily. 90 tablet 1   lamoTRIgine  (LAMICTAL ) 25 MG tablet Take 2 tablets by mouth twice daily 120 tablet 11   Lancet Device MISC 1 each by Does not apply route as directed. Dispense based on patient and insurance preference. Use up to four times daily as directed. (FOR ICD-10 E10.9, E11.9). 1 each 0   Lancets MISC 1 each by Does not apply route as  directed. Dispense based on patient and insurance preference. Use up to four times daily as directed. (FOR ICD-10 E10.9, E11.9). 100 each 0   lubiprostone  (AMITIZA ) 24 MCG capsule Take 1 capsule (24 mcg total) by mouth 2 (two) times daily with a meal. 180 capsule 0   metFORMIN  (GLUCOPHAGE -XR) 750 MG 24 hr tablet Take 1 tablet by mouth once daily with breakfast 90 tablet 0   rosuvastatin  (CRESTOR ) 40 MG tablet Take 1 tablet (40 mg total) by mouth daily. 90 tablet 1   thiamine  (VITAMIN B-1) 50 MG tablet Take 1 tablet (50 mg total) by mouth daily. 90 tablet 1   Tiotropium Bromide (SPIRIVA  RESPIMAT) 2.5 MCG/ACT AERS Inhale 2 puffs into the lungs daily. 4 g 0   TRUEPLUS LANCETS 30G MISC USE TWICE DAILY 100 each 11   Wheat Dextrin (BENEFIBER PO) Take by mouth.     No current facility-administered medications for this visit.     Past Medical History:  Diagnosis Date   Anemia    history age 50'S   Arthritis    [employer]reesia 02/24/2020   Bronchitis    Carpal tunnel syndrome of right wrist    COPD (chronic obstructive pulmonary disease) (HCC)    Diabetes mellitus without complication (HCC)    [facility]reesia 02/24/2020   Enlarged heart    per patient follow with cardiology   Hyperlipidemia    Hypertension  Obesity    Peripheral vascular disease    undiagnosis, but complains of numbness in legs   Shortness of breath dyspnea    with exertion   Sleep apnea    [employer]reesia 02/24/2020   Tooth abscess 09/30/2014   tooth was extracted    Past Surgical History:  Procedure Laterality Date   ABDOMINAL HYSTERECTOMY  09/2015   BILATERAL SALPINGECTOMY Right 09/17/2014   Procedure: RIGHT SALPINGECTOMY;  Surgeon: Elveria Mungo, MD;  Location: [facility];  Service: Gynecology;  Laterality: Right;   BREAST BIOPSY Left 03/2014   BRONCHIAL BIOPSY  11/15/2023   Procedure: BRONCHOSCOPY, WITH BIOPSY;  Surgeon: Shelah Lamar RAMAN, MD;  Location: [facility] ENDOSCOPY;  Service: Pulmonary;;   BRONCHIAL BRUSHINGS  11/15/2023    Procedure: BRONCHOSCOPY, WITH BRUSH BIOPSY;  Surgeon: Shelah Lamar RAMAN, MD;  Location: [facility] ENDOSCOPY;  Service: Pulmonary;;   BRONCHIAL NEEDLE ASPIRATION BIOPSY  11/15/2023   Procedure: BRONCHOSCOPY, WITH NEEDLE ASPIRATION BIOPSY;  Surgeon: Shelah Lamar RAMAN, MD;  Location: [facility] ENDOSCOPY;  Service: Pulmonary;;   BRONCHIAL WASHINGS  11/15/2023   Procedure: IRRIGATION, BRONCHUS;  Surgeon: Shelah Lamar RAMAN, MD;  Location: [facility] ENDOSCOPY;  Service: Pulmonary;;   BRONCHOSCOPY, WITH BIOPSY USING ELECTROMAGNETIC NAVIGATION Right 11/15/2023   Procedure: BRONCHOSCOPY, WITH BIOPSY USING ELECTROMAGNETIC NAVIGATION;  Surgeon: Shelah Lamar RAMAN, MD;  Location: [facility] ENDOSCOPY;  Service: Pulmonary;  Laterality: Right;   CHOLECYSTECTOMY Left 09/23/2023   Procedure: LAPAROSCOPIC CHOLECYSTECTOMY;  Surgeon: Tanda Locus, MD;  Location: [facility];  Service: General;  Laterality: Left;   COLONOSCOPY  04/2014   DILATION AND CURETTAGE OF UTERUS     FIDUCIAL MARKER PLACEMENT  11/15/2023   Procedure: INSERTION, FIDUCIAL MARKERS;  Surgeon: Shelah Lamar RAMAN, MD;  Location: [facility] ENDOSCOPY;  Service: Pulmonary;;   GALLBLADDER SURGERY  10/20/2023   ROBOTIC ASSISTED TOTAL HYSTERECTOMY N/A 09/17/2014   Procedure: ROBOTIC ASSISTED TOTAL HYSTERECTOMY;  Surgeon: Elveria Mungo, MD;  Location: [facility];  Service: Gynecology;  Laterality: N/A;   TOOTH EXTRACTION  09/30/2014   TUBAL LIGATION      Social History   Socioeconomic History   Marital status: Married    Spouse name: Not on file   Number of children: 4   Years of education: Not on file   Highest education level: Some college, no degree  Occupational History   Occupation: [employer]  Tobacco Use   Smoking status: Former    Current packs/day: 1.00    Types: Cigarettes    Passive exposure: Past   Smokeless tobacco: Never   Tobacco comments:    Pt quit smoking the end of January 2025-08/23/2023    Pt. With a 32 pack year smoking history  Vaping Use   Vaping  status: Not on file  Substance and Sexual Activity   Alcohol  use: No   Drug use: No   Sexual activity: Never    Birth control/protection: Surgical  Other Topics Concern   Not on file  Social History Narrative   Pt lives husband    Pt works    Social Drivers of Corporate Investment Banker Strain: Low Risk  (02/22/2024)   Overall Financial Resource Strain (CARDIA)    Difficulty of Paying Living Expenses: Not hard at all  Food Insecurity: No Food Insecurity (02/22/2024)   Hunger Vital Sign    Worried About Running Out of Food in the Last Year: Never true    Ran Out of Food in the Last Year: Never true  Transportation Needs: No Transportation Needs (02/22/2024)  PRAPARE - Administrator, Civil Service (Medical): No    Lack of Transportation (Non-Medical): No  Physical Activity: Insufficiently Active (02/22/2024)   Exercise Vital Sign    Days of Exercise per Week: 1 day    Minutes of Exercise per Session: 10 min  Stress: No Stress Concern Present (02/22/2024)   [employer] of Occupational Health - Occupational Stress Questionnaire    Feeling of Stress: Not at all  Social Connections: Socially Integrated (02/22/2024)   Social Connection and Isolation Panel    Frequency of Communication with Friends and Family: Three times a week    Frequency of Social Gatherings with Friends and Family: Once a week    Attends Religious Services: More than 4 times per year    Active Member of [employer] or Organizations: Yes    Attends Engineer, Structural: More than 4 times per year    Marital Status: Married  Catering Manager Violence: Not on file    Family History  Problem Relation Age of Onset   Hypertension Mother    Diabetes Mother    Breast cancer Mother    Diabetes Maternal Aunt    Breast cancer Maternal Aunt    Breast cancer Maternal Aunt    Esophageal cancer Maternal Uncle    Diabetes Maternal Uncle    COPD Neg Hx    Early death Neg Hx    Heart disease Neg Hx     Hyperlipidemia Neg Hx    Kidney disease Neg Hx    Stroke Neg Hx    Colon cancer Neg Hx    Rectal cancer Neg Hx    Stomach cancer Neg Hx     ROS: Knee arthralgias but no fevers or chills, productive cough, hemoptysis, dysphasia, odynophagia, melena, hematochezia, dysuria, hematuria, rash, seizure activity, orthopnea, PND, pedal edema, claudication. Remaining systems are negative.  Physical Exam: Well-developed obese in no acute distress.  Skin is warm and dry.  HEENT is normal.  Neck is supple.  Chest is clear to auscultation with normal expansion.  Cardiovascular exam is regular rate and rhythm.  Abdominal exam nontender or distended. No masses palpated. Extremities show no edema. neuro grossly intact  A/P  1 coronary calcification-patient denies chest pain.  Continue aspirin  and statin.  2 hyperlipidemia-LPa elevated previously.  Continue Repatha , Zetia  and Crestor .  3 hypertension-blood pressure is mildly elevated.  However she follows this at home and it is typically controlled.  Continue present medications and follow-up.  4 tobacco abuse-patient discontinued 9 months ago.  5 morbid obesity-we discussed importance of weight loss.  6 mildly dilated aortic root-Will arrange follow-up echocardiogram to reassess.  Redell Shallow, MD

## 2024-05-01 ENCOUNTER — Telehealth: Payer: Self-pay | Admitting: Gastroenterology

## 2024-05-01 NOTE — Telephone Encounter (Signed)
 PT is calling to find out if her procedure on 12/11 will be paid by her insurance. Please advise.

## 2024-05-02 ENCOUNTER — Other Ambulatory Visit: Payer: Self-pay | Admitting: Internal Medicine

## 2024-05-02 ENCOUNTER — Encounter: Payer: Self-pay | Admitting: Orthopaedic Surgery

## 2024-05-02 DIAGNOSIS — I10 Essential (primary) hypertension: Secondary | ICD-10-CM

## 2024-05-02 DIAGNOSIS — I2510 Atherosclerotic heart disease of native coronary artery without angina pectoris: Secondary | ICD-10-CM

## 2024-05-02 DIAGNOSIS — I110 Hypertensive heart disease with heart failure: Secondary | ICD-10-CM

## 2024-05-02 NOTE — Telephone Encounter (Signed)
 Talked to patient concerning gel injection.  Advised patient that the process does take about 2 weeks.

## 2024-05-07 ENCOUNTER — Encounter: Payer: Self-pay | Admitting: Podiatry

## 2024-05-07 ENCOUNTER — Ambulatory Visit: Admitting: Podiatry

## 2024-05-07 DIAGNOSIS — M79674 Pain in right toe(s): Secondary | ICD-10-CM | POA: Diagnosis not present

## 2024-05-07 DIAGNOSIS — M79675 Pain in left toe(s): Secondary | ICD-10-CM | POA: Diagnosis not present

## 2024-05-07 DIAGNOSIS — B351 Tinea unguium: Secondary | ICD-10-CM

## 2024-05-07 DIAGNOSIS — S92501A Displaced unspecified fracture of right lesser toe(s), initial encounter for closed fracture: Secondary | ICD-10-CM | POA: Diagnosis not present

## 2024-05-07 NOTE — Progress Notes (Signed)
  Subjective:  Patient ID: Lorraine Holt, female    DOB: June 06, 1966,  MRN: [id number]  Chief Complaint  Patient presents with   Diabetes    I hit my toe on my left foot, second to the pinkie toe a couple of months ago.  I got my nails trimmed down as much as I could.  Saw Dr. Debby Molt - 02/28/2024; A1c - 7.3    58 y.o. female presents with the above complaint. History confirmed with patient.  Skin tag is still on the right foot, has been present for a long time, over 20 years.  Does not particular bother her, when the skin gets thick and rough she clipped the back.  Occasionally bleeds after she does this but not on its own.  Has not changed colors.  Relatively unchanged since my exam with her.  Her nails are also thickened and causing discomfort.  She think she has nail fungus.  Her diabetes is relatively well-controlled as her A1c is up some to 7.3.  She also injured her right fourth toe about a month ago has not seen anyone for this issue  Objective:  Physical Exam: warm, good capillary refill, no trophic changes or ulcerative lesions, normal DP and PT pulses, and normal sensory exam. Left Foot: dystrophic yellowed discolored nail plates with subungual debris Right Foot: dystrophic yellowed discolored nail plates with subungual debris and submetatarsal 2 skin tag benign-appearing, unchanged, she has pain and swelling around the base of the fourth toe that is mild     Assessment:   1. Pain due to onychomycosis of toenails of both feet   2. Closed fracture of phalanx of right fourth toe, initial encounter      Plan:  Patient was evaluated and treated and all questions answered.  Patient educated on diabetes. Discussed proper diabetic foot care and discussed risks and complications of disease. Educated patient in depth on reasons to return to the office immediately should he/she discover anything concerning or new on the feet. All questions answered. Discussed proper shoes as well.    Discussed the etiology and treatment options for the condition in detail with the patient.  We discussed topical and oral treatment options for mycotic nails.  I am doubtful topical therapy would be helpful.  Recommended debridement of the nails today. Sharp and mechanical debridement performed of all painful and mycotic nails today. Nails debrided in length and thickness using a nail nipper to level of comfort. Discussed treatment options including appropriate shoe gear. Follow up as needed for painful nails.  Skin tag unchanged follow-up as needed for this continue to monitor for any changes  I discussed with her she may have a fracture of the fourth toe, she has good range of motion of the MTP and PIP joints with mild tenderness.  There is no deformity.  Deferred x-ray today as it has been 1 month since the injury, I do not think x-ray would change management at this point she is comfortable in regular shoe gear, I discussed with her if it is bothersome more than 3 months after the injury to follow-up with me for an x-ray  Return in about 3 months (around 08/05/2024) for at risk diabetic foot care.

## 2024-05-09 ENCOUNTER — Encounter: Payer: Self-pay | Admitting: Gastroenterology

## 2024-05-09 ENCOUNTER — Ambulatory Visit
Admission: RE | Admit: 2024-05-09 | Discharge: 2024-05-09 | Disposition: A | Discharge status: Home / Self Care | Source: Ambulatory Visit

## 2024-05-09 ENCOUNTER — Other Ambulatory Visit (HOSPITAL_COMMUNITY): Admission: RE | Admit: 2024-05-09 | Discharge: 2024-05-09 | Disposition: A | Source: Ambulatory Visit

## 2024-05-09 DIAGNOSIS — E041 Nontoxic single thyroid nodule: Secondary | ICD-10-CM

## 2024-05-10 ENCOUNTER — Ambulatory Visit: Admitting: Emergency Medicine

## 2024-05-10 ENCOUNTER — Encounter: Payer: Self-pay | Admitting: Emergency Medicine

## 2024-05-10 VITALS — BP 138/82 | HR 91 | Temp 97.8°F | Ht 70.5 in | Wt 320.2 lb

## 2024-05-10 DIAGNOSIS — R911 Solitary pulmonary nodule: Secondary | ICD-10-CM | POA: Diagnosis not present

## 2024-05-10 DIAGNOSIS — J431 Panlobular emphysema: Secondary | ICD-10-CM

## 2024-05-10 DIAGNOSIS — G4733 Obstructive sleep apnea (adult) (pediatric): Secondary | ICD-10-CM | POA: Diagnosis not present

## 2024-05-10 DIAGNOSIS — J449 Chronic obstructive pulmonary disease, unspecified: Secondary | ICD-10-CM

## 2024-05-10 MED ORDER — SPIRIVA RESPIMAT 2.5 MCG/ACT IN AERS: 2.0000 | INHALATION_SPRAY | Freq: Every day | RESPIRATORY_TRACT | Status: DC

## 2024-05-10 MED ORDER — SPIRIVA RESPIMAT 2.5 MCG/ACT IN AERS: 2.0000 | INHALATION_SPRAY | Freq: Every day | RESPIRATORY_TRACT | 0 refills | Status: AC

## 2024-05-10 NOTE — Progress Notes (Signed)
 Subjective:    Patient ID: Lorraine Holt, female    DOB: April 03, 1966, 58 y.o.   MRN: [id number]  HPI 58 year old former smoker (34 pack years) with a history of COPD/chronic bronchitis, diabetes, PVD, OSA.  She has been seen in our office in the past by Dr. Shellia.  I saw her in 11/2023 for navigational bronchoscopy to evaluate a suspicious right upper lobe pulmonary nodule that had changed slightly on serial imaging.  Her cytology showed macrophages and no evidence of malignancy.  Cultures were negative with the exception of few Prevotella (beta-lactamase positive)-she was treated with doxycycline  for this post procedure.  Repeat CT chest 02/23/2024 showed the existing nodule to be stable as below, new ground glass nodule in medial left upper lobe, new peripheral right lower lobe nodule. Today she reports that she does get some exertional SOB, does have some cough.  Not currently on BD. She has OSA and is working to get on CPAP w Dr Vear.   CT scan of the chest 02/23/2024 reviewed by me showed no mediastinal adenopathy, mild centrilobular and paraseptal emphysema, unchanged lateral right apical pulmonary cyst with mural thickening and ground glass density.  Interval new 6.8 mm nodule in the peripheral right lower lobe and ground glass nodule in the medial left upper lobe.   Review of Systems As per HPI  Past Medical History:  Diagnosis Date   Anemia    history age 52'S   Arthritis    [facility]reesia 02/24/2020   Bronchitis    Carpal tunnel syndrome of right wrist    COPD (chronic obstructive pulmonary disease) (HCC)    Diabetes mellitus without complication (HCC)    [facility]reesia 02/24/2020   Enlarged heart    per patient follow with cardiology   Hyperlipidemia    Hypertension    Obesity    Peripheral vascular disease    undiagnosis, but complains of numbness in legs   Shortness of breath dyspnea    with exertion   Sleep apnea    Phreesia 02/24/2020   Tooth abscess 09/30/2014   tooth was  extracted     Family History  Problem Relation Age of Onset   Hypertension Mother    Diabetes Mother    Breast cancer Mother    Diabetes Maternal Aunt    Breast cancer Maternal Aunt    Breast cancer Maternal Aunt    Esophageal cancer Maternal Uncle    Diabetes Maternal Uncle    COPD Neg Hx    Early death Neg Hx    Heart disease Neg Hx    Hyperlipidemia Neg Hx    Kidney disease Neg Hx    Stroke Neg Hx    Colon cancer Neg Hx    Rectal cancer Neg Hx    Stomach cancer Neg Hx      Social History   Socioeconomic History   Marital status: Married    Spouse name: Not on file   Number of children: 4   Years of education: Not on file   Highest education level: Some college, no degree  Occupational History   Occupation: [employer]  Tobacco Use   Smoking status: Former    Current packs/day: 1.00    Types: Cigarettes    Passive exposure: Past   Smokeless tobacco: Never   Tobacco comments:    Pt quit smoking the end of January 2025-08/23/2023    Pt. With a 32 pack year smoking history  Vaping Use   Vaping status: Not  on file  Substance and Sexual Activity   Alcohol  use: No   Drug use: No   Sexual activity: Never    Birth control/protection: Surgical  Other Topics Concern   Not on file  Social History Narrative   Pt lives husband    Pt works    Social Drivers of Corporate Investment Banker Strain: Low Risk  (02/22/2024)   Overall Financial Resource Strain (CARDIA)    Difficulty of Paying Living Expenses: Not hard at all  Food Insecurity: No Food Insecurity (02/22/2024)   Hunger Vital Sign    Worried About Running Out of Food in the Last Year: Never true    Ran Out of Food in the Last Year: Never true  Transportation Needs: No Transportation Needs (02/22/2024)   PRAPARE - Administrator, Civil Service (Medical): No    Lack of Transportation (Non-Medical): No  Physical Activity: Insufficiently Active (02/22/2024)   Exercise Vital Sign    Days of  Exercise per Week: 1 day    Minutes of Exercise per Session: 10 min  Stress: No Stress Concern Present (02/22/2024)   [employer] of Occupational Health - Occupational Stress Questionnaire    Feeling of Stress: Not at all  Social Connections: Socially Integrated (02/22/2024)   Social Connection and Isolation Panel    Frequency of Communication with Friends and Family: Three times a week    Frequency of Social Gatherings with Friends and Family: Once a week    Attends Religious Services: More than 4 times per year    Active Member of [employer] or Organizations: Yes    Attends Engineer, Structural: More than 4 times per year    Marital Status: Married  Catering Manager Violence: Not on file     Allergies  Allergen Reactions   Clindamycin /Lincomycin Swelling    Tongue swelled   Penicillins Swelling and Rash   Amlodipine      Constipation, dizziness   Jardiance  [Empagliflozin ] Other (See Comments)    Vaginal irritation     Outpatient Medications Prior to Visit  Medication Sig Dispense Refill   aspirin  EC 81 MG tablet Take 81 mg by mouth daily. Swallow whole.     Blood Glucose Monitoring Suppl DEVI 1 each by Does not apply route as directed. Dispense based on patient and insurance preference. Use up to four times daily as directed. (FOR ICD-10 E10.9, E11.9). 1 each 0   carvedilol  (COREG ) 3.125 MG tablet TAKE 1 TABLET BY MOUTH TWICE DAILY WITH A MEAL 180 tablet 0   docusate sodium  (COLACE) 50 MG capsule Take 50 mg by mouth daily.     Dulaglutide  (TRULICITY ) 1.5 MG/0.5ML SOAJ Inject 1.5 mg into the skin once a week. 6 mL 1   Evolocumab  (REPATHA  SURECLICK) 140 MG/ML SOAJ INJECT 140 MG INTO THE SKIN EVERY 14 DAYS 2 mL 11   ezetimibe  (ZETIA ) 10 MG tablet Take 1 tablet by mouth once daily 90 tablet 0   Glucose Blood (BLOOD GLUCOSE TEST STRIPS) STRP 1 each by Does not apply route as directed. Dispense based on patient and insurance preference. Use up to four times daily as directed.  (FOR ICD-10 E10.9, E11.9). 100 strip 0   ibuprofen  (ADVIL ) 200 MG tablet Take 400 mg by mouth 3 (three) times daily.     indapamide  (LOZOL ) 1.25 MG tablet Take 1 tablet (1.25 mg total) by mouth daily. 90 tablet 1   lamoTRIgine  (LAMICTAL ) 25 MG tablet Take 2 tablets by mouth twice daily  120 tablet 11   Lancet Device MISC 1 each by Does not apply route as directed. Dispense based on patient and insurance preference. Use up to four times daily as directed. (FOR ICD-10 E10.9, E11.9). 1 each 0   lubiprostone  (AMITIZA ) 24 MCG capsule Take 1 capsule (24 mcg total) by mouth 2 (two) times daily with a meal. 180 capsule 0   metFORMIN  (GLUCOPHAGE -XR) 750 MG 24 hr tablet Take 1 tablet by mouth once daily with breakfast 90 tablet 0   rosuvastatin  (CRESTOR ) 40 MG tablet Take 1 tablet (40 mg total) by mouth daily. 90 tablet 1   thiamine  (VITAMIN B-1) 50 MG tablet Take 1 tablet (50 mg total) by mouth daily. 90 tablet 1   Wheat Dextrin (BENEFIBER PO) Take by mouth.     JARDIANCE  25 MG TABS tablet Take 1 tablet by mouth once daily (Patient not taking: Reported on 05/10/2024) 90 tablet 0   Lancets MISC 1 each by Does not apply route as directed. Dispense based on patient and insurance preference. Use up to four times daily as directed. (FOR ICD-10 E10.9, E11.9). 100 each 0   TRUEPLUS LANCETS 30G MISC USE TWICE DAILY 100 each 11   No facility-administered medications prior to visit.        Objective:   Physical Exam  Vitals:   05/10/24 0959  BP: 138/82  Pulse: 91  Temp: 97.8 F (36.6 C)  TempSrc: Oral  SpO2: 99%  Weight: (!) 320 lb 3.2 oz (145.2 kg)  Height: 5' 10.5 (1.791 m)   Gen: Pleasant, well-nourished, in no distress,  normal affect  ENT: No lesions,  mouth clear,  oropharynx clear, no postnasal drip  Neck: No JVD, no stridor  Lungs: No use of accessory muscles, no crackles or wheezing on normal respiration, no wheeze on forced expiration  Cardiovascular: RRR, heart sounds normal, no  murmur or gallops, no peripheral edema  Musculoskeletal: No deformities, no cyanosis or clubbing  Neuro: alert, awake, non focal  Skin: Warm, no lesions or rash      Assessment & Plan:   Lung nodule seen on imaging study Biopsies of her right upper lobe nodule June 2025 were negative for malignancy and follow-up imaging 02/23/2024 overall stable but with a new 7 mm nodule the peripheral right lower lobe and a ground glass nodule to medial left upper lobe that will have to be followed also.  Her next scan will be mid December.  I will follow-up with her after to review and decide whether any further diagnostics are indicated.  OSA (obstructive sleep apnea) She is working on getting started on CPAP, is motivated to do so  Panlobular emphysema (HCC) We will arrange for pulmonary function testing We will do a trial of Spiriva Respimat.  Use 2 puffs once daily.  Keep track of how this medication helps you so we can discuss next time  I personally spent a total of 76 minutes in the care of the patient today including preparing to see the patient, getting/reviewing separately obtained history, performing a medically appropriate exam/evaluation, counseling and educating, placing orders, documenting clinical information in the EHR, independently interpreting results, and communicating results.   Lamar Chris, MD, PhD 05/10/2024, 3:12 PM [facility] [phone] or if no answer before 7:00PM call [phone] For any issues after 7:00PM please call eLink [phone]

## 2024-05-10 NOTE — Assessment & Plan Note (Signed)
 We will arrange for pulmonary function testing We will do a trial of Spiriva Respimat.  Use 2 puffs once daily.  Keep track of how this medication helps you so we can discuss next time

## 2024-05-10 NOTE — Assessment & Plan Note (Signed)
 Biopsies of her right upper lobe nodule June 2025 were negative for malignancy and follow-up imaging 02/23/2024 overall stable but with a new 7 mm nodule the peripheral right lower lobe and a ground glass nodule to medial left upper lobe that will have to be followed also.  Her next scan will be mid December.  I will follow-up with her after to review and decide whether any further diagnostics are indicated.

## 2024-05-10 NOTE — Assessment & Plan Note (Signed)
 She is working on getting started on CPAP, is motivated to do so

## 2024-05-10 NOTE — Patient Instructions (Addendum)
 We reviewed your bronchoscopy results and your most recent CT scan of the chest from 02/23/2024. Agree with a repeat CT scan of the chest later this month as planned. We will arrange for pulmonary function testing We will do a trial of Spiriva Respimat.  Use 2 puffs once daily.  Keep track of how this medication helps you so we can discuss next time Agree with getting on CPAP as planned by Dr Vear. Follow with Dr. Shelah next available after your CT and your breathing test so we can review those results together.

## 2024-05-11 ENCOUNTER — Ambulatory Visit
Admission: RE | Admit: 2024-05-11 | Discharge: 2024-05-11 | Disposition: A | Source: Ambulatory Visit | Attending: Cardiology | Admitting: Cardiology

## 2024-05-11 ENCOUNTER — Encounter: Payer: Self-pay | Admitting: Cardiology

## 2024-05-11 ENCOUNTER — Ambulatory Visit: Admitting: Orthopaedic Surgery

## 2024-05-11 ENCOUNTER — Ambulatory Visit: Admitting: Cardiology

## 2024-05-11 VITALS — BP 120/90 | HR 95 | Ht 70.0 in | Wt 318.8 lb

## 2024-05-11 DIAGNOSIS — I77810 Thoracic aortic ectasia: Secondary | ICD-10-CM

## 2024-05-11 DIAGNOSIS — I2510 Atherosclerotic heart disease of native coronary artery without angina pectoris: Secondary | ICD-10-CM | POA: Diagnosis not present

## 2024-05-11 DIAGNOSIS — M170 Bilateral primary osteoarthritis of knee: Secondary | ICD-10-CM | POA: Diagnosis not present

## 2024-05-11 DIAGNOSIS — I10 Essential (primary) hypertension: Secondary | ICD-10-CM | POA: Diagnosis not present

## 2024-05-11 DIAGNOSIS — E785 Hyperlipidemia, unspecified: Secondary | ICD-10-CM

## 2024-05-11 LAB — CYTOLOGY - NON PAP

## 2024-05-11 LAB — ECHOCARDIOGRAM COMPLETE
Area-P 1/2: 4.63 cm2
Height: 70 in
S' Lateral: 3.1 cm
Weight: 5100.8 [oz_av]

## 2024-05-11 MED ORDER — HYALURONAN 88 MG/4ML IX SOSY
88.0000 mg | PREFILLED_SYRINGE | INTRA_ARTICULAR | Status: AC | PRN
  Administered 2024-05-11: 88 mg via INTRA_ARTICULAR

## 2024-05-11 NOTE — Patient Instructions (Signed)
  Testing/Procedures:  Your physician has requested that you have an echocardiogram. Echocardiography is a painless test that uses sound waves to create images of your heart. It provides your doctor with information about the size and shape of your heart and how well your heart's chambers and valves are working. This procedure takes approximately one hour. There are no restrictions for this procedure. Please do NOT wear cologne, perfume, aftershave, or lotions (deodorant is allowed). Please arrive 15 minutes prior to your appointment time.  Please note: We ask at that you not bring children with you during ultrasound (echo/ vascular) testing. Due to room size and safety concerns, children are not allowed in the ultrasound rooms during exams. Our front office staff cannot provide observation of children in our lobby area while testing is being conducted. An adult accompanying a patient to their appointment will only be allowed in the ultrasound room at the discretion of the ultrasound technician under special circumstances. We apologize for any inconvenience. [address]  Follow-Up: At [facility], you and your health needs are our priority.  As part of our continuing mission to provide you with exceptional heart care, our providers are all part of one team.  This team includes your primary Cardiologist (physician) and Advanced Practice Providers or APPs (Physician Assistants and Nurse Practitioners) who all work together to provide you with the care you need, when you need it.  Your next appointment:   12 month(s)  Provider:   Alexandria Angel MD

## 2024-05-11 NOTE — Progress Notes (Signed)
   Procedure Note  Patient: Lorraine Holt             Date of Birth: [birth year]           MRN: [id number]             Visit Date: 05/11/2024  Procedures: Visit Diagnoses:  1. Bilateral primary osteoarthritis of knee     Large Joint Inj: bilateral knee on 05/11/2024 4:35 PM Indications: pain Details: 22 G needle  Arthrogram: No  Medications (Right): 88 mg Hyaluronan 88 MG/4ML Medications (Left): 88 mg Hyaluronan 88 MG/4ML Outcome: tolerated well, no immediate complications Patient was prepped and draped in the usual sterile fashion.

## 2024-05-12 ENCOUNTER — Ambulatory Visit: Payer: Self-pay | Admitting: Cardiology

## 2024-05-15 DIAGNOSIS — G4733 Obstructive sleep apnea (adult) (pediatric): Secondary | ICD-10-CM | POA: Diagnosis not present

## 2024-05-16 ENCOUNTER — Encounter (INDEPENDENT_AMBULATORY_CARE_PROVIDER_SITE_OTHER): Payer: Self-pay

## 2024-05-16 ENCOUNTER — Ambulatory Visit (INDEPENDENT_AMBULATORY_CARE_PROVIDER_SITE_OTHER): Admitting: Audiology

## 2024-05-16 ENCOUNTER — Ambulatory Visit (INDEPENDENT_AMBULATORY_CARE_PROVIDER_SITE_OTHER)

## 2024-05-16 VITALS — BP 137/84 | HR 87 | Temp 98.6°F

## 2024-05-16 DIAGNOSIS — H903 Sensorineural hearing loss, bilateral: Secondary | ICD-10-CM

## 2024-05-16 DIAGNOSIS — H6121 Impacted cerumen, right ear: Secondary | ICD-10-CM | POA: Diagnosis not present

## 2024-05-16 DIAGNOSIS — H608X3 Other otitis externa, bilateral: Secondary | ICD-10-CM

## 2024-05-16 DIAGNOSIS — E042 Nontoxic multinodular goiter: Secondary | ICD-10-CM

## 2024-05-16 MED ORDER — FLUOCINOLONE ACETONIDE 0.01 % OT OIL: 2.0000 [drp] | TOPICAL_OIL | Freq: Two times a day (BID) | OTIC | 2 refills | Status: AC

## 2024-05-16 NOTE — Progress Notes (Signed)
°  [address], [address] [place], [place] [address] [phone]  Audiological Evaluation    Name: Lorraine Holt     DOB:   1965-06-16      MRN:   [id number]                                                                                     Service Date: 05/16/2024     Accompanied by: self    Patient comes today after Dr. Greggory, ENT sent a referral for a hearing evaluation due to concerns with fluid in her ears.   Symptoms Yes Details  Hearing loss  []    Tinnitus  [x]  Sometimes both ears  Ear pain/ infections/pressure  []    Balance problems  []    Noise exposure history  [x]  Used to work around loud machines  Previous ear surgeries  [x]  Reported had surgery after perforation on the left ear  Family history of hearing loss  [x]  none  Amplification  []    Other  []      Otoscopy: Right ear: Clear external ear canal and notable landmarks visualized on the tympanic membrane. Left ear:  Clear external ear canal and notable landmarks visualized on the tympanic membrane.  Tympanometry: Right ear: Type A - Normal external ear canal volume with normal middle ear pressure and normal tympanic membrane compliance. Findings are consistent with normal middle ear function. Left ear: Type A - Normal external ear canal volume with normal middle ear pressure and normal tympanic membrane compliance. Findings are consistent with normal middle ear function.   Hearing Evaluation The hearing test results were completed under headphones and results are deemed to be of good reliability. Test technique:  conventional    Pure tone Audiometry: Right ear- Normal hearing from [phone] Hz, then mild to moderate sensorineural hearing loss from 4000 Hz - 8000 Hz. Left ear-  Normal hearing from [phone] Hz, then mild to moderate sensorineural hearing loss from 4000 Hz - 8000 Hz.  Speech Audiometry: Right ear- Speech Reception Threshold (SRT) was obtained at 5 dBHL. Left ear-Speech Reception Threshold  (SRT) was obtained at 10 dBHL.   Word Recognition Score Tested using NU-6 (recorded) Right ear: 100% was obtained at a presentation level of 60 dBHL with contralateral masking which is deemed as  excellent. Left ear: 100% was obtained at a presentation level of 60 dBHL with contralateral masking which is deemed as  excellent.   Impression: There is not a significant difference in pure-tone thresholds between ears., There is not a significant difference in the word recognition score in between ears.    Recommendations: Follow up with ENT as scheduled. Return for a hearing evaluation if concerns with hearing changes arise or per MD recommendation.   Camrynn Mcclintic MARIE LEROUX-MARTINEZ, AUD

## 2024-05-16 NOTE — Progress Notes (Signed)
 Dear Dr. Joshua, Here is my assessment for our mutual patient, Lorraine Holt. Thank you for allowing me the opportunity to care for your patient. Please do not hesitate to contact me should you have any other questions. Sincerely, Dr. Hadassah Parody  Otolaryngology Clinic Note Referring provider: Dr. Joshua HPI:   Initial HPI (04/09/24) Discussed the use of AI scribe software for clinical note transcription with the patient, who gave verbal consent to proceed.  Lorraine Holt is a 58 year old female who presents with a thyroid  nodule and right ear discomfort.  Thyroid  nodules and dysphagia - Recent ultrasound identified multiple thyroid  nodules, with one nodule requiring further evaluation by biopsy. - No significant compressive symptoms   Otic discomfort - Bilateral ear discomfort characterized by aching, intermittent pressure, and itching, worse on right  - Attempts to alleviate symptoms by cleaning her ears. - History of frequent earaches during childhood. - No recent ear infections  History of Present Illness  --------------------------------------------------------- 05/16/2024   Presents for f/u after thyroid  nodule biopsy and audiogram.    Thyroid  nodule and goiter - Nontoxic multinodular goiter with prior benign biopsy of thyroid  nodule - No recent changes in thyroid -related symptoms - No compressive symptoms including dysphagia or hoarseness  Bilateral otalgia and otitis externa - Persistent daily bilateral otalgia described as achiness - Intermittent aural fullness  - Persistent pruritus, particularly in one ear - Sensation like something growling in it prompting manipulation of ears - No prior use of otic drops    Independent Review of Additional Tests or Records:  Telephone note Debby Joshua, MD (03/11/24): ordered referral due to suspicious nodule on right thyroid   02/23/24 CT chest shows thyroid  nodule on right side  03/08/24 Thyroid  US  shows multinodular  goiter with TR3 nodule at right inferior lobe   TSH 08/11/23: 4.27  05/16/24 Audiogram was independently reviewed and interpreted by me and it reveals Right ear: Normal sloping to mild sensorineural hearing loss; 96% word interpretation at 60 dB; type A tympanogram Left ear: Normal sloping to mild sensorineural hearing loss; 100% word interpretation at 60 dB; type A tympanogram    Thyroid  FNA 05/09/2024 and reviewed showing benign follicular nodule (Bethesda category 2)  PMH/Meds/All/SocHx/FamHx/ROS:   Past Medical History:  Diagnosis Date   Anemia    history age 75'S   Arthritis    Phreesia 02/24/2020   Bronchitis    Carpal tunnel syndrome of right wrist    COPD (chronic obstructive pulmonary disease) (HCC)    Diabetes mellitus without complication (HCC)    Phreesia 02/24/2020   Enlarged heart    per patient follow with cardiology   Hyperlipidemia    Hypertension    Obesity    Peripheral vascular disease    undiagnosis, but complains of numbness in legs   Shortness of breath dyspnea    with exertion   Sleep apnea    Phreesia 02/24/2020   Tooth abscess 09/30/2014   tooth was extracted     Past Surgical History:  Procedure Laterality Date   ABDOMINAL HYSTERECTOMY  09/2015   BILATERAL SALPINGECTOMY Right 09/17/2014   Procedure: RIGHT SALPINGECTOMY;  Surgeon: Elveria Mungo, MD;  Location: [facility];  Service: Gynecology;  Laterality: Right;   BREAST BIOPSY Left 03/2014   BRONCHIAL BIOPSY  11/15/2023   Procedure: BRONCHOSCOPY, WITH BIOPSY;  Surgeon: Shelah Lamar RAMAN, MD;  Location: [facility] ENDOSCOPY;  Service: Pulmonary;;   BRONCHIAL BRUSHINGS  11/15/2023   Procedure: BRONCHOSCOPY, WITH BRUSH BIOPSY;  Surgeon: Shelah Lamar RAMAN, MD;  Location: [facility] ENDOSCOPY;  Service: Pulmonary;;   BRONCHIAL NEEDLE ASPIRATION BIOPSY  11/15/2023   Procedure: BRONCHOSCOPY, WITH NEEDLE ASPIRATION BIOPSY;  Surgeon: Shelah Lamar RAMAN, MD;  Location: [facility] ENDOSCOPY;  Service: Pulmonary;;   BRONCHIAL  WASHINGS  11/15/2023   Procedure: IRRIGATION, BRONCHUS;  Surgeon: Shelah Lamar RAMAN, MD;  Location: [facility] ENDOSCOPY;  Service: Pulmonary;;   BRONCHOSCOPY, WITH BIOPSY USING ELECTROMAGNETIC NAVIGATION Right 11/15/2023   Procedure: BRONCHOSCOPY, WITH BIOPSY USING ELECTROMAGNETIC NAVIGATION;  Surgeon: Shelah Lamar RAMAN, MD;  Location: [facility] ENDOSCOPY;  Service: Pulmonary;  Laterality: Right;   CHOLECYSTECTOMY Left 09/23/2023   Procedure: LAPAROSCOPIC CHOLECYSTECTOMY;  Surgeon: Tanda Locus, MD;  Location: [facility];  Service: General;  Laterality: Left;   COLONOSCOPY  04/2014   DILATION AND CURETTAGE OF UTERUS     FIDUCIAL MARKER PLACEMENT  11/15/2023   Procedure: INSERTION, FIDUCIAL MARKERS;  Surgeon: Shelah Lamar RAMAN, MD;  Location: [facility] ENDOSCOPY;  Service: Pulmonary;;   GALLBLADDER SURGERY  10/20/2023   ROBOTIC ASSISTED TOTAL HYSTERECTOMY N/A 09/17/2014   Procedure: ROBOTIC ASSISTED TOTAL HYSTERECTOMY;  Surgeon: Elveria Mungo, MD;  Location: [facility];  Service: Gynecology;  Laterality: N/A;   TOOTH EXTRACTION  09/30/2014   TUBAL LIGATION      Family History  Problem Relation Age of Onset   Hypertension Mother    Diabetes Mother    Breast cancer Mother    Diabetes Maternal Aunt    Breast cancer Maternal Aunt    Breast cancer Maternal Aunt    Esophageal cancer Maternal Uncle    Diabetes Maternal Uncle    COPD Neg Hx    Early death Neg Hx    Heart disease Neg Hx    Hyperlipidemia Neg Hx    Kidney disease Neg Hx    Stroke Neg Hx    Colon cancer Neg Hx    Rectal cancer Neg Hx    Stomach cancer Neg Hx      Social Connections: Socially Integrated (02/22/2024)   Social Connection and Isolation Panel    Frequency of Communication with Friends and Family: Three times a week    Frequency of Social Gatherings with Friends and Family: Once a week    Attends Religious Services: More than 4 times per year    Active Member of [employer] or Organizations: Yes    Attends Engineer, Structural:  More than 4 times per year    Marital Status: Married     Current Outpatient Medications  Medication Instructions   acetaminophen  (TYLENOL ) 500 mg, 2 times daily   aspirin  EC 81 mg, Daily   Blood Glucose Monitoring Suppl DEVI 1 each, Does not apply, As directed, Dispense based on patient and insurance preference. Use up to four times daily as directed. (FOR ICD-10 E10.9, E11.9).   carvedilol  (COREG ) 3.125 mg, Oral, 2 times daily with meals   Evolocumab  (REPATHA  SURECLICK) 140 MG/ML SOAJ INJECT 140 MG INTO THE SKIN EVERY 14 DAYS   ezetimibe  (ZETIA ) 10 mg, Oral, Daily   Fluocinolone Acetonide (DERMOTIC) 0.01 % OIL 2 drops, OTIC (EAR), 2 times daily   Glucose Blood (BLOOD GLUCOSE TEST STRIPS) STRP 1 each, Does not apply, As directed, Dispense based on patient and insurance preference. Use up to four times daily as directed. (FOR ICD-10 E10.9, E11.9).   indapamide  (LOZOL ) 1.25 mg, Oral, Daily   lamoTRIgine  (LAMICTAL ) 50 mg, Oral, 2 times daily   Lancet Device MISC 1 each, Does not apply, As directed, Dispense based on patient and insurance preference. Use up  to four times daily as directed. (FOR ICD-10 E10.9, E11.9).   Lancets MISC 1 each, Does not apply, As directed, Dispense based on patient and insurance preference. Use up to four times daily as directed. (FOR ICD-10 E10.9, E11.9).   lubiprostone  (AMITIZA ) 24 mcg, Oral, 2 times daily with meals   metFORMIN  (GLUCOPHAGE -XR) 750 mg, Oral, Daily with breakfast   rosuvastatin  (CRESTOR ) 40 mg, Oral, Daily   thiamine  (VITAMIN B-1) 50 mg, Oral, Daily   Tiotropium Bromide (SPIRIVA  RESPIMAT) 2.5 MCG/ACT AERS 2 puffs, Inhalation, Daily   TRUEPLUS LANCETS 30G MISC USE TWICE DAILY   Trulicity  1.5 mg, Subcutaneous, Weekly   Wheat Dextrin (BENEFIBER PO) Take by mouth.     Physical Exam:   BP 137/84 (BP Location: Left Arm, Patient Position: Sitting)   Pulse 87   Temp 98.6 F (37 C)   LMP 08/08/2014   SpO2 93%   Salient findings:  CN II-XII  intact Given history and complaints, ear microscopy was indicated and performed for evaluation with findings as below in physical exam section and in procedures Left: EAC was patent. TM was intact .  Middle ear appears well aerated.  Mild otitis externa present Right: EAC was impacted. TM was intact .  Middle ear appears well aerated.  Mild otitis externa present No lesions of oral cavity/oropharynx No obviously palpable neck masses/lymphadenopathy Right thyroid  palpable  No respiratory distress or stridor  Seprately Identifiable Procedures:  Prior to initiating any procedures, risks/benefits/alternatives were explained to the patient and verbal consent obtained.  Procedure (05/16/2024): Bilateral ear microscopy and cerumen removal using microscope (CPT [phone]) - Mod 25- right Pre-procedure diagnosis: right Cerumen impaction  Post-procedure diagnosis: same Indication: right cerumen impaction; given patient's otologic complaints and history as well as for improved and comprehensive examination of external ear and tympanic membrane, bilateral otologic examination using microscope was performed and impacted cerumen removed  Procedure: Patient was placed semi-recumbent. Both ear canals were examined using the microscope with findings above. Cerumen removed on right using suction and currette with improvement in EAC examination and patency.   Impression & Plans:  Tasfia Vasseur is a 58 y.o. female with   1. Multinodular goiter   2. Bilateral sensorineural hearing loss   3. Chronic eczematous otitis externa of both ears    Assessment and Plan Assessment & Plan Nontoxic multinodular goiter - TR4 nodule was biopsied and showed benign follicular nodule - Ordered thyroid  ultrasound in one year for surveillance. - Advised her to report any interval changes in the nodule or new symptoms prior to the scheduled ultrasound.  Chronic bilateral otalgia and pruritus, likely secondary to mild otitis  externa She experiences persistent bilateral otalgia and pruritus, likely due to mild otitis externa exacerbated by frequent manipulation and dryness. Otoscopic examination revealed no middle ear effusion. - Prescribed otic drops for pruritus and mild otitis externa. - Advised her to avoid digital manipulation of the external auditory canals.  Bilateral sensorineural hearing loss - Continue observation for now.  Can consider hearing aids in the future if she would like.    See below regarding exact medications prescribed this encounter including dosages and route: Meds ordered this encounter  Medications   Fluocinolone Acetonide (DERMOTIC) 0.01 % OIL    Sig: Place 2 drops in ear(s) in the morning and at bedtime for 10 days.    Dispense:  20 mL    Refill:  2    Thank you for allowing me the opportunity to care for your patient. Please  do not hesitate to contact me should you have any other questions.  Sincerely, Hadassah Parody, MD Otolaryngologist (ENT), [facility] Phone: [phone] Fax: [phone]  MDM:  Level 4 Complexity/Problems addressed: 4 -2 stable chronic problems Data complexity: 4-independent review of audiogram, review of FNA results - Morbidity: 4-prescription drug management - Prescription Drug prescribed or managed: Yes

## 2024-05-17 ENCOUNTER — Ambulatory Visit: Admitting: Gastroenterology

## 2024-05-17 ENCOUNTER — Encounter: Payer: Self-pay | Admitting: Gastroenterology

## 2024-05-17 VITALS — BP 148/93 | HR 93 | Temp 97.3°F | Resp 20 | Ht 70.0 in | Wt 312.0 lb

## 2024-05-17 DIAGNOSIS — D124 Benign neoplasm of descending colon: Secondary | ICD-10-CM | POA: Diagnosis not present

## 2024-05-17 DIAGNOSIS — Z1211 Encounter for screening for malignant neoplasm of colon: Secondary | ICD-10-CM | POA: Diagnosis not present

## 2024-05-17 DIAGNOSIS — E669 Obesity, unspecified: Secondary | ICD-10-CM | POA: Diagnosis not present

## 2024-05-17 DIAGNOSIS — D125 Benign neoplasm of sigmoid colon: Secondary | ICD-10-CM

## 2024-05-17 DIAGNOSIS — E119 Type 2 diabetes mellitus without complications: Secondary | ICD-10-CM | POA: Diagnosis not present

## 2024-05-17 DIAGNOSIS — K5730 Diverticulosis of large intestine without perforation or abscess without bleeding: Secondary | ICD-10-CM

## 2024-05-17 DIAGNOSIS — I10 Essential (primary) hypertension: Secondary | ICD-10-CM | POA: Diagnosis not present

## 2024-05-17 DIAGNOSIS — J449 Chronic obstructive pulmonary disease, unspecified: Secondary | ICD-10-CM | POA: Diagnosis not present

## 2024-05-17 MED ORDER — SODIUM CHLORIDE 0.9 % IV SOLN: 500.0000 mL | INTRAVENOUS | Status: DC

## 2024-05-17 NOTE — Progress Notes (Signed)
 [facility] History and Physical   Primary Care Physician:  Joshua Debby CROME, MD   Reason for Procedure:   Colon cancer screening  Plan:    Screening colonoscopy   HPI: Lorraine Holt is a 58 y.o. female undergoing initial average risk screening colonoscopy.  He has no family history of colon cancer and no chronic GI symptoms.    The patient was provided an opportunity to ask questions and all were answered. The patient agreed with the plan   Past Medical History:  Diagnosis Date   Anemia    history age 20'S   Arthritis    Phreesia 02/24/2020   Bronchitis    Carpal tunnel syndrome of right wrist    COPD (chronic obstructive pulmonary disease) (HCC)    Diabetes mellitus without complication (HCC)    Phreesia 02/24/2020   Enlarged heart    per patient follow with cardiology   Hyperlipidemia    Hypertension    Obesity    Peripheral vascular disease    undiagnosis, but complains of numbness in legs   Shortness of breath dyspnea    with exertion   Sleep apnea    Phreesia 02/24/2020   Tooth abscess 09/30/2014   tooth was extracted    Past Surgical History:  Procedure Laterality Date   ABDOMINAL HYSTERECTOMY  09/2015   BILATERAL SALPINGECTOMY Right 09/17/2014   Procedure: RIGHT SALPINGECTOMY;  Surgeon: Elveria Mungo, MD;  Location: [facility];  Service: Gynecology;  Laterality: Right;   BREAST BIOPSY Left 03/2014   BRONCHIAL BIOPSY  11/15/2023   Procedure: BRONCHOSCOPY, WITH BIOPSY;  Surgeon: Shelah Lamar RAMAN, MD;  Location: [facility] ENDOSCOPY;  Service: Pulmonary;;   BRONCHIAL BRUSHINGS  11/15/2023   Procedure: BRONCHOSCOPY, WITH BRUSH BIOPSY;  Surgeon: Shelah Lamar RAMAN, MD;  Location: [facility] ENDOSCOPY;  Service: Pulmonary;;   BRONCHIAL NEEDLE ASPIRATION BIOPSY  11/15/2023   Procedure: BRONCHOSCOPY, WITH NEEDLE ASPIRATION BIOPSY;  Surgeon: Shelah Lamar RAMAN, MD;  Location: [facility] ENDOSCOPY;  Service: Pulmonary;;   BRONCHIAL WASHINGS  11/15/2023   Procedure: IRRIGATION,  BRONCHUS;  Surgeon: Shelah Lamar RAMAN, MD;  Location: [facility]OSCOPY;  Service: Pulmonary;;   BRONCHOSCOPY, WITH BIOPSY USING ELECTROMAGNETIC NAVIGATION Right 11/15/2023   Procedure: BRONCHOSCOPY, WITH BIOPSY USING ELECTROMAGNETIC NAVIGATION;  Surgeon: Shelah Lamar RAMAN, MD;  Location: [facility]OSCOPY;  Service: Pulmonary;  Laterality: Right;   CHOLECYSTECTOMY Left 09/23/2023   Procedure: LAPAROSCOPIC CHOLECYSTECTOMY;  Surgeon: Tanda Locus, MD;  Location: [facility];  Service: General;  Laterality: Left;   COLONOSCOPY  04/2014   DILATION AND CURETTAGE OF UTERUS     FIDUCIAL MARKER PLACEMENT  11/15/2023   Procedure: INSERTION, FIDUCIAL MARKERS;  Surgeon: Shelah Lamar RAMAN, MD;  Location: [facility] ENDOSCOPY;  Service: Pulmonary;;   GALLBLADDER SURGERY  10/20/2023   ROBOTIC ASSISTED TOTAL HYSTERECTOMY N/A 09/17/2014   Procedure: ROBOTIC ASSISTED TOTAL HYSTERECTOMY;  Surgeon: Elveria Mungo, MD;  Location: [facility];  Service: Gynecology;  Laterality: N/A;   TOOTH EXTRACTION  09/30/2014   TUBAL LIGATION      Prior to Admission medications  Medication Sig Start Date End Date Taking? Authorizing Provider  acetaminophen  (TYLENOL ) 500 MG tablet Take 500 mg by mouth 2 (two) times daily.   Yes [provider]  aspirin  EC 81 MG tablet Take 81 mg by mouth daily. Swallow whole.   Yes [provider]  Blood Glucose Monitoring Suppl DEVI 1 each by Does not apply route as directed. Dispense based on patient and insurance preference. Use up to four times daily  as directed. (FOR ICD-10 E10.9, E11.9). 04/27/24  Yes Joshua Debby CROME, MD  carvedilol  (COREG ) 3.125 MG tablet TAKE 1 TABLET BY MOUTH TWICE DAILY WITH A MEAL 05/07/24  Yes Joshua Debby CROME, MD  ezetimibe  (ZETIA ) 10 MG tablet Take 1 tablet by mouth once daily 04/04/24  Yes Crenshaw, Redell RAMAN, MD  Glucose Blood (BLOOD GLUCOSE TEST STRIPS) STRP 1 each by Does not apply route as directed. Dispense based on patient and insurance preference. Use up to four  times daily as directed. (FOR ICD-10 E10.9, E11.9). 04/27/24  Yes Joshua Debby CROME, MD  indapamide  (LOZOL ) 1.25 MG tablet Take 1 tablet (1.25 mg total) by mouth daily. 01/26/24  Yes Joshua Debby CROME, MD  lamoTRIgine  (LAMICTAL ) 25 MG tablet Take 2 tablets by mouth twice daily 11/15/23  Yes Sater, Charlie LABOR, MD  Lancet Device MISC 1 each by Does not apply route as directed. Dispense based on patient and insurance preference. Use up to four times daily as directed. (FOR ICD-10 E10.9, E11.9). 04/27/24  Yes Joshua Debby CROME, MD  Lancets MISC 1 each by Does not apply route as directed. Dispense based on patient and insurance preference. Use up to four times daily as directed. (FOR ICD-10 E10.9, E11.9). 04/27/24  Yes Joshua Debby CROME, MD  metFORMIN  (GLUCOPHAGE -XR) 750 MG 24 hr tablet Take 1 tablet by mouth once daily with breakfast 04/06/24  Yes Joshua Debby CROME, MD  rosuvastatin  (CRESTOR ) 40 MG tablet Take 1 tablet (40 mg total) by mouth daily. 03/08/24 06/06/24 Yes Joshua Debby CROME, MD  thiamine  (VITAMIN B-1) 50 MG tablet Take 1 tablet (50 mg total) by mouth daily. 12/20/21  Yes Joshua Debby CROME, MD  Tiotropium Bromide (SPIRIVA  RESPIMAT) 2.5 MCG/ACT AERS Inhale 2 puffs into the lungs daily. 05/10/24  Yes Shelah Lamar RAMAN, MD  TRUEPLUS LANCETS 30G MISC USE TWICE DAILY 04/18/17  Yes Joshua Debby CROME, MD  Dulaglutide  (TRULICITY ) 1.5 MG/0.5ML SOAJ Inject 1.5 mg into the skin once a week. 03/08/24   Joshua Debby CROME, MD  Evolocumab  (REPATHA  SURECLICK) 140 MG/ML SOAJ INJECT 140 MG INTO THE SKIN EVERY 14 DAYS 10/07/23   Pietro Redell RAMAN, MD  Fluocinolone Acetonide (DERMOTIC) 0.01 % OIL Place 2 drops in ear(s) in the morning and at bedtime for 10 days. 05/16/24 05/26/24  Masciello, Hadassah BROCKS, MD  lubiprostone  (AMITIZA ) 24 MCG capsule Take 1 capsule (24 mcg total) by mouth 2 (two) times daily with a meal. 03/08/24   Joshua Debby CROME, MD  Wheat Dextrin (BENEFIBER PO) Take by mouth.    [provider]    Current Outpatient  Medications  Medication Sig Dispense Refill   acetaminophen  (TYLENOL ) 500 MG tablet Take 500 mg by mouth 2 (two) times daily.     aspirin  EC 81 MG tablet Take 81 mg by mouth daily. Swallow whole.     Blood Glucose Monitoring Suppl DEVI 1 each by Does not apply route as directed. Dispense based on patient and insurance preference. Use up to four times daily as directed. (FOR ICD-10 E10.9, E11.9). 1 each 0   carvedilol  (COREG ) 3.125 MG tablet TAKE 1 TABLET BY MOUTH TWICE DAILY WITH A MEAL 180 tablet 0   ezetimibe  (ZETIA ) 10 MG tablet Take 1 tablet by mouth once daily 90 tablet 0   Glucose Blood (BLOOD GLUCOSE TEST STRIPS) STRP 1 each by Does not apply route as directed. Dispense based on patient and insurance preference. Use up to four times daily as directed. (FOR ICD-10 E10.9, E11.9). 100  strip 0   indapamide  (LOZOL ) 1.25 MG tablet Take 1 tablet (1.25 mg total) by mouth daily. 90 tablet 1   lamoTRIgine  (LAMICTAL ) 25 MG tablet Take 2 tablets by mouth twice daily 120 tablet 11   Lancet Device MISC 1 each by Does not apply route as directed. Dispense based on patient and insurance preference. Use up to four times daily as directed. (FOR ICD-10 E10.9, E11.9). 1 each 0   Lancets MISC 1 each by Does not apply route as directed. Dispense based on patient and insurance preference. Use up to four times daily as directed. (FOR ICD-10 E10.9, E11.9). 100 each 0   metFORMIN  (GLUCOPHAGE -XR) 750 MG 24 hr tablet Take 1 tablet by mouth once daily with breakfast 90 tablet 0   rosuvastatin  (CRESTOR ) 40 MG tablet Take 1 tablet (40 mg total) by mouth daily. 90 tablet 1   thiamine  (VITAMIN B-1) 50 MG tablet Take 1 tablet (50 mg total) by mouth daily. 90 tablet 1   Tiotropium Bromide (SPIRIVA  RESPIMAT) 2.5 MCG/ACT AERS Inhale 2 puffs into the lungs daily. 4 g 0   TRUEPLUS LANCETS 30G MISC USE TWICE DAILY 100 each 11   Dulaglutide  (TRULICITY ) 1.5 MG/0.5ML SOAJ Inject 1.5 mg into the skin once a week. 6 mL 1   Evolocumab   (REPATHA  SURECLICK) 140 MG/ML SOAJ INJECT 140 MG INTO THE SKIN EVERY 14 DAYS 2 mL 11   Fluocinolone Acetonide (DERMOTIC) 0.01 % OIL Place 2 drops in ear(s) in the morning and at bedtime for 10 days. 20 mL 2   lubiprostone  (AMITIZA ) 24 MCG capsule Take 1 capsule (24 mcg total) by mouth 2 (two) times daily with a meal. 180 capsule 0   Wheat Dextrin (BENEFIBER PO) Take by mouth.     Current Facility-Administered Medications  Medication Dose Route Frequency Provider Last Rate Last Admin   0.9 %  sodium chloride  infusion  500 mL Intravenous Continuous Stacia Glendia BRAVO, MD        Allergies as of 05/17/2024 - Review Complete 05/17/2024  Allergen Reaction Noted   Clindamycin /lincomycin Swelling 12/19/2017   Penicillins Swelling and Rash 01/12/2011   Amlodipine   03/08/2012   Jardiance  [empagliflozin ] Other (See Comments) 04/26/2024    Family History  Problem Relation Age of Onset   Hypertension Mother    Diabetes Mother    Breast cancer Mother    Diabetes Maternal Aunt    Breast cancer Maternal Aunt    Breast cancer Maternal Aunt    Esophageal cancer Maternal Uncle    Diabetes Maternal Uncle    COPD Neg Hx    Early death Neg Hx    Heart disease Neg Hx    Hyperlipidemia Neg Hx    Kidney disease Neg Hx    Stroke Neg Hx    Colon cancer Neg Hx    Rectal cancer Neg Hx    Stomach cancer Neg Hx     Social History   Socioeconomic History   Marital status: Married    Spouse name: Not on file   Number of children: 4   Years of education: Not on file   Highest education level: Some college, no degree  Occupational History   Occupation: [employer]  Tobacco Use   Smoking status: Former    Current packs/day: 1.00    Types: Cigarettes    Passive exposure: Past   Smokeless tobacco: Never   Tobacco comments:    Pt quit smoking the end of January 2025-08/23/2023    Pt.  With a 32 pack year smoking history  Vaping Use   Vaping status: Never Used  Substance and Sexual  Activity   Alcohol  use: No   Drug use: No   Sexual activity: Never    Birth control/protection: Surgical  Other Topics Concern   Not on file  Social History Narrative   Pt lives husband    Pt works    Social Drivers of Health   Tobacco Use: Medium Risk (05/17/2024)   Patient History    Smoking Tobacco Use: Former    Smokeless Tobacco Use: Never    Passive Exposure: Past  Physicist, Medical Strain: Low Risk (02/22/2024)   Overall Financial Resource Strain (CARDIA)    Difficulty of Paying Living Expenses: Not hard at all  Food Insecurity: No Food Insecurity (02/22/2024)   Epic    Worried About Programme Researcher, Broadcasting/film/video in the Last Year: Never true    Ran Out of Food in the Last Year: Never true  Transportation Needs: No Transportation Needs (02/22/2024)   Epic    Lack of Transportation (Medical): No    Lack of Transportation (Non-Medical): No  Physical Activity: Insufficiently Active (02/22/2024)   Exercise Vital Sign    Days of Exercise per Week: 1 day    Minutes of Exercise per Session: 10 min  Stress: No Stress Concern Present (02/22/2024)   [employer] of Occupational Health - Occupational Stress Questionnaire    Feeling of Stress: Not at all  Social Connections: Socially Integrated (02/22/2024)   Social Connection and Isolation Panel    Frequency of Communication with Friends and Family: Three times a week    Frequency of Social Gatherings with Friends and Family: Once a week    Attends Religious Services: More than 4 times per year    Active Member of [employer] or Organizations: Yes    Attends Banker Meetings: More than 4 times per year    Marital Status: Married  Catering Manager Violence: Not on file  Depression (PHQ2-9): Low Risk (08/11/2023)   Depression (PHQ2-9)    PHQ-2 Score: 0  Alcohol  Screen: Not on file  Housing: Low Risk (02/22/2024)   Epic    Unable to Pay for Housing in the Last Year: No    Number of Times Moved in the Last Year: 0    Homeless in  the Last Year: No  Utilities: Not on file  Health Literacy: Not on file    Review of Systems:  All other review of systems negative except as mentioned in the HPI.  Physical Exam: Vital signs BP (!) 150/90   Pulse 98   Temp (!) 97.3 F (36.3 C) (Temporal)   Ht 5' 10 (1.778 m)   Wt (!) 312 lb (141.5 kg)   LMP 08/08/2014   SpO2 96%   BMI 44.77 kg/m   General:   Alert,  Well-developed, well-nourished, pleasant and cooperative in NAD Airway:  Mallampati 2 Lungs:  Clear throughout to auscultation.   Heart:  Regular rate and rhythm; no murmurs, clicks, rubs,  or gallops. Abdomen:  Soft, nontender and nondistended. Normal bowel sounds.   Neuro/Psych:  Normal mood and affect. A and O x 3   Emyah Roznowski E. Stacia, MD [facility]

## 2024-05-17 NOTE — Progress Notes (Signed)
 Sedate, gd SR, tolerated procedure well, VSS, report to RN

## 2024-05-17 NOTE — Progress Notes (Signed)
 Called to room to assist during endoscopic procedure.  Patient ID and intended procedure confirmed with present staff. Received instructions for my participation in the procedure from the performing physician.

## 2024-05-17 NOTE — Patient Instructions (Signed)
 Resume previous diet  Continue present medications  Await pathology results  Repeat colonoscopy based on results  See handouts on diverticulosis and polyps  YOU HAD AN ENDOSCOPIC PROCEDURE TODAY AT THE [facility]:   Refer to the procedure report that was given to you for any specific questions about what was found during the examination.  If the procedure report does not answer your questions, please call your gastroenterologist to clarify.  If you requested that your care partner not be given the details of your procedure findings, then the procedure report has been included in a sealed envelope for you to review at your convenience later.  YOU SHOULD EXPECT: Some feelings of bloating in the abdomen. Passage of more gas than usual.  Walking can help get rid of the air that was put into your GI tract during the procedure and reduce the bloating. If you had a lower endoscopy (such as a colonoscopy or flexible sigmoidoscopy) you may notice spotting of blood in your stool or on the toilet paper. If you underwent a bowel prep for your procedure, you may not have a normal bowel movement for a few days.  Please Note:  You might notice some irritation and congestion in your nose or some drainage.  This is from the oxygen used during your procedure.  There is no need for concern and it should clear up in a day or so.  SYMPTOMS TO REPORT IMMEDIATELY: Following lower endoscopy (colonoscopy or flexible sigmoidoscopy):  Excessive amounts of blood in the stool  Significant tenderness or worsening of abdominal pains  Swelling of the abdomen that is new, acute  Fever of 100F or higher  For urgent or emergent issues, a gastroenterologist can be reached at any hour by calling [phone]. Do not use MyChart messaging for urgent concerns.   DIET:  We do recommend a small meal at first, but then you may proceed to your regular diet.  Drink plenty of fluids but you should avoid alcoholic beverages  for 24 hours.  ACTIVITY:  You should plan to take it easy for the rest of today and you should NOT DRIVE or use heavy machinery until tomorrow (because of the sedation medicines used during the test).    FOLLOW UP: Our staff will call the number listed on your records the next business day following your procedure.  We will call around 7:15- 8:00 am to check on you and address any questions or concerns that you may have regarding the information given to you following your procedure. If we do not reach you, we will leave a message.     If any biopsies were taken you will be contacted by phone or by letter within the next 1-3 weeks.  Please call us  at [phone] if you have not heard about the biopsies in 3 weeks.   SIGNATURES/CONFIDENTIALITY: You and/or your care partner have signed paperwork which will be entered into your electronic medical record.  These signatures attest to the fact that that the information above on your After Visit Summary has been reviewed and is understood.  Full responsibility of the confidentiality of this discharge information lies with you and/or your care-partner.

## 2024-05-17 NOTE — Op Note (Signed)
 [facility] Patient Name: Francisca Langenderfer Procedure Date: 05/17/2024 11:40 AM MRN: [id number] Endoscopist: Glendia E. Stacia , MD, [id number] Age: 58 Referring MD:  Date of Birth: September 16, 1965 Gender: Female Account #: [id number] Procedure:                Colonoscopy Indications:              Screening for colorectal malignant neoplasm (last                            colonoscopy was 10 years ago) Medicines:                Monitored Anesthesia Care Procedure:                Pre-Anesthesia Assessment:                           - Prior to the procedure, a History and Physical                            was performed, and patient medications and                            allergies were reviewed. The patient's tolerance of                            previous anesthesia was also reviewed. The risks                            and benefits of the procedure and the sedation                            options and risks were discussed with the patient.                            All questions were answered, and informed consent                            was obtained. Prior Anticoagulants: The patient has                            taken no anticoagulant or antiplatelet agents. ASA                            Grade Assessment: III - A patient with severe                            systemic disease. After reviewing the risks and                            benefits, the patient was deemed in satisfactory                            condition to undergo the procedure.  After obtaining informed consent, the colonoscope                            was passed under direct vision. Throughout the                            procedure, the patient's blood pressure, pulse, and                            oxygen saturations were monitored continuously. The                            [id number]-HQ190L Colonoscope [id number] was introduced                            through the anus and  advanced to the the cecum,                            identified by appendiceal orifice and ileocecal                            valve. The colonoscopy was performed without                            difficulty. The patient tolerated the procedure                            well. The quality of the bowel preparation was                            excellent. The ileocecal valve, appendiceal                            orifice, and rectum were photographed. The bowel                            preparation used was SUPREP via extended prep with                            split dose instruction. Scope In: 11:53:11 AM Scope Out: 12:05:00 PM Scope Withdrawal Time: 0 hours 7 minutes 41 seconds  Total Procedure Duration: 0 hours 11 minutes 49 seconds  Findings:                 The perianal and digital rectal examinations were                            normal. Pertinent negatives include normal                            sphincter tone and no palpable rectal lesions.                           A 6 mm polyp was found in the descending colon. The  polyp was pedunculated. The polyp was removed with                            a cold snare. Resection and retrieval were                            complete. Estimated blood loss was minimal.                           A few medium-mouthed and small-mouthed diverticula                            were found in the descending colon and transverse                            colon.                           The exam was otherwise normal throughout the                            examined colon.                           The retroflexed view of the distal rectum and anal                            verge was normal and showed no anal or rectal                            abnormalities. Complications:            No immediate complications. Estimated Blood Loss:     Estimated blood loss was minimal. Impression:               - One 6 mm  polyp in the descending colon, removed                            with a cold snare. Resected and retrieved.                           - Mild diverticulosis in the descending colon and                            in the transverse colon.                           - The distal rectum and anal verge are normal on                            retroflexion view. Recommendation:           - Patient has a contact number available for                            emergencies. The signs and symptoms of  potential                            delayed complications were discussed with the                            patient. Return to normal activities tomorrow.                            Written discharge instructions were provided to the                            patient.                           - Resume previous diet.                           - Continue present medications.                           - Await pathology results.                           - Repeat colonoscopy (date not yet determined) for                            surveillance based on pathology results. Raphael Fitzpatrick E. Stacia, MD 05/17/2024 12:13:27 PM This report has been signed electronically.

## 2024-05-17 NOTE — Progress Notes (Signed)
 Pt's states no medical or surgical changes since previsit or office visit.

## 2024-05-18 ENCOUNTER — Telehealth: Payer: Self-pay

## 2024-05-18 ENCOUNTER — Ambulatory Visit
Admission: RE | Admit: 2024-05-18 | Discharge: 2024-05-18 | Disposition: A | Source: Ambulatory Visit | Attending: Acute Care | Admitting: Acute Care

## 2024-05-18 DIAGNOSIS — Z122 Encounter for screening for malignant neoplasm of respiratory organs: Secondary | ICD-10-CM

## 2024-05-18 DIAGNOSIS — R911 Solitary pulmonary nodule: Secondary | ICD-10-CM

## 2024-05-18 DIAGNOSIS — Z87891 Personal history of nicotine dependence: Secondary | ICD-10-CM

## 2024-05-18 NOTE — Telephone Encounter (Signed)
°  Follow up Call-     05/17/2024   10:25 AM  Call back number  Post procedure Call Back phone  # [phone]  Permission to leave phone message Yes     Patient questions:  Do you have a fever, pain , or abdominal swelling? No. Pain Score  0 *  Have you tolerated food without any problems? Yes.    Have you been able to return to your normal activities? Yes.    Do you have any questions about your discharge instructions: Diet   No. Medications  No. Follow up visit  No.  Do you have questions or concerns about your Care? No.  Actions: * If pain score is 4 or above: No action needed, pain <4.

## 2024-05-21 LAB — SURGICAL PATHOLOGY

## 2024-05-24 ENCOUNTER — Encounter: Payer: Self-pay | Admitting: Emergency Medicine

## 2024-05-24 ENCOUNTER — Telehealth: Payer: Self-pay | Admitting: Acute Care

## 2024-05-24 ENCOUNTER — Ambulatory Visit: Admitting: Emergency Medicine

## 2024-05-24 ENCOUNTER — Ambulatory Visit: Payer: Self-pay | Admitting: Gastroenterology

## 2024-05-24 ENCOUNTER — Other Ambulatory Visit: Payer: Self-pay

## 2024-05-24 ENCOUNTER — Other Ambulatory Visit

## 2024-05-24 VITALS — BP 120/80 | HR 102 | Ht 70.0 in | Wt 320.4 lb

## 2024-05-24 DIAGNOSIS — G4733 Obstructive sleep apnea (adult) (pediatric): Secondary | ICD-10-CM

## 2024-05-24 DIAGNOSIS — Z122 Encounter for screening for malignant neoplasm of respiratory organs: Secondary | ICD-10-CM

## 2024-05-24 DIAGNOSIS — Z87891 Personal history of nicotine dependence: Secondary | ICD-10-CM

## 2024-05-24 DIAGNOSIS — R911 Solitary pulmonary nodule: Secondary | ICD-10-CM

## 2024-05-24 DIAGNOSIS — J431 Panlobular emphysema: Secondary | ICD-10-CM

## 2024-05-24 MED ORDER — ALBUTEROL SULFATE HFA 108 (90 BASE) MCG/ACT IN AERS: 2.0000 | INHALATION_SPRAY | Freq: Four times a day (QID) | RESPIRATORY_TRACT | 6 refills | Status: AC | PRN

## 2024-05-24 NOTE — Telephone Encounter (Signed)
 Order placed under [place], NP. Results and plan to PCP.

## 2024-05-24 NOTE — Assessment & Plan Note (Signed)
 She got her CPAP machine 3 weeks ago and is just starting to try to get used to it.  She will follow with neurology

## 2024-05-24 NOTE — Progress Notes (Signed)
 Lorraine Holt,  The polyp which I removed during your recent procedure was proven to be completely benign but is considered a pre-cancerous polyp that MAY have grown into cancer if it had not been removed.  Studies shows that at least 20% of women over age 60 and 30% of men over age 77 have pre-cancerous polyps.  Based on current nationally recognized surveillance guidelines, I recommend that you have a repeat colonoscopy in 7 years.   If you develop any new rectal bleeding, abdominal pain or significant bowel habit changes, please contact me before then.

## 2024-05-24 NOTE — Assessment & Plan Note (Signed)
 Right upper lobe mixed density nodule is biopsy negative and CT chest stable.  The left ground glass nodule and in the 7 mm right lower lobe nodule have both resolved.  Next scan will be in June 2026.

## 2024-05-24 NOTE — Progress Notes (Signed)
 Subjective:    Patient ID: Lorraine Holt, female    DOB: 1966-01-10, 58 y.o.   MRN: [id number]  COPD Her past medical history is significant for COPD.   58 year old former smoker (34 pack years) with a history of COPD/chronic bronchitis, diabetes, PVD, OSA.  She has been seen in our office in the past by Dr. Shellia.  I saw her in 11/2023 for navigational bronchoscopy to evaluate a suspicious right upper lobe pulmonary nodule that had changed slightly on serial imaging.  Her cytology showed macrophages and no evidence of malignancy.  Cultures were negative with the exception of few Prevotella (beta-lactamase positive)-she was treated with doxycycline  for this post procedure.  Repeat CT chest 02/23/2024 showed the existing nodule to be stable as below, new ground glass nodule in medial left upper lobe, new peripheral right lower lobe nodule. Today she reports that she does get some exertional SOB, does have some cough.  Not currently on BD. She has OSA and is working to get on CPAP w Dr Vear.   CT scan of the chest 02/23/2024 reviewed by me showed no mediastinal adenopathy, mild centrilobular and paraseptal emphysema, unchanged lateral right apical pulmonary cyst with mural thickening and ground glass density.  Interval new 6.8 mm nodule in the peripheral right lower lobe and ground glass nodule in the medial left upper lobe.   ROV 05/24/2024 --follow-up visit 58 year old woman with COPD/chronic bronchitis, OSA, right upper lobe pulmonary nodule which I biopsied 11/2023 without any evidence for malignancy.  We did identify Prevotella which we treated.  The right upper lobe nodule has been stable but there was a new 7 mm nodule in the right lower lobe and a ground glass nodule in the medial left upper lobe on her serial imaging.  Follow-up as below.  I did a trial of Spiriva  Respimat at her last visit to see if she would get benefit. She reports that the Spiriva  was helpful - not as congested, better sleep.  She wants to continue. She got the CPAP 3 weeks ago and is starting to use it. Going to follow w Neuro for this.   CT chest done 05/18/2024 reviewed by me, shows that the right lower lobe 7 mm nodule has resolved.  The right upper lobe focus has not changed.  The left upper lobe ground glass nodule is not visualized on this study.  There were no new nodules seen.   Review of Systems As per HPI  Past Medical History:  Diagnosis Date   Anemia    history age 17'S   Arthritis    [employer] 02/24/2020   Bronchitis    Carpal tunnel syndrome of right wrist    COPD (chronic obstructive pulmonary disease) (HCC)    Diabetes mellitus without complication (HCC)    [employer] 02/24/2020   Enlarged heart    per patient follow with cardiology   Hyperlipidemia    Hypertension    Obesity    Peripheral vascular disease    undiagnosis, but complains of numbness in legs   Shortness of breath dyspnea    with exertion   Sleep apnea    [employer] 02/24/2020   Tooth abscess 09/30/2014   tooth was extracted     Family History  Problem Relation Age of Onset   Hypertension Mother    Diabetes Mother    Breast cancer Mother    Diabetes Maternal Aunt    Breast cancer Maternal Aunt    Breast cancer Maternal Aunt  Esophageal cancer Maternal Uncle    Diabetes Maternal Uncle    COPD Neg Hx    Early death Neg Hx    Heart disease Neg Hx    Hyperlipidemia Neg Hx    Kidney disease Neg Hx    Stroke Neg Hx    Colon cancer Neg Hx    Rectal cancer Neg Hx    Stomach cancer Neg Hx      Social History   Socioeconomic History   Marital status: Married    Spouse name: Not on file   Number of children: 4   Years of education: Not on file   Highest education level: Some college, no degree  Occupational History   Occupation: [employer]  Tobacco Use   Smoking status: Former    Current packs/day: 1.00    Types: Cigarettes    Passive exposure: Past   Smokeless tobacco: Never   Tobacco  comments:    Pt quit smoking the end of January 2025-08/23/2023    Pt. With a 32 pack year smoking history  Vaping Use   Vaping status: Never Used  Substance and Sexual Activity   Alcohol  use: No   Drug use: No   Sexual activity: Never    Birth control/protection: Surgical  Other Topics Concern   Not on file  Social History Narrative   Pt lives husband    Pt works    Social Drivers of Health   Tobacco Use: Medium Risk (05/24/2024)   Patient History    Smoking Tobacco Use: Former    Smokeless Tobacco Use: Never    Passive Exposure: Past  Physicist, Medical Strain: Low Risk (02/22/2024)   Overall Financial Resource Strain (CARDIA)    Difficulty of Paying Living Expenses: Not hard at all  Food Insecurity: No Food Insecurity (02/22/2024)   Epic    Worried About Programme Researcher, Broadcasting/film/video in the Last Year: Never true    Ran Out of Food in the Last Year: Never true  Transportation Needs: No Transportation Needs (02/22/2024)   Epic    Lack of Transportation (Medical): No    Lack of Transportation (Non-Medical): No  Physical Activity: Insufficiently Active (02/22/2024)   Exercise Vital Sign    Days of Exercise per Week: 1 day    Minutes of Exercise per Session: 10 min  Stress: No Stress Concern Present (02/22/2024)   [employer] of Occupational Health - Occupational Stress Questionnaire    Feeling of Stress: Not at all  Social Connections: Socially Integrated (02/22/2024)   Social Connection and Isolation Panel    Frequency of Communication with Friends and Family: Three times a week    Frequency of Social Gatherings with Friends and Family: Once a week    Attends Religious Services: More than 4 times per year    Active Member of [employer] or Organizations: Yes    Attends Banker Meetings: More than 4 times per year    Marital Status: Married  Catering Manager Violence: Not on file  Depression (PHQ2-9): Low Risk (08/11/2023)   Depression (PHQ2-9)    PHQ-2 Score: 0  Alcohol   Screen: Not on file  Housing: Low Risk (02/22/2024)   Epic    Unable to Pay for Housing in the Last Year: No    Number of Times Moved in the Last Year: 0    Homeless in the Last Year: No  Utilities: Not on file  Health Literacy: Not on file     Allergies  Allergen  Reactions   Clindamycin /Lincomycin Swelling    Tongue swelled   Penicillins Swelling and Rash   Amlodipine      Constipation, dizziness   Jardiance  [Empagliflozin ] Other (See Comments)    Vaginal irritation     Outpatient Medications Prior to Visit  Medication Sig Dispense Refill   acetaminophen  (TYLENOL ) 500 MG tablet Take 500 mg by mouth 2 (two) times daily.     aspirin  EC 81 MG tablet Take 81 mg by mouth daily. Swallow whole.     Blood Glucose Monitoring Suppl DEVI 1 each by Does not apply route as directed. Dispense based on patient and insurance preference. Use up to four times daily as directed. (FOR ICD-10 E10.9, E11.9). 1 each 0   carvedilol  (COREG ) 3.125 MG tablet TAKE 1 TABLET BY MOUTH TWICE DAILY WITH A MEAL 180 tablet 0   Dulaglutide  (TRULICITY ) 1.5 MG/0.5ML SOAJ Inject 1.5 mg into the skin once a week. 6 mL 1   Evolocumab  (REPATHA  SURECLICK) 140 MG/ML SOAJ INJECT 140 MG INTO THE SKIN EVERY 14 DAYS 2 mL 11   ezetimibe  (ZETIA ) 10 MG tablet Take 1 tablet by mouth once daily 90 tablet 0   Fluocinolone  Acetonide (DERMOTIC ) 0.01 % OIL Place 2 drops in ear(s) in the morning and at bedtime for 10 days. 20 mL 2   Glucose Blood (BLOOD GLUCOSE TEST STRIPS) STRP 1 each by Does not apply route as directed. Dispense based on patient and insurance preference. Use up to four times daily as directed. (FOR ICD-10 E10.9, E11.9). 100 strip 0   indapamide  (LOZOL ) 1.25 MG tablet Take 1 tablet (1.25 mg total) by mouth daily. 90 tablet 1   lamoTRIgine  (LAMICTAL ) 25 MG tablet Take 2 tablets by mouth twice daily 120 tablet 11   Lancet Device MISC 1 each by Does not apply route as directed. Dispense based on patient and insurance  preference. Use up to four times daily as directed. (FOR ICD-10 E10.9, E11.9). 1 each 0   Lancets MISC 1 each by Does not apply route as directed. Dispense based on patient and insurance preference. Use up to four times daily as directed. (FOR ICD-10 E10.9, E11.9). 100 each 0   lubiprostone  (AMITIZA ) 24 MCG capsule Take 1 capsule (24 mcg total) by mouth 2 (two) times daily with a meal. 180 capsule 0   metFORMIN  (GLUCOPHAGE -XR) 750 MG 24 hr tablet Take 1 tablet by mouth once daily with breakfast 90 tablet 0   rosuvastatin  (CRESTOR ) 40 MG tablet Take 1 tablet (40 mg total) by mouth daily. 90 tablet 1   thiamine  (VITAMIN B-1) 50 MG tablet Take 1 tablet (50 mg total) by mouth daily. 90 tablet 1   Tiotropium Bromide (SPIRIVA  RESPIMAT) 2.5 MCG/ACT AERS Inhale 2 puffs into the lungs daily. 4 g 0   TRUEPLUS LANCETS 30G MISC USE TWICE DAILY 100 each 11   Wheat Dextrin (BENEFIBER PO) Take by mouth.     No facility-administered medications prior to visit.        Objective:   Physical Exam  Vitals:   05/24/24 0915  BP: 120/80  Pulse: (!) 102  SpO2: 95%  Weight: (!) 320 lb 6.4 oz (145.3 kg)  Height: 5' 10 (1.778 m)   Gen: Pleasant, well-nourished, in no distress,  normal affect  ENT: No lesions,  mouth clear,  oropharynx clear, no postnasal drip  Neck: No JVD, no stridor  Lungs: No use of accessory muscles, no crackles or wheezing on normal respiration, no wheeze on forced expiration  Cardiovascular: RRR, heart sounds normal, no murmur or gallops, no peripheral edema  Musculoskeletal: No deformities, no cyanosis or clubbing  Neuro: alert, awake, non focal  Skin: Warm, no lesions or rash      Assessment & Plan:   Panlobular emphysema (HCC) Continue Spiriva  2 puffs once daily. We will give your prescription for albuterol .  You can use 2 puffs up to every 4 hours if needed for shortness of breath, chest tightness, wheezing. We will get your pulmonary function testing  scheduled  OSA (obstructive sleep apnea) She got her CPAP machine 3 weeks ago and is just starting to try to get used to it.  She will follow with neurology  Lung nodule seen on imaging study Right upper lobe mixed density nodule is biopsy negative and CT chest stable.  The left ground glass nodule and in the 7 mm right lower lobe nodule have both resolved.  Next scan will be in June 2026.   I personally spent a total of 23 minutes in the care of the patient today including preparing to see the patient, getting/reviewing separately obtained history, performing a medically appropriate exam/evaluation, counseling and educating, placing orders, documenting clinical information in the EHR, independently interpreting results, and communicating results.   Lamar Chris, MD, PhD 05/24/2024, 9:40 AM [facility] [phone] or if no answer before 7:00PM call [phone] For any issues after 7:00PM please call eLink [phone]

## 2024-05-24 NOTE — Patient Instructions (Signed)
 We reviewed your CT scan of the chest today.  This showed a stable right upper lobe pulmonary nodule and resolution of the other nodules that we need to follow.  This is good news. We will repeat your CT scan of the chest in June 2026 Continue Spiriva  2 puffs once daily. We will give your prescription for albuterol .  You can use 2 puffs up to every 4 hours if needed for shortness of breath, chest tightness, wheezing. We will get your pulmonary function testing scheduled Keep up the good work with your CPAP and follow-up with neurology as planned. Follow Dr. Shelah in June after your CT so we can review those results.  Please call if you have any respiratory issues or problems so we can see you sooner.

## 2024-05-24 NOTE — Assessment & Plan Note (Signed)
 Continue Spiriva  2 puffs once daily. We will give your prescription for albuterol .  You can use 2 puffs up to every 4 hours if needed for shortness of breath, chest tightness, wheezing. We will get your pulmonary function testing scheduled

## 2024-05-24 NOTE — Telephone Encounter (Signed)
 Scan was read as a LR 4 A.Pt. was seen by Dr. Shelah today in the clinic, and he personally reviewed imaging of the right upper lobe mixed density nodule ,which was  biopsy negative and LDCT chest stable. The left ground glass nodule and in the 7 mm right lower lobe nodule have both resolved. Per Dr. Shelah, after reviewing the scan, next scan will be in June 2026. 6 month follow up. Please fax results to PCP and let them know plan of care. Thanks so much Coronary artery calcifications and aortic atherosclerosis.  On a statin, followed by cards. Dr. Shelah placed the order for the follow up scan, we will need to order under my name. Thanks so much

## 2024-06-06 ENCOUNTER — Ambulatory Visit: Admitting: Emergency Medicine

## 2024-06-06 ENCOUNTER — Ambulatory Visit: Admitting: Acute Care

## 2024-06-10 ENCOUNTER — Other Ambulatory Visit: Payer: Self-pay | Admitting: Internal Medicine

## 2024-06-10 DIAGNOSIS — K5904 Chronic idiopathic constipation: Secondary | ICD-10-CM

## 2024-06-14 ENCOUNTER — Ambulatory Visit: Admitting: Emergency Medicine

## 2024-06-20 ENCOUNTER — Other Ambulatory Visit: Payer: Self-pay | Admitting: Internal Medicine

## 2024-06-20 DIAGNOSIS — E118 Type 2 diabetes mellitus with unspecified complications: Secondary | ICD-10-CM

## 2024-06-29 ENCOUNTER — Other Ambulatory Visit (HOSPITAL_COMMUNITY): Payer: Self-pay

## 2024-06-29 ENCOUNTER — Telehealth: Payer: Self-pay | Admitting: Pharmacy Technician

## 2024-06-29 NOTE — Telephone Encounter (Signed)
 Pharmacy Patient Advocate Encounter  Received notification from [employer] that Prior Authorization for Trulicity  1.5MG /0.5ML auto-injectors has been APPROVED from 06/29/2024 to 06/29/2025.   PA #/Case ID/Reference #: [id number]

## 2024-06-29 NOTE — Telephone Encounter (Signed)
 Pharmacy Patient Advocate Encounter   Received notification from [employer] that prior authorization for Trulicity  1.5MG /0.5ML auto-injectors is required/requested.   Insurance verification completed.   The patient is insured through [employer].   Per test claim: PA required; PA submitted to above mentioned insurance via Latent Key/confirmation #/EOC [id number] Status is pending

## 2024-08-22 ENCOUNTER — Ambulatory Visit: Admitting: Internal Medicine

## 2024-10-18 ENCOUNTER — Ambulatory Visit: Admitting: Neurology

## 2024-11-05 ENCOUNTER — Encounter
